# Patient Record
Sex: Female | Born: 1945 | Race: Black or African American | Hispanic: No | Smoking: Never smoker
Health system: Southern US, Community
[De-identification: ages and names within clinical notes are randomized; demographics above are authoritative.]

## PROBLEM LIST (undated history)

## (undated) DIAGNOSIS — M059 Rheumatoid arthritis with rheumatoid factor, unspecified: Secondary | ICD-10-CM

## (undated) DIAGNOSIS — M359 Systemic involvement of connective tissue, unspecified: Secondary | ICD-10-CM

## (undated) DIAGNOSIS — M1990 Unspecified osteoarthritis, unspecified site: Secondary | ICD-10-CM

## (undated) DIAGNOSIS — I209 Angina pectoris, unspecified: Secondary | ICD-10-CM

## (undated) DIAGNOSIS — Z9889 Other specified postprocedural states: Secondary | ICD-10-CM

## (undated) DIAGNOSIS — R0600 Dyspnea, unspecified: Secondary | ICD-10-CM

## (undated) DIAGNOSIS — E785 Hyperlipidemia, unspecified: Secondary | ICD-10-CM

## (undated) DIAGNOSIS — F419 Anxiety disorder, unspecified: Secondary | ICD-10-CM

## (undated) DIAGNOSIS — K219 Gastro-esophageal reflux disease without esophagitis: Secondary | ICD-10-CM

## (undated) DIAGNOSIS — R0601 Orthopnea: Secondary | ICD-10-CM

## (undated) DIAGNOSIS — I2510 Atherosclerotic heart disease of native coronary artery without angina pectoris: Secondary | ICD-10-CM

## (undated) DIAGNOSIS — Z90710 Acquired absence of both cervix and uterus: Secondary | ICD-10-CM

## (undated) DIAGNOSIS — I639 Cerebral infarction, unspecified: Secondary | ICD-10-CM

## (undated) DIAGNOSIS — Z87442 Personal history of urinary calculi: Secondary | ICD-10-CM

## (undated) DIAGNOSIS — E119 Type 2 diabetes mellitus without complications: Secondary | ICD-10-CM

## (undated) DIAGNOSIS — M545 Low back pain, unspecified: Secondary | ICD-10-CM

## (undated) DIAGNOSIS — C50919 Malignant neoplasm of unspecified site of unspecified female breast: Secondary | ICD-10-CM

## (undated) DIAGNOSIS — R011 Cardiac murmur, unspecified: Secondary | ICD-10-CM

## (undated) DIAGNOSIS — I10 Essential (primary) hypertension: Secondary | ICD-10-CM

## (undated) DIAGNOSIS — J349 Unspecified disorder of nose and nasal sinuses: Secondary | ICD-10-CM

## (undated) DIAGNOSIS — I219 Acute myocardial infarction, unspecified: Secondary | ICD-10-CM

## (undated) DIAGNOSIS — Z972 Presence of dental prosthetic device (complete) (partial): Secondary | ICD-10-CM

## (undated) DIAGNOSIS — Z9221 Personal history of antineoplastic chemotherapy: Secondary | ICD-10-CM

## (undated) HISTORY — PX: EYE SURGERY: SHX253

## (undated) HISTORY — PX: KNEE ARTHROSCOPY W/ MENISCAL REPAIR: SHX1877

## (undated) HISTORY — PX: ABDOMINAL HYSTERECTOMY: SHX81

## (undated) HISTORY — PX: JOINT REPLACEMENT: SHX530

## (undated) HISTORY — PX: COLONOSCOPY: SHX174

## (undated) HISTORY — PX: BREAST SURGERY: SHX581

## (undated) HISTORY — PX: CORONARY ANGIOPLASTY: SHX604

## (undated) HISTORY — PX: OTHER SURGICAL HISTORY: SHX169

---

## 1998-04-07 DIAGNOSIS — Z9221 Personal history of antineoplastic chemotherapy: Secondary | ICD-10-CM

## 1998-04-07 DIAGNOSIS — C50919 Malignant neoplasm of unspecified site of unspecified female breast: Secondary | ICD-10-CM

## 1998-04-07 HISTORY — PX: MASTECTOMY: SHX3

## 1998-04-07 HISTORY — DX: Malignant neoplasm of unspecified site of unspecified female breast: C50.919

## 1998-04-07 HISTORY — DX: Personal history of antineoplastic chemotherapy: Z92.21

## 2004-04-07 DIAGNOSIS — I639 Cerebral infarction, unspecified: Secondary | ICD-10-CM

## 2004-04-07 HISTORY — DX: Cerebral infarction, unspecified: I63.9

## 2005-07-17 ENCOUNTER — Ambulatory Visit: Payer: Self-pay | Admitting: Urology

## 2005-07-31 ENCOUNTER — Ambulatory Visit: Payer: Self-pay | Admitting: Internal Medicine

## 2005-08-21 ENCOUNTER — Ambulatory Visit: Payer: Self-pay | Admitting: Urology

## 2005-09-03 ENCOUNTER — Ambulatory Visit: Payer: Self-pay | Admitting: Urology

## 2005-09-23 ENCOUNTER — Ambulatory Visit: Payer: Self-pay | Admitting: Internal Medicine

## 2005-11-17 ENCOUNTER — Ambulatory Visit: Payer: Self-pay | Admitting: Urology

## 2006-05-03 ENCOUNTER — Ambulatory Visit: Payer: Self-pay | Admitting: Cardiology

## 2006-05-03 ENCOUNTER — Inpatient Hospital Stay (HOSPITAL_COMMUNITY): Admission: EM | Admit: 2006-05-03 | Discharge: 2006-05-06 | Payer: Self-pay | Admitting: Emergency Medicine

## 2006-05-08 ENCOUNTER — Ambulatory Visit: Payer: Self-pay | Admitting: Cardiovascular Disease

## 2006-08-14 ENCOUNTER — Ambulatory Visit: Payer: Self-pay | Admitting: Internal Medicine

## 2006-09-25 ENCOUNTER — Ambulatory Visit: Payer: Self-pay | Admitting: Internal Medicine

## 2006-11-12 ENCOUNTER — Ambulatory Visit: Payer: Self-pay | Admitting: Gastroenterology

## 2007-09-27 ENCOUNTER — Ambulatory Visit: Payer: Self-pay | Admitting: Internal Medicine

## 2007-10-07 ENCOUNTER — Ambulatory Visit: Payer: Self-pay | Admitting: Urology

## 2007-10-13 ENCOUNTER — Ambulatory Visit: Payer: Self-pay | Admitting: Urology

## 2008-07-26 ENCOUNTER — Ambulatory Visit: Payer: Self-pay | Admitting: Urology

## 2008-09-27 ENCOUNTER — Ambulatory Visit: Payer: Self-pay | Admitting: Internal Medicine

## 2009-02-08 ENCOUNTER — Ambulatory Visit: Payer: Self-pay | Admitting: Rheumatology

## 2009-09-28 ENCOUNTER — Ambulatory Visit: Payer: Self-pay | Admitting: Internal Medicine

## 2010-04-07 HISTORY — PX: CARDIAC CATHETERIZATION: SHX172

## 2010-07-07 DIAGNOSIS — I219 Acute myocardial infarction, unspecified: Secondary | ICD-10-CM

## 2010-07-07 HISTORY — DX: Acute myocardial infarction, unspecified: I21.9

## 2010-08-03 ENCOUNTER — Inpatient Hospital Stay: Payer: Self-pay | Admitting: Internal Medicine

## 2010-09-12 ENCOUNTER — Encounter: Payer: Self-pay | Admitting: Cardiology

## 2010-10-01 ENCOUNTER — Ambulatory Visit: Payer: Self-pay | Admitting: Internal Medicine

## 2010-10-06 ENCOUNTER — Encounter: Payer: Self-pay | Admitting: Cardiology

## 2010-11-05 ENCOUNTER — Ambulatory Visit: Payer: Self-pay | Admitting: Internal Medicine

## 2010-11-06 ENCOUNTER — Encounter: Payer: Self-pay | Admitting: Cardiology

## 2010-11-28 ENCOUNTER — Observation Stay: Payer: Self-pay | Admitting: Internal Medicine

## 2010-12-07 ENCOUNTER — Encounter: Payer: Self-pay | Admitting: Cardiology

## 2011-01-06 ENCOUNTER — Encounter: Payer: Self-pay | Admitting: Cardiology

## 2011-03-10 ENCOUNTER — Ambulatory Visit: Payer: Self-pay | Admitting: Internal Medicine

## 2011-10-08 ENCOUNTER — Ambulatory Visit: Payer: Self-pay | Admitting: Internal Medicine

## 2012-04-21 ENCOUNTER — Ambulatory Visit: Payer: Self-pay | Admitting: Gastroenterology

## 2012-04-22 LAB — PATHOLOGY REPORT

## 2012-05-18 ENCOUNTER — Emergency Department: Payer: Self-pay | Admitting: Emergency Medicine

## 2012-05-18 LAB — BASIC METABOLIC PANEL
Anion Gap: 8 (ref 7–16)
BUN: 9 mg/dL (ref 7–18)
Calcium, Total: 8.7 mg/dL (ref 8.5–10.1)
Glucose: 169 mg/dL — ABNORMAL HIGH (ref 65–99)
Osmolality: 273 (ref 275–301)
Potassium: 3.1 mmol/L — ABNORMAL LOW (ref 3.5–5.1)

## 2012-05-18 LAB — CBC
HCT: 35.6 % (ref 35.0–47.0)
MCH: 28.1 pg (ref 26.0–34.0)
MCHC: 35.2 g/dL (ref 32.0–36.0)
MCV: 80 fL (ref 80–100)
RDW: 13.7 % (ref 11.5–14.5)

## 2012-10-12 ENCOUNTER — Ambulatory Visit: Payer: Self-pay | Admitting: Internal Medicine

## 2012-12-09 ENCOUNTER — Ambulatory Visit: Payer: Self-pay | Admitting: Cardiology

## 2013-07-02 DIAGNOSIS — I10 Essential (primary) hypertension: Secondary | ICD-10-CM | POA: Insufficient documentation

## 2013-07-02 DIAGNOSIS — M76899 Other specified enthesopathies of unspecified lower limb, excluding foot: Secondary | ICD-10-CM | POA: Insufficient documentation

## 2013-07-02 DIAGNOSIS — M545 Low back pain, unspecified: Secondary | ICD-10-CM | POA: Insufficient documentation

## 2013-10-13 ENCOUNTER — Ambulatory Visit: Payer: Self-pay | Admitting: Internal Medicine

## 2013-11-16 DIAGNOSIS — R7611 Nonspecific reaction to tuberculin skin test without active tuberculosis: Secondary | ICD-10-CM | POA: Insufficient documentation

## 2013-11-16 DIAGNOSIS — M1990 Unspecified osteoarthritis, unspecified site: Secondary | ICD-10-CM | POA: Insufficient documentation

## 2014-03-14 DIAGNOSIS — I639 Cerebral infarction, unspecified: Secondary | ICD-10-CM | POA: Insufficient documentation

## 2014-04-24 DIAGNOSIS — J069 Acute upper respiratory infection, unspecified: Secondary | ICD-10-CM | POA: Diagnosis not present

## 2014-04-24 DIAGNOSIS — I10 Essential (primary) hypertension: Secondary | ICD-10-CM | POA: Diagnosis not present

## 2014-04-24 DIAGNOSIS — E119 Type 2 diabetes mellitus without complications: Secondary | ICD-10-CM | POA: Diagnosis not present

## 2014-04-24 DIAGNOSIS — M06072 Rheumatoid arthritis without rheumatoid factor, left ankle and foot: Secondary | ICD-10-CM | POA: Diagnosis not present

## 2014-05-09 DIAGNOSIS — E780 Pure hypercholesterolemia: Secondary | ICD-10-CM | POA: Diagnosis not present

## 2014-05-09 DIAGNOSIS — M150 Primary generalized (osteo)arthritis: Secondary | ICD-10-CM | POA: Diagnosis not present

## 2014-05-09 DIAGNOSIS — J0111 Acute recurrent frontal sinusitis: Secondary | ICD-10-CM | POA: Diagnosis not present

## 2014-05-16 DIAGNOSIS — R112 Nausea with vomiting, unspecified: Secondary | ICD-10-CM | POA: Diagnosis not present

## 2014-05-16 DIAGNOSIS — I10 Essential (primary) hypertension: Secondary | ICD-10-CM | POA: Diagnosis not present

## 2014-05-16 DIAGNOSIS — J0101 Acute recurrent maxillary sinusitis: Secondary | ICD-10-CM | POA: Diagnosis not present

## 2014-05-16 DIAGNOSIS — M150 Primary generalized (osteo)arthritis: Secondary | ICD-10-CM | POA: Diagnosis not present

## 2014-06-14 DIAGNOSIS — H01009 Unspecified blepharitis unspecified eye, unspecified eyelid: Secondary | ICD-10-CM | POA: Diagnosis not present

## 2014-06-14 DIAGNOSIS — H16223 Keratoconjunctivitis sicca, not specified as Sjogren's, bilateral: Secondary | ICD-10-CM | POA: Diagnosis not present

## 2014-07-08 DIAGNOSIS — R300 Dysuria: Secondary | ICD-10-CM | POA: Diagnosis not present

## 2014-07-08 DIAGNOSIS — N760 Acute vaginitis: Secondary | ICD-10-CM | POA: Diagnosis not present

## 2014-08-01 DIAGNOSIS — G8929 Other chronic pain: Secondary | ICD-10-CM | POA: Diagnosis not present

## 2014-08-01 DIAGNOSIS — E782 Mixed hyperlipidemia: Secondary | ICD-10-CM | POA: Diagnosis not present

## 2014-08-01 DIAGNOSIS — N898 Other specified noninflammatory disorders of vagina: Secondary | ICD-10-CM | POA: Diagnosis not present

## 2014-08-01 DIAGNOSIS — M25561 Pain in right knee: Secondary | ICD-10-CM | POA: Diagnosis not present

## 2014-08-01 DIAGNOSIS — I10 Essential (primary) hypertension: Secondary | ICD-10-CM | POA: Diagnosis not present

## 2014-08-31 DIAGNOSIS — Z0000 Encounter for general adult medical examination without abnormal findings: Secondary | ICD-10-CM | POA: Diagnosis not present

## 2014-08-31 DIAGNOSIS — R300 Dysuria: Secondary | ICD-10-CM | POA: Diagnosis not present

## 2014-08-31 DIAGNOSIS — I10 Essential (primary) hypertension: Secondary | ICD-10-CM | POA: Diagnosis not present

## 2014-08-31 DIAGNOSIS — E780 Pure hypercholesterolemia: Secondary | ICD-10-CM | POA: Diagnosis not present

## 2014-08-31 DIAGNOSIS — M150 Primary generalized (osteo)arthritis: Secondary | ICD-10-CM | POA: Diagnosis not present

## 2014-08-31 DIAGNOSIS — E119 Type 2 diabetes mellitus without complications: Secondary | ICD-10-CM | POA: Diagnosis not present

## 2014-08-31 DIAGNOSIS — J0111 Acute recurrent frontal sinusitis: Secondary | ICD-10-CM | POA: Diagnosis not present

## 2014-09-07 ENCOUNTER — Other Ambulatory Visit: Payer: Self-pay | Admitting: Internal Medicine

## 2014-09-07 DIAGNOSIS — N898 Other specified noninflammatory disorders of vagina: Secondary | ICD-10-CM | POA: Diagnosis not present

## 2014-09-07 DIAGNOSIS — R300 Dysuria: Secondary | ICD-10-CM | POA: Diagnosis not present

## 2014-09-07 DIAGNOSIS — Z0000 Encounter for general adult medical examination without abnormal findings: Secondary | ICD-10-CM | POA: Diagnosis not present

## 2014-09-07 DIAGNOSIS — I10 Essential (primary) hypertension: Secondary | ICD-10-CM | POA: Diagnosis not present

## 2014-09-07 DIAGNOSIS — Z1231 Encounter for screening mammogram for malignant neoplasm of breast: Secondary | ICD-10-CM

## 2014-09-07 DIAGNOSIS — M159 Polyosteoarthritis, unspecified: Secondary | ICD-10-CM | POA: Diagnosis not present

## 2014-09-14 DIAGNOSIS — Z780 Asymptomatic menopausal state: Secondary | ICD-10-CM | POA: Diagnosis not present

## 2014-09-14 DIAGNOSIS — I2510 Atherosclerotic heart disease of native coronary artery without angina pectoris: Secondary | ICD-10-CM | POA: Diagnosis not present

## 2014-09-14 DIAGNOSIS — R0602 Shortness of breath: Secondary | ICD-10-CM | POA: Diagnosis not present

## 2014-09-14 DIAGNOSIS — M159 Polyosteoarthritis, unspecified: Secondary | ICD-10-CM | POA: Diagnosis not present

## 2014-09-14 DIAGNOSIS — I10 Essential (primary) hypertension: Secondary | ICD-10-CM | POA: Diagnosis not present

## 2014-09-14 DIAGNOSIS — R079 Chest pain, unspecified: Secondary | ICD-10-CM | POA: Diagnosis not present

## 2014-09-28 DIAGNOSIS — R0602 Shortness of breath: Secondary | ICD-10-CM | POA: Diagnosis not present

## 2014-10-03 DIAGNOSIS — I2510 Atherosclerotic heart disease of native coronary artery without angina pectoris: Secondary | ICD-10-CM | POA: Diagnosis not present

## 2014-10-03 DIAGNOSIS — E780 Pure hypercholesterolemia: Secondary | ICD-10-CM | POA: Diagnosis not present

## 2014-10-03 DIAGNOSIS — I6310 Cerebral infarction due to embolism of unspecified precerebral artery: Secondary | ICD-10-CM | POA: Diagnosis not present

## 2014-10-03 DIAGNOSIS — I10 Essential (primary) hypertension: Secondary | ICD-10-CM | POA: Diagnosis not present

## 2014-10-05 DIAGNOSIS — R300 Dysuria: Secondary | ICD-10-CM | POA: Diagnosis not present

## 2014-10-16 ENCOUNTER — Ambulatory Visit
Admission: RE | Admit: 2014-10-16 | Discharge: 2014-10-16 | Disposition: A | Discharge status: Home / Self Care | Payer: Commercial Managed Care - HMO | Source: Ambulatory Visit | Attending: Internal Medicine | Admitting: Internal Medicine

## 2014-10-16 ENCOUNTER — Other Ambulatory Visit: Payer: Self-pay | Admitting: Internal Medicine

## 2014-10-16 DIAGNOSIS — Z1231 Encounter for screening mammogram for malignant neoplasm of breast: Secondary | ICD-10-CM

## 2014-10-16 HISTORY — DX: Malignant neoplasm of unspecified site of unspecified female breast: C50.919

## 2014-10-26 DIAGNOSIS — Z4431 Encounter for fitting and adjustment of external right breast prosthesis: Secondary | ICD-10-CM | POA: Diagnosis not present

## 2014-10-26 DIAGNOSIS — C50111 Malignant neoplasm of central portion of right female breast: Secondary | ICD-10-CM | POA: Diagnosis not present

## 2014-12-28 DIAGNOSIS — M069 Rheumatoid arthritis, unspecified: Secondary | ICD-10-CM | POA: Diagnosis not present

## 2014-12-28 DIAGNOSIS — M5440 Lumbago with sciatica, unspecified side: Secondary | ICD-10-CM | POA: Diagnosis not present

## 2014-12-28 DIAGNOSIS — I10 Essential (primary) hypertension: Secondary | ICD-10-CM | POA: Diagnosis not present

## 2014-12-28 DIAGNOSIS — E119 Type 2 diabetes mellitus without complications: Secondary | ICD-10-CM | POA: Diagnosis not present

## 2014-12-28 DIAGNOSIS — R102 Pelvic and perineal pain: Secondary | ICD-10-CM | POA: Diagnosis not present

## 2014-12-28 DIAGNOSIS — R300 Dysuria: Secondary | ICD-10-CM | POA: Diagnosis not present

## 2014-12-28 DIAGNOSIS — Z23 Encounter for immunization: Secondary | ICD-10-CM | POA: Diagnosis not present

## 2014-12-28 DIAGNOSIS — F419 Anxiety disorder, unspecified: Secondary | ICD-10-CM | POA: Diagnosis not present

## 2014-12-29 ENCOUNTER — Other Ambulatory Visit: Payer: Self-pay | Admitting: Internal Medicine

## 2014-12-29 DIAGNOSIS — R102 Pelvic and perineal pain: Secondary | ICD-10-CM

## 2014-12-29 DIAGNOSIS — R300 Dysuria: Secondary | ICD-10-CM

## 2014-12-29 DIAGNOSIS — M5441 Lumbago with sciatica, right side: Secondary | ICD-10-CM

## 2014-12-29 DIAGNOSIS — M5442 Lumbago with sciatica, left side: Secondary | ICD-10-CM

## 2015-01-02 ENCOUNTER — Encounter: Payer: Self-pay | Admitting: Emergency Medicine

## 2015-01-02 ENCOUNTER — Emergency Department
Admission: EM | Admit: 2015-01-02 | Discharge: 2015-01-02 | Disposition: A | Discharge status: Home / Self Care | Payer: Commercial Managed Care - HMO | Attending: Emergency Medicine | Admitting: Emergency Medicine

## 2015-01-02 ENCOUNTER — Ambulatory Visit: Payer: Commercial Managed Care - HMO

## 2015-01-02 ENCOUNTER — Emergency Department: Payer: Commercial Managed Care - HMO

## 2015-01-02 ENCOUNTER — Ambulatory Visit
Admission: RE | Admit: 2015-01-02 | Discharge: 2015-01-02 | Disposition: A | Discharge status: Home / Self Care | Payer: Commercial Managed Care - HMO | Source: Ambulatory Visit | Attending: Internal Medicine | Admitting: Internal Medicine

## 2015-01-02 DIAGNOSIS — R109 Unspecified abdominal pain: Secondary | ICD-10-CM | POA: Diagnosis not present

## 2015-01-02 DIAGNOSIS — N390 Urinary tract infection, site not specified: Secondary | ICD-10-CM | POA: Diagnosis not present

## 2015-01-02 DIAGNOSIS — I2510 Atherosclerotic heart disease of native coronary artery without angina pectoris: Secondary | ICD-10-CM

## 2015-01-02 DIAGNOSIS — M5441 Lumbago with sciatica, right side: Secondary | ICD-10-CM

## 2015-01-02 DIAGNOSIS — R300 Dysuria: Secondary | ICD-10-CM

## 2015-01-02 DIAGNOSIS — M549 Dorsalgia, unspecified: Secondary | ICD-10-CM | POA: Diagnosis present

## 2015-01-02 DIAGNOSIS — R102 Pelvic and perineal pain: Secondary | ICD-10-CM

## 2015-01-02 DIAGNOSIS — I10 Essential (primary) hypertension: Secondary | ICD-10-CM | POA: Diagnosis not present

## 2015-01-02 DIAGNOSIS — E119 Type 2 diabetes mellitus without complications: Secondary | ICD-10-CM | POA: Insufficient documentation

## 2015-01-02 DIAGNOSIS — K5730 Diverticulosis of large intestine without perforation or abscess without bleeding: Secondary | ICD-10-CM | POA: Insufficient documentation

## 2015-01-02 DIAGNOSIS — G8929 Other chronic pain: Secondary | ICD-10-CM | POA: Diagnosis not present

## 2015-01-02 DIAGNOSIS — N2889 Other specified disorders of kidney and ureter: Secondary | ICD-10-CM | POA: Insufficient documentation

## 2015-01-02 DIAGNOSIS — E876 Hypokalemia: Secondary | ICD-10-CM | POA: Diagnosis not present

## 2015-01-02 DIAGNOSIS — M5442 Lumbago with sciatica, left side: Secondary | ICD-10-CM

## 2015-01-02 DIAGNOSIS — E871 Hypo-osmolality and hyponatremia: Secondary | ICD-10-CM | POA: Insufficient documentation

## 2015-01-02 DIAGNOSIS — R252 Cramp and spasm: Secondary | ICD-10-CM | POA: Diagnosis not present

## 2015-01-02 HISTORY — DX: Type 2 diabetes mellitus without complications: E11.9

## 2015-01-02 HISTORY — DX: Essential (primary) hypertension: I10

## 2015-01-02 HISTORY — DX: Systemic involvement of connective tissue, unspecified: M35.9

## 2015-01-02 LAB — URINALYSIS COMPLETE WITH MICROSCOPIC (ARMC ONLY)
BILIRUBIN URINE: NEGATIVE
Bacteria, UA: NONE SEEN
HGB URINE DIPSTICK: NEGATIVE
Ketones, ur: NEGATIVE mg/dL
LEUKOCYTES UA: NEGATIVE
NITRITE: NEGATIVE
Protein, ur: NEGATIVE mg/dL
SPECIFIC GRAVITY, URINE: 1.058 — AB (ref 1.005–1.030)
pH: 6 (ref 5.0–8.0)

## 2015-01-02 LAB — CBC WITH DIFFERENTIAL/PLATELET
BASOS PCT: 1 %
Basophils Absolute: 0 10*3/uL (ref 0–0.1)
EOS ABS: 0 10*3/uL (ref 0–0.7)
Eosinophils Relative: 0 %
HCT: 36.7 % (ref 35.0–47.0)
HEMOGLOBIN: 13 g/dL (ref 12.0–16.0)
LYMPHS ABS: 0.7 10*3/uL — AB (ref 1.0–3.6)
Lymphocytes Relative: 11 %
MCH: 28.3 pg (ref 26.0–34.0)
MCHC: 35.4 g/dL (ref 32.0–36.0)
MCV: 80 fL (ref 80.0–100.0)
Monocytes Absolute: 0.1 10*3/uL — ABNORMAL LOW (ref 0.2–0.9)
Monocytes Relative: 1 %
NEUTROS PCT: 87 %
Neutro Abs: 5.6 10*3/uL (ref 1.4–6.5)
Platelets: 227 10*3/uL (ref 150–440)
RBC: 4.59 MIL/uL (ref 3.80–5.20)
RDW: 14.3 % (ref 11.5–14.5)
WBC: 6.4 10*3/uL (ref 3.6–11.0)

## 2015-01-02 LAB — BASIC METABOLIC PANEL
ANION GAP: 13 (ref 5–15)
BUN: 11 mg/dL (ref 6–20)
CALCIUM: 8.9 mg/dL (ref 8.9–10.3)
CO2: 25 mmol/L (ref 22–32)
Chloride: 88 mmol/L — ABNORMAL LOW (ref 101–111)
Creatinine, Ser: 0.69 mg/dL (ref 0.44–1.00)
Glucose, Bld: 270 mg/dL — ABNORMAL HIGH (ref 65–99)
Potassium: 3.1 mmol/L — ABNORMAL LOW (ref 3.5–5.1)
SODIUM: 126 mmol/L — AB (ref 135–145)

## 2015-01-02 LAB — APTT: aPTT: 31 seconds (ref 24–36)

## 2015-01-02 LAB — PROTIME-INR
INR: 1.1
PROTHROMBIN TIME: 14.4 s (ref 11.4–15.0)

## 2015-01-02 LAB — POCT I-STAT CREATININE: Creatinine, Ser: 0.7 mg/dL (ref 0.44–1.00)

## 2015-01-02 MED ORDER — SODIUM CHLORIDE 0.9 % IV BOLUS (SEPSIS)
1000.0000 mL | Freq: Once | INTRAVENOUS | Status: AC
  Administered 2015-01-02: 1000 mL via INTRAVENOUS

## 2015-01-02 MED ORDER — POTASSIUM CHLORIDE CRYS ER 20 MEQ PO TBCR
40.0000 meq | EXTENDED_RELEASE_TABLET | Freq: Once | ORAL | Status: AC
  Administered 2015-01-02: 40 meq via ORAL
  Filled 2015-01-02: qty 2

## 2015-01-02 MED ORDER — IOHEXOL 300 MG/ML  SOLN
100.0000 mL | Freq: Once | INTRAMUSCULAR | Status: AC | PRN
  Administered 2015-01-02: 100 mL via INTRAVENOUS

## 2015-01-02 NOTE — ED Provider Notes (Signed)
[facility] Emergency Department Provider Note  ____________________________________________  Time seen: Approximately 9:28 PM  I have reviewed the triage vital signs and the nursing notes.   HISTORY  Chief Complaint Back Pain and Dizziness    HPI MARCA GADSBY is a 69 y.o. female with no history of DVT or PE and no symptoms of DVT or PE who had a CT scan for chronic abdominal pain which showed no acute pathology but did suggest there was a possibility of a DVT. She was sent here for DVT workup.She has had no legs pain or leg swelling no chest pain or shortness of breath or personal or family history of PE or DVT and no complaints at this time. She is hungry  Past Medical History  Diagnosis Date  . Breast cancer 2000    Right Breast - Chemotherapy  . Collagen vascular disease     Rheumatoid Arthritis.  . Diabetes mellitus without complication     Patient takes Metformin  . Hypertension     There are no active problems to display for this patient.   Past Surgical History  Procedure Laterality Date  . Mastectomy Right 2000    No current outpatient prescriptions on file.  Allergies Fish allergy and Iodine  Family History  Problem Relation Age of Onset  . Breast cancer Maternal Aunt     Social History Social History  Substance Use Topics  . Smoking status: Never Smoker   . Smokeless tobacco: None  . Alcohol Use: No    Review of Systems Constitutional: No fever/chills Eyes: No visual changes. ENT: No sore throat. No stiff neck no neck pain Cardiovascular: Denies chest pain. Respiratory: Denies shortness of breath. Gastrointestinal:   no vomiting.  No diarrhea.  No constipation. Genitourinary: Negative for dysuria. Musculoskeletal: Negative lower extremity swelling Skin: Negative for rash. Neurological: Negative for headaches, focal weakness or numbness. 10-point ROS otherwise  negative.  ____________________________________________   PHYSICAL EXAM:  VITAL SIGNS: ED Triage Vitals  Enc Vitals Group     BP 01/02/15 1759 138/65 mmHg     Pulse Rate 01/02/15 1759 71     Resp 01/02/15 1759 18     Temp 01/02/15 1759 98.7 F (37.1 C)     Temp Source 01/02/15 1759 Oral     SpO2 01/02/15 1759 95 %     Weight 01/02/15 1759 177 lb (80.287 kg)     Height 01/02/15 1759 5\' 3"  (1.6 m)     Head Cir --      Peak Flow --      Pain Score 01/02/15 1803 6     Pain Loc --      Pain Edu? --      Excl. in [place]? --     Constitutional: Alert and oriented. Well appearing and in no acute distress. Eyes: Conjunctivae are normal. PERRL. EOMI. Head: Atraumatic. Nose: No congestion/rhinnorhea. Mouth/Throat: Mucous membranes are moist.  Oropharynx non-erythematous. Neck: No stridor.   Nontender with no meningismus Cardiovascular: Normal rate, regular rhythm. Grossly normal heart sounds.  Good peripheral circulation. Respiratory: Normal respiratory effort.  No retractions. Lungs CTAB. Gastrointestinal: Soft and nontender. No distention. No abdominal bruits. Back:  There is no focal tenderness or step off there is no midline tenderness there are no lesions noted. there is no CVA tenderness Musculoskeletal: No lower extremity tenderness. No joint effusions, no DVT signs strong distal pulses no edema Neurologic:  Normal speech and language. No gross focal neurologic deficits are appreciated.  Skin:  Skin is warm, dry and intact. No rash noted. Psychiatric: Mood and affect are normal. Speech and behavior are normal.  ____________________________________________   LABS (all labs ordered are listed, but only abnormal results are displayed)  Labs Reviewed  CBC WITH DIFFERENTIAL/PLATELET - Abnormal; Notable for the following:    Lymphs Abs 0.7 (*)    Monocytes Absolute 0.1 (*)    All other components within normal limits  BASIC METABOLIC PANEL - Abnormal; Notable for the following:     Sodium 126 (*)    Potassium 3.1 (*)    Chloride 88 (*)    Glucose, Bld 270 (*)    All other components within normal limits  URINALYSIS COMPLETEWITH MICROSCOPIC ([facility] ONLY) - Abnormal; Notable for the following:    Color, Urine YELLOW (*)    APPearance CLEAR (*)    Glucose, UA >500 (*)    Specific Gravity, Urine 1.058 (*)    Squamous Epithelial / LPF 0-5 (*)    All other components within normal limits  PROTIME-INR  APTT   ____________________________________________  EKG   ____________________________________________  RADIOLOGY   ____________________________________________   PROCEDURES  Procedure(s) performed: None  Critical Care performed: None  ____________________________________________   INITIAL IMPRESSION / ASSESSMENT AND PLAN / ED COURSE  Pertinent labs & imaging results that were available during my care of the patient were reviewed by me and considered in my medical decision making (see chart for details).  Patient sent here for DVT rule out that is negative however we did check blood work. Her sugar is somewhat elevated and she states she will manage status, however her sodium is 126, corrected for her glucose and it is 1:30 however which is really not dangerous. We will give her a liter of IV fluid and she understands she must follow closely with her doctor in the next day or 2 for a recheck. ____________________________________________   FINAL CLINICAL IMPRESSION(S) / ED DIAGNOSES  Final diagnoses:  None     Schuyler Amor, MD 01/02/15 2134

## 2015-01-02 NOTE — ED Notes (Signed)
Pt states lower back pain/ discomfort at this time, vitals wnl. No acute distress noted.

## 2015-01-02 NOTE — ED Notes (Signed)
Pt to ed with c/o back pain, dizziness and nausea.  Pt had CT scan of ABD today and was called by md and told to come to ED to rule out DVT.

## 2015-01-02 NOTE — Discharge Instructions (Signed)
Hypokalemia Hypokalemia means that the amount of potassium in the blood is lower than normal.Potassium is a chemical, called an electrolyte, that helps regulate the amount of fluid in the body. It also stimulates muscle contraction and helps nerves function properly.Most of the body's potassium is inside of cells, and only a very small amount is in the blood. Because the amount in the blood is so small, minor changes can be life-threatening. CAUSES  Antibiotics.  Diarrhea or vomiting.  Using laxatives too much, which can cause diarrhea.  Chronic kidney disease.  Water pills (diuretics).  Eating disorders (bulimia).  Low magnesium level.  Sweating a lot. SIGNS AND SYMPTOMS  Weakness.  Constipation.  Fatigue.  Muscle cramps.  Mental confusion.  Skipped heartbeats or irregular heartbeat (palpitations).  Tingling or numbness. DIAGNOSIS  Your health care provider can diagnose hypokalemia with blood tests. In addition to checking your potassium level, your health care provider may also check other lab tests. TREATMENT Hypokalemia can be treated with potassium supplements taken by mouth or adjustments in your current medicines. If your potassium level is very low, you may need to get potassium through a vein (IV) and be monitored in the hospital. A diet high in potassium is also helpful. Foods high in potassium are:  Nuts, such as peanuts and pistachios.  Seeds, such as sunflower seeds and pumpkin seeds.  Peas, lentils, and lima beans.  Whole grain and bran cereals and breads.  Fresh fruit and vegetables, such as apricots, avocado, bananas, cantaloupe, kiwi, oranges, tomatoes, asparagus, and potatoes.  Orange and tomato juices.  Red meats.  Fruit yogurt. HOME CARE INSTRUCTIONS  Take all medicines as prescribed by your health care provider.  Maintain a healthy diet by including nutritious food, such as fruits, vegetables, nuts, whole grains, and lean meats.  If  you are taking a laxative, be sure to follow the directions on the label. SEEK MEDICAL CARE IF:  Your weakness gets worse.  You feel your heart pounding or racing.  You are vomiting or having diarrhea.  You are diabetic and having trouble keeping your blood glucose in the normal range. SEEK IMMEDIATE MEDICAL CARE IF:  You have chest pain, shortness of breath, or dizziness.  You are vomiting or having diarrhea for more than 2 days.  You faint. MAKE SURE YOU:   Understand these instructions.  Will watch your condition.  Will get help right away if you are not doing well or get worse. Document Released: 03/24/2005 Document Revised: 01/12/2013 Document Reviewed: 09/24/2012 [facility] Patient Information 2015 [place], [place]. This information is not intended to replace advice given to you by your health care provider. Make sure you discuss any questions you have with your health care provider.  Hyponatremia  Hyponatremia is when the amount of salt (sodium) in your blood is too low. When sodium levels are low, your cells will absorb extra water and swell. The swelling happens throughout the body, but it mostly affects the brain. Severe brain swelling (cerebral edema), seizures, or coma can happen.  CAUSES   Heart, kidney, or liver problems.  Thyroid problems.  Adrenal gland problems.  Severe vomiting and diarrhea.  Certain medicines or illegal drugs.  Dehydration.  Drinking too much water.  Low-sodium diet. SYMPTOMS   Nausea and vomiting.  Confusion.  Lethargy.  Agitation.  Headache.  Twitching or shaking (seizures).  Unconsciousness.  Appetite loss.  Muscle weakness and cramping. DIAGNOSIS  Hyponatremia is identified by a simple blood test. Your caregiver will perform a history  and physical exam to try to find the cause and type of hyponatremia. Other tests may be needed to measure the amount of sodium in your blood and urine. TREATMENT  Treatment will  depend on the cause.   Fluids may be given through the vein (IV).  Medicines may be used to correct the sodium imbalance. If medicines are causing the problem, they will need to be adjusted.  Water or fluid intake may be restricted to restore proper balance. The speed of correcting the sodium problem is very important. If the problem is corrected too fast, nerve damage (sometimes unchangeable) can happen. HOME CARE INSTRUCTIONS   Only take medicines as directed by your caregiver. Many medicines can make hyponatremia worse. Discuss all your medicines with your caregiver.  Carefully follow any recommended diet, including any fluid restrictions.  You may be asked to repeat lab tests. Follow these directions.  Avoid alcohol and recreational drugs. SEEK MEDICAL CARE IF:   You develop worsening nausea, fatigue, headache, confusion, or weakness.  Your original hyponatremia symptoms return.  You have problems following the recommended diet. SEEK IMMEDIATE MEDICAL CARE IF:   You have a seizure.  You faint.  You have ongoing diarrhea or vomiting. MAKE SURE YOU:   Understand these instructions.  Will watch your condition.  Will get help right away if you are not doing well or get worse. Document Released: 03/14/2002 Document Revised: 06/16/2011 Document Reviewed: 09/08/2010 [facility] Patient Information 2015 [place], [place]. This information is not intended to replace advice given to you by your health care provider. Make sure you discuss any questions you have with your health care provider.

## 2015-01-03 DIAGNOSIS — E780 Pure hypercholesterolemia: Secondary | ICD-10-CM | POA: Diagnosis not present

## 2015-01-03 DIAGNOSIS — I10 Essential (primary) hypertension: Secondary | ICD-10-CM | POA: Diagnosis not present

## 2015-01-03 DIAGNOSIS — I2510 Atherosclerotic heart disease of native coronary artery without angina pectoris: Secondary | ICD-10-CM | POA: Diagnosis not present

## 2015-01-03 DIAGNOSIS — I6310 Cerebral infarction due to embolism of unspecified precerebral artery: Secondary | ICD-10-CM | POA: Diagnosis not present

## 2015-02-06 DIAGNOSIS — M25522 Pain in left elbow: Secondary | ICD-10-CM | POA: Diagnosis not present

## 2015-02-06 DIAGNOSIS — E119 Type 2 diabetes mellitus without complications: Secondary | ICD-10-CM | POA: Diagnosis not present

## 2015-02-06 DIAGNOSIS — I10 Essential (primary) hypertension: Secondary | ICD-10-CM | POA: Diagnosis not present

## 2015-02-21 DIAGNOSIS — H16223 Keratoconjunctivitis sicca, not specified as Sjogren's, bilateral: Secondary | ICD-10-CM | POA: Diagnosis not present

## 2015-03-28 DIAGNOSIS — M1990 Unspecified osteoarthritis, unspecified site: Secondary | ICD-10-CM | POA: Diagnosis not present

## 2015-04-26 DIAGNOSIS — M9901 Segmental and somatic dysfunction of cervical region: Secondary | ICD-10-CM | POA: Diagnosis not present

## 2015-04-26 DIAGNOSIS — M531 Cervicobrachial syndrome: Secondary | ICD-10-CM | POA: Diagnosis not present

## 2015-04-26 DIAGNOSIS — M9902 Segmental and somatic dysfunction of thoracic region: Secondary | ICD-10-CM | POA: Diagnosis not present

## 2015-04-26 DIAGNOSIS — M5416 Radiculopathy, lumbar region: Secondary | ICD-10-CM | POA: Diagnosis not present

## 2015-05-01 DIAGNOSIS — M531 Cervicobrachial syndrome: Secondary | ICD-10-CM | POA: Diagnosis not present

## 2015-05-01 DIAGNOSIS — M9902 Segmental and somatic dysfunction of thoracic region: Secondary | ICD-10-CM | POA: Diagnosis not present

## 2015-05-01 DIAGNOSIS — M5416 Radiculopathy, lumbar region: Secondary | ICD-10-CM | POA: Diagnosis not present

## 2015-05-01 DIAGNOSIS — M9901 Segmental and somatic dysfunction of cervical region: Secondary | ICD-10-CM | POA: Diagnosis not present

## 2015-05-03 DIAGNOSIS — M05721 Rheumatoid arthritis with rheumatoid factor of right elbow without organ or systems involvement: Secondary | ICD-10-CM | POA: Diagnosis not present

## 2015-05-03 DIAGNOSIS — M05722 Rheumatoid arthritis with rheumatoid factor of left elbow without organ or systems involvement: Secondary | ICD-10-CM | POA: Insufficient documentation

## 2015-05-03 DIAGNOSIS — M9901 Segmental and somatic dysfunction of cervical region: Secondary | ICD-10-CM | POA: Diagnosis not present

## 2015-05-03 DIAGNOSIS — E118 Type 2 diabetes mellitus with unspecified complications: Secondary | ICD-10-CM | POA: Diagnosis not present

## 2015-05-03 DIAGNOSIS — M531 Cervicobrachial syndrome: Secondary | ICD-10-CM | POA: Diagnosis not present

## 2015-05-03 DIAGNOSIS — M791 Myalgia: Secondary | ICD-10-CM | POA: Diagnosis not present

## 2015-05-03 DIAGNOSIS — I10 Essential (primary) hypertension: Secondary | ICD-10-CM | POA: Diagnosis not present

## 2015-05-03 DIAGNOSIS — M9902 Segmental and somatic dysfunction of thoracic region: Secondary | ICD-10-CM | POA: Diagnosis not present

## 2015-05-03 DIAGNOSIS — E7800 Pure hypercholesterolemia, unspecified: Secondary | ICD-10-CM | POA: Diagnosis not present

## 2015-05-03 DIAGNOSIS — E119 Type 2 diabetes mellitus without complications: Secondary | ICD-10-CM | POA: Diagnosis not present

## 2015-05-03 DIAGNOSIS — E785 Hyperlipidemia, unspecified: Secondary | ICD-10-CM | POA: Diagnosis not present

## 2015-05-03 DIAGNOSIS — M5416 Radiculopathy, lumbar region: Secondary | ICD-10-CM | POA: Diagnosis not present

## 2015-06-04 DIAGNOSIS — Z79899 Other long term (current) drug therapy: Secondary | ICD-10-CM | POA: Diagnosis not present

## 2015-06-07 DIAGNOSIS — M531 Cervicobrachial syndrome: Secondary | ICD-10-CM | POA: Diagnosis not present

## 2015-06-07 DIAGNOSIS — M5416 Radiculopathy, lumbar region: Secondary | ICD-10-CM | POA: Diagnosis not present

## 2015-06-07 DIAGNOSIS — M9902 Segmental and somatic dysfunction of thoracic region: Secondary | ICD-10-CM | POA: Diagnosis not present

## 2015-06-07 DIAGNOSIS — M9901 Segmental and somatic dysfunction of cervical region: Secondary | ICD-10-CM | POA: Diagnosis not present

## 2015-06-15 DIAGNOSIS — H16223 Keratoconjunctivitis sicca, not specified as Sjogren's, bilateral: Secondary | ICD-10-CM | POA: Diagnosis not present

## 2015-06-29 ENCOUNTER — Other Ambulatory Visit: Payer: Self-pay | Admitting: Internal Medicine

## 2015-06-29 DIAGNOSIS — M25522 Pain in left elbow: Secondary | ICD-10-CM | POA: Diagnosis not present

## 2015-06-29 DIAGNOSIS — M05722 Rheumatoid arthritis with rheumatoid factor of left elbow without organ or systems involvement: Secondary | ICD-10-CM | POA: Diagnosis not present

## 2015-06-29 DIAGNOSIS — H16223 Keratoconjunctivitis sicca, not specified as Sjogren's, bilateral: Secondary | ICD-10-CM | POA: Diagnosis not present

## 2015-06-29 DIAGNOSIS — M05721 Rheumatoid arthritis with rheumatoid factor of right elbow without organ or systems involvement: Secondary | ICD-10-CM | POA: Diagnosis not present

## 2015-06-29 DIAGNOSIS — M150 Primary generalized (osteo)arthritis: Secondary | ICD-10-CM | POA: Diagnosis not present

## 2015-06-29 DIAGNOSIS — M25422 Effusion, left elbow: Secondary | ICD-10-CM

## 2015-06-29 DIAGNOSIS — I10 Essential (primary) hypertension: Secondary | ICD-10-CM | POA: Diagnosis not present

## 2015-06-29 DIAGNOSIS — E7800 Pure hypercholesterolemia, unspecified: Secondary | ICD-10-CM | POA: Diagnosis not present

## 2015-07-05 ENCOUNTER — Other Ambulatory Visit: Payer: Self-pay

## 2015-07-05 ENCOUNTER — Ambulatory Visit
Admission: RE | Admit: 2015-07-05 | Discharge: 2015-07-05 | Disposition: A | Discharge status: Home / Self Care | Payer: Commercial Managed Care - HMO | Source: Ambulatory Visit | Attending: Internal Medicine | Admitting: Internal Medicine

## 2015-07-05 DIAGNOSIS — M150 Primary generalized (osteo)arthritis: Secondary | ICD-10-CM | POA: Insufficient documentation

## 2015-07-05 DIAGNOSIS — M05721 Rheumatoid arthritis with rheumatoid factor of right elbow without organ or systems involvement: Secondary | ICD-10-CM | POA: Diagnosis present

## 2015-07-05 DIAGNOSIS — M25422 Effusion, left elbow: Secondary | ICD-10-CM | POA: Insufficient documentation

## 2015-07-05 DIAGNOSIS — M05722 Rheumatoid arthritis with rheumatoid factor of left elbow without organ or systems involvement: Secondary | ICD-10-CM | POA: Insufficient documentation

## 2015-07-05 DIAGNOSIS — M25522 Pain in left elbow: Secondary | ICD-10-CM | POA: Diagnosis not present

## 2015-07-05 DIAGNOSIS — M7989 Other specified soft tissue disorders: Secondary | ICD-10-CM | POA: Diagnosis not present

## 2015-07-13 ENCOUNTER — Ambulatory Visit
Admission: RE | Admit: 2015-07-13 | Discharge: 2015-07-13 | Disposition: A | Discharge status: Home / Self Care | Payer: Commercial Managed Care - HMO | Source: Ambulatory Visit | Attending: Internal Medicine | Admitting: Internal Medicine

## 2015-07-13 ENCOUNTER — Other Ambulatory Visit: Payer: Self-pay | Admitting: Internal Medicine

## 2015-07-13 DIAGNOSIS — M069 Rheumatoid arthritis, unspecified: Secondary | ICD-10-CM

## 2015-07-13 DIAGNOSIS — M05722 Rheumatoid arthritis with rheumatoid factor of left elbow without organ or systems involvement: Secondary | ICD-10-CM | POA: Diagnosis not present

## 2015-07-13 DIAGNOSIS — I2510 Atherosclerotic heart disease of native coronary artery without angina pectoris: Secondary | ICD-10-CM | POA: Diagnosis not present

## 2015-07-13 DIAGNOSIS — M25422 Effusion, left elbow: Secondary | ICD-10-CM | POA: Diagnosis not present

## 2015-07-13 DIAGNOSIS — M659 Synovitis and tenosynovitis, unspecified: Secondary | ICD-10-CM | POA: Diagnosis not present

## 2015-07-13 DIAGNOSIS — M05721 Rheumatoid arthritis with rheumatoid factor of right elbow without organ or systems involvement: Secondary | ICD-10-CM | POA: Diagnosis not present

## 2015-07-13 DIAGNOSIS — M150 Primary generalized (osteo)arthritis: Secondary | ICD-10-CM | POA: Diagnosis not present

## 2015-07-13 DIAGNOSIS — R7611 Nonspecific reaction to tuberculin skin test without active tuberculosis: Secondary | ICD-10-CM | POA: Diagnosis not present

## 2015-07-13 DIAGNOSIS — M0579 Rheumatoid arthritis with rheumatoid factor of multiple sites without organ or systems involvement: Secondary | ICD-10-CM | POA: Diagnosis not present

## 2015-07-13 DIAGNOSIS — I10 Essential (primary) hypertension: Secondary | ICD-10-CM | POA: Diagnosis not present

## 2015-07-13 DIAGNOSIS — E7800 Pure hypercholesterolemia, unspecified: Secondary | ICD-10-CM | POA: Diagnosis not present

## 2015-07-13 LAB — SYNOVIAL CELL COUNT + DIFF, W/ CRYSTALS
EOSINOPHILS-SYNOVIAL: 2 %
Lymphocytes-Synovial Fld: 26 %
MONOCYTE-MACROPHAGE-SYNOVIAL FLUID: 4 %
Neutrophil, Synovial: 68 %
OTHER CELLS-SYN: 0
WBC, SYNOVIAL: 19 /mm3 (ref 0–200)

## 2015-07-17 LAB — BODY FLUID CULTURE: Culture: NO GROWTH

## 2015-07-25 DIAGNOSIS — E119 Type 2 diabetes mellitus without complications: Secondary | ICD-10-CM | POA: Diagnosis not present

## 2015-07-25 DIAGNOSIS — M05722 Rheumatoid arthritis with rheumatoid factor of left elbow without organ or systems involvement: Secondary | ICD-10-CM | POA: Diagnosis not present

## 2015-07-25 DIAGNOSIS — E7800 Pure hypercholesterolemia, unspecified: Secondary | ICD-10-CM | POA: Diagnosis not present

## 2015-07-25 DIAGNOSIS — I10 Essential (primary) hypertension: Secondary | ICD-10-CM | POA: Diagnosis not present

## 2015-07-25 DIAGNOSIS — M791 Myalgia: Secondary | ICD-10-CM | POA: Diagnosis not present

## 2015-07-25 DIAGNOSIS — M150 Primary generalized (osteo)arthritis: Secondary | ICD-10-CM | POA: Diagnosis not present

## 2015-07-25 DIAGNOSIS — M05721 Rheumatoid arthritis with rheumatoid factor of right elbow without organ or systems involvement: Secondary | ICD-10-CM | POA: Diagnosis not present

## 2015-08-01 DIAGNOSIS — I2510 Atherosclerotic heart disease of native coronary artery without angina pectoris: Secondary | ICD-10-CM | POA: Diagnosis not present

## 2015-08-01 DIAGNOSIS — G8929 Other chronic pain: Secondary | ICD-10-CM | POA: Diagnosis not present

## 2015-08-01 DIAGNOSIS — M545 Low back pain: Secondary | ICD-10-CM | POA: Diagnosis not present

## 2015-08-01 DIAGNOSIS — M05722 Rheumatoid arthritis with rheumatoid factor of left elbow without organ or systems involvement: Secondary | ICD-10-CM | POA: Diagnosis not present

## 2015-08-01 DIAGNOSIS — I10 Essential (primary) hypertension: Secondary | ICD-10-CM | POA: Diagnosis not present

## 2015-08-01 DIAGNOSIS — M9901 Segmental and somatic dysfunction of cervical region: Secondary | ICD-10-CM | POA: Diagnosis not present

## 2015-08-01 DIAGNOSIS — M531 Cervicobrachial syndrome: Secondary | ICD-10-CM | POA: Diagnosis not present

## 2015-08-01 DIAGNOSIS — M5416 Radiculopathy, lumbar region: Secondary | ICD-10-CM | POA: Diagnosis not present

## 2015-08-01 DIAGNOSIS — M05721 Rheumatoid arthritis with rheumatoid factor of right elbow without organ or systems involvement: Secondary | ICD-10-CM | POA: Diagnosis not present

## 2015-08-01 DIAGNOSIS — E119 Type 2 diabetes mellitus without complications: Secondary | ICD-10-CM | POA: Diagnosis not present

## 2015-08-01 DIAGNOSIS — M25522 Pain in left elbow: Secondary | ICD-10-CM | POA: Diagnosis not present

## 2015-08-01 DIAGNOSIS — M9902 Segmental and somatic dysfunction of thoracic region: Secondary | ICD-10-CM | POA: Diagnosis not present

## 2015-08-01 DIAGNOSIS — M150 Primary generalized (osteo)arthritis: Secondary | ICD-10-CM | POA: Diagnosis not present

## 2015-08-30 DIAGNOSIS — M059 Rheumatoid arthritis with rheumatoid factor, unspecified: Secondary | ICD-10-CM | POA: Diagnosis not present

## 2015-08-30 DIAGNOSIS — Z79899 Other long term (current) drug therapy: Secondary | ICD-10-CM | POA: Diagnosis not present

## 2015-09-07 ENCOUNTER — Other Ambulatory Visit: Payer: Self-pay | Admitting: Internal Medicine

## 2015-09-07 DIAGNOSIS — Z1231 Encounter for screening mammogram for malignant neoplasm of breast: Secondary | ICD-10-CM

## 2015-10-03 DIAGNOSIS — M059 Rheumatoid arthritis with rheumatoid factor, unspecified: Secondary | ICD-10-CM | POA: Diagnosis not present

## 2015-10-03 DIAGNOSIS — Z79899 Other long term (current) drug therapy: Secondary | ICD-10-CM | POA: Diagnosis not present

## 2015-10-15 DIAGNOSIS — M25522 Pain in left elbow: Secondary | ICD-10-CM | POA: Diagnosis not present

## 2015-10-15 DIAGNOSIS — R300 Dysuria: Secondary | ICD-10-CM | POA: Diagnosis not present

## 2015-10-15 DIAGNOSIS — E119 Type 2 diabetes mellitus without complications: Secondary | ICD-10-CM | POA: Diagnosis not present

## 2015-10-15 DIAGNOSIS — M150 Primary generalized (osteo)arthritis: Secondary | ICD-10-CM | POA: Diagnosis not present

## 2015-10-15 DIAGNOSIS — G8929 Other chronic pain: Secondary | ICD-10-CM | POA: Diagnosis not present

## 2015-10-15 DIAGNOSIS — I10 Essential (primary) hypertension: Secondary | ICD-10-CM | POA: Diagnosis not present

## 2015-10-15 DIAGNOSIS — M05721 Rheumatoid arthritis with rheumatoid factor of right elbow without organ or systems involvement: Secondary | ICD-10-CM | POA: Diagnosis not present

## 2015-10-15 DIAGNOSIS — Z0000 Encounter for general adult medical examination without abnormal findings: Secondary | ICD-10-CM | POA: Diagnosis not present

## 2015-10-15 DIAGNOSIS — E7800 Pure hypercholesterolemia, unspecified: Secondary | ICD-10-CM | POA: Diagnosis not present

## 2015-10-15 DIAGNOSIS — M05722 Rheumatoid arthritis with rheumatoid factor of left elbow without organ or systems involvement: Secondary | ICD-10-CM | POA: Diagnosis not present

## 2015-10-15 DIAGNOSIS — M545 Low back pain: Secondary | ICD-10-CM | POA: Diagnosis not present

## 2015-10-15 DIAGNOSIS — I2510 Atherosclerotic heart disease of native coronary artery without angina pectoris: Secondary | ICD-10-CM | POA: Diagnosis not present

## 2015-10-18 ENCOUNTER — Other Ambulatory Visit: Payer: Self-pay | Admitting: Internal Medicine

## 2015-10-18 ENCOUNTER — Ambulatory Visit
Admission: RE | Admit: 2015-10-18 | Discharge: 2015-10-18 | Disposition: A | Discharge status: Home / Self Care | Payer: Commercial Managed Care - HMO | Source: Ambulatory Visit | Attending: Internal Medicine | Admitting: Internal Medicine

## 2015-10-18 DIAGNOSIS — Z1231 Encounter for screening mammogram for malignant neoplasm of breast: Secondary | ICD-10-CM | POA: Insufficient documentation

## 2015-10-18 DIAGNOSIS — M531 Cervicobrachial syndrome: Secondary | ICD-10-CM | POA: Diagnosis not present

## 2015-10-18 DIAGNOSIS — M9902 Segmental and somatic dysfunction of thoracic region: Secondary | ICD-10-CM | POA: Diagnosis not present

## 2015-10-18 DIAGNOSIS — M9901 Segmental and somatic dysfunction of cervical region: Secondary | ICD-10-CM | POA: Diagnosis not present

## 2015-10-18 DIAGNOSIS — M5416 Radiculopathy, lumbar region: Secondary | ICD-10-CM | POA: Diagnosis not present

## 2015-10-24 DIAGNOSIS — R200 Anesthesia of skin: Secondary | ICD-10-CM | POA: Diagnosis not present

## 2015-10-24 DIAGNOSIS — M05721 Rheumatoid arthritis with rheumatoid factor of right elbow without organ or systems involvement: Secondary | ICD-10-CM | POA: Diagnosis not present

## 2015-10-24 DIAGNOSIS — R202 Paresthesia of skin: Secondary | ICD-10-CM | POA: Diagnosis not present

## 2015-10-24 DIAGNOSIS — M059 Rheumatoid arthritis with rheumatoid factor, unspecified: Secondary | ICD-10-CM | POA: Insufficient documentation

## 2015-10-24 DIAGNOSIS — M05722 Rheumatoid arthritis with rheumatoid factor of left elbow without organ or systems involvement: Secondary | ICD-10-CM | POA: Diagnosis not present

## 2015-10-24 DIAGNOSIS — M0579 Rheumatoid arthritis with rheumatoid factor of multiple sites without organ or systems involvement: Secondary | ICD-10-CM | POA: Diagnosis not present

## 2015-11-13 DIAGNOSIS — R309 Painful micturition, unspecified: Secondary | ICD-10-CM | POA: Diagnosis not present

## 2015-12-15 DIAGNOSIS — M9902 Segmental and somatic dysfunction of thoracic region: Secondary | ICD-10-CM | POA: Diagnosis not present

## 2015-12-15 DIAGNOSIS — M531 Cervicobrachial syndrome: Secondary | ICD-10-CM | POA: Diagnosis not present

## 2015-12-15 DIAGNOSIS — M5416 Radiculopathy, lumbar region: Secondary | ICD-10-CM | POA: Diagnosis not present

## 2015-12-15 DIAGNOSIS — M9901 Segmental and somatic dysfunction of cervical region: Secondary | ICD-10-CM | POA: Diagnosis not present

## 2015-12-19 DIAGNOSIS — M531 Cervicobrachial syndrome: Secondary | ICD-10-CM | POA: Diagnosis not present

## 2015-12-19 DIAGNOSIS — M9901 Segmental and somatic dysfunction of cervical region: Secondary | ICD-10-CM | POA: Diagnosis not present

## 2015-12-19 DIAGNOSIS — M9902 Segmental and somatic dysfunction of thoracic region: Secondary | ICD-10-CM | POA: Diagnosis not present

## 2015-12-19 DIAGNOSIS — M5416 Radiculopathy, lumbar region: Secondary | ICD-10-CM | POA: Diagnosis not present

## 2015-12-20 DIAGNOSIS — M531 Cervicobrachial syndrome: Secondary | ICD-10-CM | POA: Diagnosis not present

## 2015-12-20 DIAGNOSIS — M5416 Radiculopathy, lumbar region: Secondary | ICD-10-CM | POA: Diagnosis not present

## 2015-12-20 DIAGNOSIS — M9901 Segmental and somatic dysfunction of cervical region: Secondary | ICD-10-CM | POA: Diagnosis not present

## 2015-12-20 DIAGNOSIS — M9902 Segmental and somatic dysfunction of thoracic region: Secondary | ICD-10-CM | POA: Diagnosis not present

## 2015-12-24 DIAGNOSIS — M9902 Segmental and somatic dysfunction of thoracic region: Secondary | ICD-10-CM | POA: Diagnosis not present

## 2015-12-24 DIAGNOSIS — M9901 Segmental and somatic dysfunction of cervical region: Secondary | ICD-10-CM | POA: Diagnosis not present

## 2015-12-24 DIAGNOSIS — M531 Cervicobrachial syndrome: Secondary | ICD-10-CM | POA: Diagnosis not present

## 2015-12-24 DIAGNOSIS — M5416 Radiculopathy, lumbar region: Secondary | ICD-10-CM | POA: Diagnosis not present

## 2015-12-26 DIAGNOSIS — Z9181 History of falling: Secondary | ICD-10-CM | POA: Diagnosis not present

## 2015-12-26 DIAGNOSIS — M545 Low back pain: Secondary | ICD-10-CM | POA: Diagnosis not present

## 2015-12-26 DIAGNOSIS — N390 Urinary tract infection, site not specified: Secondary | ICD-10-CM | POA: Diagnosis not present

## 2015-12-26 DIAGNOSIS — R350 Frequency of micturition: Secondary | ICD-10-CM | POA: Diagnosis not present

## 2015-12-26 DIAGNOSIS — M05721 Rheumatoid arthritis with rheumatoid factor of right elbow without organ or systems involvement: Secondary | ICD-10-CM | POA: Diagnosis not present

## 2015-12-26 DIAGNOSIS — E119 Type 2 diabetes mellitus without complications: Secondary | ICD-10-CM | POA: Diagnosis not present

## 2015-12-26 DIAGNOSIS — R300 Dysuria: Secondary | ICD-10-CM | POA: Diagnosis not present

## 2015-12-26 DIAGNOSIS — M05722 Rheumatoid arthritis with rheumatoid factor of left elbow without organ or systems involvement: Secondary | ICD-10-CM | POA: Diagnosis not present

## 2015-12-26 DIAGNOSIS — I10 Essential (primary) hypertension: Secondary | ICD-10-CM | POA: Diagnosis not present

## 2015-12-31 DIAGNOSIS — H16223 Keratoconjunctivitis sicca, not specified as Sjogren's, bilateral: Secondary | ICD-10-CM | POA: Diagnosis not present

## 2016-01-10 DIAGNOSIS — R300 Dysuria: Secondary | ICD-10-CM | POA: Diagnosis not present

## 2016-01-10 DIAGNOSIS — M545 Low back pain: Secondary | ICD-10-CM | POA: Diagnosis not present

## 2016-01-24 DIAGNOSIS — Z79899 Other long term (current) drug therapy: Secondary | ICD-10-CM | POA: Insufficient documentation

## 2016-01-24 DIAGNOSIS — M05722 Rheumatoid arthritis with rheumatoid factor of left elbow without organ or systems involvement: Secondary | ICD-10-CM | POA: Diagnosis not present

## 2016-01-24 DIAGNOSIS — R7611 Nonspecific reaction to tuberculin skin test without active tuberculosis: Secondary | ICD-10-CM | POA: Diagnosis not present

## 2016-01-24 DIAGNOSIS — M05721 Rheumatoid arthritis with rheumatoid factor of right elbow without organ or systems involvement: Secondary | ICD-10-CM | POA: Diagnosis not present

## 2016-02-04 DIAGNOSIS — M5416 Radiculopathy, lumbar region: Secondary | ICD-10-CM | POA: Diagnosis not present

## 2016-02-04 DIAGNOSIS — M9901 Segmental and somatic dysfunction of cervical region: Secondary | ICD-10-CM | POA: Diagnosis not present

## 2016-02-04 DIAGNOSIS — M9902 Segmental and somatic dysfunction of thoracic region: Secondary | ICD-10-CM | POA: Diagnosis not present

## 2016-02-04 DIAGNOSIS — M531 Cervicobrachial syndrome: Secondary | ICD-10-CM | POA: Diagnosis not present

## 2016-02-07 DIAGNOSIS — E7800 Pure hypercholesterolemia, unspecified: Secondary | ICD-10-CM | POA: Diagnosis not present

## 2016-02-07 DIAGNOSIS — M531 Cervicobrachial syndrome: Secondary | ICD-10-CM | POA: Diagnosis not present

## 2016-02-07 DIAGNOSIS — M05722 Rheumatoid arthritis with rheumatoid factor of left elbow without organ or systems involvement: Secondary | ICD-10-CM | POA: Diagnosis not present

## 2016-02-07 DIAGNOSIS — M5416 Radiculopathy, lumbar region: Secondary | ICD-10-CM | POA: Diagnosis not present

## 2016-02-07 DIAGNOSIS — E119 Type 2 diabetes mellitus without complications: Secondary | ICD-10-CM | POA: Diagnosis not present

## 2016-02-07 DIAGNOSIS — I10 Essential (primary) hypertension: Secondary | ICD-10-CM | POA: Diagnosis not present

## 2016-02-07 DIAGNOSIS — M9902 Segmental and somatic dysfunction of thoracic region: Secondary | ICD-10-CM | POA: Diagnosis not present

## 2016-02-07 DIAGNOSIS — M9901 Segmental and somatic dysfunction of cervical region: Secondary | ICD-10-CM | POA: Diagnosis not present

## 2016-02-07 DIAGNOSIS — Z79899 Other long term (current) drug therapy: Secondary | ICD-10-CM | POA: Diagnosis not present

## 2016-02-07 DIAGNOSIS — R300 Dysuria: Secondary | ICD-10-CM | POA: Diagnosis not present

## 2016-02-07 DIAGNOSIS — M05721 Rheumatoid arthritis with rheumatoid factor of right elbow without organ or systems involvement: Secondary | ICD-10-CM | POA: Diagnosis not present

## 2016-02-08 DIAGNOSIS — R8290 Unspecified abnormal findings in urine: Secondary | ICD-10-CM | POA: Diagnosis not present

## 2016-02-08 DIAGNOSIS — R300 Dysuria: Secondary | ICD-10-CM | POA: Diagnosis not present

## 2016-02-11 DIAGNOSIS — M9902 Segmental and somatic dysfunction of thoracic region: Secondary | ICD-10-CM | POA: Diagnosis not present

## 2016-02-11 DIAGNOSIS — M5416 Radiculopathy, lumbar region: Secondary | ICD-10-CM | POA: Diagnosis not present

## 2016-02-11 DIAGNOSIS — M9901 Segmental and somatic dysfunction of cervical region: Secondary | ICD-10-CM | POA: Diagnosis not present

## 2016-02-11 DIAGNOSIS — M531 Cervicobrachial syndrome: Secondary | ICD-10-CM | POA: Diagnosis not present

## 2016-02-14 DIAGNOSIS — M531 Cervicobrachial syndrome: Secondary | ICD-10-CM | POA: Diagnosis not present

## 2016-02-14 DIAGNOSIS — N8110 Cystocele, unspecified: Secondary | ICD-10-CM | POA: Diagnosis not present

## 2016-02-14 DIAGNOSIS — M05721 Rheumatoid arthritis with rheumatoid factor of right elbow without organ or systems involvement: Secondary | ICD-10-CM | POA: Diagnosis not present

## 2016-02-14 DIAGNOSIS — N390 Urinary tract infection, site not specified: Secondary | ICD-10-CM | POA: Diagnosis not present

## 2016-02-14 DIAGNOSIS — E119 Type 2 diabetes mellitus without complications: Secondary | ICD-10-CM | POA: Diagnosis not present

## 2016-02-14 DIAGNOSIS — Z23 Encounter for immunization: Secondary | ICD-10-CM | POA: Diagnosis not present

## 2016-02-14 DIAGNOSIS — M5416 Radiculopathy, lumbar region: Secondary | ICD-10-CM | POA: Diagnosis not present

## 2016-02-14 DIAGNOSIS — E7800 Pure hypercholesterolemia, unspecified: Secondary | ICD-10-CM | POA: Diagnosis not present

## 2016-02-14 DIAGNOSIS — I10 Essential (primary) hypertension: Secondary | ICD-10-CM | POA: Diagnosis not present

## 2016-02-14 DIAGNOSIS — N343 Urethral syndrome, unspecified: Secondary | ICD-10-CM | POA: Diagnosis not present

## 2016-02-14 DIAGNOSIS — M9901 Segmental and somatic dysfunction of cervical region: Secondary | ICD-10-CM | POA: Diagnosis not present

## 2016-02-14 DIAGNOSIS — Z9181 History of falling: Secondary | ICD-10-CM | POA: Diagnosis not present

## 2016-02-14 DIAGNOSIS — M9902 Segmental and somatic dysfunction of thoracic region: Secondary | ICD-10-CM | POA: Diagnosis not present

## 2016-02-26 DIAGNOSIS — R102 Pelvic and perineal pain: Secondary | ICD-10-CM | POA: Diagnosis not present

## 2016-03-17 DIAGNOSIS — M5416 Radiculopathy, lumbar region: Secondary | ICD-10-CM | POA: Diagnosis not present

## 2016-03-17 DIAGNOSIS — M9901 Segmental and somatic dysfunction of cervical region: Secondary | ICD-10-CM | POA: Diagnosis not present

## 2016-03-17 DIAGNOSIS — M9902 Segmental and somatic dysfunction of thoracic region: Secondary | ICD-10-CM | POA: Diagnosis not present

## 2016-03-17 DIAGNOSIS — M531 Cervicobrachial syndrome: Secondary | ICD-10-CM | POA: Diagnosis not present

## 2016-03-27 DIAGNOSIS — R102 Pelvic and perineal pain: Secondary | ICD-10-CM | POA: Diagnosis not present

## 2016-04-21 DIAGNOSIS — N83202 Unspecified ovarian cyst, left side: Secondary | ICD-10-CM | POA: Diagnosis not present

## 2016-04-21 DIAGNOSIS — R102 Pelvic and perineal pain: Secondary | ICD-10-CM | POA: Diagnosis not present

## 2016-04-21 DIAGNOSIS — Z1273 Encounter for screening for malignant neoplasm of ovary: Secondary | ICD-10-CM | POA: Diagnosis not present

## 2016-05-21 DIAGNOSIS — M5416 Radiculopathy, lumbar region: Secondary | ICD-10-CM | POA: Diagnosis not present

## 2016-05-21 DIAGNOSIS — M9901 Segmental and somatic dysfunction of cervical region: Secondary | ICD-10-CM | POA: Diagnosis not present

## 2016-05-21 DIAGNOSIS — M9902 Segmental and somatic dysfunction of thoracic region: Secondary | ICD-10-CM | POA: Diagnosis not present

## 2016-05-21 DIAGNOSIS — M531 Cervicobrachial syndrome: Secondary | ICD-10-CM | POA: Diagnosis not present

## 2016-05-26 DIAGNOSIS — M05721 Rheumatoid arthritis with rheumatoid factor of right elbow without organ or systems involvement: Secondary | ICD-10-CM | POA: Diagnosis not present

## 2016-05-26 DIAGNOSIS — Z79899 Other long term (current) drug therapy: Secondary | ICD-10-CM | POA: Diagnosis not present

## 2016-05-26 DIAGNOSIS — M05722 Rheumatoid arthritis with rheumatoid factor of left elbow without organ or systems involvement: Secondary | ICD-10-CM | POA: Diagnosis not present

## 2016-05-26 DIAGNOSIS — M150 Primary generalized (osteo)arthritis: Secondary | ICD-10-CM | POA: Diagnosis not present

## 2016-05-26 DIAGNOSIS — M059 Rheumatoid arthritis with rheumatoid factor, unspecified: Secondary | ICD-10-CM | POA: Diagnosis not present

## 2016-06-05 DIAGNOSIS — I209 Angina pectoris, unspecified: Secondary | ICD-10-CM

## 2016-06-05 HISTORY — DX: Angina pectoris, unspecified: I20.9

## 2016-06-06 DIAGNOSIS — M059 Rheumatoid arthritis with rheumatoid factor, unspecified: Secondary | ICD-10-CM | POA: Diagnosis not present

## 2016-06-06 DIAGNOSIS — M05722 Rheumatoid arthritis with rheumatoid factor of left elbow without organ or systems involvement: Secondary | ICD-10-CM | POA: Diagnosis not present

## 2016-06-06 DIAGNOSIS — Z79899 Other long term (current) drug therapy: Secondary | ICD-10-CM | POA: Diagnosis not present

## 2016-06-06 DIAGNOSIS — Z9181 History of falling: Secondary | ICD-10-CM | POA: Diagnosis not present

## 2016-06-06 DIAGNOSIS — N390 Urinary tract infection, site not specified: Secondary | ICD-10-CM | POA: Diagnosis not present

## 2016-06-06 DIAGNOSIS — M05721 Rheumatoid arthritis with rheumatoid factor of right elbow without organ or systems involvement: Secondary | ICD-10-CM | POA: Diagnosis not present

## 2016-06-06 DIAGNOSIS — E7800 Pure hypercholesterolemia, unspecified: Secondary | ICD-10-CM | POA: Diagnosis not present

## 2016-06-06 DIAGNOSIS — E119 Type 2 diabetes mellitus without complications: Secondary | ICD-10-CM | POA: Diagnosis not present

## 2016-06-06 DIAGNOSIS — N343 Urethral syndrome, unspecified: Secondary | ICD-10-CM | POA: Diagnosis not present

## 2016-06-06 DIAGNOSIS — I10 Essential (primary) hypertension: Secondary | ICD-10-CM | POA: Diagnosis not present

## 2016-06-06 DIAGNOSIS — N8110 Cystocele, unspecified: Secondary | ICD-10-CM | POA: Diagnosis not present

## 2016-06-17 DIAGNOSIS — Z0000 Encounter for general adult medical examination without abnormal findings: Secondary | ICD-10-CM | POA: Diagnosis not present

## 2016-06-17 DIAGNOSIS — E119 Type 2 diabetes mellitus without complications: Secondary | ICD-10-CM | POA: Diagnosis not present

## 2016-06-17 DIAGNOSIS — M05721 Rheumatoid arthritis with rheumatoid factor of right elbow without organ or systems involvement: Secondary | ICD-10-CM | POA: Diagnosis not present

## 2016-06-17 DIAGNOSIS — I2510 Atherosclerotic heart disease of native coronary artery without angina pectoris: Secondary | ICD-10-CM | POA: Diagnosis not present

## 2016-06-17 DIAGNOSIS — I10 Essential (primary) hypertension: Secondary | ICD-10-CM | POA: Diagnosis not present

## 2016-06-17 DIAGNOSIS — M5441 Lumbago with sciatica, right side: Secondary | ICD-10-CM | POA: Diagnosis not present

## 2016-06-17 DIAGNOSIS — M05722 Rheumatoid arthritis with rheumatoid factor of left elbow without organ or systems involvement: Secondary | ICD-10-CM | POA: Diagnosis not present

## 2016-06-17 DIAGNOSIS — E7800 Pure hypercholesterolemia, unspecified: Secondary | ICD-10-CM | POA: Diagnosis not present

## 2016-06-23 DIAGNOSIS — M531 Cervicobrachial syndrome: Secondary | ICD-10-CM | POA: Diagnosis not present

## 2016-06-23 DIAGNOSIS — M5416 Radiculopathy, lumbar region: Secondary | ICD-10-CM | POA: Diagnosis not present

## 2016-06-23 DIAGNOSIS — M9901 Segmental and somatic dysfunction of cervical region: Secondary | ICD-10-CM | POA: Diagnosis not present

## 2016-06-23 DIAGNOSIS — M9902 Segmental and somatic dysfunction of thoracic region: Secondary | ICD-10-CM | POA: Diagnosis not present

## 2016-07-17 DIAGNOSIS — Z961 Presence of intraocular lens: Secondary | ICD-10-CM | POA: Diagnosis not present

## 2016-07-22 DIAGNOSIS — M9901 Segmental and somatic dysfunction of cervical region: Secondary | ICD-10-CM | POA: Diagnosis not present

## 2016-07-22 DIAGNOSIS — M9902 Segmental and somatic dysfunction of thoracic region: Secondary | ICD-10-CM | POA: Diagnosis not present

## 2016-07-22 DIAGNOSIS — M531 Cervicobrachial syndrome: Secondary | ICD-10-CM | POA: Diagnosis not present

## 2016-07-22 DIAGNOSIS — M5416 Radiculopathy, lumbar region: Secondary | ICD-10-CM | POA: Diagnosis not present

## 2016-08-09 DIAGNOSIS — M5416 Radiculopathy, lumbar region: Secondary | ICD-10-CM | POA: Diagnosis not present

## 2016-08-09 DIAGNOSIS — M531 Cervicobrachial syndrome: Secondary | ICD-10-CM | POA: Diagnosis not present

## 2016-08-09 DIAGNOSIS — M9902 Segmental and somatic dysfunction of thoracic region: Secondary | ICD-10-CM | POA: Diagnosis not present

## 2016-08-09 DIAGNOSIS — M9901 Segmental and somatic dysfunction of cervical region: Secondary | ICD-10-CM | POA: Diagnosis not present

## 2016-08-19 DIAGNOSIS — N83202 Unspecified ovarian cyst, left side: Secondary | ICD-10-CM | POA: Diagnosis not present

## 2016-08-19 DIAGNOSIS — Z853 Personal history of malignant neoplasm of breast: Secondary | ICD-10-CM | POA: Diagnosis not present

## 2016-08-19 DIAGNOSIS — R300 Dysuria: Secondary | ICD-10-CM | POA: Diagnosis not present

## 2016-08-27 DIAGNOSIS — Z01818 Encounter for other preprocedural examination: Secondary | ICD-10-CM | POA: Diagnosis not present

## 2016-08-27 DIAGNOSIS — I2510 Atherosclerotic heart disease of native coronary artery without angina pectoris: Secondary | ICD-10-CM | POA: Diagnosis not present

## 2016-08-27 DIAGNOSIS — I10 Essential (primary) hypertension: Secondary | ICD-10-CM | POA: Diagnosis not present

## 2016-08-27 DIAGNOSIS — F419 Anxiety disorder, unspecified: Secondary | ICD-10-CM | POA: Diagnosis not present

## 2016-08-27 DIAGNOSIS — M05721 Rheumatoid arthritis with rheumatoid factor of right elbow without organ or systems involvement: Secondary | ICD-10-CM | POA: Diagnosis not present

## 2016-08-27 DIAGNOSIS — N83202 Unspecified ovarian cyst, left side: Secondary | ICD-10-CM | POA: Diagnosis not present

## 2016-08-27 DIAGNOSIS — M05722 Rheumatoid arthritis with rheumatoid factor of left elbow without organ or systems involvement: Secondary | ICD-10-CM | POA: Diagnosis not present

## 2016-08-27 DIAGNOSIS — E119 Type 2 diabetes mellitus without complications: Secondary | ICD-10-CM | POA: Diagnosis not present

## 2016-08-28 ENCOUNTER — Inpatient Hospital Stay: Admission: RE | Admit: 2016-08-28 | Payer: Self-pay | Source: Ambulatory Visit

## 2016-09-02 ENCOUNTER — Encounter
Admission: RE | Admit: 2016-09-02 | Discharge: 2016-09-02 | Disposition: A | Discharge status: Home / Self Care | Payer: Medicare HMO | Source: Ambulatory Visit | Attending: Obstetrics & Gynecology | Admitting: Obstetrics & Gynecology

## 2016-09-02 DIAGNOSIS — N83202 Unspecified ovarian cyst, left side: Secondary | ICD-10-CM | POA: Diagnosis not present

## 2016-09-02 DIAGNOSIS — I10 Essential (primary) hypertension: Secondary | ICD-10-CM | POA: Insufficient documentation

## 2016-09-02 DIAGNOSIS — I2510 Atherosclerotic heart disease of native coronary artery without angina pectoris: Secondary | ICD-10-CM | POA: Insufficient documentation

## 2016-09-02 DIAGNOSIS — Z853 Personal history of malignant neoplasm of breast: Secondary | ICD-10-CM | POA: Insufficient documentation

## 2016-09-02 DIAGNOSIS — Z01818 Encounter for other preprocedural examination: Secondary | ICD-10-CM | POA: Diagnosis not present

## 2016-09-02 DIAGNOSIS — E119 Type 2 diabetes mellitus without complications: Secondary | ICD-10-CM | POA: Diagnosis not present

## 2016-09-02 DIAGNOSIS — N83292 Other ovarian cyst, left side: Secondary | ICD-10-CM | POA: Diagnosis not present

## 2016-09-02 HISTORY — DX: Low back pain: M54.5

## 2016-09-02 HISTORY — DX: Low back pain, unspecified: M54.50

## 2016-09-02 HISTORY — DX: Personal history of urinary calculi: Z87.442

## 2016-09-02 HISTORY — DX: Acute myocardial infarction, unspecified: I21.9

## 2016-09-02 HISTORY — DX: Cerebral infarction, unspecified: I63.9

## 2016-09-02 HISTORY — DX: Atherosclerotic heart disease of native coronary artery without angina pectoris: I25.10

## 2016-09-02 HISTORY — DX: Gastro-esophageal reflux disease without esophagitis: K21.9

## 2016-09-02 HISTORY — DX: Hyperlipidemia, unspecified: E78.5

## 2016-09-02 HISTORY — DX: Unspecified osteoarthritis, unspecified site: M19.90

## 2016-09-02 HISTORY — DX: Anxiety disorder, unspecified: F41.9

## 2016-09-02 LAB — BASIC METABOLIC PANEL
ANION GAP: 9 (ref 5–15)
BUN: 10 mg/dL (ref 6–20)
CHLORIDE: 92 mmol/L — AB (ref 101–111)
CO2: 31 mmol/L (ref 22–32)
CREATININE: 0.79 mg/dL (ref 0.44–1.00)
Calcium: 10 mg/dL (ref 8.9–10.3)
GFR calc non Af Amer: >60 mL/min (ref >60)
Glucose, Bld: 110 mg/dL — ABNORMAL HIGH (ref 65–99)
Potassium: 3.1 mmol/L — ABNORMAL LOW (ref 3.5–5.1)
SODIUM: 132 mmol/L — AB (ref 135–145)

## 2016-09-02 LAB — CBC
HEMATOCRIT: 38.9 % (ref 35.0–47.0)
HEMOGLOBIN: 13.9 g/dL (ref 12.0–16.0)
MCH: 29.7 pg (ref 26.0–34.0)
MCHC: 35.7 g/dL (ref 32.0–36.0)
MCV: 83.1 fL (ref 80.0–100.0)
Platelets: 318 10*3/uL (ref 150–440)
RBC: 4.68 MIL/uL (ref 3.80–5.20)
RDW: 15.9 % — ABNORMAL HIGH (ref 11.5–14.5)
WBC: 5.5 10*3/uL (ref 3.6–11.0)

## 2016-09-02 LAB — TYPE AND SCREEN
ABO/RH(D): B POS
ANTIBODY SCREEN: NEGATIVE

## 2016-09-02 NOTE — H&P (Addendum)
Patient ID: Elaine Cox is a 71 y.o. female presenting with Pre Op Consulting  on 09/02/2016  HPI: Miss Elaine Cox has been followed for pelvic pressure and persistent left ovarian cyst.  It was 2-3cm in January and grew to 5cm in May.  She requested removal.  In doing so, as also asked for removal of other tube and ovary.    She is s/p hysterectomy many years ago   TVUS: LO: 5x4x4cm with a 4.78x3.07x2.81cm cyst.   It was reported as simple, but there are two small poorly defined nodules within the cyst on review of images.    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  Past Surgical History:  has a past surgical history that includes Mastectomy Simple (Right); Cardiac catheterization (09-1441); Abdominal hysterectomy w/ partial vaginactomy; Right knee surgery; eyelid surgery; bladder tack; Colonoscopy (04/2012); and Hysterectomy. Family History: family history includes Colon polyps in her mother; Diabetes in her mother; Diabetes type II in her mother; High blood pressure (Hypertension) in her mother; No Known Problems in her father; Other in her mother. Social History:  reports that she has never smoked. She has never used smokeless tobacco. She reports that she does not drink alcohol or use drugs. OB/GYN History:  OB History    Gravida Para Term Preterm AB Living   5 4 3 1 1 3    SAB TAB Ectopic Molar Multiple Live Births             4      Allergies: is allergic to shellfish containing products; azithromycin; bactrim [sulfamethoxazole-trimethoprim]; beta-blockers (beta-adrenergic blocking agts); cardura  [doxazosin]; colchicine; dicloxacillin; glucosamine; iodine; leflunomide; lodine [etodolac]; methotrexate; mobic [meloxicam]; plavix [clopidogrel]; and sulfa (sulfonamide antibiotics). Medications:  Current Outpatient Prescriptions:  .  ACCU-CHEK AVIVA PLUS TEST STRP test strip, TEST BLOOD SUGAR EVERY DAY, Disp: 100 each, Rfl: 3 .  ACCU-CHEK SOFTCLIX LANCETS lancets, TEST ONE TIME DAILY, Disp: 100 each, Rfl: 3 .  ALPRAZolam (XANAX) 0.25 MG tablet, Take 1 tablet (0.25 mg total) by mouth once daily as needed for Sleep., Disp: 30 tablet, Rfl: 2 .  amLODIPine (NORVASC) 5 MG tablet, Take 1 tablet (5 mg total) by mouth once daily., Disp: 90 tablet, Rfl: 3 .  aspirin 81 MG EC tablet, Take 81 mg by mouth once daily. , Disp: , Rfl:  .  hydroCHLOROthiazide (HYDRODIURIL) 25 MG tablet, TAKE 1 TABLET ONE TIME DAILY, Disp: 90 tablet, Rfl: 3 .  hydrOXYzine HCl (ATARAX) 10 MG tablet, TAKE 1 TABLET THREE TIMES DAILY AS NEEDED  FOR  ITCHING ( SUBSTITUTED FOR ATARAX ) , Disp: 90 tablet, Rfl: 1 .  ketoconazole (NIZORAL) 2 % shampoo, Apply a small amount to skin once a day., Disp: 120 mL, Rfl: 1 .  metFORMIN (GLUCOPHAGE) 500 MG tablet, TAKE 1 TABLET TWICE DAILY, Disp: 180 tablet, Rfl: 3 .  metoprolol succinate (TOPROL-XL) 100 MG XL tablet, Take 1 tablet (100 mg total) by mouth once daily., Disp: 90 tablet, Rfl: 1 .  nitroGLYcerin (NITROSTAT) 0.4 MG SL tablet, Place 0.4 mg under the tongue every 5 (five) minutes as needed for Chest pain. May take up to 3 doses., Disp: , Rfl:  .  omeprazole (PRILOSEC) 20 MG DR capsule, TAKE 1 CAPSULE EVERY  DAY, Disp: 90 capsule, Rfl: 1 .  potassium chloride (KLOR-CON) 10 MEQ ER tablet, TAKE 1 TABLET TWICE DAILY, Disp: 180 tablet, Rfl: 1 .  tofacitinib (XELJANZ) 5 mg Tab, Take 1 tablet by mouth 2 (two) times daily., Disp: 180 tablet, Rfl: 2 .  sertraline (ZOLOFT) 50 MG tablet, Take 1 tablet (50 mg total) by mouth once daily. (Patient not taking: Reported on 09/02/2016 ), Disp: 90 tablet,  Rfl: 1   Review of Systems: No SOB, no palpitations or chest pain, no new lower extremity edema, no nausea or vomiting or bowel or bladder complaints. See HPI for gyn specific ROS.   Exam:     BP 116/66   Pulse 62   Ht 158.8 cm (5' 2.5")   Wt 85.9 kg (189 lb 6.4 oz)   BMI 34.09 kg/m   General: Patient is well-groomed, well-nourished, appears stated age in no acute distress  HEENT: head is atraumatic and normocephalic, trachea is midline, neck is supple with no palpable nodules  CV: Regular rhythm and normal heart rate, no murmur  Pulm: Clear to auscultation throughout lung fields with no wheezing, crackles, or rhonchi. No increased work of breathing  Abdomen: soft , no mass, non-tender, no rebound tenderness, no hepatomegaly  Pelvic: deferred   Impression:   The encounter diagnosis was Complex cyst of left ovary.    Plan:    The patient and I discussed the technical aspects of the procedure including the potential for risks and complications.These include but are not limited to the risk of infection requiring post-operative antibiotics or further procedures.We talked about the risk of injury to adjacent organs including bladder, bowel, ureter, blood vessels or nerves, the need to convert to an open incision, possibleneed for blood transfusion andpostop complications such asthromboembolic or cardiopulmonary complications.All of her questions were answered. Her preoperative exam was completed andthe appropriate consents were signed. She is scheduled to undergo this procedure in the near future.  Planned surgery:  Laparoscopic BSO  We watched a video of a BSO and defined the structures to be dissected, ureter, bowel, and went through possible complications with these organs.  Has pre-admit appointment today Has been cleared medically for surgery by Dr. Dessie Coma Lennox Pippins, MD

## 2016-09-02 NOTE — Patient Instructions (Signed)
  Your procedure is scheduled on: September 08, 2016 (Monday)Report to Same Day Surgery 2nd floor medical mall ([facility]-take elevator on left to 2nd floor.  Check in with surgery information desk.) To find out your arrival time please call [phone] between 1PM - 3PM on September 05, 2016 (Friday)  Remember: Instructions that are not followed completely may result in serious medical risk, up to and including death, or upon the discretion of your surgeon and anesthesiologist your surgery may need to be rescheduled.    _x___ 1. Do not eat food or drink liquids after midnight. No gum chewing or  hard candies                               __x__ 2. No Alcohol for 24 hours before or after surgery.   __x__3. No Smoking for 24 prior to surgery.   ____  4. Bring all medications with you on the day of surgery if instructed.    __x__ 5. Notify your doctor if there is any change in your medical condition     (cold, fever, infections).     Do not wear jewelry, make-up, hairpins, clips or nail polish.  Do not wear lotions, powders, or perfumes.  Do not shave 48 hours prior to surgery. Men may shave face and neck.  Do not bring valuables to the hospital.    [facility] is not responsible for any belongings or valuables.               Contacts, dentures or bridgework may not be worn into surgery.  Leave your suitcase in the car. After surgery it may be brought to your room.  For patients admitted to the hospital, discharge time is determined by your  treatment team                     Patients discharged the day of surgery will not be allowed to drive home.  You will need someone to drive you home and stay with you the night of your procedure.    Please read over the following fact sheets that you were given:   [facility] Preparing for Surgery and or MRSA Information   _x___ Take the following medications with a sip of water the morning of surgery:  1. AMLODIPINE  2. PRILOSEC (PRILOSEC AT  BEDTIME ON JUNE 3 )    ____Fleets enema or Magnesium Citrate as directed.   _x___ Use CHG Soap or sage wipes as directed on instruction sheet   ____ Use inhalers on the day of surgery and bring to hospital day of surgery  __X__ Stop Metformin and Janumet 2 days prior to surgery. (STOP METFORMIN ON JUNE 2 )  ____ Take 1/2 of usual insulin dose the night before surgery and none on the morning     surgery.   _x___ Follow recommendations from Cardiologist, Pulmonologist or PCP regarding          stopping Aspirin, Coumadin, Plavix ,Eliquis, Effient, or Pradaxa, and Pletal. (STOP ASPIRIN TODAY)  X____Stop Anti-inflammatories such as Advil, Aleve, Ibuprofen, Motrin, Naproxen, Naprosyn, Goodies powders or aspirin products. OK to take Tylenol     _x___ Stop supplements until after surgery.  But may continue Vitamin D, Vitamin B, and multivitamin (STOP BACK AND BODY SUPPLEMENT TODAY)        ____ Bring C-Pap to the hospital.

## 2016-09-03 LAB — CA 125: CA 125: 8.2 U/mL (ref 0.0–38.1)

## 2016-09-03 NOTE — Pre-Procedure Instructions (Signed)
Low potassium results called and faxed to Dr. Leonides Schanz office.

## 2016-09-08 ENCOUNTER — Ambulatory Visit
Admission: RE | Admit: 2016-09-08 | Discharge: 2016-09-08 | Disposition: A | Discharge status: Home / Self Care | Payer: Medicare HMO | Source: Ambulatory Visit | Attending: Obstetrics & Gynecology | Admitting: Obstetrics & Gynecology

## 2016-09-08 ENCOUNTER — Ambulatory Visit: Payer: Medicare HMO | Admitting: Anesthesiology

## 2016-09-08 ENCOUNTER — Encounter: Payer: Self-pay | Admitting: *Deleted

## 2016-09-08 ENCOUNTER — Encounter
Admission: RE | Disposition: A | Discharge status: Home / Self Care | Payer: Self-pay | Source: Ambulatory Visit | Attending: Obstetrics & Gynecology

## 2016-09-08 DIAGNOSIS — N83201 Unspecified ovarian cyst, right side: Secondary | ICD-10-CM | POA: Diagnosis not present

## 2016-09-08 DIAGNOSIS — Z79899 Other long term (current) drug therapy: Secondary | ICD-10-CM | POA: Diagnosis not present

## 2016-09-08 DIAGNOSIS — K219 Gastro-esophageal reflux disease without esophagitis: Secondary | ICD-10-CM | POA: Diagnosis not present

## 2016-09-08 DIAGNOSIS — I252 Old myocardial infarction: Secondary | ICD-10-CM | POA: Diagnosis not present

## 2016-09-08 DIAGNOSIS — N83209 Unspecified ovarian cyst, unspecified side: Secondary | ICD-10-CM | POA: Diagnosis not present

## 2016-09-08 DIAGNOSIS — M069 Rheumatoid arthritis, unspecified: Secondary | ICD-10-CM | POA: Insufficient documentation

## 2016-09-08 DIAGNOSIS — Z886 Allergy status to analgesic agent status: Secondary | ICD-10-CM | POA: Diagnosis not present

## 2016-09-08 DIAGNOSIS — Z90710 Acquired absence of both cervix and uterus: Secondary | ICD-10-CM | POA: Insufficient documentation

## 2016-09-08 DIAGNOSIS — Z881 Allergy status to other antibiotic agents status: Secondary | ICD-10-CM | POA: Diagnosis not present

## 2016-09-08 DIAGNOSIS — Z888 Allergy status to other drugs, medicaments and biological substances status: Secondary | ICD-10-CM | POA: Diagnosis not present

## 2016-09-08 DIAGNOSIS — Z853 Personal history of malignant neoplasm of breast: Secondary | ICD-10-CM | POA: Diagnosis not present

## 2016-09-08 DIAGNOSIS — Z8673 Personal history of transient ischemic attack (TIA), and cerebral infarction without residual deficits: Secondary | ICD-10-CM | POA: Insufficient documentation

## 2016-09-08 DIAGNOSIS — I10 Essential (primary) hypertension: Secondary | ICD-10-CM | POA: Diagnosis not present

## 2016-09-08 DIAGNOSIS — Z7982 Long term (current) use of aspirin: Secondary | ICD-10-CM | POA: Diagnosis not present

## 2016-09-08 DIAGNOSIS — N83292 Other ovarian cyst, left side: Secondary | ICD-10-CM | POA: Diagnosis not present

## 2016-09-08 DIAGNOSIS — Z7984 Long term (current) use of oral hypoglycemic drugs: Secondary | ICD-10-CM | POA: Insufficient documentation

## 2016-09-08 DIAGNOSIS — I2510 Atherosclerotic heart disease of native coronary artery without angina pectoris: Secondary | ICD-10-CM | POA: Diagnosis not present

## 2016-09-08 DIAGNOSIS — D271 Benign neoplasm of left ovary: Secondary | ICD-10-CM | POA: Insufficient documentation

## 2016-09-08 DIAGNOSIS — Z91013 Allergy to seafood: Secondary | ICD-10-CM | POA: Diagnosis not present

## 2016-09-08 DIAGNOSIS — E119 Type 2 diabetes mellitus without complications: Secondary | ICD-10-CM | POA: Insufficient documentation

## 2016-09-08 DIAGNOSIS — Z882 Allergy status to sulfonamides status: Secondary | ICD-10-CM | POA: Diagnosis not present

## 2016-09-08 DIAGNOSIS — E669 Obesity, unspecified: Secondary | ICD-10-CM | POA: Diagnosis not present

## 2016-09-08 DIAGNOSIS — D279 Benign neoplasm of unspecified ovary: Secondary | ICD-10-CM | POA: Diagnosis not present

## 2016-09-08 DIAGNOSIS — K660 Peritoneal adhesions (postprocedural) (postinfection): Secondary | ICD-10-CM | POA: Diagnosis not present

## 2016-09-08 DIAGNOSIS — N83291 Other ovarian cyst, right side: Secondary | ICD-10-CM | POA: Diagnosis not present

## 2016-09-08 DIAGNOSIS — N839 Noninflammatory disorder of ovary, fallopian tube and broad ligament, unspecified: Secondary | ICD-10-CM | POA: Diagnosis not present

## 2016-09-08 HISTORY — PX: CYSTOSCOPY: SHX5120

## 2016-09-08 HISTORY — PX: LAPAROSCOPIC BILATERAL SALPINGO OOPHERECTOMY: SHX5890

## 2016-09-08 LAB — POCT I-STAT 4, (NA,K, GLUC, HGB,HCT)
Glucose, Bld: 127 mg/dL — ABNORMAL HIGH (ref 65–99)
HCT: 36 % (ref 36.0–46.0)
HEMOGLOBIN: 12.2 g/dL (ref 12.0–15.0)
POTASSIUM: 3.3 mmol/L — AB (ref 3.5–5.1)
Sodium: 139 mmol/L (ref 135–145)

## 2016-09-08 LAB — GLUCOSE, CAPILLARY: GLUCOSE-CAPILLARY: 159 mg/dL — AB (ref 65–99)

## 2016-09-08 LAB — ABO/RH: ABO/RH(D): B POS

## 2016-09-08 SURGERY — SALPINGO-OOPHORECTOMY, BILATERAL, LAPAROSCOPIC
Anesthesia: General | Site: Bladder

## 2016-09-08 MED ORDER — KETOROLAC TROMETHAMINE 30 MG/ML IJ SOLN
INTRAMUSCULAR | Status: DC | PRN
  Administered 2016-09-08: 30 mg via INTRAVENOUS

## 2016-09-08 MED ORDER — GABAPENTIN 300 MG PO CAPS
ORAL_CAPSULE | ORAL | Status: AC
  Filled 2016-09-08: qty 2

## 2016-09-08 MED ORDER — FUROSEMIDE 10 MG/ML IJ SOLN
INTRAMUSCULAR | Status: DC | PRN
  Administered 2016-09-08: 10 mg via INTRAMUSCULAR
  Administered 2016-09-08: 10 mg via INTRAVENOUS

## 2016-09-08 MED ORDER — ACETAMINOPHEN 500 MG PO TABS
ORAL_TABLET | ORAL | Status: AC
  Filled 2016-09-08: qty 2

## 2016-09-08 MED ORDER — OXYCODONE HCL 5 MG PO TABS: 5.0000 mg | ORAL_TABLET | Freq: Once | ORAL | Status: DC | PRN

## 2016-09-08 MED ORDER — GABAPENTIN 300 MG PO CAPS
600.0000 mg | ORAL_CAPSULE | Freq: Once | ORAL | Status: AC
  Administered 2016-09-08: 600 mg via ORAL

## 2016-09-08 MED ORDER — KETOROLAC TROMETHAMINE 30 MG/ML IJ SOLN
INTRAMUSCULAR | Status: AC
  Filled 2016-09-08: qty 1

## 2016-09-08 MED ORDER — DEXAMETHASONE SODIUM PHOSPHATE 10 MG/ML IJ SOLN
INTRAMUSCULAR | Status: AC
  Filled 2016-09-08: qty 1

## 2016-09-08 MED ORDER — SODIUM CHLORIDE 0.9 % IV SOLN
INTRAVENOUS | Status: DC
  Administered 2016-09-08 (×2): via INTRAVENOUS

## 2016-09-08 MED ORDER — ROCURONIUM BROMIDE 100 MG/10ML IV SOLN
INTRAVENOUS | Status: DC | PRN
  Administered 2016-09-08: 50 mg via INTRAVENOUS
  Administered 2016-09-08 (×2): 10 mg via INTRAVENOUS

## 2016-09-08 MED ORDER — MEPERIDINE HCL 50 MG/ML IJ SOLN: 6.2500 mg | INTRAMUSCULAR | Status: DC | PRN

## 2016-09-08 MED ORDER — ROCURONIUM BROMIDE 50 MG/5ML IV SOLN
INTRAVENOUS | Status: AC
  Filled 2016-09-08: qty 1

## 2016-09-08 MED ORDER — OXYCODONE HCL 5 MG/5ML PO SOLN: 5.0000 mg | Freq: Once | ORAL | Status: DC | PRN

## 2016-09-08 MED ORDER — FENTANYL CITRATE (PF) 100 MCG/2ML IJ SOLN: 25.0000 ug | INTRAMUSCULAR | Status: DC | PRN

## 2016-09-08 MED ORDER — FENTANYL CITRATE (PF) 100 MCG/2ML IJ SOLN
INTRAMUSCULAR | Status: DC | PRN
  Administered 2016-09-08: 50 ug via INTRAVENOUS
  Administered 2016-09-08: 100 ug via INTRAVENOUS

## 2016-09-08 MED ORDER — SUGAMMADEX SODIUM 200 MG/2ML IV SOLN
INTRAVENOUS | Status: AC
  Filled 2016-09-08: qty 2

## 2016-09-08 MED ORDER — BUPIVACAINE HCL (PF) 0.5 % IJ SOLN
INTRAMUSCULAR | Status: AC
  Filled 2016-09-08: qty 30

## 2016-09-08 MED ORDER — ONDANSETRON HCL 4 MG/2ML IJ SOLN
INTRAMUSCULAR | Status: AC
  Filled 2016-09-08: qty 2

## 2016-09-08 MED ORDER — FLUORESCEIN SODIUM 10 % IV SOLN
INTRAVENOUS | Status: AC
  Filled 2016-09-08: qty 5

## 2016-09-08 MED ORDER — ONDANSETRON HCL 4 MG/2ML IJ SOLN
INTRAMUSCULAR | Status: DC | PRN
  Administered 2016-09-08: 4 mg via INTRAVENOUS

## 2016-09-08 MED ORDER — FENTANYL CITRATE (PF) 100 MCG/2ML IJ SOLN
INTRAMUSCULAR | Status: AC
  Filled 2016-09-08: qty 2

## 2016-09-08 MED ORDER — DEXAMETHASONE SODIUM PHOSPHATE 10 MG/ML IJ SOLN
INTRAMUSCULAR | Status: DC | PRN
  Administered 2016-09-08: 10 mg via INTRAVENOUS

## 2016-09-08 MED ORDER — ACETAMINOPHEN 500 MG PO TABS
1000.0000 mg | ORAL_TABLET | Freq: Once | ORAL | Status: AC
  Administered 2016-09-08: 1000 mg via ORAL

## 2016-09-08 MED ORDER — PROPOFOL 10 MG/ML IV BOLUS
INTRAVENOUS | Status: DC | PRN
  Administered 2016-09-08: 130 mg via INTRAVENOUS

## 2016-09-08 MED ORDER — PROPOFOL 10 MG/ML IV BOLUS
INTRAVENOUS | Status: AC
  Filled 2016-09-08: qty 20

## 2016-09-08 MED ORDER — IBUPROFEN 600 MG PO TABS: 600.0000 mg | ORAL_TABLET | Freq: Four times a day (QID) | ORAL | 1 refills | Status: DC

## 2016-09-08 MED ORDER — PROMETHAZINE HCL 25 MG/ML IJ SOLN: 6.2500 mg | INTRAMUSCULAR | Status: DC | PRN

## 2016-09-08 MED ORDER — SUGAMMADEX SODIUM 200 MG/2ML IV SOLN
INTRAVENOUS | Status: DC | PRN
  Administered 2016-09-08: 170 mg via INTRAVENOUS

## 2016-09-08 MED ORDER — OXYCODONE HCL 5 MG PO TABS
5.0000 mg | ORAL_TABLET | ORAL | 0 refills | Status: DC | PRN
Start: 2016-09-08 — End: 2017-03-26

## 2016-09-08 MED ORDER — FLUORESCEIN SODIUM 10 % IV SOLN
INTRAVENOUS | Status: DC | PRN
  Administered 2016-09-08: 50 mg via INTRAVENOUS

## 2016-09-08 SURGICAL SUPPLY — 56 items
ADH SKN CLS APL DERMABOND .7 (GAUZE/BANDAGES/DRESSINGS)
BAG URO DRAIN 2000ML W/SPOUT (MISCELLANEOUS) ×4 IMPLANT
BLADE SURG SZ11 CARB STEEL (BLADE) ×4 IMPLANT
CANISTER SUCT 1200ML W/VALVE (MISCELLANEOUS) ×4 IMPLANT
CATH FOLEY 2WAY  5CC 16FR (CATHETERS) ×2
CATH FOLEY 2WAY 5CC 16FR (CATHETERS) ×2
CATH ROBINSON RED A/P 16FR (CATHETERS) ×4 IMPLANT
CATH URTH 16FR FL 2W BLN LF (CATHETERS) ×2 IMPLANT
CHLORAPREP W/TINT 26ML (MISCELLANEOUS) ×4 IMPLANT
DERMABOND ADVANCED (GAUZE/BANDAGES/DRESSINGS)
DERMABOND ADVANCED .7 DNX12 (GAUZE/BANDAGES/DRESSINGS) IMPLANT
DRAPE LEGGINS SURG 28X43 STRL (DRAPES) ×4 IMPLANT
DRAPE UNDER BUTTOCK W/FLU (DRAPES) ×4 IMPLANT
DRSG TEGADERM 2-3/8X2-3/4 SM (GAUZE/BANDAGES/DRESSINGS) ×8 IMPLANT
DRSG TELFA 4X3 1S NADH ST (GAUZE/BANDAGES/DRESSINGS) ×12 IMPLANT
ENDOPOUCH RETRIEVER 10 (MISCELLANEOUS) ×2 IMPLANT
GAUZE SPONGE NON-WVN 2X2 STRL (MISCELLANEOUS) ×4 IMPLANT
GLOVE PI ORTHOPRO 6.5 (GLOVE) ×2
GLOVE PI ORTHOPRO STRL 6.5 (GLOVE) ×2 IMPLANT
GLOVE SURG SYN 6.5 ES PF (GLOVE) ×16 IMPLANT
GLOVE SURG SYN 6.5 PF PI (GLOVE) ×2 IMPLANT
GOWN STRL REUS W/ TWL LRG LVL3 (GOWN DISPOSABLE) ×4 IMPLANT
GOWN STRL REUS W/ TWL XL LVL3 (GOWN DISPOSABLE) ×2 IMPLANT
GOWN STRL REUS W/TWL LRG LVL3 (GOWN DISPOSABLE) ×8
GOWN STRL REUS W/TWL XL LVL3 (GOWN DISPOSABLE) ×4
GRASPER SUT TROCAR 14GX15 (MISCELLANEOUS) ×4 IMPLANT
IRRIGATION STRYKERFLOW (MISCELLANEOUS) IMPLANT
IRRIGATOR STRYKERFLOW (MISCELLANEOUS)
IV LACTATED RINGERS 1000ML (IV SOLUTION) ×4 IMPLANT
KIT PINK PAD W/HEAD ARE REST (MISCELLANEOUS) ×4
KIT PINK PAD W/HEAD ARM REST (MISCELLANEOUS) ×2 IMPLANT
KIT RM TURNOVER CYSTO AR (KITS) ×4 IMPLANT
L-HOOK LAP DISP 36CM (ELECTROSURGICAL) ×4
LABEL OR SOLS (LABEL) IMPLANT
LHOOK LAP DISP 36CM (ELECTROSURGICAL) IMPLANT
LIGASURE LAP MARYLAND 5MM 37CM (ELECTROSURGICAL) ×2 IMPLANT
LIGASURE VESSEL 5MM BLUNT TIP (ELECTROSURGICAL) ×2 IMPLANT
MANIPULATOR UTERINE 4.5 ZUMI (MISCELLANEOUS) ×2 IMPLANT
NEEDLE VERESS 14GA 120MM (NEEDLE) ×2 IMPLANT
NS IRRIG 500ML POUR BTL (IV SOLUTION) ×4 IMPLANT
PACK LAP CHOLECYSTECTOMY (MISCELLANEOUS) ×4 IMPLANT
PAD OB MATERNITY 4.3X12.25 (PERSONAL CARE ITEMS) ×4 IMPLANT
PAD PREP 24X41 OB/GYN DISP (PERSONAL CARE ITEMS) ×4 IMPLANT
PENCIL ELECTRO HAND CTR (MISCELLANEOUS) ×2 IMPLANT
SCISSORS METZENBAUM CVD 33 (INSTRUMENTS) ×2 IMPLANT
SET CYSTO W/LG BORE CLAMP LF (SET/KITS/TRAYS/PACK) ×2 IMPLANT
SHEARS HARMONIC ACE PLUS 36CM (ENDOMECHANICALS) ×2 IMPLANT
SLEEVE ENDOPATH XCEL 5M (ENDOMECHANICALS) ×8 IMPLANT
SPONGE VERSALON 2X2 STRL (MISCELLANEOUS) ×8
SUT MNCRL AB 4-0 PS2 18 (SUTURE) ×2 IMPLANT
SUT VIC AB 2-0 UR6 27 (SUTURE) ×2 IMPLANT
SUT VIC AB 4-0 PS2 18 (SUTURE) ×4 IMPLANT
SYRINGE 10CC LL (SYRINGE) ×4 IMPLANT
TROCAR ENDO BLADELESS 11MM (ENDOMECHANICALS) ×2 IMPLANT
TROCAR XCEL NON-BLD 5MMX100MML (ENDOMECHANICALS) ×4 IMPLANT
TUBING INSUFFLATOR HI FLOW (MISCELLANEOUS) ×4 IMPLANT

## 2016-09-08 NOTE — Discharge Instructions (Signed)
AMBULATORY SURGERY  DISCHARGE INSTRUCTIONS   1) The drugs that you were given will stay in your system until tomorrow so for the next 24 hours you should not:  A) Drive an automobile B) Make any legal decisions C) Drink any alcoholic beverage   2) You may resume regular meals tomorrow.  Today it is better to start with liquids and gradually work up to solid foods.  You may eat anything you prefer, but it is better to start with liquids, then soup and crackers, and gradually work up to solid foods.   3) Please notify your doctor immediately if you have any unusual bleeding, trouble breathing, redness and pain at the surgery site, drainage, fever, or pain not relieved by medication.    4) Additional Instructions:        Please contact your physician with any problems or Same Day Surgery at [phone], Monday through Friday 6 am to 4 pm, or [place] at [facility] number at [phone].Discharge instructions:  Call office if you have any of the following: fever >101 F, chills, excessive vaginal bleeding, incision drainage or problems, leg pain or redness, or any other concerns.   Activity: Do not lift > 15 lbs for 6 weeks.  Do not drive on narcotics or unless you are confident you can slam on the brakes in an emergency.   You may feel some pain in your upper right abdomen/rib and right shoulder.  This is from the gas in the abdomen for surgery. This will subside over time, please be patient!  Take 600mg  Ibuprofen and 1000mg  Tylenol around the clock, every 6 hours for at least the first 3-5 days.  After this you can take as needed.  This will help decrease inflammation and promote healing.  The narcotics you'll take just as needed, as they just trick your brain into thinking its not in pain.    Please don't limit yourself in terms of routine activity.  You will be able to do most things, although they may take longer to do or be a little painful.  You can do it!  Don't be  a hero, but don't be a wimp either!

## 2016-09-08 NOTE — Op Note (Addendum)
Total Laparoscopic Hysterectomy Operative Note Procedure Date: 09/08/2016  Patient:  Elaine Cox  71 y.o. female  PRE-OPERATIVE DIAGNOSIS:  Ovarian Cyst history of Breast Cancer  POST-OPERATIVE DIAGNOSIS:  Ovarian Cyst history of Breast Cancer  PROCEDURE:  Procedure(s): LAPAROSCOPIC BILATERAL SALPINGO OOPHORECTOMY (Bilateral) CYSTOSCOPY (N/A)  Lysis of adhesions >60 minutes Cystoscopy Bilateral urterolysis  SURGEON:  Surgeon(s) and Role:    * Khaleed Holan, Honor Loh, MD - Primary  ANESTHESIA:  General via ET  I/O  Total I/O In: 1400 [I.V.:1400] Out: 550 [Urine:500; Blood:50]  FINDINGS: adherent large and small bowel to the peritoneal surfaces of the pelvis diffusely.  Large cystic structure caudal to the l;eft external iliac artery, peritonealized.  Small ovary on the right side, peritonealized.  No evidence of tubes.  Bilateral ureters isolated and noted vermiculating throughout case.  Bilateral ureteral jets on cystoscopy.   SPECIMEN: left ovary, right ovary  COMPLICATIONS: none apparent  DISPOSITION: vital signs stable to PACU  Indication for Surgery: 71 y.o. G5P3 with history of breast cancer who complained of pelvic pressure, and was found to have an ovarian cyst.  CA 125 was normal, and size was ~2.5cm.  She was followed for 3 months and then re-imaged and the cyst increased in size to 5cm and there was a slight complexity to the cyst on imaging.  She requested definitive management with BSO.   Risks of surgery were discussed with the patient including but not limited to: bleeding which may require transfusion or reoperation; infection which may require antibiotics; injury to bowel, bladder, ureters or other surrounding organs; need for additional procedures including laparotomy, blood clot, incisional problems and other postoperative/anesthesia complications. Written informed consent was obtained.      PROCEDURE IN DETAIL:  The patient had 5000u Heparin Sub-q and sequential  compression devices applied to her lower extremities while in the preoperative area.  She was then taken to the operating room. IV antibiotics were given. General anesthesia was administered via endotracheal route.  She was laid supine,  A Foley catheter was inserted into her bladder and attached to constant drainage, and she was prepped and draped in a sterile manner. A surgical time-out was performed. An umbilical incision was made with the scalpel.  A 50mm trochar was inserted in the umbilical incision using a visiport method.Opening pressure was 83mmHg, and the abdomen was insufflated to 15mg Hg carbon dioxide gas and adequate pneumoperitoneum was obtained. A survey of the patient's pelvis and abdomen revealed the findings as mentioned above. Two 2mm ports were inserted in the lower left and right quadrants under visualization.   Pelvic washings were obtained. The Ligasure was used to cauterize and divide the omental adhesions to the anterior abdominal wall.  These sites were hemostatic.  The bowel was adherent to the pelvis in many areas, and the Ligasure was used without heat - the cutting function only - to divide the filmy adhesions.  Both of the ovaries had been peritonealized.  The sigmoid was draped over the left.  The ovary was displaced caudally, in line with the superior vesicle artery and caudal to the external iliac artery.  The cautery hook was used to create an opening in the peritoneum on the left, superior to the line of the cystic structure.  The peritoneum was pulled medially and a division of the underlying fat and tissues was performed.  The ureter was identified, isolated, skeletonized, and kept in view during the entire procedure on the left.  The IP ligament was identified, skeletonized,  and thrice cauterized then divided by the Ligasure.  The remaining peritoneal and fatty tissues were then sealed and divided circumfrentially around the ovary.  The bowel was out of the surgical field  throughout.   The attention was then turned to the right side, and the bowel was again with filmy adhesions to the surrounding tissues.  These were dissected with the cold blade of the Ligasure. The ovary had been peritonealized on this side as well, but was less visually obstructed.  The cautery hook was again used to divide the superior peritoneum and this was brought medially. The ureter was isolated and skeletonized, and kept away from the surgical line.  The ligasure was used to thrice seal then divide the IP ligament, and the surrounding peritoneum of the ovary.   The umbilical 80mm port was converted to a 25mm port, and an endocatch bag was inserted into the abdominal cavity, where the bilateral ovaries were placed in the bag.  The bag was brought to the surface and the bilateral ovaries were removed.  The trochar was reinserted and  Bilateral ureters were visualized vermiuclating. No intraoperative injury to surrounding organs was noted.  The inlet closure device was used to close the 84mm port site at the umbilicus in a figure-of-eight. The abdomen was desufflated and all instruments were then removed. All skin incisions were closed with 4-0 monocryl and covered with surgical glue.   The patient was then frog-legged in a sterile fashion, and the foley catheter removed.  The urethra was cleaned with hibiclens and a 70-degree cystoscope was inserted into the urethra.    The bladder was inflated with 300cc of normal saline, and inspection of the bladder wall was performed. 0.5cc Fluorescein was injected by anesthesia.  Bilateral ureteral jets were observed.  The cystoscope was removed. The patient tolerated the procedures well.  All instruments, needles, and sponge counts were correct x 2. The patient was taken to the recovery room in stable condition.   ---- Larey Days, MD Attending Obstetrician and [facility]

## 2016-09-08 NOTE — Anesthesia Preprocedure Evaluation (Signed)
Anesthesia Evaluation  Patient identified by MRN, date of birth, ID band Patient awake    Reviewed: Allergy & Precautions, NPO status , Patient's Chart, lab work & pertinent test results  History of Anesthesia Complications Negative for: history of anesthetic complications  Airway Mallampati: II  TM Distance: >3 FB Neck ROM: Full    Dental  (+) Edentulous Lower, Edentulous Upper   Pulmonary neg pulmonary ROS, neg sleep apnea, neg COPD,    breath sounds clear to auscultation- rhonchi (-) wheezing      Cardiovascular hypertension, Pt. on medications (-) angina+ CAD, + Past MI and + Cardiac Stents   Rhythm:Regular Rate:Normal - Systolic murmurs and - Diastolic murmurs    Neuro/Psych Anxiety CVA (L sided numbness), Residual Symptoms    GI/Hepatic Neg liver ROS, GERD  ,  Endo/Other  diabetes, Oral Hypoglycemic Agents  Renal/GU negative Renal ROS     Musculoskeletal  (+) Arthritis ,   Abdominal (+) + obese,   Peds  Hematology negative hematology ROS (+)   Anesthesia Other Findings Past Medical History: No date: Anxiety No date: Arthritis     Comment: Osteoarthritis No date: Arthritis     Comment: Rheumatoid 2000: Breast cancer ([place])     Comment: Right Breast - Chemotherapy No date: Collagen vascular disease ([place])     Comment: Rheumatoid Arthritis. No date: Coronary artery disease No date: Diabetes mellitus without complication (HCC)     Comment: Patient takes Metformin No date: GERD (gastroesophageal reflux disease) No date: History of kidney stones No date: Hyperlipidemia No date: Hypertension No date: Lumbago 07/2010: Myocardial infarction [facility]) 2006: Stroke [facility])   Reproductive/Obstetrics                             Anesthesia Physical Anesthesia Plan  ASA: III  Anesthesia Plan: General   Post-op Pain Management:    Induction: Intravenous  Airway Management Planned:  Oral ETT  Additional Equipment:   Intra-op Plan:   Post-operative Plan: Extubation in OR  Informed Consent: I have reviewed the patients History and Physical, chart, labs and discussed the procedure including the risks, benefits and alternatives for the proposed anesthesia with the patient or authorized representative who has indicated his/her understanding and acceptance.   Dental advisory given  Plan Discussed with: CRNA and Anesthesiologist  Anesthesia Plan Comments:         Anesthesia Quick Evaluation

## 2016-09-08 NOTE — Anesthesia Procedure Notes (Signed)
Procedure Name: Intubation Date/Time: 09/08/2016 1:48 PM Performed by: Jonna Clark Pre-anesthesia Checklist: Patient identified, Patient being monitored, Timeout performed, Emergency Drugs available and Suction available Patient Re-evaluated:Patient Re-evaluated prior to inductionOxygen Delivery Method: Circle system utilized Preoxygenation: Pre-oxygenation with 100% oxygen Intubation Type: IV induction Ventilation: Mask ventilation without difficulty Laryngoscope Size: Miller and 2 Grade View: Grade I Tube type: Oral Tube size: 7.0 mm Number of attempts: 1 Placement Confirmation: ETT inserted through vocal cords under direct vision,  positive ETCO2 and breath sounds checked- equal and bilateral Secured at: 21 cm Tube secured with: Tape Dental Injury: Teeth and Oropharynx as per pre-operative assessment

## 2016-09-08 NOTE — Interval H&P Note (Signed)
History and Physical Interval Note:  09/08/2016 12:56 PM  Elaine Cox  has presented today for surgery, with the diagnosis of Ovarian Cyst history of Breast Cancer  The various methods of treatment have been discussed with the patient and family. After consideration of risks, benefits and other options for treatment, the patient has consented to  Procedure(s): LAPAROSCOPIC BILATERAL SALPINGO OOPHORECTOMY (Bilateral) as a surgical intervention .  The patient's history has been reviewed, patient examined, no change in status, stable for surgery.  I have reviewed the patient's chart and labs.  Questions were answered to the patient's satisfaction.     [place]

## 2016-09-08 NOTE — Transfer of Care (Signed)
Immediate Anesthesia Transfer of Care Note  Patient: Elaine Cox  Procedure(s) Performed: Procedure(s): LAPAROSCOPIC BILATERAL SALPINGO OOPHORECTOMY (Bilateral) CYSTOSCOPY (N/A)  Patient Location: PACU  Anesthesia Type:General  Level of Consciousness: sedated and responds to stimulation  Airway & Oxygen Therapy: Patient Spontanous Breathing and Patient connected to face mask oxygen  Post-op Assessment: Report given to RN and Post -op Vital signs reviewed and stable  Post vital signs: Reviewed and stable  Last Vitals:  Vitals:   09/08/16 1128 09/08/16 1713  BP: 133/68 118/69  Pulse: (!) 58 62  Resp: 18 16  Temp: 36.7 C     Last Pain:  Vitals:   09/08/16 1128  TempSrc: Oral         Complications: No apparent anesthesia complications

## 2016-09-08 NOTE — Anesthesia Post-op Follow-up Note (Cosign Needed)
Anesthesia QCDR form completed.        

## 2016-09-09 ENCOUNTER — Encounter: Payer: Self-pay | Admitting: Obstetrics & Gynecology

## 2016-09-10 LAB — GLUCOSE, CAPILLARY: Glucose-Capillary: 130 mg/dL — ABNORMAL HIGH (ref 65–99)

## 2016-09-10 LAB — SURGICAL PATHOLOGY

## 2016-09-10 LAB — CYTOLOGY - NON PAP

## 2016-09-12 NOTE — Anesthesia Postprocedure Evaluation (Signed)
Anesthesia Post Note  Patient: Elaine Cox  Procedure(s) Performed: Procedure(s) (LRB): LAPAROSCOPIC BILATERAL SALPINGO OOPHORECTOMY (Bilateral) CYSTOSCOPY (N/A)  Patient location during evaluation: PACU Anesthesia Type: General Level of consciousness: awake and alert Pain management: pain level controlled Vital Signs Assessment: post-procedure vital signs reviewed and stable Respiratory status: spontaneous breathing, nonlabored ventilation, respiratory function stable and patient connected to nasal cannula oxygen Cardiovascular status: blood pressure returned to baseline and stable Postop Assessment: no signs of nausea or vomiting Anesthetic complications: no     Last Vitals:  Vitals:   09/08/16 1832 09/08/16 1901  BP: 122/61 (!) 115/56  Pulse: 69 72  Resp: 16 16  Temp: (!) 36.1 C     Last Pain:  Vitals:   09/09/16 0851  TempSrc:   PainSc: 1                  Molli Barrows

## 2016-09-17 ENCOUNTER — Other Ambulatory Visit: Payer: Self-pay | Admitting: Internal Medicine

## 2016-09-17 DIAGNOSIS — Z1231 Encounter for screening mammogram for malignant neoplasm of breast: Secondary | ICD-10-CM

## 2016-09-23 DIAGNOSIS — M05722 Rheumatoid arthritis with rheumatoid factor of left elbow without organ or systems involvement: Secondary | ICD-10-CM | POA: Diagnosis not present

## 2016-09-23 DIAGNOSIS — M05721 Rheumatoid arthritis with rheumatoid factor of right elbow without organ or systems involvement: Secondary | ICD-10-CM | POA: Diagnosis not present

## 2016-09-23 DIAGNOSIS — Z79899 Other long term (current) drug therapy: Secondary | ICD-10-CM | POA: Diagnosis not present

## 2016-09-23 DIAGNOSIS — N83202 Unspecified ovarian cyst, left side: Secondary | ICD-10-CM | POA: Diagnosis not present

## 2016-09-23 DIAGNOSIS — Z9889 Other specified postprocedural states: Secondary | ICD-10-CM | POA: Diagnosis not present

## 2016-09-23 DIAGNOSIS — M059 Rheumatoid arthritis with rheumatoid factor, unspecified: Secondary | ICD-10-CM | POA: Diagnosis not present

## 2016-09-23 DIAGNOSIS — M150 Primary generalized (osteo)arthritis: Secondary | ICD-10-CM | POA: Diagnosis not present

## 2016-09-29 DIAGNOSIS — M9901 Segmental and somatic dysfunction of cervical region: Secondary | ICD-10-CM | POA: Diagnosis not present

## 2016-09-29 DIAGNOSIS — M9902 Segmental and somatic dysfunction of thoracic region: Secondary | ICD-10-CM | POA: Diagnosis not present

## 2016-09-29 DIAGNOSIS — M5416 Radiculopathy, lumbar region: Secondary | ICD-10-CM | POA: Diagnosis not present

## 2016-09-29 DIAGNOSIS — M531 Cervicobrachial syndrome: Secondary | ICD-10-CM | POA: Diagnosis not present

## 2016-10-13 DIAGNOSIS — M9902 Segmental and somatic dysfunction of thoracic region: Secondary | ICD-10-CM | POA: Diagnosis not present

## 2016-10-13 DIAGNOSIS — M531 Cervicobrachial syndrome: Secondary | ICD-10-CM | POA: Diagnosis not present

## 2016-10-13 DIAGNOSIS — M5416 Radiculopathy, lumbar region: Secondary | ICD-10-CM | POA: Diagnosis not present

## 2016-10-13 DIAGNOSIS — M9901 Segmental and somatic dysfunction of cervical region: Secondary | ICD-10-CM | POA: Diagnosis not present

## 2016-10-15 DIAGNOSIS — M05721 Rheumatoid arthritis with rheumatoid factor of right elbow without organ or systems involvement: Secondary | ICD-10-CM | POA: Diagnosis not present

## 2016-10-15 DIAGNOSIS — E119 Type 2 diabetes mellitus without complications: Secondary | ICD-10-CM | POA: Diagnosis not present

## 2016-10-15 DIAGNOSIS — M5441 Lumbago with sciatica, right side: Secondary | ICD-10-CM | POA: Diagnosis not present

## 2016-10-15 DIAGNOSIS — I2510 Atherosclerotic heart disease of native coronary artery without angina pectoris: Secondary | ICD-10-CM | POA: Diagnosis not present

## 2016-10-15 DIAGNOSIS — I10 Essential (primary) hypertension: Secondary | ICD-10-CM | POA: Diagnosis not present

## 2016-10-15 DIAGNOSIS — M05722 Rheumatoid arthritis with rheumatoid factor of left elbow without organ or systems involvement: Secondary | ICD-10-CM | POA: Diagnosis not present

## 2016-10-15 DIAGNOSIS — E7800 Pure hypercholesterolemia, unspecified: Secondary | ICD-10-CM | POA: Diagnosis not present

## 2016-10-20 ENCOUNTER — Ambulatory Visit
Admission: RE | Admit: 2016-10-20 | Discharge: 2016-10-20 | Disposition: A | Discharge status: Home / Self Care | Payer: Medicare HMO | Source: Ambulatory Visit | Attending: Internal Medicine | Admitting: Internal Medicine

## 2016-10-20 DIAGNOSIS — Z1231 Encounter for screening mammogram for malignant neoplasm of breast: Secondary | ICD-10-CM | POA: Diagnosis not present

## 2016-10-20 HISTORY — DX: Personal history of antineoplastic chemotherapy: Z92.21

## 2016-10-22 DIAGNOSIS — Z6834 Body mass index (BMI) 34.0-34.9, adult: Secondary | ICD-10-CM | POA: Diagnosis not present

## 2016-10-22 DIAGNOSIS — E7800 Pure hypercholesterolemia, unspecified: Secondary | ICD-10-CM | POA: Diagnosis not present

## 2016-10-22 DIAGNOSIS — M05722 Rheumatoid arthritis with rheumatoid factor of left elbow without organ or systems involvement: Secondary | ICD-10-CM | POA: Diagnosis not present

## 2016-10-22 DIAGNOSIS — I2510 Atherosclerotic heart disease of native coronary artery without angina pectoris: Secondary | ICD-10-CM | POA: Diagnosis not present

## 2016-10-22 DIAGNOSIS — F411 Generalized anxiety disorder: Secondary | ICD-10-CM | POA: Diagnosis not present

## 2016-10-22 DIAGNOSIS — I10 Essential (primary) hypertension: Secondary | ICD-10-CM | POA: Diagnosis not present

## 2016-10-22 DIAGNOSIS — E119 Type 2 diabetes mellitus without complications: Secondary | ICD-10-CM | POA: Diagnosis not present

## 2016-10-22 DIAGNOSIS — M05721 Rheumatoid arthritis with rheumatoid factor of right elbow without organ or systems involvement: Secondary | ICD-10-CM | POA: Diagnosis not present

## 2016-10-22 DIAGNOSIS — Z0000 Encounter for general adult medical examination without abnormal findings: Secondary | ICD-10-CM | POA: Diagnosis not present

## 2016-12-02 DIAGNOSIS — I10 Essential (primary) hypertension: Secondary | ICD-10-CM | POA: Diagnosis not present

## 2016-12-02 DIAGNOSIS — J069 Acute upper respiratory infection, unspecified: Secondary | ICD-10-CM | POA: Diagnosis not present

## 2016-12-02 DIAGNOSIS — M150 Primary generalized (osteo)arthritis: Secondary | ICD-10-CM | POA: Diagnosis not present

## 2016-12-02 DIAGNOSIS — M05722 Rheumatoid arthritis with rheumatoid factor of left elbow without organ or systems involvement: Secondary | ICD-10-CM | POA: Diagnosis not present

## 2016-12-02 DIAGNOSIS — M05721 Rheumatoid arthritis with rheumatoid factor of right elbow without organ or systems involvement: Secondary | ICD-10-CM | POA: Diagnosis not present

## 2016-12-02 DIAGNOSIS — E119 Type 2 diabetes mellitus without complications: Secondary | ICD-10-CM | POA: Diagnosis not present

## 2016-12-15 DIAGNOSIS — M9905 Segmental and somatic dysfunction of pelvic region: Secondary | ICD-10-CM | POA: Diagnosis not present

## 2016-12-15 DIAGNOSIS — M9903 Segmental and somatic dysfunction of lumbar region: Secondary | ICD-10-CM | POA: Diagnosis not present

## 2016-12-15 DIAGNOSIS — M955 Acquired deformity of pelvis: Secondary | ICD-10-CM | POA: Diagnosis not present

## 2016-12-15 DIAGNOSIS — M5416 Radiculopathy, lumbar region: Secondary | ICD-10-CM | POA: Diagnosis not present

## 2016-12-29 DIAGNOSIS — M5416 Radiculopathy, lumbar region: Secondary | ICD-10-CM | POA: Diagnosis not present

## 2016-12-29 DIAGNOSIS — M9905 Segmental and somatic dysfunction of pelvic region: Secondary | ICD-10-CM | POA: Diagnosis not present

## 2016-12-29 DIAGNOSIS — M9903 Segmental and somatic dysfunction of lumbar region: Secondary | ICD-10-CM | POA: Diagnosis not present

## 2016-12-29 DIAGNOSIS — M955 Acquired deformity of pelvis: Secondary | ICD-10-CM | POA: Diagnosis not present

## 2017-01-12 DIAGNOSIS — M9905 Segmental and somatic dysfunction of pelvic region: Secondary | ICD-10-CM | POA: Diagnosis not present

## 2017-01-12 DIAGNOSIS — M5416 Radiculopathy, lumbar region: Secondary | ICD-10-CM | POA: Diagnosis not present

## 2017-01-12 DIAGNOSIS — M955 Acquired deformity of pelvis: Secondary | ICD-10-CM | POA: Diagnosis not present

## 2017-01-12 DIAGNOSIS — M9903 Segmental and somatic dysfunction of lumbar region: Secondary | ICD-10-CM | POA: Diagnosis not present

## 2017-01-21 DIAGNOSIS — I10 Essential (primary) hypertension: Secondary | ICD-10-CM | POA: Diagnosis not present

## 2017-01-21 DIAGNOSIS — H25811 Combined forms of age-related cataract, right eye: Secondary | ICD-10-CM | POA: Diagnosis not present

## 2017-01-21 DIAGNOSIS — E119 Type 2 diabetes mellitus without complications: Secondary | ICD-10-CM | POA: Diagnosis not present

## 2017-01-21 DIAGNOSIS — H35033 Hypertensive retinopathy, bilateral: Secondary | ICD-10-CM | POA: Diagnosis not present

## 2017-01-22 DIAGNOSIS — M059 Rheumatoid arthritis with rheumatoid factor, unspecified: Secondary | ICD-10-CM | POA: Diagnosis not present

## 2017-01-22 DIAGNOSIS — M150 Primary generalized (osteo)arthritis: Secondary | ICD-10-CM | POA: Diagnosis not present

## 2017-01-22 DIAGNOSIS — Z79899 Other long term (current) drug therapy: Secondary | ICD-10-CM | POA: Diagnosis not present

## 2017-01-22 DIAGNOSIS — R7611 Nonspecific reaction to tuberculin skin test without active tuberculosis: Secondary | ICD-10-CM | POA: Diagnosis not present

## 2017-01-26 DIAGNOSIS — M955 Acquired deformity of pelvis: Secondary | ICD-10-CM | POA: Diagnosis not present

## 2017-01-26 DIAGNOSIS — M5416 Radiculopathy, lumbar region: Secondary | ICD-10-CM | POA: Diagnosis not present

## 2017-01-26 DIAGNOSIS — M9905 Segmental and somatic dysfunction of pelvic region: Secondary | ICD-10-CM | POA: Diagnosis not present

## 2017-01-26 DIAGNOSIS — M9903 Segmental and somatic dysfunction of lumbar region: Secondary | ICD-10-CM | POA: Diagnosis not present

## 2017-01-31 IMAGING — MG MM DIGITAL SCREENING UNILAT*L* W/ TOMO W/ CAD
6 series · 6 of 14 positions shown · non-contrast
Comparison: Previous exam(s).

CLINICAL DATA: Screening.

EXAM:
2D DIGITAL SCREENING UNILATERAL LEFT MAMMOGRAM WITH CAD AND ADJUNCT
TOMO

[L CC synth-2D]
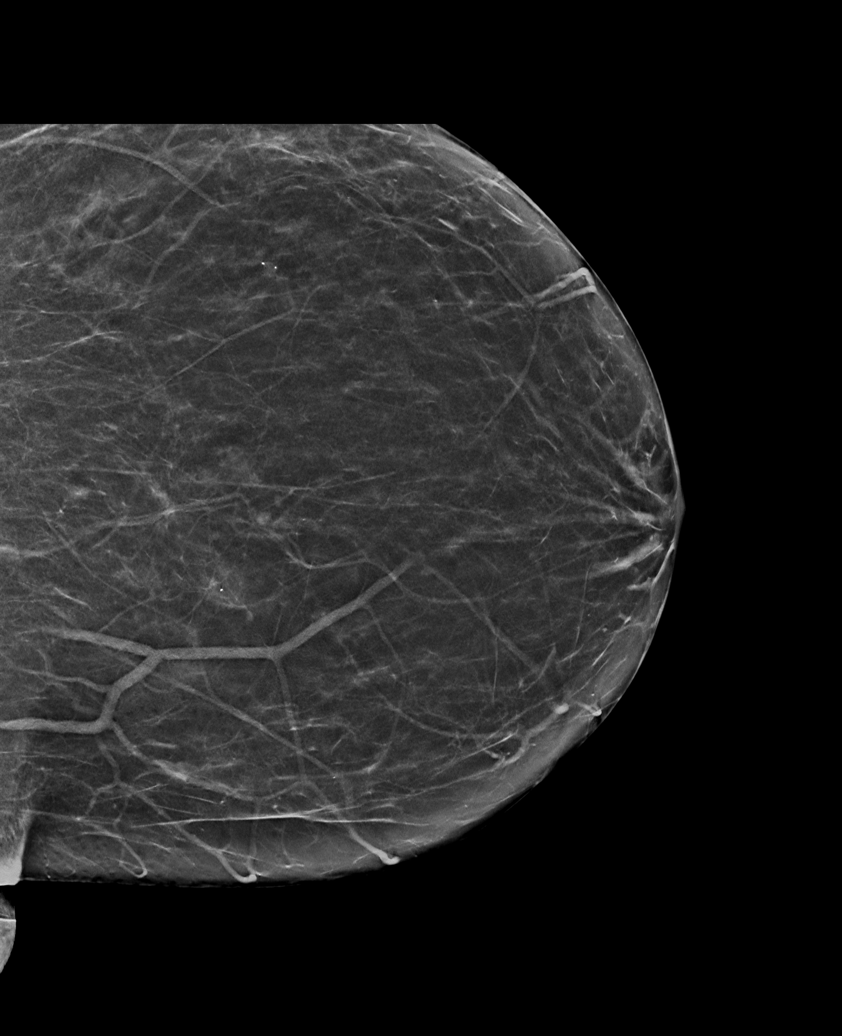

[L MLO synth-2D]
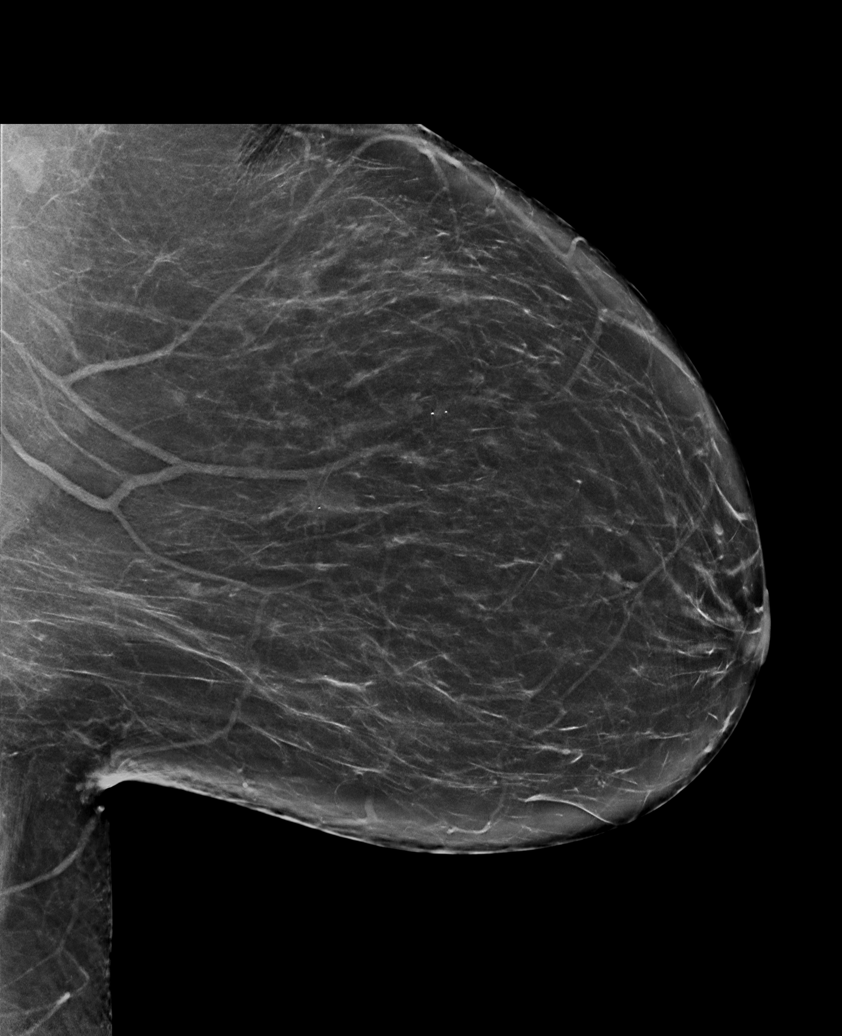

[L MLO]
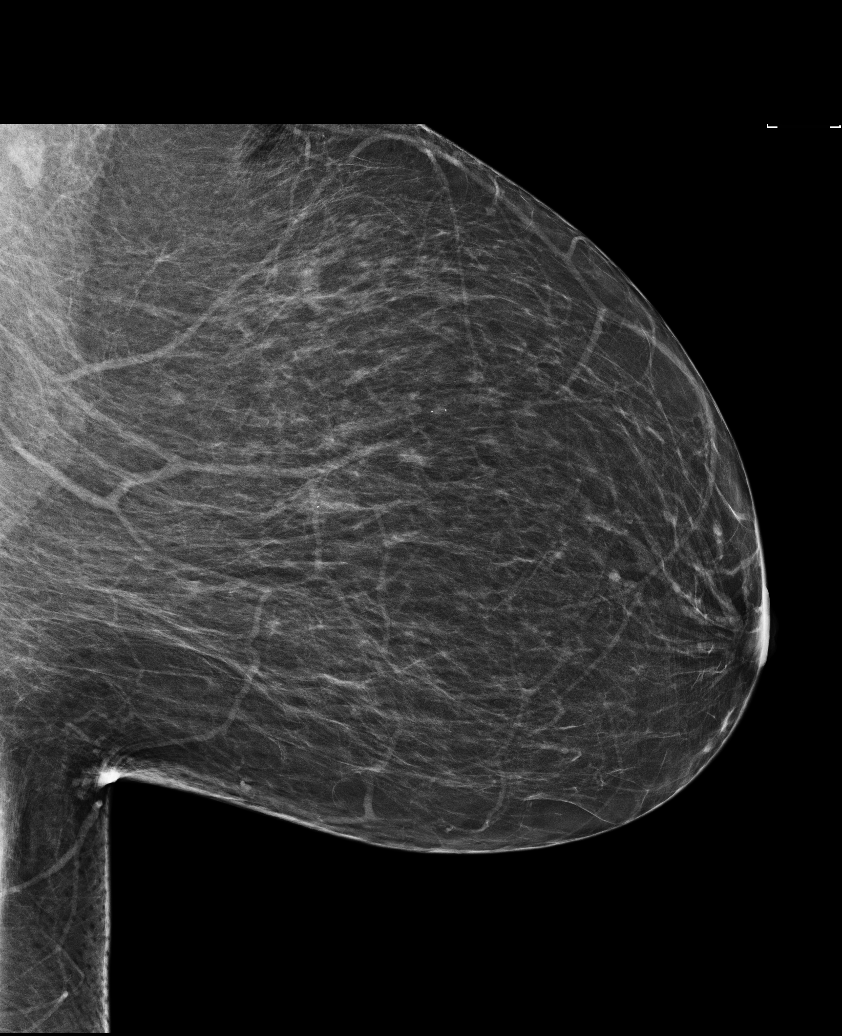

[L CC]
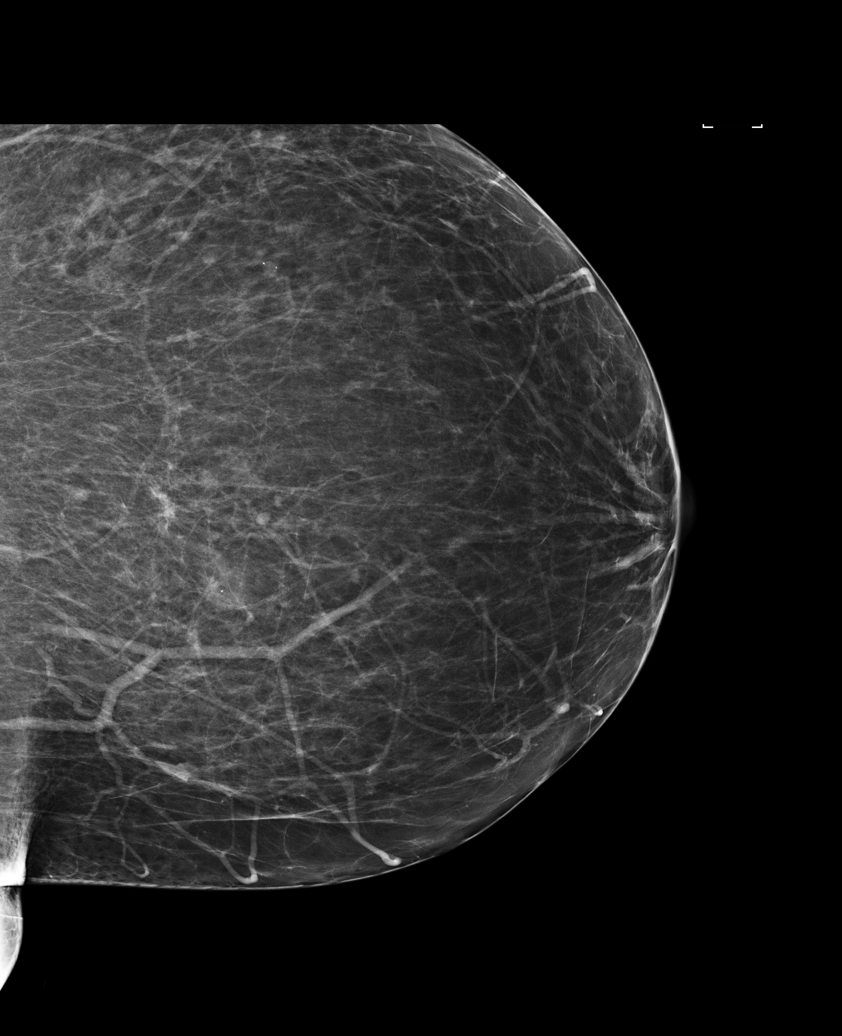

[L CC tomo · tomo slice 31/62.0]
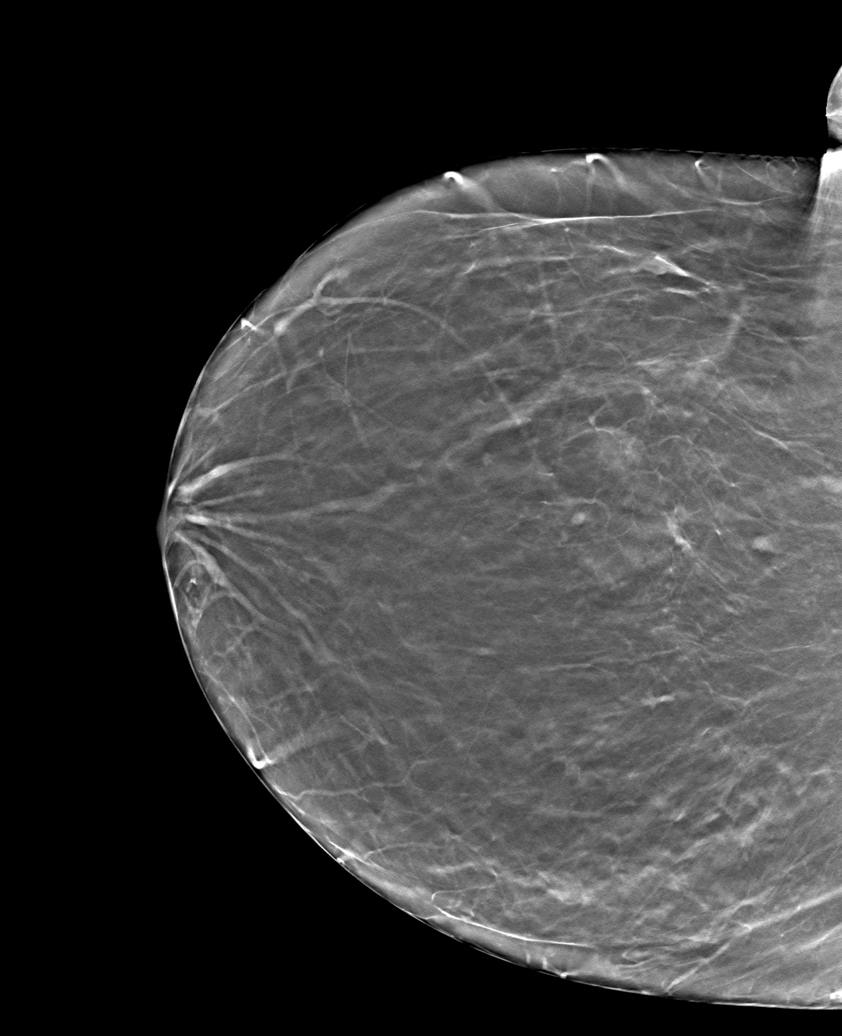

[L MLO tomo · tomo slice 41/80.0]
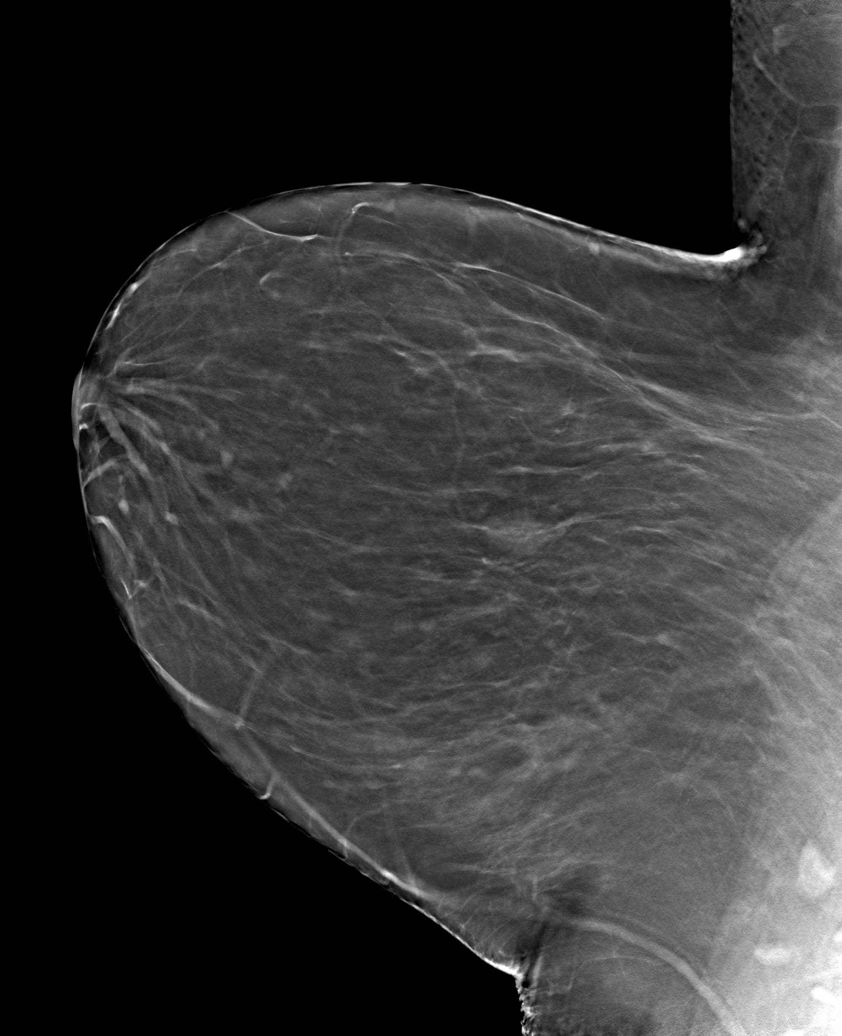

[6 of 14 positions shown; findings below may reference images not displayed]

ACR Breast Density Category b: There are scattered areas of
fibroglandular density.
FINDINGS: The patient has had a right mastectomy. There are no findings
suspicious for malignancy.

Images were processed with CAD.
IMPRESSION: No mammographic evidence of malignancy. A result letter of this
screening mammogram will be mailed directly to the patient.

RECOMMENDATION:
Screening mammogram in one year.  (Code:Y6-S-3T6)

BI-RADS CATEGORY  1: Negative.

## 2017-02-05 DIAGNOSIS — H2511 Age-related nuclear cataract, right eye: Secondary | ICD-10-CM | POA: Diagnosis not present

## 2017-02-07 DIAGNOSIS — M9903 Segmental and somatic dysfunction of lumbar region: Secondary | ICD-10-CM | POA: Diagnosis not present

## 2017-02-07 DIAGNOSIS — M955 Acquired deformity of pelvis: Secondary | ICD-10-CM | POA: Diagnosis not present

## 2017-02-07 DIAGNOSIS — M5416 Radiculopathy, lumbar region: Secondary | ICD-10-CM | POA: Diagnosis not present

## 2017-02-07 DIAGNOSIS — M9905 Segmental and somatic dysfunction of pelvic region: Secondary | ICD-10-CM | POA: Diagnosis not present

## 2017-02-17 DIAGNOSIS — E7800 Pure hypercholesterolemia, unspecified: Secondary | ICD-10-CM | POA: Diagnosis not present

## 2017-02-17 DIAGNOSIS — F411 Generalized anxiety disorder: Secondary | ICD-10-CM | POA: Diagnosis not present

## 2017-02-17 DIAGNOSIS — M05721 Rheumatoid arthritis with rheumatoid factor of right elbow without organ or systems involvement: Secondary | ICD-10-CM | POA: Diagnosis not present

## 2017-02-17 DIAGNOSIS — Z6834 Body mass index (BMI) 34.0-34.9, adult: Secondary | ICD-10-CM | POA: Diagnosis not present

## 2017-02-17 DIAGNOSIS — M05722 Rheumatoid arthritis with rheumatoid factor of left elbow without organ or systems involvement: Secondary | ICD-10-CM | POA: Diagnosis not present

## 2017-02-17 DIAGNOSIS — I2510 Atherosclerotic heart disease of native coronary artery without angina pectoris: Secondary | ICD-10-CM | POA: Diagnosis not present

## 2017-02-17 DIAGNOSIS — Z79899 Other long term (current) drug therapy: Secondary | ICD-10-CM | POA: Diagnosis not present

## 2017-02-17 DIAGNOSIS — I10 Essential (primary) hypertension: Secondary | ICD-10-CM | POA: Diagnosis not present

## 2017-02-17 DIAGNOSIS — Z0000 Encounter for general adult medical examination without abnormal findings: Secondary | ICD-10-CM | POA: Diagnosis not present

## 2017-02-17 DIAGNOSIS — M059 Rheumatoid arthritis with rheumatoid factor, unspecified: Secondary | ICD-10-CM | POA: Diagnosis not present

## 2017-02-23 DIAGNOSIS — E119 Type 2 diabetes mellitus without complications: Secondary | ICD-10-CM | POA: Diagnosis not present

## 2017-02-23 DIAGNOSIS — I10 Essential (primary) hypertension: Secondary | ICD-10-CM | POA: Diagnosis not present

## 2017-02-23 DIAGNOSIS — G8929 Other chronic pain: Secondary | ICD-10-CM | POA: Diagnosis not present

## 2017-02-23 DIAGNOSIS — M545 Low back pain: Secondary | ICD-10-CM | POA: Diagnosis not present

## 2017-02-23 DIAGNOSIS — Z23 Encounter for immunization: Secondary | ICD-10-CM | POA: Diagnosis not present

## 2017-02-23 DIAGNOSIS — R300 Dysuria: Secondary | ICD-10-CM | POA: Diagnosis not present

## 2017-02-23 DIAGNOSIS — R0602 Shortness of breath: Secondary | ICD-10-CM | POA: Diagnosis not present

## 2017-02-23 DIAGNOSIS — F419 Anxiety disorder, unspecified: Secondary | ICD-10-CM | POA: Diagnosis not present

## 2017-03-07 DIAGNOSIS — R0601 Orthopnea: Secondary | ICD-10-CM

## 2017-03-07 DIAGNOSIS — R0600 Dyspnea, unspecified: Secondary | ICD-10-CM

## 2017-03-07 HISTORY — DX: Dyspnea, unspecified: R06.00

## 2017-03-07 HISTORY — DX: Orthopnea: R06.01

## 2017-03-12 DIAGNOSIS — I10 Essential (primary) hypertension: Secondary | ICD-10-CM | POA: Diagnosis not present

## 2017-03-12 DIAGNOSIS — I6310 Cerebral infarction due to embolism of unspecified precerebral artery: Secondary | ICD-10-CM | POA: Diagnosis not present

## 2017-03-12 DIAGNOSIS — E7849 Other hyperlipidemia: Secondary | ICD-10-CM | POA: Diagnosis not present

## 2017-03-12 DIAGNOSIS — R079 Chest pain, unspecified: Secondary | ICD-10-CM | POA: Diagnosis not present

## 2017-03-12 DIAGNOSIS — I2510 Atherosclerotic heart disease of native coronary artery without angina pectoris: Secondary | ICD-10-CM | POA: Diagnosis not present

## 2017-03-12 DIAGNOSIS — R0602 Shortness of breath: Secondary | ICD-10-CM | POA: Diagnosis not present

## 2017-03-13 DIAGNOSIS — R8290 Unspecified abnormal findings in urine: Secondary | ICD-10-CM | POA: Diagnosis not present

## 2017-03-13 DIAGNOSIS — R399 Unspecified symptoms and signs involving the genitourinary system: Secondary | ICD-10-CM | POA: Diagnosis not present

## 2017-03-23 DIAGNOSIS — H2511 Age-related nuclear cataract, right eye: Secondary | ICD-10-CM | POA: Diagnosis not present

## 2017-03-27 ENCOUNTER — Encounter: Payer: Self-pay | Admitting: *Deleted

## 2017-03-27 NOTE — OR Nursing (Signed)
Component Name Value Ref Range  Vent Rate (bpm) 57   PR Interval (msec) 174   QRS Interval (msec) 76   QT Interval (msec) 430   QTc (msec) 418   Other Result Information  This result has an attachment that is not available.  Result Narrative  Sinus bradycardia Low voltage QRS Nonspecific T wave abnormalities Abnormal ECG When compared with ECG of 27-Aug-2016 09:38, No significant change was found I reviewed and concur with this report. Electronically signed LZ:JQBH MD, KEN [phone]) on 03/15/2017 12:30:27 PM   EKG results from 03/12/2017

## 2017-04-01 DIAGNOSIS — R0602 Shortness of breath: Secondary | ICD-10-CM | POA: Diagnosis not present

## 2017-04-02 ENCOUNTER — Ambulatory Visit: Admission: RE | Admit: 2017-04-02 | Payer: Medicare HMO | Source: Ambulatory Visit | Admitting: Ophthalmology

## 2017-04-02 ENCOUNTER — Encounter: Admission: RE | Payer: Self-pay | Source: Ambulatory Visit

## 2017-04-02 HISTORY — DX: Dyspnea, unspecified: R06.00

## 2017-04-02 HISTORY — DX: Angina pectoris, unspecified: I20.9

## 2017-04-02 HISTORY — DX: Unspecified disorder of nose and nasal sinuses: J34.9

## 2017-04-02 HISTORY — DX: Orthopnea: R06.01

## 2017-04-02 SURGERY — PHACOEMULSIFICATION, CATARACT, WITH IOL INSERTION
Anesthesia: Choice | Laterality: Right

## 2017-04-03 ENCOUNTER — Other Ambulatory Visit: Payer: Self-pay | Admitting: Cardiology

## 2017-04-03 DIAGNOSIS — B029 Zoster without complications: Secondary | ICD-10-CM | POA: Diagnosis not present

## 2017-04-03 DIAGNOSIS — R079 Chest pain, unspecified: Secondary | ICD-10-CM

## 2017-04-13 DIAGNOSIS — E7849 Other hyperlipidemia: Secondary | ICD-10-CM | POA: Diagnosis not present

## 2017-04-13 DIAGNOSIS — I10 Essential (primary) hypertension: Secondary | ICD-10-CM | POA: Diagnosis not present

## 2017-04-13 DIAGNOSIS — I6310 Cerebral infarction due to embolism of unspecified precerebral artery: Secondary | ICD-10-CM | POA: Diagnosis not present

## 2017-04-13 DIAGNOSIS — I2510 Atherosclerotic heart disease of native coronary artery without angina pectoris: Secondary | ICD-10-CM | POA: Diagnosis not present

## 2017-05-25 DIAGNOSIS — Z79899 Other long term (current) drug therapy: Secondary | ICD-10-CM | POA: Diagnosis not present

## 2017-05-25 DIAGNOSIS — M7551 Bursitis of right shoulder: Secondary | ICD-10-CM | POA: Diagnosis not present

## 2017-05-25 DIAGNOSIS — M150 Primary generalized (osteo)arthritis: Secondary | ICD-10-CM | POA: Diagnosis not present

## 2017-05-25 DIAGNOSIS — M059 Rheumatoid arthritis with rheumatoid factor, unspecified: Secondary | ICD-10-CM | POA: Diagnosis not present

## 2017-06-08 DIAGNOSIS — J0140 Acute pansinusitis, unspecified: Secondary | ICD-10-CM | POA: Diagnosis not present

## 2017-06-15 DIAGNOSIS — R300 Dysuria: Secondary | ICD-10-CM | POA: Diagnosis not present

## 2017-06-15 DIAGNOSIS — M545 Low back pain: Secondary | ICD-10-CM | POA: Diagnosis not present

## 2017-06-15 DIAGNOSIS — F419 Anxiety disorder, unspecified: Secondary | ICD-10-CM | POA: Diagnosis not present

## 2017-06-15 DIAGNOSIS — R0602 Shortness of breath: Secondary | ICD-10-CM | POA: Diagnosis not present

## 2017-06-15 DIAGNOSIS — M059 Rheumatoid arthritis with rheumatoid factor, unspecified: Secondary | ICD-10-CM | POA: Diagnosis not present

## 2017-06-15 DIAGNOSIS — G8929 Other chronic pain: Secondary | ICD-10-CM | POA: Diagnosis not present

## 2017-06-15 DIAGNOSIS — I10 Essential (primary) hypertension: Secondary | ICD-10-CM | POA: Diagnosis not present

## 2017-06-15 DIAGNOSIS — E119 Type 2 diabetes mellitus without complications: Secondary | ICD-10-CM | POA: Diagnosis not present

## 2017-06-15 DIAGNOSIS — Z79899 Other long term (current) drug therapy: Secondary | ICD-10-CM | POA: Diagnosis not present

## 2017-06-17 ENCOUNTER — Encounter: Payer: Self-pay | Admitting: *Deleted

## 2017-06-17 ENCOUNTER — Other Ambulatory Visit: Payer: Self-pay

## 2017-06-22 DIAGNOSIS — M05722 Rheumatoid arthritis with rheumatoid factor of left elbow without organ or systems involvement: Secondary | ICD-10-CM | POA: Diagnosis not present

## 2017-06-22 DIAGNOSIS — I10 Essential (primary) hypertension: Secondary | ICD-10-CM | POA: Diagnosis not present

## 2017-06-22 DIAGNOSIS — M05721 Rheumatoid arthritis with rheumatoid factor of right elbow without organ or systems involvement: Secondary | ICD-10-CM | POA: Diagnosis not present

## 2017-06-22 DIAGNOSIS — E119 Type 2 diabetes mellitus without complications: Secondary | ICD-10-CM | POA: Diagnosis not present

## 2017-06-22 DIAGNOSIS — I2510 Atherosclerotic heart disease of native coronary artery without angina pectoris: Secondary | ICD-10-CM | POA: Diagnosis not present

## 2017-06-22 DIAGNOSIS — F419 Anxiety disorder, unspecified: Secondary | ICD-10-CM | POA: Diagnosis not present

## 2017-06-22 DIAGNOSIS — Z0000 Encounter for general adult medical examination without abnormal findings: Secondary | ICD-10-CM | POA: Diagnosis not present

## 2017-06-22 NOTE — Discharge Instructions (Signed)

## 2017-06-23 MED ORDER — SODIUM CHLORIDE 0.9 % IV SOLN: INTRAVENOUS | Status: DC

## 2017-06-24 ENCOUNTER — Ambulatory Visit: Payer: Medicare HMO | Admitting: Anesthesiology

## 2017-06-24 ENCOUNTER — Ambulatory Visit
Admission: RE | Admit: 2017-06-24 | Discharge: 2017-06-24 | Disposition: A | Discharge status: Home / Self Care | Payer: Medicare HMO | Source: Ambulatory Visit | Attending: Ophthalmology | Admitting: Ophthalmology

## 2017-06-24 ENCOUNTER — Encounter
Admission: RE | Disposition: A | Discharge status: Home / Self Care | Payer: Self-pay | Source: Ambulatory Visit | Attending: Ophthalmology

## 2017-06-24 DIAGNOSIS — Z853 Personal history of malignant neoplasm of breast: Secondary | ICD-10-CM | POA: Insufficient documentation

## 2017-06-24 DIAGNOSIS — H2511 Age-related nuclear cataract, right eye: Secondary | ICD-10-CM | POA: Diagnosis not present

## 2017-06-24 DIAGNOSIS — Z955 Presence of coronary angioplasty implant and graft: Secondary | ICD-10-CM | POA: Diagnosis not present

## 2017-06-24 DIAGNOSIS — Z8673 Personal history of transient ischemic attack (TIA), and cerebral infarction without residual deficits: Secondary | ICD-10-CM | POA: Insufficient documentation

## 2017-06-24 DIAGNOSIS — E119 Type 2 diabetes mellitus without complications: Secondary | ICD-10-CM | POA: Diagnosis not present

## 2017-06-24 DIAGNOSIS — I252 Old myocardial infarction: Secondary | ICD-10-CM | POA: Diagnosis not present

## 2017-06-24 DIAGNOSIS — I10 Essential (primary) hypertension: Secondary | ICD-10-CM | POA: Insufficient documentation

## 2017-06-24 DIAGNOSIS — I2510 Atherosclerotic heart disease of native coronary artery without angina pectoris: Secondary | ICD-10-CM | POA: Insufficient documentation

## 2017-06-24 HISTORY — PX: CATARACT EXTRACTION W/PHACO: SHX586

## 2017-06-24 HISTORY — DX: Presence of dental prosthetic device (complete) (partial): Z97.2

## 2017-06-24 LAB — GLUCOSE, CAPILLARY
Glucose-Capillary: 136 mg/dL — ABNORMAL HIGH (ref 65–99)
Glucose-Capillary: 132 mg/dL — ABNORMAL HIGH (ref 65–99)

## 2017-06-24 SURGERY — PHACOEMULSIFICATION, CATARACT, WITH IOL INSERTION
Anesthesia: Monitor Anesthesia Care | Site: Eye | Laterality: Right | Wound class: Clean

## 2017-06-24 MED ORDER — BALANCED SALT IO SOLN
INTRAOCULAR | Status: DC | PRN
  Administered 2017-06-24: 1 mL

## 2017-06-24 MED ORDER — BRIMONIDINE TARTRATE-TIMOLOL 0.2-0.5 % OP SOLN
OPHTHALMIC | Status: DC | PRN
  Administered 2017-06-24: 1 [drp] via OPHTHALMIC

## 2017-06-24 MED ORDER — ARMC OPHTHALMIC DILATING DROPS
1.0000 "application " | OPHTHALMIC | Status: DC | PRN
  Administered 2017-06-24 (×3): 1 via OPHTHALMIC

## 2017-06-24 MED ORDER — BSS IO SOLN
INTRAOCULAR | Status: DC | PRN
  Administered 2017-06-24: 67 mL via OPHTHALMIC

## 2017-06-24 MED ORDER — CEFUROXIME OPHTHALMIC INJECTION 1 MG/0.1 ML
INJECTION | OPHTHALMIC | Status: DC | PRN
  Administered 2017-06-24: 0.1 mL via INTRACAMERAL

## 2017-06-24 MED ORDER — LACTATED RINGERS IV SOLN: INTRAVENOUS | Status: DC

## 2017-06-24 MED ORDER — MIDAZOLAM HCL 2 MG/2ML IJ SOLN
INTRAMUSCULAR | Status: DC | PRN
  Administered 2017-06-24 (×2): 1 mg via INTRAVENOUS

## 2017-06-24 MED ORDER — MOXIFLOXACIN HCL 0.5 % OP SOLN
1.0000 [drp] | OPHTHALMIC | Status: DC | PRN
  Administered 2017-06-24 (×3): 1 [drp] via OPHTHALMIC

## 2017-06-24 MED ORDER — FENTANYL CITRATE (PF) 100 MCG/2ML IJ SOLN
INTRAMUSCULAR | Status: DC | PRN
  Administered 2017-06-24: 50 ug via INTRAVENOUS

## 2017-06-24 MED ORDER — NA HYALUR & NA CHOND-NA HYALUR 0.4-0.35 ML IO KIT
PACK | INTRAOCULAR | Status: DC | PRN
  Administered 2017-06-24: 1 mL via INTRAOCULAR

## 2017-06-24 SURGICAL SUPPLY — 20 items
CANNULA ANT/CHMB 27G (MISCELLANEOUS) ×1 IMPLANT
CANNULA ANT/CHMB 27GA (MISCELLANEOUS) ×3 IMPLANT
GLOVE SURG LX 7.5 STRW (GLOVE) ×2
GLOVE SURG LX STRL 7.5 STRW (GLOVE) ×1 IMPLANT
GLOVE SURG TRIUMPH 8.0 PF LTX (GLOVE) ×3 IMPLANT
GOWN STRL REUS W/ TWL LRG LVL3 (GOWN DISPOSABLE) ×2 IMPLANT
GOWN STRL REUS W/TWL LRG LVL3 (GOWN DISPOSABLE) ×6
LENS IOL TECNIS ITEC 16.5 (Intraocular Lens) ×2 IMPLANT
MARKER SKIN DUAL TIP RULER LAB (MISCELLANEOUS) ×3 IMPLANT
NDL FILTER BLUNT 18X1 1/2 (NEEDLE) ×1 IMPLANT
NEEDLE FILTER BLUNT 18X 1/2SAF (NEEDLE) ×2
NEEDLE FILTER BLUNT 18X1 1/2 (NEEDLE) ×1 IMPLANT
PACK CATARACT BRASINGTON (MISCELLANEOUS) ×3 IMPLANT
PACK EYE AFTER SURG (MISCELLANEOUS) ×3 IMPLANT
PACK OPTHALMIC (MISCELLANEOUS) ×3 IMPLANT
SYR 3ML LL SCALE MARK (SYRINGE) ×3 IMPLANT
SYR 5ML LL (SYRINGE) ×3 IMPLANT
SYR TB 1ML LUER SLIP (SYRINGE) ×3 IMPLANT
WATER STERILE IRR 500ML POUR (IV SOLUTION) ×3 IMPLANT
WIPE NON LINTING 3.25X3.25 (MISCELLANEOUS) ×3 IMPLANT

## 2017-06-24 NOTE — Transfer of Care (Signed)
Immediate Anesthesia Transfer of Care Note  Patient: Elaine Cox  Procedure(s) Performed: CATARACT EXTRACTION PHACO AND INTRAOCULAR LENS PLACEMENT (IOC) COMPLICATED RIGHT DIABETIC (Right Eye)  Patient Location: PACU  Anesthesia Type: MAC  Level of Consciousness: awake, alert  and patient cooperative  Airway and Oxygen Therapy: Patient Spontanous Breathing and Patient connected to supplemental oxygen  Post-op Assessment: Post-op Vital signs reviewed, Patient's Cardiovascular Status Stable, Respiratory Function Stable, Patent Airway and No signs of Nausea or vomiting  Post-op Vital Signs: Reviewed and stable  Complications: No apparent anesthesia complications

## 2017-06-24 NOTE — Anesthesia Procedure Notes (Signed)
Procedure Name: MAC Date/Time: 06/24/2017 8:18 AM Performed by: Cameron Ali, CRNA Pre-anesthesia Checklist: Patient identified, Emergency Drugs available, Suction available, Timeout performed and Patient being monitored Patient Re-evaluated:Patient Re-evaluated prior to induction Oxygen Delivery Method: Nasal cannula Placement Confirmation: positive ETCO2

## 2017-06-24 NOTE — Anesthesia Preprocedure Evaluation (Signed)
Anesthesia Evaluation  Patient identified by MRN, date of birth, ID band Patient awake    Reviewed: Allergy & Precautions, H&P , NPO status , Patient's Chart, lab work & pertinent test results  Airway Mallampati: III  TM Distance: >3 FB Neck ROM: full    Dental  (+) Upper Dentures, Lower Dentures   Pulmonary shortness of breath and with exertion,    Pulmonary exam normal breath sounds clear to auscultation       Cardiovascular hypertension, + angina + CAD and + Past MI  Normal cardiovascular exam Rhythm:regular Rate:Normal     Neuro/Psych CVA    GI/Hepatic GERD  ,  Endo/Other  diabetes  Renal/GU      Musculoskeletal   Abdominal   Peds  Hematology   Anesthesia Other Findings   Reproductive/Obstetrics                             Anesthesia Physical Anesthesia Plan  ASA: III  Anesthesia Plan: MAC   Post-op Pain Management:    Induction:   PONV Risk Score and Plan: 2 and Treatment may vary due to age or medical condition  Airway Management Planned:   Additional Equipment:   Intra-op Plan:   Post-operative Plan:   Informed Consent: I have reviewed the patients History and Physical, chart, labs and discussed the procedure including the risks, benefits and alternatives for the proposed anesthesia with the patient or authorized representative who has indicated his/her understanding and acceptance.     Plan Discussed with: CRNA  Anesthesia Plan Comments:         Anesthesia Quick Evaluation

## 2017-06-24 NOTE — H&P (Signed)
The History and Physical notes are on paper, have been signed, and are to be scanned. The patient remains stable and unchanged from the H&P.   Previous H&P reviewed, patient examined, and there are no changes.  Tamra Koos 06/24/2017 7:39 AM

## 2017-06-24 NOTE — Op Note (Signed)
LOCATION:  [place]   PREOPERATIVE DIAGNOSIS:    Nuclear sclerotic cataract right eye. H25.11   POSTOPERATIVE DIAGNOSIS:  Nuclear sclerotic cataract right eye.     PROCEDURE:  Phacoemusification with posterior chamber intraocular lens placement of the right eye   LENS:   Implant Name Type Inv. Item Serial No. Manufacturer Lot No. LRB No. Used  LENS IOL DIOP 16.5 - [id number] Intraocular Lens LENS IOL DIOP 16.5 [id number]  Right 1        ULTRASOUND TIME: 13 % of 1 minutes, 14 seconds.  CDE 9.6   SURGEON:  Wyonia Hough, MD   ANESTHESIA:  Topical with tetracaine drops and 2% Xylocaine jelly, augmented with 1% preservative-free intracameral lidocaine.    COMPLICATIONS:  None.   DESCRIPTION OF PROCEDURE:  The patient was identified in the holding room and transported to the operating room and placed in the supine position under the operating microscope.  The right eye was identified as the operative eye and it was prepped and draped in the usual sterile ophthalmic fashion.   A 1 millimeter clear-corneal paracentesis was made at the 12:00 position.  0.5 ml of preservative-free 1% lidocaine was injected into the anterior chamber. The anterior chamber was filled with Viscoat viscoelastic.  A 2.4 millimeter keratome was used to make a near-clear corneal incision at the 9:00 position.  A curvilinear capsulorrhexis was made with a cystotome and capsulorrhexis forceps.  Balanced salt solution was used to hydrodissect and hydrodelineate the nucleus.   Phacoemulsification was then used in stop and chop fashion to remove the lens nucleus and epinucleus.  The remaining cortex was then removed using the irrigation and aspiration handpiece. Provisc was then placed into the capsular bag to distend it for lens placement.  A lens was then injected into the capsular bag.  The remaining viscoelastic was aspirated.   Wounds were hydrated with balanced salt solution.  The anterior  chamber was inflated to a physiologic pressure with balanced salt solution.  No wound leaks were noted. Cefuroxime 0.1 ml of a 10mg /ml solution was injected into the anterior chamber for a dose of 1 mg of intracameral antibiotic at the completion of the case.   Timolol and Brimonidine drops were applied to the eye.  The patient was taken to the recovery room in stable condition without complications of anesthesia or surgery.   Olander Friedl 06/24/2017, 8:34 AM

## 2017-06-24 NOTE — Anesthesia Postprocedure Evaluation (Signed)
Anesthesia Post Note  Patient: Elaine Cox  Procedure(s) Performed: CATARACT EXTRACTION PHACO AND INTRAOCULAR LENS PLACEMENT (IOC) COMPLICATED RIGHT DIABETIC (Right Eye)  Patient location during evaluation: PACU Anesthesia Type: MAC Level of consciousness: awake and alert and oriented Pain management: satisfactory to patient Vital Signs Assessment: post-procedure vital signs reviewed and stable Respiratory status: spontaneous breathing, nonlabored ventilation and respiratory function stable Cardiovascular status: blood pressure returned to baseline and stable Postop Assessment: Adequate PO intake and No signs of nausea or vomiting Anesthetic complications: no    Raliegh Ip

## 2017-06-25 ENCOUNTER — Encounter: Payer: Self-pay | Admitting: Ophthalmology

## 2017-08-05 DIAGNOSIS — Z961 Presence of intraocular lens: Secondary | ICD-10-CM | POA: Diagnosis not present

## 2017-08-28 DIAGNOSIS — M9905 Segmental and somatic dysfunction of pelvic region: Secondary | ICD-10-CM | POA: Diagnosis not present

## 2017-08-28 DIAGNOSIS — M955 Acquired deformity of pelvis: Secondary | ICD-10-CM | POA: Diagnosis not present

## 2017-08-28 DIAGNOSIS — M9903 Segmental and somatic dysfunction of lumbar region: Secondary | ICD-10-CM | POA: Diagnosis not present

## 2017-08-28 DIAGNOSIS — M5416 Radiculopathy, lumbar region: Secondary | ICD-10-CM | POA: Diagnosis not present

## 2017-09-10 ENCOUNTER — Other Ambulatory Visit: Payer: Self-pay | Admitting: Internal Medicine

## 2017-09-10 DIAGNOSIS — Z1231 Encounter for screening mammogram for malignant neoplasm of breast: Secondary | ICD-10-CM

## 2017-09-22 DIAGNOSIS — Z4431 Encounter for fitting and adjustment of external right breast prosthesis: Secondary | ICD-10-CM | POA: Diagnosis not present

## 2017-09-22 DIAGNOSIS — C50111 Malignant neoplasm of central portion of right female breast: Secondary | ICD-10-CM | POA: Diagnosis not present

## 2017-09-23 DIAGNOSIS — M150 Primary generalized (osteo)arthritis: Secondary | ICD-10-CM | POA: Diagnosis not present

## 2017-09-23 DIAGNOSIS — R7611 Nonspecific reaction to tuberculin skin test without active tuberculosis: Secondary | ICD-10-CM | POA: Diagnosis not present

## 2017-09-23 DIAGNOSIS — Z79899 Other long term (current) drug therapy: Secondary | ICD-10-CM | POA: Diagnosis not present

## 2017-09-23 DIAGNOSIS — M0579 Rheumatoid arthritis with rheumatoid factor of multiple sites without organ or systems involvement: Secondary | ICD-10-CM | POA: Diagnosis not present

## 2017-09-23 DIAGNOSIS — M25561 Pain in right knee: Secondary | ICD-10-CM | POA: Diagnosis not present

## 2017-09-23 DIAGNOSIS — M1711 Unilateral primary osteoarthritis, right knee: Secondary | ICD-10-CM | POA: Insufficient documentation

## 2017-09-25 DIAGNOSIS — C50111 Malignant neoplasm of central portion of right female breast: Secondary | ICD-10-CM | POA: Diagnosis not present

## 2017-09-25 DIAGNOSIS — Z4431 Encounter for fitting and adjustment of external right breast prosthesis: Secondary | ICD-10-CM | POA: Diagnosis not present

## 2017-10-21 ENCOUNTER — Ambulatory Visit
Admission: RE | Admit: 2017-10-21 | Discharge: 2017-10-21 | Disposition: A | Discharge status: Home / Self Care | Payer: Medicare HMO | Source: Ambulatory Visit | Attending: Internal Medicine | Admitting: Internal Medicine

## 2017-10-21 DIAGNOSIS — Z1231 Encounter for screening mammogram for malignant neoplasm of breast: Secondary | ICD-10-CM | POA: Insufficient documentation

## 2017-10-27 DIAGNOSIS — M0579 Rheumatoid arthritis with rheumatoid factor of multiple sites without organ or systems involvement: Secondary | ICD-10-CM | POA: Diagnosis not present

## 2017-10-27 DIAGNOSIS — M05722 Rheumatoid arthritis with rheumatoid factor of left elbow without organ or systems involvement: Secondary | ICD-10-CM | POA: Diagnosis not present

## 2017-10-27 DIAGNOSIS — E119 Type 2 diabetes mellitus without complications: Secondary | ICD-10-CM | POA: Diagnosis not present

## 2017-10-27 DIAGNOSIS — Z79899 Other long term (current) drug therapy: Secondary | ICD-10-CM | POA: Diagnosis not present

## 2017-10-27 DIAGNOSIS — F419 Anxiety disorder, unspecified: Secondary | ICD-10-CM | POA: Diagnosis not present

## 2017-10-27 DIAGNOSIS — M05721 Rheumatoid arthritis with rheumatoid factor of right elbow without organ or systems involvement: Secondary | ICD-10-CM | POA: Diagnosis not present

## 2017-10-27 DIAGNOSIS — I2510 Atherosclerotic heart disease of native coronary artery without angina pectoris: Secondary | ICD-10-CM | POA: Diagnosis not present

## 2017-10-27 DIAGNOSIS — I10 Essential (primary) hypertension: Secondary | ICD-10-CM | POA: Diagnosis not present

## 2017-10-29 DIAGNOSIS — B370 Candidal stomatitis: Secondary | ICD-10-CM | POA: Diagnosis not present

## 2017-10-30 DIAGNOSIS — Z86010 Personal history of colonic polyps: Secondary | ICD-10-CM | POA: Diagnosis not present

## 2017-11-11 DIAGNOSIS — I2510 Atherosclerotic heart disease of native coronary artery without angina pectoris: Secondary | ICD-10-CM | POA: Diagnosis not present

## 2017-11-11 DIAGNOSIS — I10 Essential (primary) hypertension: Secondary | ICD-10-CM | POA: Diagnosis not present

## 2017-11-11 DIAGNOSIS — Z0000 Encounter for general adult medical examination without abnormal findings: Secondary | ICD-10-CM | POA: Diagnosis not present

## 2017-11-11 DIAGNOSIS — M059 Rheumatoid arthritis with rheumatoid factor, unspecified: Secondary | ICD-10-CM | POA: Diagnosis not present

## 2017-11-11 DIAGNOSIS — F419 Anxiety disorder, unspecified: Secondary | ICD-10-CM | POA: Diagnosis not present

## 2017-11-11 DIAGNOSIS — E7849 Other hyperlipidemia: Secondary | ICD-10-CM | POA: Diagnosis not present

## 2017-11-11 DIAGNOSIS — E119 Type 2 diabetes mellitus without complications: Secondary | ICD-10-CM | POA: Diagnosis not present

## 2017-12-04 ENCOUNTER — Encounter: Payer: Self-pay | Admitting: *Deleted

## 2017-12-08 ENCOUNTER — Ambulatory Visit: Payer: Medicare HMO | Admitting: Anesthesiology

## 2017-12-08 ENCOUNTER — Encounter: Payer: Self-pay | Admitting: *Deleted

## 2017-12-08 ENCOUNTER — Encounter
Admission: RE | Disposition: A | Discharge status: Home / Self Care | Payer: Self-pay | Source: Ambulatory Visit | Attending: Internal Medicine

## 2017-12-08 ENCOUNTER — Ambulatory Visit
Admission: RE | Admit: 2017-12-08 | Discharge: 2017-12-08 | Disposition: A | Discharge status: Home / Self Care | Payer: Medicare HMO | Source: Ambulatory Visit | Attending: Internal Medicine | Admitting: Internal Medicine

## 2017-12-08 DIAGNOSIS — K640 First degree hemorrhoids: Secondary | ICD-10-CM | POA: Diagnosis not present

## 2017-12-08 DIAGNOSIS — I2510 Atherosclerotic heart disease of native coronary artery without angina pectoris: Secondary | ICD-10-CM | POA: Diagnosis not present

## 2017-12-08 DIAGNOSIS — M069 Rheumatoid arthritis, unspecified: Secondary | ICD-10-CM | POA: Insufficient documentation

## 2017-12-08 DIAGNOSIS — K649 Unspecified hemorrhoids: Secondary | ICD-10-CM | POA: Diagnosis not present

## 2017-12-08 DIAGNOSIS — K635 Polyp of colon: Secondary | ICD-10-CM | POA: Diagnosis not present

## 2017-12-08 DIAGNOSIS — Z7982 Long term (current) use of aspirin: Secondary | ICD-10-CM | POA: Insufficient documentation

## 2017-12-08 DIAGNOSIS — E119 Type 2 diabetes mellitus without complications: Secondary | ICD-10-CM | POA: Diagnosis not present

## 2017-12-08 DIAGNOSIS — F419 Anxiety disorder, unspecified: Secondary | ICD-10-CM | POA: Insufficient documentation

## 2017-12-08 DIAGNOSIS — D122 Benign neoplasm of ascending colon: Secondary | ICD-10-CM | POA: Diagnosis not present

## 2017-12-08 DIAGNOSIS — Z7984 Long term (current) use of oral hypoglycemic drugs: Secondary | ICD-10-CM | POA: Diagnosis not present

## 2017-12-08 DIAGNOSIS — Z882 Allergy status to sulfonamides status: Secondary | ICD-10-CM | POA: Diagnosis not present

## 2017-12-08 DIAGNOSIS — D123 Benign neoplasm of transverse colon: Secondary | ICD-10-CM | POA: Diagnosis not present

## 2017-12-08 DIAGNOSIS — K219 Gastro-esophageal reflux disease without esophagitis: Secondary | ICD-10-CM | POA: Diagnosis not present

## 2017-12-08 DIAGNOSIS — D175 Benign lipomatous neoplasm of intra-abdominal organs: Secondary | ICD-10-CM | POA: Diagnosis not present

## 2017-12-08 DIAGNOSIS — E785 Hyperlipidemia, unspecified: Secondary | ICD-10-CM | POA: Insufficient documentation

## 2017-12-08 DIAGNOSIS — Z86010 Personal history of colonic polyps: Secondary | ICD-10-CM | POA: Diagnosis not present

## 2017-12-08 DIAGNOSIS — I10 Essential (primary) hypertension: Secondary | ICD-10-CM | POA: Insufficient documentation

## 2017-12-08 DIAGNOSIS — I252 Old myocardial infarction: Secondary | ICD-10-CM | POA: Diagnosis not present

## 2017-12-08 DIAGNOSIS — Z8673 Personal history of transient ischemic attack (TIA), and cerebral infarction without residual deficits: Secondary | ICD-10-CM | POA: Diagnosis not present

## 2017-12-08 DIAGNOSIS — D120 Benign neoplasm of cecum: Secondary | ICD-10-CM | POA: Diagnosis not present

## 2017-12-08 DIAGNOSIS — D125 Benign neoplasm of sigmoid colon: Secondary | ICD-10-CM | POA: Diagnosis not present

## 2017-12-08 DIAGNOSIS — Z1211 Encounter for screening for malignant neoplasm of colon: Secondary | ICD-10-CM | POA: Diagnosis not present

## 2017-12-08 HISTORY — PX: COLONOSCOPY WITH PROPOFOL: SHX5780

## 2017-12-08 HISTORY — DX: Acquired absence of both cervix and uterus: Z90.710

## 2017-12-08 HISTORY — DX: Other specified postprocedural states: Z98.890

## 2017-12-08 LAB — GLUCOSE, CAPILLARY: Glucose-Capillary: 141 mg/dL — ABNORMAL HIGH (ref 70–99)

## 2017-12-08 SURGERY — COLONOSCOPY WITH PROPOFOL
Anesthesia: General

## 2017-12-08 MED ORDER — LIDOCAINE HCL (PF) 2 % IJ SOLN
INTRAMUSCULAR | Status: AC
  Filled 2017-12-08: qty 10

## 2017-12-08 MED ORDER — PROPOFOL 10 MG/ML IV BOLUS
INTRAVENOUS | Status: AC
  Filled 2017-12-08: qty 20

## 2017-12-08 MED ORDER — LIDOCAINE HCL (CARDIAC) PF 100 MG/5ML IV SOSY
PREFILLED_SYRINGE | INTRAVENOUS | Status: DC | PRN
  Administered 2017-12-08: 60 mg via INTRAVENOUS

## 2017-12-08 MED ORDER — PROPOFOL 500 MG/50ML IV EMUL
INTRAVENOUS | Status: AC
  Filled 2017-12-08: qty 50

## 2017-12-08 MED ORDER — SODIUM CHLORIDE 0.9 % IV SOLN
INTRAVENOUS | Status: DC
  Administered 2017-12-08: 10:00:00 via INTRAVENOUS

## 2017-12-08 MED ORDER — PROPOFOL 10 MG/ML IV BOLUS
INTRAVENOUS | Status: DC | PRN
  Administered 2017-12-08: 40 mg via INTRAVENOUS
  Administered 2017-12-08: 10 mg via INTRAVENOUS

## 2017-12-08 MED ORDER — PROPOFOL 500 MG/50ML IV EMUL
INTRAVENOUS | Status: DC | PRN
  Administered 2017-12-08: 140 ug/kg/min via INTRAVENOUS

## 2017-12-08 NOTE — H&P (Signed)
Outpatient short stay form Pre-procedure 12/08/2017 8:54 AM Teodoro K. Alice Reichert, M.D.  Primary Physician: Tracie Harrier, M.D.  Reason for visit:  Personal hx of colon polyps.  History of present illness:  Patient isa 72 y/o female presenting for personal hx of colon polyps. Patient denies change in bowel habits, rectal bleeding, weight loss or abdominal pain.     Current Facility-Administered Medications:  .  0.9 %  sodium chloride infusion, , Intravenous, Continuous, [place], Benay Pike, MD  Medications Prior to Admission  Medication Sig Dispense Refill Last Dose  . ALPRAZolam (XANAX) 0.25 MG tablet Take 0.5 mg by mouth at bedtime.    12/07/2017 at Unknown time  . amLODipine (NORVASC) 5 MG tablet Take 5 mg by mouth 2 (two) times daily.    12/08/2017 at Unknown time  . aspirin EC 81 MG tablet Take 81 mg by mouth daily.   Past Week at Unknown time  . BIOTIN PO Take 1 tablet by mouth daily.   Past Week at Unknown time  . bisacodyl (DULCOLAX) 5 MG EC tablet Take 5 mg by mouth daily as needed for moderate constipation.   12/07/2017 at Unknown time  . diphenhydrAMINE (BENADRYL) 25 MG tablet Take 25 mg by mouth daily as needed for allergies.   Past Week at Unknown time  . hydrochlorothiazide (HYDRODIURIL) 25 MG tablet Take 25 mg by mouth once daily   12/07/2017 at Unknown time  . hydrOXYzine (ATARAX/VISTARIL) 10 MG tablet Take 10 mg by mouth daily.    12/07/2017 at Unknown time  . ibuprofen (ADVIL,MOTRIN) 200 MG tablet Take 600 mg by mouth 2 (two) times daily as needed for moderate pain.   12/07/2017 at Unknown time  . Menthol-Methyl Salicylate (MUSCLE RUB) 10-15 % CREA Apply 1 application topically as needed for muscle pain.   12/07/2017 at Unknown time  . metFORMIN (GLUCOPHAGE) 500 MG tablet Take 500 mg by mouth 2 (two) times daily with a meal.   12/07/2017 at Unknown time  . metoprolol succinate (TOPROL-XL) 100 MG 24 hr tablet Take 50 mg by mouth every evening. Take with or immediately following a meal.     12/07/2017 at Unknown time  . nitroGLYCERIN (NITROSTAT) 0.4 MG SL tablet Place 0.4 mg under the tongue every 5 (five) minutes as needed for chest pain.    12/07/2017 at Unknown time  . nystatin (MYCOSTATIN) 100000 UNIT/ML suspension Take 5 mLs by mouth 4 (four) times daily.   12/07/2017 at Unknown time  . omeprazole (PRILOSEC) 20 MG capsule Take 20 mg by mouth daily.   12/07/2017 at Unknown time  . Polyvinyl Alcohol-Povidone (MURINE TEARS FOR DRY EYES OP) Apply 1 drop to eye daily as needed (dry eyes).   12/07/2017 at Unknown time  . potassium chloride (K-DUR,KLOR-CON) 10 MEQ tablet Take 10 mEq by mouth daily.    12/07/2017 at Unknown time  . tiZANidine (ZANAFLEX) 2 MG tablet Take 2 mg by mouth 3 (three) times daily as needed for muscle spasms.   12/07/2017 at Unknown time  . Tofacitinib Citrate (XELJANZ) 5 MG TABS Take 5 mg by mouth at bedtime.    12/07/2017 at Unknown time  . fluconazole (DIFLUCAN) 150 MG tablet Take 150 mg by mouth daily.   Completed Course at Unknown time     Allergies  Allergen Reactions  . Doxazosin Other (See Comments)    "headache"  . Fish Allergy Anaphylaxis, Itching, Nausea Only and Swelling    She states her tongue turns black.  . Dicloxacillin Nausea  And Vomiting    Has patient had a PCN reaction causing immediate rash, facial/tongue/throat swelling, SOB or lightheadedness with hypotension: No Has patient had a PCN reaction causing severe rash involving mucus membranes or skin necrosis: No Has patient had a PCN reaction that required hospitalization: Unknown Has patient had a PCN reaction occurring within the last 10 years: Unknown If all of the above answers are "NO", then may proceed with Cephalosporin use.   . Iodine Hives    Per patient, she had a previous reaction/hives from an iodine injection  . Sulfa Antibiotics Swelling    Loss of appetite   Constipation bloating  . Bactrim [Sulfamethoxazole-Trimethoprim] Nausea Only  . Beta Adrenergic Blockers Other (See Comments)     "Unknown"  . Cardura [Doxazosin Mesylate]     Headache   . Etodolac Itching and Swelling  . Azithromycin Itching  . Clopidogrel Nausea And Vomiting  . Colchicine Nausea Only and Other (See Comments)    Dizzness  . Glucosamine Nausea And Vomiting  . Leflunomide Diarrhea and Itching  . Meloxicam Itching  . Methotrexate Nausea Only  . Zoloft [Sertraline] Nausea And Vomiting     Past Medical History:  Diagnosis Date  . Anginal pain ([facility]) 06/2016   being followed by dr. Ubaldo Glassing  . Anxiety   . Arthritis    Osteoarthritis  . Arthritis    Rheumatoid  . Breast cancer ([place]) 2000   Right Breast - Chemotherapy  . 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  . Coronary artery disease   . Diabetes mellitus without complication [facility])    Patient takes Metformin  . Dyspnea 03/2017  . GERD (gastroesophageal reflux disease)   . History of abdominal hysterectomy   . History of eyelid surgery   . History of kidney stones   . Hyperlipidemia   . Hypertension   . Lumbago   . Myocardial infarction ([place]) 07/2010  . Orthopnea 03/2017  . Personal history of chemotherapy 2000   right breast ca  . Sinus disorder   . Stroke [facility]) 2006, 2012  . Wears dentures    full upper and lower    Review of systems:  Otherwise negative.    Physical Exam  Gen: Alert, oriented. Appears stated age.  HEENT: [place]/AT. PERRLA. Lungs: CTA, no wheezes. CV: RR nl S1, S2. Abd: soft, benign, no masses. BS+ Ext: No edema. Pulses 2+    Planned procedures: Proceed with colonoscopy. The patient understands the nature of the planned procedure, indications, risks, alternatives and potential complications including but not limited to bleeding, infection, perforation, damage to internal organs and possible oversedation/side effects from anesthesia. The patient agrees and gives consent to proceed.  Please refer to procedure notes for findings, recommendations and patient disposition/instructions.      Teodoro K. Alice Reichert, M.D. Gastroenterology 12/08/2017  8:54 AM

## 2017-12-08 NOTE — Anesthesia Procedure Notes (Signed)
Date/Time: 12/08/2017 9:40 AM Performed by: Allean Found, CRNA Pre-anesthesia Checklist: Patient identified, Emergency Drugs available, Suction available, Patient being monitored and Timeout performed Patient Re-evaluated:Patient Re-evaluated prior to induction Oxygen Delivery Method: Nasal cannula Placement Confirmation: positive ETCO2

## 2017-12-08 NOTE — Anesthesia Post-op Follow-up Note (Signed)
Anesthesia QCDR form completed.        

## 2017-12-08 NOTE — Transfer of Care (Signed)
Immediate Anesthesia Transfer of Care Note  Patient: CATHRINE KRIZAN  Procedure(s) Performed: COLONOSCOPY WITH PROPOFOL (N/A )  Patient Location: PACU  Anesthesia Type:General  Level of Consciousness: sedated  Airway & Oxygen Therapy: Patient Spontanous Breathing and Patient connected to nasal cannula oxygen  Post-op Assessment: Report given to RN and Post -op Vital signs reviewed and stable  Post vital signs: Reviewed and stable  Last Vitals:  Vitals Value Taken Time  BP    Temp    Pulse 63 12/08/2017 10:08 AM  Resp 25 12/08/2017 10:08 AM  SpO2 100 % 12/08/2017 10:08 AM  Vitals shown include unvalidated device data.  Last Pain:  Vitals:   12/08/17 0747  TempSrc: Tympanic      Patients Stated Pain Goal: 0 (19/50/93 2671)  Complications: No apparent anesthesia complications

## 2017-12-08 NOTE — Interval H&P Note (Signed)
History and Physical Interval Note:  12/08/2017 8:55 AM  Elaine Cox  has presented today for surgery, with the diagnosis of PHX COLON POLYPS  The various methods of treatment have been discussed with the patient and family. After consideration of risks, benefits and other options for treatment, the patient has consented to  Procedure(s): COLONOSCOPY WITH PROPOFOL (N/A) as a surgical intervention .  The patient's history has been reviewed, patient examined, no change in status, stable for surgery.  I have reviewed the patient's chart and labs.  Questions were answered to the patient's satisfaction.     [place], [place]

## 2017-12-08 NOTE — Anesthesia Preprocedure Evaluation (Signed)
Anesthesia Evaluation  Patient identified by MRN, date of birth, ID band Patient awake    Reviewed: Allergy & Precautions, NPO status , Patient's Chart, lab work & pertinent test results, reviewed documented beta blocker date and time   Airway Mallampati: III  TM Distance: >3 FB     Dental  (+) Upper Dentures, Lower Dentures   Pulmonary shortness of breath,           Cardiovascular hypertension, Pt. on medications and Pt. on home beta blockers + angina + CAD, + Past MI, + Cardiac Stents and + Orthopnea       Neuro/Psych Anxiety TIACVA    GI/Hepatic GERD  ,  Endo/Other  diabetes, Type 2  Renal/GU      Musculoskeletal  (+) Arthritis ,   Abdominal   Peds  Hematology   Anesthesia Other Findings Sounds like she had a TIA, rather than a stroke.  Reproductive/Obstetrics                             Anesthesia Physical Anesthesia Plan  ASA: III  Anesthesia Plan: General   Post-op Pain Management:    Induction: Intravenous  PONV Risk Score and Plan:   Airway Management Planned:   Additional Equipment:   Intra-op Plan:   Post-operative Plan:   Informed Consent: I have reviewed the patients History and Physical, chart, labs and discussed the procedure including the risks, benefits and alternatives for the proposed anesthesia with the patient or authorized representative who has indicated his/her understanding and acceptance.     Plan Discussed with: CRNA  Anesthesia Plan Comments:         Anesthesia Quick Evaluation

## 2017-12-08 NOTE — Anesthesia Postprocedure Evaluation (Signed)
Anesthesia Post Note  Patient: Elaine Cox  Procedure(s) Performed: COLONOSCOPY WITH PROPOFOL (N/A )  Patient location during evaluation: Endoscopy Anesthesia Type: General Level of consciousness: awake and alert Pain management: pain level controlled Vital Signs Assessment: post-procedure vital signs reviewed and stable Respiratory status: spontaneous breathing, nonlabored ventilation, respiratory function stable and patient connected to nasal cannula oxygen Cardiovascular status: blood pressure returned to baseline and stable Postop Assessment: no apparent nausea or vomiting Anesthetic complications: no     Last Vitals:  Vitals:   12/08/17 1020 12/08/17 1030  BP: 123/61 128/69  Pulse: (!) 59 (!) 58  Resp: 13 13  Temp:    SpO2: 100% 100%    Last Pain:  Vitals:   12/08/17 1000  TempSrc: Tympanic                 [name] S

## 2017-12-08 NOTE — Op Note (Signed)
[facility] Gastroenterology Patient Name: Elaine Cox Procedure Date: 12/08/2017 9:38 AM MRN: [id number] Account #: [id number] Date of Birth: [birth year] Admit Type: Outpatient Age: 72 Room: [facility] ENDO ROOM 2 Gender: Female Note Status: Finalized Procedure:            Colonoscopy Indications:          High risk colon cancer surveillance: Personal history                        of colonic polyps Providers:            Benay Pike. Alice Reichert MD, MD Referring MD:         Tracie Harrier, MD (Referring MD) Medicines:            Propofol per Anesthesia Complications:        No immediate complications. Procedure:            Pre-Anesthesia Assessment:                       - The risks and benefits of the procedure and the                        sedation options and risks were discussed with the                        patient. All questions were answered and informed                        consent was obtained.                       - Patient identification and proposed procedure were                        verified prior to the procedure by the nurse. The                        procedure was verified in the procedure room.                       - ASA Grade Assessment: III - A patient with severe                        systemic disease.                       - After reviewing the risks and benefits, the patient                        was deemed in satisfactory condition to undergo the                        procedure.                       After obtaining informed consent, the colonoscope was                        passed under direct vision. Throughout the procedure,  the patient's blood pressure, pulse, and oxygen                        saturations were monitored continuously. The                        Colonoscope was introduced through the anus and                        advanced to the the cecum, identified by appendiceal   orifice and ileocecal valve. The colonoscopy was                        performed without difficulty. The patient tolerated the                        procedure well. The quality of the bowel preparation                        was good. The ileocecal valve, appendiceal orifice, and                        rectum were photographed. Findings:      The perianal and digital rectal examinations were normal. Pertinent       negatives include normal sphincter tone and no palpable rectal lesions.      Five sessile polyps were found in the sigmoid colon, distal transverse       colon, ascending colon and cecum. The polyps were 3 to 5 mm in size.       These polyps were removed with a jumbo cold forceps. Resection and       retrieval were complete.      Non-bleeding internal hemorrhoids were found during retroflexion. The       hemorrhoids were Grade I (internal hemorrhoids that do not prolapse). Impression:           - Five 3 to 5 mm polyps in the sigmoid colon, in the                        distal transverse colon, in the ascending colon and in                        the cecum, removed with a jumbo cold forceps. Resected                        and retrieved.                       - Non-bleeding internal hemorrhoids. Recommendation:       - Repeat colonoscopy in 5 years for surveillance.                       - Return to GI office PRN. Procedure Code(s):    --- Professional ---                       [phone], Colonoscopy, flexible; with biopsy, single or                        multiple Diagnosis Code(s):    --- Professional ---  K64.0, First degree hemorrhoids                       D12.0, Benign neoplasm of cecum                       D12.2, Benign neoplasm of ascending colon                       D12.3, Benign neoplasm of transverse colon (hepatic                        flexure or splenic flexure)                       D12.5, Benign neoplasm of sigmoid colon                        Z86.010, Personal history of colonic polyps CPT copyright 2017 [employer]. All rights reserved. The codes documented in this report are preliminary and upon coder review may  be revised to meet current compliance requirements. Efrain Sella MD, MD 12/08/2017 10:04:40 AM This report has been signed electronically. Number of Addenda: 0 Note Initiated On: 12/08/2017 9:38 AM Scope Withdrawal Time: 0 hours 11 minutes 58 seconds  Total Procedure Duration: 0 hours 15 minutes 1 second       [facility]

## 2017-12-10 ENCOUNTER — Encounter: Payer: Self-pay | Admitting: Internal Medicine

## 2017-12-10 LAB — SURGICAL PATHOLOGY

## 2018-01-26 DIAGNOSIS — M059 Rheumatoid arthritis with rheumatoid factor, unspecified: Secondary | ICD-10-CM | POA: Diagnosis not present

## 2018-01-26 DIAGNOSIS — M1711 Unilateral primary osteoarthritis, right knee: Secondary | ICD-10-CM | POA: Diagnosis not present

## 2018-01-26 DIAGNOSIS — R7611 Nonspecific reaction to tuberculin skin test without active tuberculosis: Secondary | ICD-10-CM | POA: Diagnosis not present

## 2018-01-26 DIAGNOSIS — Z79899 Other long term (current) drug therapy: Secondary | ICD-10-CM | POA: Diagnosis not present

## 2018-02-01 DIAGNOSIS — E119 Type 2 diabetes mellitus without complications: Secondary | ICD-10-CM | POA: Diagnosis not present

## 2018-02-22 ENCOUNTER — Emergency Department: Payer: Medicare HMO

## 2018-02-22 ENCOUNTER — Other Ambulatory Visit: Payer: Self-pay

## 2018-02-22 ENCOUNTER — Encounter: Payer: Self-pay | Admitting: Emergency Medicine

## 2018-02-22 ENCOUNTER — Inpatient Hospital Stay
Admission: EM | Admit: 2018-02-22 | Discharge: 2018-02-25 | DRG: 247 | Disposition: A | Discharge status: Home / Self Care | Payer: Medicare HMO | Attending: Specialist | Admitting: Specialist

## 2018-02-22 DIAGNOSIS — I10 Essential (primary) hypertension: Secondary | ICD-10-CM | POA: Diagnosis present

## 2018-02-22 DIAGNOSIS — Z882 Allergy status to sulfonamides status: Secondary | ICD-10-CM

## 2018-02-22 DIAGNOSIS — Z803 Family history of malignant neoplasm of breast: Secondary | ICD-10-CM | POA: Diagnosis not present

## 2018-02-22 DIAGNOSIS — R748 Abnormal levels of other serum enzymes: Secondary | ICD-10-CM | POA: Diagnosis not present

## 2018-02-22 DIAGNOSIS — M069 Rheumatoid arthritis, unspecified: Secondary | ICD-10-CM | POA: Diagnosis not present

## 2018-02-22 DIAGNOSIS — Z9011 Acquired absence of right breast and nipple: Secondary | ICD-10-CM | POA: Diagnosis not present

## 2018-02-22 DIAGNOSIS — I081 Rheumatic disorders of both mitral and tricuspid valves: Secondary | ICD-10-CM | POA: Diagnosis present

## 2018-02-22 DIAGNOSIS — R778 Other specified abnormalities of plasma proteins: Secondary | ICD-10-CM

## 2018-02-22 DIAGNOSIS — I639 Cerebral infarction, unspecified: Secondary | ICD-10-CM | POA: Diagnosis not present

## 2018-02-22 DIAGNOSIS — R0601 Orthopnea: Secondary | ICD-10-CM | POA: Diagnosis present

## 2018-02-22 DIAGNOSIS — Z87442 Personal history of urinary calculi: Secondary | ICD-10-CM | POA: Diagnosis not present

## 2018-02-22 DIAGNOSIS — Z853 Personal history of malignant neoplasm of breast: Secondary | ICD-10-CM

## 2018-02-22 DIAGNOSIS — I252 Old myocardial infarction: Secondary | ICD-10-CM

## 2018-02-22 DIAGNOSIS — I2510 Atherosclerotic heart disease of native coronary artery without angina pectoris: Secondary | ICD-10-CM | POA: Diagnosis not present

## 2018-02-22 DIAGNOSIS — E119 Type 2 diabetes mellitus without complications: Secondary | ICD-10-CM

## 2018-02-22 DIAGNOSIS — R079 Chest pain, unspecified: Secondary | ICD-10-CM

## 2018-02-22 DIAGNOSIS — Z8673 Personal history of transient ischemic attack (TIA), and cerebral infarction without residual deficits: Secondary | ICD-10-CM | POA: Diagnosis not present

## 2018-02-22 DIAGNOSIS — Z955 Presence of coronary angioplasty implant and graft: Secondary | ICD-10-CM

## 2018-02-22 DIAGNOSIS — Z9221 Personal history of antineoplastic chemotherapy: Secondary | ICD-10-CM

## 2018-02-22 DIAGNOSIS — Z881 Allergy status to other antibiotic agents status: Secondary | ICD-10-CM

## 2018-02-22 DIAGNOSIS — Z90710 Acquired absence of both cervix and uterus: Secondary | ICD-10-CM

## 2018-02-22 DIAGNOSIS — K219 Gastro-esophageal reflux disease without esophagitis: Secondary | ICD-10-CM | POA: Diagnosis not present

## 2018-02-22 DIAGNOSIS — Z888 Allergy status to other drugs, medicaments and biological substances status: Secondary | ICD-10-CM

## 2018-02-22 DIAGNOSIS — Z79899 Other long term (current) drug therapy: Secondary | ICD-10-CM

## 2018-02-22 DIAGNOSIS — M1990 Unspecified osteoarthritis, unspecified site: Secondary | ICD-10-CM | POA: Diagnosis not present

## 2018-02-22 DIAGNOSIS — R7989 Other specified abnormal findings of blood chemistry: Secondary | ICD-10-CM

## 2018-02-22 DIAGNOSIS — Z91041 Radiographic dye allergy status: Secondary | ICD-10-CM

## 2018-02-22 DIAGNOSIS — I214 Non-ST elevation (NSTEMI) myocardial infarction: Secondary | ICD-10-CM | POA: Diagnosis not present

## 2018-02-22 DIAGNOSIS — F419 Anxiety disorder, unspecified: Secondary | ICD-10-CM | POA: Diagnosis present

## 2018-02-22 DIAGNOSIS — Z7982 Long term (current) use of aspirin: Secondary | ICD-10-CM

## 2018-02-22 DIAGNOSIS — Z8249 Family history of ischemic heart disease and other diseases of the circulatory system: Secondary | ICD-10-CM | POA: Diagnosis not present

## 2018-02-22 DIAGNOSIS — Z23 Encounter for immunization: Secondary | ICD-10-CM

## 2018-02-22 DIAGNOSIS — R0789 Other chest pain: Secondary | ICD-10-CM | POA: Diagnosis present

## 2018-02-22 DIAGNOSIS — E785 Hyperlipidemia, unspecified: Secondary | ICD-10-CM | POA: Diagnosis not present

## 2018-02-22 DIAGNOSIS — E876 Hypokalemia: Secondary | ICD-10-CM | POA: Diagnosis present

## 2018-02-22 DIAGNOSIS — Z833 Family history of diabetes mellitus: Secondary | ICD-10-CM | POA: Diagnosis not present

## 2018-02-22 DIAGNOSIS — Z7984 Long term (current) use of oral hypoglycemic drugs: Secondary | ICD-10-CM

## 2018-02-22 DIAGNOSIS — Z91013 Allergy to seafood: Secondary | ICD-10-CM

## 2018-02-22 LAB — TROPONIN I
TROPONIN I: 0.57 ng/mL — AB (ref <0.03)
TROPONIN I: 0.6 ng/mL — AB (ref <0.03)

## 2018-02-22 LAB — CBC
HCT: 36.8 % (ref 36.0–46.0)
HEMOGLOBIN: 13.3 g/dL (ref 12.0–15.0)
MCH: 30.5 pg (ref 26.0–34.0)
MCHC: 36.1 g/dL — ABNORMAL HIGH (ref 30.0–36.0)
MCV: 84.4 fL (ref 80.0–100.0)
Platelets: 350 10*3/uL (ref 150–400)
RBC: 4.36 MIL/uL (ref 3.87–5.11)
RDW: 14.1 % (ref 11.5–15.5)
WBC: 5.3 10*3/uL (ref 4.0–10.5)
nRBC: 0 % (ref 0.0–0.2)

## 2018-02-22 LAB — BASIC METABOLIC PANEL
ANION GAP: 13 (ref 5–15)
BUN: 15 mg/dL (ref 8–23)
CO2: 31 mmol/L (ref 22–32)
Calcium: 9.6 mg/dL (ref 8.9–10.3)
Chloride: 95 mmol/L — ABNORMAL LOW (ref 98–111)
Creatinine, Ser: 0.97 mg/dL (ref 0.44–1.00)
GFR calc Af Amer: >60 mL/min (ref >60)
GFR, EST NON AFRICAN AMERICAN: 57 mL/min — AB (ref >60)
GLUCOSE: 140 mg/dL — AB (ref 70–99)
POTASSIUM: 2.7 mmol/L — AB (ref 3.5–5.1)
Sodium: 139 mmol/L (ref 135–145)

## 2018-02-22 LAB — APTT: APTT: 30 s (ref 24–36)

## 2018-02-22 LAB — PROTIME-INR
INR: 0.9
Prothrombin Time: 12.1 seconds (ref 11.4–15.2)

## 2018-02-22 MED ORDER — NITROGLYCERIN 0.4 MG SL SUBL
0.4000 mg | SUBLINGUAL_TABLET | SUBLINGUAL | Status: DC | PRN
  Administered 2018-02-23: 0.4 mg via SUBLINGUAL
  Filled 2018-02-22: qty 1

## 2018-02-22 MED ORDER — ALPRAZOLAM 0.5 MG PO TABS
0.2500 mg | ORAL_TABLET | Freq: Every day | ORAL | Status: DC
  Administered 2018-02-22 – 2018-02-24 (×3): 0.25 mg via ORAL
  Filled 2018-02-22 (×3): qty 1

## 2018-02-22 MED ORDER — ASPIRIN EC 81 MG PO TBEC
81.0000 mg | DELAYED_RELEASE_TABLET | Freq: Every day | ORAL | Status: DC
  Administered 2018-02-23 – 2018-02-24 (×2): 81 mg via ORAL
  Filled 2018-02-22 (×3): qty 1

## 2018-02-22 MED ORDER — HEPARIN BOLUS VIA INFUSION
4000.0000 [IU] | Freq: Once | INTRAVENOUS | Status: AC
  Administered 2018-02-22: 4000 [IU] via INTRAVENOUS
  Filled 2018-02-22: qty 4000

## 2018-02-22 MED ORDER — HYDROXYZINE HCL 10 MG PO TABS
10.0000 mg | ORAL_TABLET | Freq: Every day | ORAL | Status: DC
  Administered 2018-02-23 – 2018-02-25 (×2): 10 mg via ORAL
  Filled 2018-02-22 (×3): qty 1

## 2018-02-22 MED ORDER — BISACODYL 5 MG PO TBEC: 5.0000 mg | DELAYED_RELEASE_TABLET | Freq: Every day | ORAL | Status: DC | PRN

## 2018-02-22 MED ORDER — HEPARIN (PORCINE) 25000 UT/250ML-% IV SOLN
950.0000 [IU]/h | INTRAVENOUS | Status: DC
  Administered 2018-02-22: 800 [IU]/h via INTRAVENOUS
  Administered 2018-02-23: 950 [IU]/h via INTRAVENOUS
  Filled 2018-02-22 (×2): qty 250

## 2018-02-22 MED ORDER — HYDROCHLOROTHIAZIDE 25 MG PO TABS
25.0000 mg | ORAL_TABLET | Freq: Every day | ORAL | Status: DC
  Administered 2018-02-23 – 2018-02-25 (×2): 25 mg via ORAL
  Filled 2018-02-22 (×2): qty 1

## 2018-02-22 MED ORDER — ACETAMINOPHEN 500 MG PO TABS
1000.0000 mg | ORAL_TABLET | Freq: Once | ORAL | Status: AC
  Administered 2018-02-22: 1000 mg via ORAL
  Filled 2018-02-22: qty 2

## 2018-02-22 MED ORDER — NITROGLYCERIN 2 % TD OINT
1.0000 [in_us] | TOPICAL_OINTMENT | Freq: Once | TRANSDERMAL | Status: AC
  Administered 2018-02-22: 1 [in_us] via TOPICAL
  Filled 2018-02-22: qty 1

## 2018-02-22 MED ORDER — DOCUSATE SODIUM 100 MG PO CAPS: 100.0000 mg | ORAL_CAPSULE | Freq: Two times a day (BID) | ORAL | Status: DC | PRN

## 2018-02-22 MED ORDER — AMLODIPINE BESYLATE 5 MG PO TABS
5.0000 mg | ORAL_TABLET | Freq: Two times a day (BID) | ORAL | Status: DC
  Administered 2018-02-22 – 2018-02-25 (×5): 5 mg via ORAL
  Filled 2018-02-22 (×6): qty 1

## 2018-02-22 MED ORDER — POTASSIUM CHLORIDE CRYS ER 20 MEQ PO TBCR
40.0000 meq | EXTENDED_RELEASE_TABLET | Freq: Once | ORAL | Status: AC
  Administered 2018-02-22: 40 meq via ORAL
  Filled 2018-02-22: qty 2

## 2018-02-22 MED ORDER — HEPARIN SODIUM (PORCINE) 5000 UNIT/ML IJ SOLN: 5000.0000 [IU] | Freq: Three times a day (TID) | INTRAMUSCULAR | Status: DC

## 2018-02-22 MED ORDER — INFLUENZA VAC SPLIT HIGH-DOSE 0.5 ML IM SUSY
0.5000 mL | PREFILLED_SYRINGE | INTRAMUSCULAR | Status: AC
  Administered 2018-02-25: 0.5 mL via INTRAMUSCULAR
  Filled 2018-02-22 (×2): qty 0.5

## 2018-02-22 MED ORDER — PANTOPRAZOLE SODIUM 40 MG PO TBEC
40.0000 mg | DELAYED_RELEASE_TABLET | Freq: Every day | ORAL | Status: DC
  Administered 2018-02-23 – 2018-02-25 (×2): 40 mg via ORAL
  Filled 2018-02-22 (×2): qty 1

## 2018-02-22 MED ORDER — ASPIRIN 81 MG PO CHEW
324.0000 mg | CHEWABLE_TABLET | Freq: Once | ORAL | Status: AC
  Administered 2018-02-22: 324 mg via ORAL
  Filled 2018-02-22: qty 4

## 2018-02-22 MED ORDER — TOFACITINIB CITRATE 5 MG PO TABS
5.0000 mg | ORAL_TABLET | Freq: Two times a day (BID) | ORAL | Status: DC
  Administered 2018-02-23 – 2018-02-24 (×3): 5 mg via ORAL
  Filled 2018-02-22 (×2): qty 1

## 2018-02-22 MED ORDER — INSULIN ASPART 100 UNIT/ML ~~LOC~~ SOLN
0.0000 [IU] | Freq: Three times a day (TID) | SUBCUTANEOUS | Status: DC
  Administered 2018-02-23: 1 [IU] via SUBCUTANEOUS
  Administered 2018-02-23: 3 [IU] via SUBCUTANEOUS
  Administered 2018-02-24: 5 [IU] via SUBCUTANEOUS
  Administered 2018-02-25: 1 [IU] via SUBCUTANEOUS
  Filled 2018-02-22 (×4): qty 1

## 2018-02-22 MED ORDER — IBUPROFEN 400 MG PO TABS
600.0000 mg | ORAL_TABLET | Freq: Two times a day (BID) | ORAL | Status: DC | PRN
  Administered 2018-02-23: 600 mg via ORAL
  Filled 2018-02-22: qty 2

## 2018-02-22 NOTE — ED Provider Notes (Signed)
[facility] Emergency Department Provider Note    First MD Initiated Contact with Patient 02/22/18 1916     (approximate)  I have reviewed the triage vital signs and the nursing notes.   HISTORY  Chief Complaint Chest Pain    HPI Elaine Cox is a 72 y.o. female   with a history of CAD with one stent presents the ER with several days of intermittent but progressively worsening nonexertional chest pressure and pain associated with some nausea.  Has not had any diaphoresis with this which she had with her previous MI requiring stent.  Denies any fevers.  No cough.  Does endorse some shortness of breath.  No pain with taking deep inspiration.  Pain not worsened with movement.  Tried nitroglycerin without any significant improvement or change in her symptoms.  Denies any vomiting or diarrhea.  Does take nightly aspirin.   Past Medical History:  Diagnosis Date  . Anginal pain ([place]) 06/2016   being followed by dr. Ubaldo Glassing  . Anxiety   . Arthritis    Osteoarthritis  . Arthritis    Rheumatoid  . Breast cancer ([place]) 2000   Right Breast - Chemotherapy  . 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  . Coronary artery disease   . Diabetes mellitus without complication [facility])    Patient takes Metformin  . Dyspnea 03/2017  . GERD (gastroesophageal reflux disease)   . History of abdominal hysterectomy   . History of eyelid surgery   . History of kidney stones   . Hyperlipidemia   . Hypertension   . Lumbago   . Myocardial infarction ([place]) 07/2010  . Orthopnea 03/2017  . Personal history of chemotherapy 2000   right breast ca  . Sinus disorder   . Stroke [facility]) 2006, 2012  . Wears dentures    full upper and lower   Family History  Problem Relation Age of Onset  . Diabetes type II Mother   . Hypertension Mother   . Colon polyps Mother   . Breast cancer Maternal Aunt    Past Surgical History:  Procedure Laterality Date  . ABDOMINAL  HYSTERECTOMY    . BREAST SURGERY Right    mastectomy   . CARDIAC CATHETERIZATION  2012   stent placed  . CATARACT EXTRACTION W/PHACO Right 06/24/2017   Procedure: CATARACT EXTRACTION PHACO AND INTRAOCULAR LENS PLACEMENT ([place]) COMPLICATED RIGHT DIABETIC;  Surgeon: Leandrew Koyanagi, MD;  Location: [place];  Service: Ophthalmology;  Laterality: Right;  Diabetes - oral med  . COLONOSCOPY    . COLONOSCOPY WITH PROPOFOL N/A 12/08/2017   Procedure: COLONOSCOPY WITH PROPOFOL;  Surgeon: Toledo, Benay Pike, MD;  Location: [facility] ENDOSCOPY;  Service: Gastroenterology;  Laterality: N/A;  . CORONARY ANGIOPLASTY    . CYSTOSCOPY N/A 09/08/2016   Procedure: CYSTOSCOPY;  Surgeon: Ward, Honor Loh, MD;  Location: [facility] ORS;  Service: Gynecology;  Laterality: N/A;  . EYE SURGERY Left    Cataract Extraction with IOL  . KNEE ARTHROSCOPY W/ MENISCAL REPAIR Right   . LAPAROSCOPIC BILATERAL SALPINGO OOPHERECTOMY Bilateral 09/08/2016   Procedure: LAPAROSCOPIC BILATERAL SALPINGO OOPHORECTOMY;  Surgeon: Ward, Honor Loh, MD;  Location: [facility] ORS;  Service: Gynecology;  Laterality: Bilateral;  . MASTECTOMY Right 2000   BREAST CA   There are no active problems to display for this patient.     Prior to Admission medications   Medication Sig Start Date End Date Taking? Authorizing Provider  ALPRAZolam Duanne Moron) 0.25 MG tablet Take  0.5 mg by mouth at bedtime.     [provider]  amLODipine (NORVASC) 5 MG tablet Take 5 mg by mouth 2 (two) times daily.     [provider]  aspirin EC 81 MG tablet Take 81 mg by mouth daily.    [provider]  BIOTIN PO Take 1 tablet by mouth daily.    [provider]  bisacodyl (DULCOLAX) 5 MG EC tablet Take 5 mg by mouth daily as needed for moderate constipation.    [provider]  diphenhydrAMINE (BENADRYL) 25 MG tablet Take 25 mg by mouth daily as needed for allergies.    [provider]  fluconazole (DIFLUCAN) 150 MG tablet  Take 150 mg by mouth daily.    [provider]  hydrochlorothiazide (HYDRODIURIL) 25 MG tablet Take 25 mg by mouth once daily 04/11/16   [provider]  hydrOXYzine (ATARAX/VISTARIL) 10 MG tablet Take 10 mg by mouth daily.     [provider]  ibuprofen (ADVIL,MOTRIN) 200 MG tablet Take 600 mg by mouth 2 (two) times daily as needed for moderate pain.    [provider]  Menthol-Methyl Salicylate (MUSCLE RUB) 10-15 % CREA Apply 1 application topically as needed for muscle pain.    [provider]  metFORMIN (GLUCOPHAGE) 500 MG tablet Take 500 mg by mouth 2 (two) times daily with a meal.    [provider]  metoprolol succinate (TOPROL-XL) 100 MG 24 hr tablet Take 50 mg by mouth every evening. Take with or immediately following a meal.     [provider]  nitroGLYCERIN (NITROSTAT) 0.4 MG SL tablet Place 0.4 mg under the tongue every 5 (five) minutes as needed for chest pain.     [provider]  nystatin (MYCOSTATIN) 100000 UNIT/ML suspension Take 5 mLs by mouth 4 (four) times daily.    [provider]  omeprazole (PRILOSEC) 20 MG capsule Take 20 mg by mouth daily.    [provider]  Polyvinyl Alcohol-Povidone (MURINE TEARS FOR DRY EYES OP) Apply 1 drop to eye daily as needed (dry eyes).    [provider]  potassium chloride (K-DUR,KLOR-CON) 10 MEQ tablet Take 10 mEq by mouth daily.     [provider]  tiZANidine (ZANAFLEX) 2 MG tablet Take 2 mg by mouth 3 (three) times daily as needed for muscle spasms.    [provider]  Tofacitinib Citrate (XELJANZ) 5 MG TABS Take 5 mg by mouth at bedtime.     [provider]    Allergies Doxazosin; Fish allergy; Dicloxacillin; Iodine; Sulfa antibiotics; Bactrim [sulfamethoxazole-trimethoprim]; Beta adrenergic blockers; Cardura [doxazosin mesylate]; Etodolac; Azithromycin; Clopidogrel; Colchicine; Glucosamine; Leflunomide; Meloxicam;  Methotrexate; and Zoloft [sertraline]    Social History Social History   Tobacco Use  . Smoking status: Never Smoker  . Smokeless tobacco: Never Used  Substance Use Topics  . Alcohol use: No  . Drug use: No    Review of Systems Patient denies headaches, rhinorrhea, blurry vision, numbness, shortness of breath, chest pain, edema, cough, abdominal pain, nausea, vomiting, diarrhea, dysuria, fevers, rashes or hallucinations unless otherwise stated above in HPI. ____________________________________________   PHYSICAL EXAM:  VITAL SIGNS: Vitals:   02/22/18 1921 02/22/18 1936  BP: (!) 154/70 (!) 144/77  Pulse: 67 81  Resp: 14 16  Temp:    SpO2: 95% 99%    Constitutional: Alert and oriented.  Eyes: Conjunctivae are normal.  Head: Atraumatic. Nose: No congestion/rhinnorhea. Mouth/Throat: Mucous membranes are moist.  Neck: No stridor. Painless ROM.  Cardiovascular: Normal rate, regular rhythm. Grossly normal heart sounds.  Good peripheral circulation. Respiratory: Normal respiratory effort.  No retractions. Lungs CTAB. Gastrointestinal: Soft and nontender. No distention. No abdominal bruits. No CVA tenderness. Genitourinary:  Musculoskeletal: No lower extremity tenderness nor edema.  No joint effusions. Neurologic:  Normal speech and language. No gross focal neurologic deficits are appreciated. No facial droop Skin:  Skin is warm, dry and intact. No rash noted. Psychiatric: Mood and affect are normal. Speech and behavior are normal.  ____________________________________________   LABS (all labs ordered are listed, but only abnormal results are displayed)  Results for orders placed or performed during the hospital encounter of 02/22/18 (from the past 24 hour(s))  Basic metabolic panel     Status: Abnormal   Collection Time: 02/22/18  6:21 PM  Result Value Ref Range   Sodium 139 135 - 145 mmol/L   Potassium 2.7 (LL) 3.5 - 5.1 mmol/L   Chloride 95 (L) 98 - 111 mmol/L    CO2 31 22 - 32 mmol/L   Glucose, Bld 140 (H) 70 - 99 mg/dL   BUN 15 8 - 23 mg/dL   Creatinine, Ser 0.97 0.44 - 1.00 mg/dL   Calcium 9.6 8.9 - 10.3 mg/dL   GFR calc non Af Amer 57 (L) >60 mL/min   GFR calc Af Amer >60 >60 mL/min   Anion gap 13 5 - 15  CBC     Status: Abnormal   Collection Time: 02/22/18  6:21 PM  Result Value Ref Range   WBC 5.3 4.0 - 10.5 K/uL   RBC 4.36 3.87 - 5.11 MIL/uL   Hemoglobin 13.3 12.0 - 15.0 g/dL   HCT 36.8 36.0 - 46.0 %   MCV 84.4 80.0 - 100.0 fL   MCH 30.5 26.0 - 34.0 pg   MCHC 36.1 (H) 30.0 - 36.0 g/dL   RDW 14.1 11.5 - 15.5 %   Platelets 350 150 - 400 K/uL   nRBC 0.0 0.0 - 0.2 %  Troponin I - ONCE - STAT     Status: Abnormal   Collection Time: 02/22/18  6:21 PM  Result Value Ref Range   Troponin I 0.57 (HH) <0.03 ng/mL   ____________________________________________  EKG My review and personal interpretation at Time: 18:13   Indication: chest pain  Rate: 70  Rhythm: sinus Axis: normal Other: normal intervals, no stemi, anterior t wave inversions consistent with previous tracing ____________________________________________  RADIOLOGY  I personally reviewed all radiographic images ordered to evaluate for the above acute complaints and reviewed radiology reports and findings.  These findings were personally discussed with the patient.  Please see medical record for radiology report.  ____________________________________________   PROCEDURES  Procedure(s) performed:  Procedures    Critical Care performed: yes ____________________________________________   INITIAL IMPRESSION / ASSESSMENT AND PLAN / ED COURSE  Pertinent labs & imaging results that were available during my care of the patient were reviewed by me and considered in my medical decision making (see chart for details).   DDX: ACS, pericarditis, esophagitis, boerhaaves, pe, dissection, pna, bronchitis, costochondritis   MACKENIZE DELGADILLO is a 72 y.o. who presents to the ED  with symptoms as described above.  Patient does have risk factors and known CAD presenting with several features typical of unstable angina.  Does have some chest pressure right now.  EKG does not show STEMI patient does have troponin elevation of 0.5.  Certainly concerning for ACS therefore will give aspirin  as well as heparin.  Not consistent with dissection or PE.  No evidence of congestive heart failure though family and patient reports significant amount of stress recently.  Possible stress cardiomyopathy.  Regardless patient will require admission for hemodynamic monitoring and cardiology consultation for further medical management.  Have discussed with the patient and available family all diagnostics and treatments performed thus far and all questions were answered to the best of my ability. The patient demonstrates understanding and agreement with plan.       As part of my medical decision making, I reviewed the following data within the [place] notes reviewed and incorporated, Labs reviewed, notes from prior ED visits.   ____________________________________________   FINAL CLINICAL IMPRESSION(S) / ED DIAGNOSES  Final diagnoses:  Chest pain, unspecified type      NEW MEDICATIONS STARTED DURING THIS VISIT:  New Prescriptions   No medications on file     Note:  This document was prepared using Dragon voice recognition software and may include unintentional dictation errors.    Merlyn Lot, MD 02/22/18 1945

## 2018-02-22 NOTE — ED Notes (Signed)
Devan RN, aware of bed assigned

## 2018-02-22 NOTE — Consult Note (Signed)
ANTICOAGULATION CONSULT NOTE - Initial Consult  Pharmacy Consult for heparin infusion Indication: chest pain/ACS  Allergies  Allergen Reactions  . Doxazosin Other (See Comments)    "headache"  . Fish Allergy Anaphylaxis, Itching, Nausea Only and Swelling    She states her tongue turns black.  . Dicloxacillin Nausea And Vomiting    Has patient had a PCN reaction causing immediate rash, facial/tongue/throat swelling, SOB or lightheadedness with hypotension: No Has patient had a PCN reaction causing severe rash involving mucus membranes or skin necrosis: No Has patient had a PCN reaction that required hospitalization: Unknown Has patient had a PCN reaction occurring within the last 10 years: Unknown If all of the above answers are "NO", then may proceed with Cephalosporin use.   . Iodine Hives    Per patient, she had a previous reaction/hives from an iodine injection  . Sulfa Antibiotics Swelling    Loss of appetite   Constipation bloating  . Bactrim [Sulfamethoxazole-Trimethoprim] Nausea Only  . Beta Adrenergic Blockers Other (See Comments)    "Unknown"  . Cardura [Doxazosin Mesylate]     Headache   . Etodolac Itching and Swelling  . Azithromycin Itching  . Clopidogrel Nausea And Vomiting  . Colchicine Nausea Only and Other (See Comments)    Dizzness  . Glucosamine Nausea And Vomiting  . Leflunomide Diarrhea and Itching  . Meloxicam Itching  . Methotrexate Nausea Only  . Zoloft [Sertraline] Nausea And Vomiting    Patient Measurements: Height: 5\' 2"  (157.5 cm) Weight: 178 lb (80.7 kg) IBW/kg (Calculated) : 50.1 Heparin Dosing Weight: 68.1  Vital Signs: Temp: 98.1 F (36.7 C) (11/18 1808) Temp Source: Oral (11/18 1808) BP: 144/77 (11/18 1936) Pulse Rate: 81 (11/18 1936)  Labs: Recent Labs    02/22/18 1821  HGB 13.3  HCT 36.8  PLT 350  CREATININE 0.97  TROPONINI 0.57*    Estimated Creatinine Clearance: 52.3 mL/min (by C-G formula based on SCr of 0.97  mg/dL).   Medical History: Past Medical History:  Diagnosis Date  . Anginal pain ([place]) 06/2016   being followed by dr. Ubaldo Glassing  . Anxiety   . Arthritis    Osteoarthritis  . Arthritis    Rheumatoid  . Breast cancer ([place]) 2000   Right Breast - Chemotherapy  . 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  . Coronary artery disease   . Diabetes mellitus without complication [facility])    Patient takes Metformin  . Dyspnea 03/2017  . GERD (gastroesophageal reflux disease)   . History of abdominal hysterectomy   . History of eyelid surgery   . History of kidney stones   . Hyperlipidemia   . Hypertension   . Lumbago   . Myocardial infarction ([place]) 07/2010  . Orthopnea 03/2017  . Personal history of chemotherapy 2000   right breast ca  . Sinus disorder   . Stroke [facility]) 2006, 2012  . Wears dentures    full upper and lower    Medications:   (Not in a hospital admission)  Assessment: 72 y.o. female  with a history of CAD with one stent presents the ER with several days of intermittent but progressively worsening nonexertional chest pressure and pain associated with some nausea.  Per chart review no listed anticoagulation PTA.  Goal of Therapy:  Heparin level 0.3-0.7 units/ml Monitor platelets by anticoagulation protocol: Yes   Plan:  Give 4000 units bolus x 1 Start heparin infusion at 800 units/hr Check heparin level every 8 hours until two  consecutive therapeutic level and then daily and CBC daily while on heparin. Continue to monitor H&H and platelets  Forrest Moron, PharmD Clinical Pharmacist 02/22/2018,7:48 PM

## 2018-02-22 NOTE — H&P (Signed)
[facility] [place] NAME: Elaine Cox    MR#:  [id number]  DATE OF BIRTH:  1945/06/13  DATE OF ADMISSION:  02/22/2018  PRIMARY CARE PHYSICIAN: Tracie Harrier, MD   REQUESTING/REFERRING PHYSICIAN: Quentin Cornwall  CHIEF COMPLAINT:   Chief Complaint  Cox presents with  . Chest Pain    HISTORY OF PRESENT ILLNESS: Elaine Cox  is a 72 y.o. female with a known history of angina, arthritis, breast cancer, collagen vascular disease, coronary artery disease status post stents, diabetes, hyperlipidemia, hypertension, stroke-having intermittent pressure-like central chest pain for last 3 days with exertion which is resolving by using nitroglycerin tablets. Concerned with this she came to emergency room today.  Noted to have elevated troponin, started on heparin IV drip and called hospitalist service to admit Elaine Cox.  PAST MEDICAL HISTORY:   Past Medical History:  Diagnosis Date  . Anginal pain ([place]) 06/2016   being followed by dr. Ubaldo Glassing  . Anxiety   . Arthritis    Osteoarthritis  . Arthritis    Rheumatoid  . Breast cancer ([place]) 2000   Right Breast - Chemotherapy  . 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  . Coronary artery disease   . Diabetes mellitus without complication [facility])    Cox takes Metformin  . Dyspnea 03/2017  . GERD (gastroesophageal reflux disease)   . History of abdominal hysterectomy   . History of eyelid surgery   . History of kidney stones   . Hyperlipidemia   . Hypertension   . Lumbago   . Myocardial infarction ([place]) 07/2010  . Orthopnea 03/2017  . Personal history of chemotherapy 2000   right breast ca  . Sinus disorder   . Stroke [facility]) 2006, 2012  . Wears dentures    full upper and lower    PAST SURGICAL HISTORY:  Past Surgical History:  Procedure Laterality Date  . ABDOMINAL HYSTERECTOMY    . BREAST SURGERY Right    mastectomy   . CARDIAC CATHETERIZATION  2012   stent  placed  . CATARACT EXTRACTION W/PHACO Right 06/24/2017   Procedure: CATARACT EXTRACTION PHACO AND INTRAOCULAR LENS PLACEMENT ([place]) COMPLICATED RIGHT DIABETIC;  Surgeon: Leandrew Koyanagi, MD;  Location: [place];  Service: Ophthalmology;  Laterality: Right;  Diabetes - oral med  . COLONOSCOPY    . COLONOSCOPY WITH PROPOFOL N/A 12/08/2017   Procedure: COLONOSCOPY WITH PROPOFOL;  Surgeon: Toledo, Benay Pike, MD;  Location: [facility] ENDOSCOPY;  Service: Gastroenterology;  Laterality: N/A;  . CORONARY ANGIOPLASTY    . CYSTOSCOPY N/A 09/08/2016   Procedure: CYSTOSCOPY;  Surgeon: Ward, Honor Loh, MD;  Location: [facility] ORS;  Service: Gynecology;  Laterality: N/A;  . EYE SURGERY Left    Cataract Extraction with IOL  . KNEE ARTHROSCOPY W/ MENISCAL REPAIR Right   . LAPAROSCOPIC BILATERAL SALPINGO OOPHERECTOMY Bilateral 09/08/2016   Procedure: LAPAROSCOPIC BILATERAL SALPINGO OOPHORECTOMY;  Surgeon: Ward, Honor Loh, MD;  Location: [facility] ORS;  Service: Gynecology;  Laterality: Bilateral;  . MASTECTOMY Right 2000   BREAST CA    SOCIAL HISTORY:  Social History   Tobacco Use  . Smoking status: Never Smoker  . Smokeless tobacco: Never Used  Substance Use Topics  . Alcohol use: No    FAMILY HISTORY:  Family History  Problem Relation Age of Onset  . Diabetes type II Mother   . Hypertension Mother   . Colon polyps Mother   . Breast cancer Maternal Aunt     DRUG  ALLERGIES:  Allergies  Allergen Reactions  . Doxazosin Other (See Comments)    "headache"  . Fish Allergy Anaphylaxis, Itching, Nausea Only and Swelling    She states her tongue turns black.  . Dicloxacillin Nausea And Vomiting    Has Cox had a PCN reaction causing immediate rash, facial/tongue/throat swelling, SOB or lightheadedness with hypotension: No Has Cox had a PCN reaction causing severe rash involving mucus membranes or skin necrosis: No Has Cox had a PCN reaction that required hospitalization: Unknown Has  Cox had a PCN reaction occurring within Elaine last 10 years: Unknown If all of Elaine above answers are "NO", then may proceed with Cephalosporin use.   . Iodine Hives    Per Cox, she had a previous reaction/hives from an iodine injection  . Sulfa Antibiotics Swelling    Loss of appetite   Constipation bloating  . Bactrim [Sulfamethoxazole-Trimethoprim] Nausea Only  . Beta Adrenergic Blockers Other (See Comments)    "Unknown"  . Cardura [Doxazosin Mesylate]     Headache   . Etodolac Itching and Swelling  . Azithromycin Itching  . Clopidogrel Nausea And Vomiting  . Colchicine Nausea Only and Other (See Comments)    Dizzness  . Glucosamine Nausea And Vomiting  . Leflunomide Diarrhea and Itching  . Meloxicam Itching  . Methotrexate Nausea Only  . Zoloft [Sertraline] Nausea And Vomiting    REVIEW OF SYSTEMS:   CONSTITUTIONAL: No fever, fatigue or weakness.  EYES: No blurred or double vision.  EARS, NOSE, AND THROAT: No tinnitus or ear pain.  RESPIRATORY: No cough, shortness of breath, wheezing or hemoptysis.  CARDIOVASCULAR: Positive for chest pain, no orthopnea, edema.  GASTROINTESTINAL: No nausea, vomiting, diarrhea or abdominal pain.  GENITOURINARY: No dysuria, hematuria.  ENDOCRINE: No polyuria, nocturia,  HEMATOLOGY: No anemia, easy bruising or bleeding SKIN: No rash or lesion. MUSCULOSKELETAL: No joint pain or arthritis.   NEUROLOGIC: No tingling, numbness, weakness.  PSYCHIATRY: No anxiety or depression.   MEDICATIONS AT HOME:  Prior to Admission medications   Medication Sig Start Date End Date Taking? Authorizing Provider  ALPRAZolam (XANAX) 0.25 MG tablet Take 0.25 mg by mouth at bedtime.    Yes [provider]  amLODipine (NORVASC) 5 MG tablet Take 5 mg by mouth 2 (two) times daily.    Yes [provider]  aspirin EC 81 MG tablet Take 81 mg by mouth daily.   Yes [provider]  BIOTIN PO Take 1 tablet by mouth daily.   Yes [provider]  bisacodyl (DULCOLAX) 5 MG EC tablet Take 5 mg by mouth daily as needed for moderate constipation.   Yes [provider]  diphenhydrAMINE (BENADRYL) 25 MG tablet Take 25 mg by mouth daily as needed for allergies.   Yes [provider]  hydrochlorothiazide (HYDRODIURIL) 25 MG tablet Take 25 mg by mouth daily.    Yes [provider]  hydrOXYzine (ATARAX/VISTARIL) 10 MG tablet Take 10 mg by mouth daily.    Yes [provider]  ibuprofen (ADVIL,MOTRIN) 200 MG tablet Take 600 mg by mouth 2 (two) times daily as needed for moderate pain.   Yes [provider]  Menthol-Methyl Salicylate (MUSCLE RUB) 10-15 % CREA Apply 1 application topically as needed for muscle pain.   Yes [provider]  metFORMIN (GLUCOPHAGE) 500 MG tablet Take 500 mg by mouth 2 (two) times daily with a meal.   Yes [provider]  metoprolol succinate (TOPROL-XL) 100 MG 24 hr tablet Take  100 mg by mouth every evening.    Yes [provider]  nitroGLYCERIN (NITROSTAT) 0.4 MG SL tablet Place 0.4 mg under Elaine tongue every 5 (five) minutes as needed for chest pain.    Yes [provider]  nystatin (MYCOSTATIN) 100000 UNIT/ML suspension Take 5 mLs by mouth 4 (four) times daily.   Yes [provider]  omeprazole (PRILOSEC) 20 MG capsule Take 20 mg by mouth daily.   Yes [provider]  Polyvinyl Alcohol-Povidone (MURINE TEARS FOR DRY EYES OP) Apply 1 drop to eye daily as needed (dry eyes).   Yes [provider]  potassium chloride (K-DUR,KLOR-CON) 10 MEQ tablet Take 10 mEq by mouth daily.    Yes [provider]  Tofacitinib Citrate (XELJANZ) 5 MG TABS Take 5 mg by mouth 2 (two) times daily.    Yes [provider]      PHYSICAL EXAMINATION:   VITAL SIGNS: Blood pressure 120/67, pulse 72, temperature 98.1 F (36.7 C), temperature source Oral, resp. rate 14, height 5\' 2"  (1.575 m), weight 80.7 kg, SpO2  98 %.  GENERAL:  73 y.o.-year-old Cox lying in Elaine bed with no acute distress.  EYES: Pupils equal, round, reactive to light and accommodation. No scleral icterus. Extraocular muscles intact.  HEENT: Head atraumatic, normocephalic. Oropharynx and nasopharynx clear.  NECK:  Supple, no jugular venous distention. No thyroid enlargement, no tenderness.  LUNGS: Normal breath sounds bilaterally, no wheezing, rales,rhonchi or crepitation. No use of accessory muscles of respiration.  CARDIOVASCULAR: S1, S2 normal. No murmurs, rubs, or gallops.  ABDOMEN: Soft, nontender, nondistended. Bowel sounds present. No organomegaly or mass.  EXTREMITIES: No pedal edema, cyanosis, or clubbing.  NEUROLOGIC: Cranial nerves II through XII are intact. Muscle strength 5/5 in all extremities. Sensation intact. Gait not checked.  PSYCHIATRIC: Elaine Cox is alert and oriented x 3.  SKIN: No obvious rash, lesion, or ulcer.   LABORATORY PANEL:   CBC Recent Labs  Lab 02/22/18 1821  WBC 5.3  HGB 13.3  HCT 36.8  PLT 350  MCV 84.4  MCH 30.5  MCHC 36.1*  RDW 14.1   ------------------------------------------------------------------------------------------------------------------  Chemistries  Recent Labs  Lab 02/22/18 1821  NA 139  K 2.7*  CL 95*  CO2 31  GLUCOSE 140*  BUN 15  CREATININE 0.97  CALCIUM 9.6   ------------------------------------------------------------------------------------------------------------------ estimated creatinine clearance is 52.3 mL/min (by C-G formula based on SCr of 0.97 mg/dL). ------------------------------------------------------------------------------------------------------------------ No results for input(s): TSH, T4TOTAL, T3FREE, THYROIDAB in Elaine last 72 hours.  Invalid input(s): FREET3   Coagulation profile Recent Labs  Lab 02/22/18 1821  INR 0.90    ------------------------------------------------------------------------------------------------------------------- No results for input(s): DDIMER in Elaine last 72 hours. -------------------------------------------------------------------------------------------------------------------  Cardiac Enzymes Recent Labs  Lab 02/22/18 1821  TROPONINI 0.57*   ------------------------------------------------------------------------------------------------------------------ Invalid input(s): POCBNP  ---------------------------------------------------------------------------------------------------------------  Urinalysis    Component Value Date/Time   COLORURINE YELLOW (A) 01/02/2015 1910   APPEARANCEUR CLEAR (A) 01/02/2015 1910   LABSPEC 1.058 (H) 01/02/2015 1910   PHURINE 6.0 01/02/2015 1910   GLUCOSEU >500 (A) 01/02/2015 1910   HGBUR NEGATIVE 01/02/2015 1910   BILIRUBINUR NEGATIVE 01/02/2015 1910   KETONESUR NEGATIVE 01/02/2015 1910   PROTEINUR NEGATIVE 01/02/2015 1910   NITRITE NEGATIVE 01/02/2015 1910   LEUKOCYTESUR NEGATIVE 01/02/2015 1910     RADIOLOGY: Dg Chest 2 View  Result Date: 02/22/2018 CLINICAL DATA:  Chest pain. EXAM: CHEST - 2 VIEW COMPARISON:  05/18/2012 FINDINGS: Elaine heart size and mediastinal contours are within normal limits. Both lungs  are clear. Elaine visualized skeletal structures are unremarkable. IMPRESSION: No active cardiopulmonary disease. Electronically Signed   By: Kerby Moors M.D.   On: 02/22/2018 18:50    EKG: Orders placed or performed during Elaine hospital encounter of 02/22/18  . EKG 12-Lead  . EKG 12-Lead  . ED EKG within 10 minutes  . ED EKG within 10 minutes    IMPRESSION AND PLAN:  *Non-ST elevation MI IV heparin drip, follow serial troponin. Cardiology consult and monitor on telemetry. Continue cardiac medications as home.  *Hypertension Continue amlodipine, hydrochlorothiazide, metoprolol  *Diabetes without complications Hold  metformin as Cox might need catheterization tomorrow.  Keep on sliding scale coverage.  *History of stroke without residual weakness Continue aspirin.  *Gastroesophageal reflux disease Continue PPI.  All Elaine records are reviewed and case discussed with ED provider. Management plans discussed with Elaine Cox, family and they are in agreement.  CODE STATUS: Full code.  Cox's daughter was present in Elaine room during my visit.  TOTAL TIME TAKING CARE OF THIS Cox: 45 minutes.    Vaughan Basta M.D on 02/22/2018   Between 7am to 6pm - Pager - [phone]  After 6pm go to [email or web address] - password [email or web address] [facility]  Office  [phone]  CC: Primary care physician; Tracie Harrier, MD   Note: This dictation was prepared with Dragon dictation along with smaller phrase technology. Any transcriptional errors that result from this process are unintentional.

## 2018-02-22 NOTE — ED Triage Notes (Signed)
Patient presents to the ED with intermittent chest pain since Friday.  Patient states she has also had intermittent headaches and nausea.  Patient states she took 2 nitros Friday, 1 Saturday, and 1 around 4:30pm.  Patient reports no chest pain relief.  Patient is in no obvious distress at this time.  Patient reports occasional shortness of breath.  Denies cough.  Patient has history of cardiac stent.

## 2018-02-22 NOTE — Progress Notes (Signed)
Family Meeting Note  Advance Directive:yes  Today a meeting took place with the Patient and daughter.   The following clinical team members were present during this meeting:MD  The following were discussed:Patient's diagnosis:NSTEMI , Patient's progosis: Unable to determine and Goals for treatment: Full Code  Additional follow-up to be provided: cardiology  Time spent during discussion:20 minutes  Vaughan Basta, MD

## 2018-02-22 NOTE — ED Notes (Signed)
Elaine Cox, daughter cell phone [phone], would like to be reached for bed updates.

## 2018-02-22 NOTE — Progress Notes (Signed)
Patient potassium was 2.7. Notified MD and received new orders.

## 2018-02-23 LAB — GLUCOSE, CAPILLARY
GLUCOSE-CAPILLARY: 158 mg/dL — AB (ref 70–99)
Glucose-Capillary: 107 mg/dL — ABNORMAL HIGH (ref 70–99)
Glucose-Capillary: 135 mg/dL — ABNORMAL HIGH (ref 70–99)
Glucose-Capillary: 195 mg/dL — ABNORMAL HIGH (ref 70–99)
Glucose-Capillary: 232 mg/dL — ABNORMAL HIGH (ref 70–99)

## 2018-02-23 LAB — HEPARIN LEVEL (UNFRACTIONATED)
HEPARIN UNFRACTIONATED: 0.43 [IU]/mL (ref 0.30–0.70)
HEPARIN UNFRACTIONATED: 0.21 [IU]/mL — AB (ref 0.30–0.70)
Heparin Unfractionated: 0.56 IU/mL (ref 0.30–0.70)

## 2018-02-23 LAB — BASIC METABOLIC PANEL
Anion gap: 7 (ref 5–15)
BUN: 11 mg/dL (ref 8–23)
CHLORIDE: 100 mmol/L (ref 98–111)
CO2: 30 mmol/L (ref 22–32)
CREATININE: 0.67 mg/dL (ref 0.44–1.00)
Calcium: 9 mg/dL (ref 8.9–10.3)
GFR calc Af Amer: >60 mL/min (ref >60)
GFR calc non Af Amer: >60 mL/min (ref >60)
Glucose, Bld: 137 mg/dL — ABNORMAL HIGH (ref 70–99)
POTASSIUM: 2.8 mmol/L — AB (ref 3.5–5.1)
Sodium: 137 mmol/L (ref 135–145)

## 2018-02-23 LAB — CBC
HCT: 33 % — ABNORMAL LOW (ref 36.0–46.0)
HEMOGLOBIN: 12 g/dL (ref 12.0–15.0)
MCH: 30.6 pg (ref 26.0–34.0)
MCHC: 36.4 g/dL — ABNORMAL HIGH (ref 30.0–36.0)
MCV: 84.2 fL (ref 80.0–100.0)
Platelets: 298 10*3/uL (ref 150–400)
RBC: 3.92 MIL/uL (ref 3.87–5.11)
RDW: 14 % (ref 11.5–15.5)
WBC: 5.6 10*3/uL (ref 4.0–10.5)
nRBC: 0 % (ref 0.0–0.2)

## 2018-02-23 LAB — MAGNESIUM: MAGNESIUM: 1.8 mg/dL (ref 1.7–2.4)

## 2018-02-23 LAB — HEMOGLOBIN A1C
Hgb A1c MFr Bld: 6.2 % — ABNORMAL HIGH (ref 4.8–5.6)
Mean Plasma Glucose: 131.24 mg/dL

## 2018-02-23 LAB — TROPONIN I
Troponin I: 0.57 ng/mL (ref <0.03)
Troponin I: 0.71 ng/mL (ref <0.03)

## 2018-02-23 MED ORDER — POTASSIUM CHLORIDE CRYS ER 20 MEQ PO TBCR
40.0000 meq | EXTENDED_RELEASE_TABLET | Freq: Two times a day (BID) | ORAL | Status: AC
  Administered 2018-02-23 (×2): 40 meq via ORAL
  Filled 2018-02-23 (×2): qty 2

## 2018-02-23 MED ORDER — ACETAMINOPHEN 325 MG PO TABS
650.0000 mg | ORAL_TABLET | ORAL | Status: DC | PRN
  Administered 2018-02-23: 650 mg via ORAL
  Filled 2018-02-23: qty 2

## 2018-02-23 MED ORDER — ASPIRIN 81 MG PO CHEW
81.0000 mg | CHEWABLE_TABLET | ORAL | Status: AC
  Administered 2018-02-24: 81 mg via ORAL
  Filled 2018-02-23: qty 1

## 2018-02-23 MED ORDER — HEPARIN BOLUS VIA INFUSION
1000.0000 [IU] | Freq: Once | INTRAVENOUS | Status: AC
  Administered 2018-02-23: 1000 [IU] via INTRAVENOUS
  Filled 2018-02-23: qty 1000

## 2018-02-23 MED ORDER — SODIUM CHLORIDE 0.9 % WEIGHT BASED INFUSION
3.0000 mL/kg/h | INTRAVENOUS | Status: AC
  Administered 2018-02-24: 3 mL/kg/h via INTRAVENOUS

## 2018-02-23 MED ORDER — SODIUM CHLORIDE 0.9 % WEIGHT BASED INFUSION
1.0000 mL/kg/h | INTRAVENOUS | Status: DC
  Administered 2018-02-24: 1 mL/kg/h via INTRAVENOUS

## 2018-02-23 MED ORDER — SODIUM CHLORIDE 0.9 % IV SOLN: 250.0000 mL | INTRAVENOUS | Status: DC | PRN

## 2018-02-23 MED ORDER — SODIUM CHLORIDE 0.9% FLUSH
3.0000 mL | Freq: Two times a day (BID) | INTRAVENOUS | Status: DC
  Administered 2018-02-23: 3 mL via INTRAVENOUS

## 2018-02-23 MED ORDER — LIP MEDEX EX OINT
TOPICAL_OINTMENT | CUTANEOUS | Status: DC | PRN
  Filled 2018-02-23: qty 7

## 2018-02-23 MED ORDER — METOPROLOL SUCCINATE ER 100 MG PO TB24
100.0000 mg | ORAL_TABLET | Freq: Every evening | ORAL | Status: DC
  Administered 2018-02-23 – 2018-02-24 (×2): 100 mg via ORAL
  Filled 2018-02-23 (×3): qty 1

## 2018-02-23 MED ORDER — POTASSIUM CHLORIDE CRYS ER 20 MEQ PO TBCR
40.0000 meq | EXTENDED_RELEASE_TABLET | Freq: Once | ORAL | Status: AC
  Administered 2018-02-23: 40 meq via ORAL
  Filled 2018-02-23: qty 2

## 2018-02-23 NOTE — Consult Note (Signed)
[place] consulted to replace electrolytes in the 72 year old female who presents with chest pain and elevated troponin K on admission was 2.7. She received 40 MEQ KCL last night. Only improved to 2.8 Mg checked WNL at 1.8 I will give 40 MEQ po x 3 doses Recheck in the AM  [employer], Pharm.D, BCPS Clinical Pharmacist

## 2018-02-23 NOTE — Progress Notes (Signed)
Patient admitted to the unit with no chest pain. Possible procedure 11/19. Completed admission. Potassium was 2.7. Gave replacement.

## 2018-02-23 NOTE — Plan of Care (Signed)
  Problem: Activity: Goal: Risk for activity intolerance will decrease Outcome: Progressing   Problem: Safety: Goal: Ability to remain free from injury will improve Outcome: Progressing   Problem: Skin Integrity: Goal: Risk for impaired skin integrity will decrease Outcome: Progressing   Problem: Activity: Goal: Ability to tolerate increased activity will improve Outcome: Progressing   Problem: Health Behavior/Discharge Planning: Goal: Ability to safely manage health-related needs after discharge will improve Outcome: Progressing

## 2018-02-23 NOTE — Consult Note (Signed)
ANTICOAGULATION CONSULT NOTE - Initial Consult  Pharmacy Consult for heparin infusion Indication: chest pain/ACS  Allergies  Allergen Reactions  . Doxazosin Other (See Comments)    "headache"  . Fish Allergy Anaphylaxis, Itching, Nausea Only and Swelling    She states her tongue turns black.  . Dicloxacillin Nausea And Vomiting    Has patient had a PCN reaction causing immediate rash, facial/tongue/throat swelling, SOB or lightheadedness with hypotension: No Has patient had a PCN reaction causing severe rash involving mucus membranes or skin necrosis: No Has patient had a PCN reaction that required hospitalization: Unknown Has patient had a PCN reaction occurring within the last 10 years: Unknown If all of the above answers are "NO", then may proceed with Cephalosporin use.   . Iodine Hives    Per patient, she had a previous reaction/hives from an iodine injection  . Sulfa Antibiotics Swelling    Loss of appetite   Constipation bloating  . Bactrim [Sulfamethoxazole-Trimethoprim] Nausea Only  . Beta Adrenergic Blockers Other (See Comments)    "Unknown"  . Cardura [Doxazosin Mesylate]     Headache   . Etodolac Itching and Swelling  . Azithromycin Itching  . Clopidogrel Nausea And Vomiting  . Colchicine Nausea Only and Other (See Comments)    Dizzness  . Glucosamine Nausea And Vomiting  . Leflunomide Diarrhea and Itching  . Meloxicam Itching  . Methotrexate Nausea Only  . Zoloft [Sertraline] Nausea And Vomiting    Patient Measurements: Height: 5\' 2"  (157.5 cm) Weight: 179 lb 6.4 oz (81.4 kg) IBW/kg (Calculated) : 50.1 Heparin Dosing Weight: 68.1  Vital Signs: Temp: 98.6 F (37 C) (11/19 0741) Temp Source: Oral (11/19 0741) BP: 117/66 (11/19 0741) Pulse Rate: 63 (11/19 0741)  Labs: Recent Labs    02/22/18 1821 02/22/18 2237 02/23/18 0340 02/23/18 0933 02/23/18 1229  HGB 13.3  --  12.0  --   --   HCT 36.8  --  33.0*  --   --   PLT 350  --  298  --   --   APTT  30  --   --   --   --   LABPROT 12.1  --   --   --   --   INR 0.90  --   --   --   --   HEPARINUNFRC  --   --  0.43  --  0.21*  CREATININE 0.97  --  0.67  --   --   TROPONINI 0.57* 0.60* 0.57* 0.71*  --     Estimated Creatinine Clearance: 63.7 mL/min (by C-G formula based on SCr of 0.67 mg/dL).   Medical History: Past Medical History:  Diagnosis Date  . Anginal pain ([place]) 06/2016   being followed by dr. Ubaldo Glassing  . Anxiety   . Arthritis    Osteoarthritis  . Arthritis    Rheumatoid  . Breast cancer ([place]) 2000   Right Breast - Chemotherapy  . 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  . Coronary artery disease   . Diabetes mellitus without complication [facility])    Patient takes Metformin  . Dyspnea 03/2017  . GERD (gastroesophageal reflux disease)   . History of abdominal hysterectomy   . History of eyelid surgery   . History of kidney stones   . Hyperlipidemia   . Hypertension   . Lumbago   . Myocardial infarction ([place]) 07/2010  . Orthopnea 03/2017  . Personal history of chemotherapy 2000   right breast ca  .  Sinus disorder   . Stroke [facility]) 2006, 2012  . Wears dentures    full upper and lower    Medications:  Medications Prior to Admission  Medication Sig Dispense Refill Last Dose  . ALPRAZolam (XANAX) 0.25 MG tablet Take 0.25 mg by mouth at bedtime.    02/21/2018 at pm  . amLODipine (NORVASC) 5 MG tablet Take 5 mg by mouth 2 (two) times daily.    02/22/2018 at am  . aspirin EC 81 MG tablet Take 81 mg by mouth daily.   02/22/2018 at am  . BIOTIN PO Take 1 tablet by mouth daily.   02/22/2018 at am  . bisacodyl (DULCOLAX) 5 MG EC tablet Take 5 mg by mouth daily as needed for moderate constipation.   unknown at unknown  . diphenhydrAMINE (BENADRYL) 25 MG tablet Take 25 mg by mouth daily as needed for allergies.   unknown at unknown  . hydrochlorothiazide (HYDRODIURIL) 25 MG tablet Take 25 mg by mouth daily.    02/22/2018 at am  . hydrOXYzine  (ATARAX/VISTARIL) 10 MG tablet Take 10 mg by mouth daily.    02/22/2018 at am  . ibuprofen (ADVIL,MOTRIN) 200 MG tablet Take 600 mg by mouth 2 (two) times daily as needed for moderate pain.   unknown at unknown  . Menthol-Methyl Salicylate (MUSCLE RUB) 10-15 % CREA Apply 1 application topically as needed for muscle pain.   unknown at unknown  . metFORMIN (GLUCOPHAGE) 500 MG tablet Take 500 mg by mouth 2 (two) times daily with a meal.   02/22/2018 at am  . metoprolol succinate (TOPROL-XL) 100 MG 24 hr tablet Take 100 mg by mouth every evening.    02/21/2018 at 1930  . nitroGLYCERIN (NITROSTAT) 0.4 MG SL tablet Place 0.4 mg under the tongue every 5 (five) minutes as needed for chest pain.    unknown at unknown  . nystatin (MYCOSTATIN) 100000 UNIT/ML suspension Take 5 mLs by mouth 4 (four) times daily.   02/22/2018 at am  . omeprazole (PRILOSEC) 20 MG capsule Take 20 mg by mouth daily.   02/22/2018 at am  . Polyvinyl Alcohol-Povidone (MURINE TEARS FOR DRY EYES OP) Apply 1 drop to eye daily as needed (dry eyes).   unknown at unknown  . potassium chloride (K-DUR,KLOR-CON) 10 MEQ tablet Take 10 mEq by mouth daily.    02/22/2018 at am  . Tofacitinib Citrate (XELJANZ) 5 MG TABS Take 5 mg by mouth 2 (two) times daily.    02/22/2018 at am    Assessment: 72 y.o. female  with a history of CAD with one stent presents the ER with several days of intermittent but progressively worsening nonexertional chest pressure and pain associated with some nausea.  Per chart review no listed anticoagulation PTA.  Goal of Therapy:  Heparin level 0.3-0.7 units/ml Monitor platelets by anticoagulation protocol: Yes   Plan:  Heparin level subtherapeutic at  0.21. Will bolus 1000 units and increase rate to 950 units/hr. Check another level in 6 hours   Ramond Dial, PharmD,BCPS Clinical Pharmacist 02/23/2018,1:08 PM

## 2018-02-23 NOTE — Consult Note (Signed)
[facility] Cardiology  CARDIOLOGY CONSULT NOTE  Patient ID: Elaine Cox MRN: [id number] DOB/AGE: 71-11-1945 72 y.o.  Admit date: 02/22/2018 Referring Physician [place] Primary Physician [facility] Primary Cardiologist Fath Reason for Consultation Chest pain  HPI: 72 year old female referred for evaluation of chest pain.  The patient has a history of coronary artery disease status post PCI in 2012, hyperlipidemia, hypertension, stroke, rheumatoid arthritis, and type 2 diabetes.  The patient presented to [facility] for a several day history of exertional chest pressure, which would resolve with nitroglycerin. Admission labs notable for troponin elevated at 0.57, 0.60, followed by 0.57, and potassium 2.7.  The patient was started on heparin drip.  ECG revealed sinus rhythm at a rate of 67 bpm with nonspecific ST and T wave abnormalities. The patient currently denies chest pain, but reports a dull chest ache and "soreness." She denies pleuritic chest pain or calf tenderness. 2D echocardiogram 04/01/2017 revealed normal left ventricular function, with LVEF greater than 55%, and moderate mitral and tricuspid insufficiency. The patient also reports a 2 month history of poor balance, forgetfulness, and dull headaches. She reports an approximate 1 year history of exertional shortness of breath induced by minimal exertion, which has progressively worsened recently. She denies peripheral edema, presyncope, or syncope.   Review of systems complete and found to be negative unless listed above     Past Medical History:  Diagnosis Date  . Anginal pain ([place]) 06/2016   being followed by dr. Ubaldo Glassing  . Anxiety   . Arthritis    Osteoarthritis  . Arthritis    Rheumatoid  . Breast cancer ([place]) 2000   Right Breast - Chemotherapy  . 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  . Coronary artery disease   . Diabetes mellitus without complication [facility])    Patient takes Metformin  . Dyspnea 03/2017  . GERD  (gastroesophageal reflux disease)   . History of abdominal hysterectomy   . History of eyelid surgery   . History of kidney stones   . Hyperlipidemia   . Hypertension   . Lumbago   . Myocardial infarction ([place]) 07/2010  . Orthopnea 03/2017  . Personal history of chemotherapy 2000   right breast ca  . Sinus disorder   . Stroke [facility]) 2006, 2012  . Wears dentures    full upper and lower    Past Surgical History:  Procedure Laterality Date  . ABDOMINAL HYSTERECTOMY    . BREAST SURGERY Right    mastectomy   . CARDIAC CATHETERIZATION  2012   stent placed  . CATARACT EXTRACTION W/PHACO Right 06/24/2017   Procedure: CATARACT EXTRACTION PHACO AND INTRAOCULAR LENS PLACEMENT ([place]) COMPLICATED RIGHT DIABETIC;  Surgeon: Leandrew Koyanagi, MD;  Location: [place];  Service: Ophthalmology;  Laterality: Right;  Diabetes - oral med  . COLONOSCOPY    . COLONOSCOPY WITH PROPOFOL N/A 12/08/2017   Procedure: COLONOSCOPY WITH PROPOFOL;  Surgeon: Toledo, Benay Pike, MD;  Location: [facility] ENDOSCOPY;  Service: Gastroenterology;  Laterality: N/A;  . CORONARY ANGIOPLASTY    . CYSTOSCOPY N/A 09/08/2016   Procedure: CYSTOSCOPY;  Surgeon: Ward, Honor Loh, MD;  Location: [facility] ORS;  Service: Gynecology;  Laterality: N/A;  . EYE SURGERY Left    Cataract Extraction with IOL  . KNEE ARTHROSCOPY W/ MENISCAL REPAIR Right   . LAPAROSCOPIC BILATERAL SALPINGO OOPHERECTOMY Bilateral 09/08/2016   Procedure: LAPAROSCOPIC BILATERAL SALPINGO OOPHORECTOMY;  Surgeon: Ward, Honor Loh, MD;  Location: [facility] ORS;  Service: Gynecology;  Laterality: Bilateral;  . MASTECTOMY Right  2000   BREAST CA    Medications Prior to Admission  Medication Sig Dispense Refill Last Dose  . ALPRAZolam (XANAX) 0.25 MG tablet Take 0.25 mg by mouth at bedtime.    02/21/2018 at pm  . amLODipine (NORVASC) 5 MG tablet Take 5 mg by mouth 2 (two) times daily.    02/22/2018 at am  . aspirin EC 81 MG tablet Take 81 mg by mouth daily.   02/22/2018 at  am  . BIOTIN PO Take 1 tablet by mouth daily.   02/22/2018 at am  . bisacodyl (DULCOLAX) 5 MG EC tablet Take 5 mg by mouth daily as needed for moderate constipation.   unknown at unknown  . diphenhydrAMINE (BENADRYL) 25 MG tablet Take 25 mg by mouth daily as needed for allergies.   unknown at unknown  . hydrochlorothiazide (HYDRODIURIL) 25 MG tablet Take 25 mg by mouth daily.    02/22/2018 at am  . hydrOXYzine (ATARAX/VISTARIL) 10 MG tablet Take 10 mg by mouth daily.    02/22/2018 at am  . ibuprofen (ADVIL,MOTRIN) 200 MG tablet Take 600 mg by mouth 2 (two) times daily as needed for moderate pain.   unknown at unknown  . Menthol-Methyl Salicylate (MUSCLE RUB) 10-15 % CREA Apply 1 application topically as needed for muscle pain.   unknown at unknown  . metFORMIN (GLUCOPHAGE) 500 MG tablet Take 500 mg by mouth 2 (two) times daily with a meal.   02/22/2018 at am  . metoprolol succinate (TOPROL-XL) 100 MG 24 hr tablet Take 100 mg by mouth every evening.    02/21/2018 at 1930  . nitroGLYCERIN (NITROSTAT) 0.4 MG SL tablet Place 0.4 mg under the tongue every 5 (five) minutes as needed for chest pain.    unknown at unknown  . nystatin (MYCOSTATIN) 100000 UNIT/ML suspension Take 5 mLs by mouth 4 (four) times daily.   02/22/2018 at am  . omeprazole (PRILOSEC) 20 MG capsule Take 20 mg by mouth daily.   02/22/2018 at am  . Polyvinyl Alcohol-Povidone (MURINE TEARS FOR DRY EYES OP) Apply 1 drop to eye daily as needed (dry eyes).   unknown at unknown  . potassium chloride (K-DUR,KLOR-CON) 10 MEQ tablet Take 10 mEq by mouth daily.    02/22/2018 at am  . Tofacitinib Citrate (XELJANZ) 5 MG TABS Take 5 mg by mouth 2 (two) times daily.    02/22/2018 at am   Social History   Socioeconomic History  . Marital status: Widowed    Spouse name: Not on file  . Number of children: Not on file  . Years of education: Not on file  . Highest education level: Not on file  Occupational History  . Not on file  Social Needs   . Financial resource strain: Not on file  . Food insecurity:    Worry: Not on file    Inability: Not on file  . Transportation needs:    Medical: Not on file    Non-medical: Not on file  Tobacco Use  . Smoking status: Never Smoker  . Smokeless tobacco: Never Used  Substance and Sexual Activity  . Alcohol use: No  . Drug use: No  . Sexual activity: Not on file  Lifestyle  . Physical activity:    Days per week: Not on file    Minutes per session: Not on file  . Stress: Not on file  Relationships  . Social connections:    Talks on phone: Not on file    Gets together: Not  on file    Attends religious service: Not on file    Active member of club or organization: Not on file    Attends meetings of clubs or organizations: Not on file    Relationship status: Not on file  . Intimate partner violence:    Fear of current or ex partner: Not on file    Emotionally abused: Not on file    Physically abused: Not on file    Forced sexual activity: Not on file  Other Topics Concern  . Not on file  Social History Narrative  . Not on file    Family History  Problem Relation Age of Onset  . Diabetes type II Mother   . Hypertension Mother   . Colon polyps Mother   . Breast cancer Maternal Aunt       Review of systems complete and found to be negative unless listed above      PHYSICAL EXAM  General: Well developed, well nourished, in no acute distress, lying at minimal incline in bed HEENT:  Normocephalic and atramatic Neck:  No JVD.  Lungs: Clear bilaterally to auscultation and percussion. Heart: HRRR . Normal S1 and S2 without gallops or murmurs.  Abdomen: nondistended Msk:  Back normal, gait not assessed. No obvious deformity Extremities: No clubbing, cyanosis or edema.   Neuro: Alert and oriented X 3. Psych:  Good affect, responds appropriately  Labs:   Lab Results  Component Value Date   WBC 5.6 02/23/2018   HGB 12.0 02/23/2018   HCT 33.0 (L) 02/23/2018   MCV  84.2 02/23/2018   PLT 298 02/23/2018    Recent Labs  Lab 02/23/18 0340  NA 137  K 2.8*  CL 100  CO2 30  BUN 11  CREATININE 0.67  CALCIUM 9.0  GLUCOSE 137*   Lab Results  Component Value Date   TROPONINI 0.57 ([place]) 02/23/2018   No results found for: CHOL No results found for: HDL No results found for: LDLCALC No results found for: TRIG No results found for: CHOLHDL No results found for: LDLDIRECT    Radiology: Dg Chest 2 View  Result Date: 02/22/2018 CLINICAL DATA:  Chest pain. EXAM: CHEST - 2 VIEW COMPARISON:  05/18/2012 FINDINGS: The heart size and mediastinal contours are within normal limits. Both lungs are clear. The visualized skeletal structures are unremarkable. IMPRESSION: No active cardiopulmonary disease. Electronically Signed   By: Kerby Moors M.D.   On: 02/22/2018 18:50    EKG: Sinus rhythm  ASSESSMENT AND PLAN:  1. Chest pain with known coronary artery disease, status post PCI in 2012, with elevated troponin of 0.57, 0.60, followed by 0.57, with nonspecific ECG changes, currently on heparin drip. 2. Coronary artery disease, status post PCI in 2012 3. Hypertension 4. Type II diabetes 5. Hyperlipidemia  Recommendations: 1. Continue heparin drip 2. Continue amlodipine, metoprolol (heart rate permitting), potassium supplement 3. Proceed with cardiac catheterization with potential PCI tomorrow morning.   Signed: Clabe Seal PA-C 02/23/2018, 8:55 AM

## 2018-02-23 NOTE — Consult Note (Signed)
ANTICOAGULATION CONSULT NOTE - Initial Consult  Pharmacy Consult for heparin infusion Indication: chest pain/ACS  Allergies  Allergen Reactions  . Doxazosin Other (See Comments)    "headache"  . Fish Allergy Anaphylaxis, Itching, Nausea Only and Swelling    She states her tongue turns black.  . Dicloxacillin Nausea And Vomiting    Has patient had a PCN reaction causing immediate rash, facial/tongue/throat swelling, SOB or lightheadedness with hypotension: No Has patient had a PCN reaction causing severe rash involving mucus membranes or skin necrosis: No Has patient had a PCN reaction that required hospitalization: Unknown Has patient had a PCN reaction occurring within the last 10 years: Unknown If all of the above answers are "NO", then may proceed with Cephalosporin use.   . Iodine Hives    Per patient, she had a previous reaction/hives from an iodine injection  . Sulfa Antibiotics Swelling    Loss of appetite   Constipation bloating  . Bactrim [Sulfamethoxazole-Trimethoprim] Nausea Only  . Beta Adrenergic Blockers Other (See Comments)    "Unknown"  . Cardura [Doxazosin Mesylate]     Headache   . Etodolac Itching and Swelling  . Azithromycin Itching  . Clopidogrel Nausea And Vomiting  . Colchicine Nausea Only and Other (See Comments)    Dizzness  . Glucosamine Nausea And Vomiting  . Leflunomide Diarrhea and Itching  . Meloxicam Itching  . Methotrexate Nausea Only  . Zoloft [Sertraline] Nausea And Vomiting    Patient Measurements: Height: 5\' 2"  (157.5 cm) Weight: 179 lb 6.4 oz (81.4 kg) IBW/kg (Calculated) : 50.1 Heparin Dosing Weight: 68.1  Vital Signs: Temp: 98.1 F (36.7 C) (11/19 0401) Temp Source: Oral (11/19 0401) BP: 134/85 (11/19 0401) Pulse Rate: 66 (11/19 0401)  Labs: Recent Labs    02/22/18 1821 02/22/18 2237 02/23/18 0340  HGB 13.3  --  12.0  HCT 36.8  --  33.0*  PLT 350  --  298  APTT 30  --   --   LABPROT 12.1  --   --   INR 0.90  --   --    HEPARINUNFRC  --   --  0.43  CREATININE 0.97  --  0.67  TROPONINI 0.57* 0.60* 0.57*    Estimated Creatinine Clearance: 63.7 mL/min (by C-G formula based on SCr of 0.67 mg/dL).   Medical History: Past Medical History:  Diagnosis Date  . Anginal pain ([place]) 06/2016   being followed by dr. Ubaldo Glassing  . Anxiety   . Arthritis    Osteoarthritis  . Arthritis    Rheumatoid  . Breast cancer ([place]) 2000   Right Breast - Chemotherapy  . 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  . Coronary artery disease   . Diabetes mellitus without complication [facility])    Patient takes Metformin  . Dyspnea 03/2017  . GERD (gastroesophageal reflux disease)   . History of abdominal hysterectomy   . History of eyelid surgery   . History of kidney stones   . Hyperlipidemia   . Hypertension   . Lumbago   . Myocardial infarction ([place]) 07/2010  . Orthopnea 03/2017  . Personal history of chemotherapy 2000   right breast ca  . Sinus disorder   . Stroke [facility]) 2006, 2012  . Wears dentures    full upper and lower    Medications:  Medications Prior to Admission  Medication Sig Dispense Refill Last Dose  . ALPRAZolam (XANAX) 0.25 MG tablet Take 0.25 mg by mouth at bedtime.  02/21/2018 at pm  . amLODipine (NORVASC) 5 MG tablet Take 5 mg by mouth 2 (two) times daily.    02/22/2018 at am  . aspirin EC 81 MG tablet Take 81 mg by mouth daily.   02/22/2018 at am  . BIOTIN PO Take 1 tablet by mouth daily.   02/22/2018 at am  . bisacodyl (DULCOLAX) 5 MG EC tablet Take 5 mg by mouth daily as needed for moderate constipation.   unknown at unknown  . diphenhydrAMINE (BENADRYL) 25 MG tablet Take 25 mg by mouth daily as needed for allergies.   unknown at unknown  . hydrochlorothiazide (HYDRODIURIL) 25 MG tablet Take 25 mg by mouth daily.    02/22/2018 at am  . hydrOXYzine (ATARAX/VISTARIL) 10 MG tablet Take 10 mg by mouth daily.    02/22/2018 at am  . ibuprofen (ADVIL,MOTRIN) 200 MG tablet Take 600 mg by  mouth 2 (two) times daily as needed for moderate pain.   unknown at unknown  . Menthol-Methyl Salicylate (MUSCLE RUB) 10-15 % CREA Apply 1 application topically as needed for muscle pain.   unknown at unknown  . metFORMIN (GLUCOPHAGE) 500 MG tablet Take 500 mg by mouth 2 (two) times daily with a meal.   02/22/2018 at am  . metoprolol succinate (TOPROL-XL) 100 MG 24 hr tablet Take 100 mg by mouth every evening.    02/21/2018 at 1930  . nitroGLYCERIN (NITROSTAT) 0.4 MG SL tablet Place 0.4 mg under the tongue every 5 (five) minutes as needed for chest pain.    unknown at unknown  . nystatin (MYCOSTATIN) 100000 UNIT/ML suspension Take 5 mLs by mouth 4 (four) times daily.   02/22/2018 at am  . omeprazole (PRILOSEC) 20 MG capsule Take 20 mg by mouth daily.   02/22/2018 at am  . Polyvinyl Alcohol-Povidone (MURINE TEARS FOR DRY EYES OP) Apply 1 drop to eye daily as needed (dry eyes).   unknown at unknown  . potassium chloride (K-DUR,KLOR-CON) 10 MEQ tablet Take 10 mEq by mouth daily.    02/22/2018 at am  . Tofacitinib Citrate (XELJANZ) 5 MG TABS Take 5 mg by mouth 2 (two) times daily.    02/22/2018 at am    Assessment: 72 y.o. female  with a history of CAD with one stent presents the ER with several days of intermittent but progressively worsening nonexertional chest pressure and pain associated with some nausea.  Per chart review no listed anticoagulation PTA.  Goal of Therapy:  Heparin level 0.3-0.7 units/ml Monitor platelets by anticoagulation protocol: Yes   Plan:  11/19 @ 0340 HL 0.43 therapeutic will continue current rate and will recheck @ 1200.  Tobie Lords, PharmD Clinical Pharmacist 02/23/2018,4:55 AM

## 2018-02-23 NOTE — Progress Notes (Addendum)
[place] [facility] [place] NAME: Elaine Cox    MR#:  [id number]  DATE OF BIRTH:  [birth year]  SUBJECTIVE:  CHIEF COMPLAINT:   Chief Complaint  Patient presents with  . Chest Pain  Patient seen and evaluated today No new episodes of chest pain No shortness of breath No palpitations   REVIEW OF SYSTEMS:    ROS  CONSTITUTIONAL: No documented fever. No fatigue, weakness. No weight gain, no weight loss.  EYES: No blurry or double vision.  ENT: No tinnitus. No postnasal drip. No redness of the oropharynx.  RESPIRATORY: No cough, no wheeze, no hemoptysis. No dyspnea.  CARDIOVASCULAR: No chest pain. No orthopnea. No palpitations. No syncope.  GASTROINTESTINAL: No nausea, no vomiting or diarrhea. No abdominal pain. No melena or hematochezia.  GENITOURINARY: No dysuria or hematuria.  ENDOCRINE: No polyuria or nocturia. No heat or cold intolerance.  HEMATOLOGY: No anemia. No bruising. No bleeding.  INTEGUMENTARY: No rashes. No lesions.  MUSCULOSKELETAL: No arthritis. No swelling. No gout.  NEUROLOGIC: No numbness, tingling, or ataxia. No seizure-type activity.  PSYCHIATRIC: No anxiety. No insomnia. No ADD.   DRUG ALLERGIES:   Allergies  Allergen Reactions  . Doxazosin Other (See Comments)    "headache"  . Fish Allergy Anaphylaxis, Itching, Nausea Only and Swelling    She states her tongue turns black.  . Dicloxacillin Nausea And Vomiting    Has patient had a PCN reaction causing immediate rash, facial/tongue/throat swelling, SOB or lightheadedness with hypotension: No Has patient had a PCN reaction causing severe rash involving mucus membranes or skin necrosis: No Has patient had a PCN reaction that required hospitalization: Unknown Has patient had a PCN reaction occurring within the last 10 years: Unknown If all of the above answers are "NO", then may proceed with Cephalosporin use.   . Iodine Hives    Per patient, she had a previous  reaction/hives from an iodine injection  . Sulfa Antibiotics Swelling    Loss of appetite   Constipation bloating  . Bactrim [Sulfamethoxazole-Trimethoprim] Nausea Only  . Beta Adrenergic Blockers Other (See Comments)    "Unknown"  . Cardura [Doxazosin Mesylate]     Headache   . Etodolac Itching and Swelling  . Azithromycin Itching  . Clopidogrel Nausea And Vomiting  . Colchicine Nausea Only and Other (See Comments)    Dizzness  . Glucosamine Nausea And Vomiting  . Leflunomide Diarrhea and Itching  . Meloxicam Itching  . Methotrexate Nausea Only  . Zoloft [Sertraline] Nausea And Vomiting    VITALS:  Blood pressure 117/66, pulse 63, temperature 98.6 F (37 C), temperature source Oral, resp. rate 15, height 5\' 2"  (1.575 m), weight 81.4 kg, SpO2 98 %.  PHYSICAL EXAMINATION:   Physical Exam  GENERAL:  72 y.o.-year-old patient lying in the bed with no acute distress.  EYES: Pupils equal, round, reactive to light and accommodation. No scleral icterus. Extraocular muscles intact.  HEENT: Head atraumatic, normocephalic. Oropharynx and nasopharynx clear.  NECK:  Supple, no jugular venous distention. No thyroid enlargement, no tenderness.  LUNGS: Normal breath sounds bilaterally, no wheezing, rales, rhonchi. No use of accessory muscles of respiration.  CARDIOVASCULAR: S1, S2 normal. No murmurs, rubs, or gallops.  ABDOMEN: Soft, nontender, nondistended. Bowel sounds present. No organomegaly or mass.  EXTREMITIES: No cyanosis, clubbing or edema b/l.    NEUROLOGIC: Cranial nerves II through XII are intact. No focal Motor or sensory deficits b/l.   PSYCHIATRIC: The patient is alert and oriented  x 3.  SKIN: No obvious rash, lesion, or ulcer.   LABORATORY PANEL:   CBC Recent Labs  Lab 02/23/18 0340  WBC 5.6  HGB 12.0  HCT 33.0*  PLT 298   ------------------------------------------------------------------------------------------------------------------ Chemistries  Recent Labs   Lab 02/23/18 0340 02/23/18 0933  NA 137  --   K 2.8*  --   CL 100  --   CO2 30  --   GLUCOSE 137*  --   BUN 11  --   CREATININE 0.67  --   CALCIUM 9.0  --   MG  --  1.8   ------------------------------------------------------------------------------------------------------------------  Cardiac Enzymes Recent Labs  Lab 02/23/18 0933  TROPONINI 0.71*   ------------------------------------------------------------------------------------------------------------------  RADIOLOGY:  Dg Chest 2 View  Result Date: 02/22/2018 CLINICAL DATA:  Chest pain. EXAM: CHEST - 2 VIEW COMPARISON:  05/18/2012 FINDINGS: The heart size and mediastinal contours are within normal limits. Both lungs are clear. The visualized skeletal structures are unremarkable. IMPRESSION: No active cardiopulmonary disease. Electronically Signed   By: Kerby Moors M.D.   On: 02/22/2018 18:50     ASSESSMENT AND PLAN:  72 year old female patient with history of breast cancer, collagen vascular disease, coronary artery disease, history of cardiac stents, diabetes mellitus type 2, hyperlipidemia, hypertension currently under hospitalist service for chest pain  -Non-STEMI Continue anticoagulation with IV heparin drip Status post cardiology evaluation due for cardiac cath in a.m. and further intervention Continue aspirin and nitrates  -Acute hypokalemia Replace potassium aggressively  -Type 2 diabetes mellitus Hold metformin for now Sliding scale coverage with insulin for sugars control  -GERD And new proton pump inhibitor  -History of CVA Continue aspirin  -DVT prophylaxis On anti-correlation with heparin drip  All the records are reviewed and case discussed with Care Management/Social Worker. Management plans discussed with the patient, family and they are in agreement.  CODE STATUS: Full code  DVT Prophylaxis: SCDs  TOTAL TIME TAKING CARE OF THIS PATIENT: 34 minutes.   POSSIBLE D/C IN 1 to 2  DAYS, DEPENDING ON CLINICAL CONDITION.  Saundra Shelling M.D on 02/23/2018 at 1:44 PM  Between 7am to 6pm - Pager - [phone]  After 6pm go to [email or web address] - password [email or web address] [place] [facility]  Office  [phone]  CC: Primary care physician; Tracie Harrier, MD  Note: This dictation was prepared with Dragon dictation along with smaller phrase technology. Any transcriptional errors that result from this process are unintentional.

## 2018-02-23 NOTE — Plan of Care (Signed)
  Problem: Clinical Measurements: Goal: Will remain free from infection Outcome: Progressing Note:  Remains afebrile Goal: Cardiovascular complication will be avoided Outcome: Progressing Note:  No arrhythmias today   Problem: Cardiac: Goal: Ability to achieve and maintain adequate cardiovascular perfusion will improve Outcome: Progressing Note:  Pt scheduled for catheterization tomorrow   Problem: Clinical Measurements: Goal: Diagnostic test results will improve Outcome: Not Progressing Note:  Troponins positive peaked at .71 Potassium 2.8., replacing orally

## 2018-02-23 NOTE — Consult Note (Addendum)
ANTICOAGULATION CONSULT NOTE - Follow up  Pharmacy Consult for heparin infusion Indication: chest pain/ACS  Allergies  Allergen Reactions  . Doxazosin Other (See Comments)    "headache"  . Fish Allergy Anaphylaxis, Itching, Nausea Only and Swelling    She states her tongue turns black.  . Dicloxacillin Nausea And Vomiting    Has patient had a PCN reaction causing immediate rash, facial/tongue/throat swelling, SOB or lightheadedness with hypotension: No Has patient had a PCN reaction causing severe rash involving mucus membranes or skin necrosis: No Has patient had a PCN reaction that required hospitalization: Unknown Has patient had a PCN reaction occurring within the last 10 years: Unknown If all of the above answers are "NO", then may proceed with Cephalosporin use.   . Iodine Hives    Per patient, she had a previous reaction/hives from an iodine injection  . Sulfa Antibiotics Swelling    Loss of appetite   Constipation bloating  . Bactrim [Sulfamethoxazole-Trimethoprim] Nausea Only  . Beta Adrenergic Blockers Other (See Comments)    "Unknown"  . Cardura [Doxazosin Mesylate]     Headache   . Etodolac Itching and Swelling  . Azithromycin Itching  . Clopidogrel Nausea And Vomiting  . Colchicine Nausea Only and Other (See Comments)    Dizzness  . Glucosamine Nausea And Vomiting  . Leflunomide Diarrhea and Itching  . Meloxicam Itching  . Methotrexate Nausea Only  . Zoloft [Sertraline] Nausea And Vomiting    Patient Measurements: Height: 5\' 2"  (157.5 cm) Weight: 179 lb 6.4 oz (81.4 kg) IBW/kg (Calculated) : 50.1 Heparin Dosing Weight: 68.1  Vital Signs: Temp: 98.5 F (36.9 C) (11/19 1916) Temp Source: Oral (11/19 1916) BP: 131/68 (11/19 1916) Pulse Rate: 70 (11/19 1916)  Labs: Recent Labs    02/22/18 1821 02/22/18 2237 02/23/18 0340 02/23/18 0933 02/23/18 1229 02/23/18 1928  HGB 13.3  --  12.0  --   --   --   HCT 36.8  --  33.0*  --   --   --   PLT 350  --   298  --   --   --   APTT 30  --   --   --   --   --   LABPROT 12.1  --   --   --   --   --   INR 0.90  --   --   --   --   --   HEPARINUNFRC  --   --  0.43  --  0.21* 0.56  CREATININE 0.97  --  0.67  --   --   --   TROPONINI 0.57* 0.60* 0.57* 0.71*  --   --     Estimated Creatinine Clearance: 63.7 mL/min (by C-G formula based on SCr of 0.67 mg/dL).   Medical History: Past Medical History:  Diagnosis Date  . Anginal pain ([place]) 06/2016   being followed by dr. Ubaldo Glassing  . Anxiety   . Arthritis    Osteoarthritis  . Arthritis    Rheumatoid  . Breast cancer ([place]) 2000   Right Breast - Chemotherapy  . 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  . Coronary artery disease   . Diabetes mellitus without complication [facility])    Patient takes Metformin  . Dyspnea 03/2017  . GERD (gastroesophageal reflux disease)   . History of abdominal hysterectomy   . History of eyelid surgery   . History of kidney stones   . Hyperlipidemia   . Hypertension   .  Lumbago   . Myocardial infarction ([place]) 07/2010  . Orthopnea 03/2017  . Personal history of chemotherapy 2000   right breast ca  . Sinus disorder   . Stroke [facility]) 2006, 2012  . Wears dentures    full upper and lower    Medications:  Medications Prior to Admission  Medication Sig Dispense Refill Last Dose  . ALPRAZolam (XANAX) 0.25 MG tablet Take 0.25 mg by mouth at bedtime.    02/21/2018 at pm  . amLODipine (NORVASC) 5 MG tablet Take 5 mg by mouth 2 (two) times daily.    02/22/2018 at am  . aspirin EC 81 MG tablet Take 81 mg by mouth daily.   02/22/2018 at am  . BIOTIN PO Take 1 tablet by mouth daily.   02/22/2018 at am  . bisacodyl (DULCOLAX) 5 MG EC tablet Take 5 mg by mouth daily as needed for moderate constipation.   unknown at unknown  . diphenhydrAMINE (BENADRYL) 25 MG tablet Take 25 mg by mouth daily as needed for allergies.   unknown at unknown  . hydrochlorothiazide (HYDRODIURIL) 25 MG tablet Take 25 mg by mouth  daily.    02/22/2018 at am  . hydrOXYzine (ATARAX/VISTARIL) 10 MG tablet Take 10 mg by mouth daily.    02/22/2018 at am  . ibuprofen (ADVIL,MOTRIN) 200 MG tablet Take 600 mg by mouth 2 (two) times daily as needed for moderate pain.   unknown at unknown  . Menthol-Methyl Salicylate (MUSCLE RUB) 10-15 % CREA Apply 1 application topically as needed for muscle pain.   unknown at unknown  . metFORMIN (GLUCOPHAGE) 500 MG tablet Take 500 mg by mouth 2 (two) times daily with a meal.   02/22/2018 at am  . metoprolol succinate (TOPROL-XL) 100 MG 24 hr tablet Take 100 mg by mouth every evening.    02/21/2018 at 1930  . nitroGLYCERIN (NITROSTAT) 0.4 MG SL tablet Place 0.4 mg under the tongue every 5 (five) minutes as needed for chest pain.    unknown at unknown  . nystatin (MYCOSTATIN) 100000 UNIT/ML suspension Take 5 mLs by mouth 4 (four) times daily.   02/22/2018 at am  . omeprazole (PRILOSEC) 20 MG capsule Take 20 mg by mouth daily.   02/22/2018 at am  . Polyvinyl Alcohol-Povidone (MURINE TEARS FOR DRY EYES OP) Apply 1 drop to eye daily as needed (dry eyes).   unknown at unknown  . potassium chloride (K-DUR,KLOR-CON) 10 MEQ tablet Take 10 mEq by mouth daily.    02/22/2018 at am  . Tofacitinib Citrate (XELJANZ) 5 MG TABS Take 5 mg by mouth 2 (two) times daily.    02/22/2018 at am    Assessment: 72 y.o. female  with a history of CAD with one stent presents the ER with several days of intermittent but progressively worsening nonexertional chest pressure and pain associated with some nausea.  Per chart review no listed anticoagulation PTA.  Goal of Therapy:  Heparin level 0.3-0.7 units/ml Monitor platelets by anticoagulation protocol: Yes   Plan:  11/19 1930 Heparin level therapeutic at 0.56. Will continue current rate of heparin 950 units/hr and will check another heparin level in 6 hours   Forrest Moron, PharmD Clinical Pharmacist 02/23/2018,8:01 PM

## 2018-02-23 NOTE — Progress Notes (Signed)
Chaplain responded to and OR for an AD. Pt is a little apprehesive about the AD. Chaplain educated pat about AD. Pt begin to talk about the grief she is suffering with numerous losses. Chaplain practiced active listening and space to talk about her grief. Follow up may be advisable   02/23/18 0700  Clinical Encounter Type  Visited With Patient  Visit Type Initial  Referral From Nurse  Spiritual Encounters  Spiritual Needs Brochure;Prayer

## 2018-02-24 ENCOUNTER — Encounter
Admission: EM | Disposition: A | Discharge status: Home / Self Care | Payer: Self-pay | Source: Home / Self Care | Attending: Internal Medicine

## 2018-02-24 ENCOUNTER — Other Ambulatory Visit: Payer: Self-pay

## 2018-02-24 ENCOUNTER — Encounter: Payer: Self-pay | Admitting: *Deleted

## 2018-02-24 HISTORY — PX: CORONARY STENT INTERVENTION: CATH118234

## 2018-02-24 HISTORY — PX: LEFT HEART CATH AND CORONARY ANGIOGRAPHY: CATH118249

## 2018-02-24 LAB — CBC
HCT: 33.1 % — ABNORMAL LOW (ref 36.0–46.0)
HEMOGLOBIN: 12.2 g/dL (ref 12.0–15.0)
MCH: 31 pg (ref 26.0–34.0)
MCHC: 36.9 g/dL — ABNORMAL HIGH (ref 30.0–36.0)
MCV: 84 fL (ref 80.0–100.0)
NRBC: 0 % (ref 0.0–0.2)
PLATELETS: 274 10*3/uL (ref 150–400)
RBC: 3.94 MIL/uL (ref 3.87–5.11)
RDW: 14.3 % (ref 11.5–15.5)
WBC: 5.2 10*3/uL (ref 4.0–10.5)

## 2018-02-24 LAB — BASIC METABOLIC PANEL
Anion gap: 10 (ref 5–15)
BUN: 12 mg/dL (ref 8–23)
CALCIUM: 8.9 mg/dL (ref 8.9–10.3)
CO2: 28 mmol/L (ref 22–32)
Chloride: 100 mmol/L (ref 98–111)
Creatinine, Ser: 0.68 mg/dL (ref 0.44–1.00)
GFR calc Af Amer: >60 mL/min (ref >60)
GLUCOSE: 147 mg/dL — AB (ref 70–99)
POTASSIUM: 3.7 mmol/L (ref 3.5–5.1)
SODIUM: 138 mmol/L (ref 135–145)

## 2018-02-24 LAB — GLUCOSE, CAPILLARY
GLUCOSE-CAPILLARY: 271 mg/dL — AB (ref 70–99)
GLUCOSE-CAPILLARY: 227 mg/dL — AB (ref 70–99)
Glucose-Capillary: 132 mg/dL — ABNORMAL HIGH (ref 70–99)
Glucose-Capillary: 174 mg/dL — ABNORMAL HIGH (ref 70–99)

## 2018-02-24 LAB — HEPARIN LEVEL (UNFRACTIONATED): HEPARIN UNFRACTIONATED: 0.59 [IU]/mL (ref 0.30–0.70)

## 2018-02-24 LAB — POCT ACTIVATED CLOTTING TIME: ACTIVATED CLOTTING TIME: 356 s

## 2018-02-24 SURGERY — LEFT HEART CATH AND CORONARY ANGIOGRAPHY
Anesthesia: Moderate Sedation

## 2018-02-24 MED ORDER — HEPARIN (PORCINE) IN NACL 1000-0.9 UT/500ML-% IV SOLN
INTRAVENOUS | Status: AC
  Filled 2018-02-24: qty 1000

## 2018-02-24 MED ORDER — METHYLPREDNISOLONE SODIUM SUCC 125 MG IJ SOLR
INTRAMUSCULAR | Status: AC
  Filled 2018-02-24: qty 2

## 2018-02-24 MED ORDER — TICAGRELOR 90 MG PO TABS
90.0000 mg | ORAL_TABLET | Freq: Two times a day (BID) | ORAL | Status: DC
  Administered 2018-02-24 – 2018-02-25 (×2): 90 mg via ORAL
  Filled 2018-02-24 (×2): qty 1

## 2018-02-24 MED ORDER — SODIUM CHLORIDE 0.9 % IV SOLN
0.2500 mg/kg/h | INTRAVENOUS | Status: DC
  Filled 2018-02-24: qty 250

## 2018-02-24 MED ORDER — PRASUGREL HCL 10 MG PO TABS: ORAL_TABLET | ORAL | Status: DC | PRN

## 2018-02-24 MED ORDER — ONDANSETRON HCL 4 MG/2ML IJ SOLN: 4.0000 mg | Freq: Four times a day (QID) | INTRAMUSCULAR | Status: DC | PRN

## 2018-02-24 MED ORDER — SODIUM CHLORIDE 0.9% FLUSH: 3.0000 mL | INTRAVENOUS | Status: DC | PRN

## 2018-02-24 MED ORDER — HYDRALAZINE HCL 20 MG/ML IJ SOLN: 5.0000 mg | INTRAMUSCULAR | Status: AC | PRN

## 2018-02-24 MED ORDER — SODIUM CHLORIDE 0.9 % WEIGHT BASED INFUSION
1.0000 mL/kg/h | INTRAVENOUS | Status: AC
  Administered 2018-02-24: 1 mL/kg/h via INTRAVENOUS

## 2018-02-24 MED ORDER — IOPAMIDOL (ISOVUE-300) INJECTION 61%
INTRAVENOUS | Status: DC | PRN
  Administered 2018-02-24 (×2): 485 mL via INTRA_ARTERIAL

## 2018-02-24 MED ORDER — DIPHENHYDRAMINE HCL 50 MG/ML IJ SOLN
INTRAMUSCULAR | Status: DC | PRN
  Administered 2018-02-24: 50 mg via INTRAVENOUS

## 2018-02-24 MED ORDER — MIDAZOLAM HCL 2 MG/2ML IJ SOLN
INTRAMUSCULAR | Status: AC
  Filled 2018-02-24: qty 2

## 2018-02-24 MED ORDER — ACETAMINOPHEN 325 MG PO TABS: 650.0000 mg | ORAL_TABLET | ORAL | Status: DC | PRN

## 2018-02-24 MED ORDER — FENTANYL CITRATE (PF) 100 MCG/2ML IJ SOLN
INTRAMUSCULAR | Status: DC | PRN
  Administered 2018-02-24 (×2): 25 ug via INTRAVENOUS

## 2018-02-24 MED ORDER — TICAGRELOR 90 MG PO TABS
ORAL_TABLET | ORAL | Status: AC
  Filled 2018-02-24: qty 2

## 2018-02-24 MED ORDER — PRASUGREL HCL 10 MG PO TABS
ORAL_TABLET | ORAL | Status: AC
  Filled 2018-02-24: qty 6

## 2018-02-24 MED ORDER — NITROGLYCERIN 1 MG/10 ML FOR IR/CATH LAB
INTRA_ARTERIAL | Status: DC | PRN
  Administered 2018-02-24 (×2): 200 ug via INTRACORONARY

## 2018-02-24 MED ORDER — DIPHENHYDRAMINE HCL 50 MG/ML IJ SOLN
INTRAMUSCULAR | Status: AC
  Filled 2018-02-24: qty 1

## 2018-02-24 MED ORDER — NITROGLYCERIN 5 MG/ML IV SOLN
INTRAVENOUS | Status: AC
  Filled 2018-02-24: qty 10

## 2018-02-24 MED ORDER — BIVALIRUDIN TRIFLUOROACETATE 250 MG IV SOLR
INTRAVENOUS | Status: AC
  Filled 2018-02-24: qty 250

## 2018-02-24 MED ORDER — METHYLPREDNISOLONE SODIUM SUCC 125 MG IJ SOLR
INTRAMUSCULAR | Status: DC | PRN
  Administered 2018-02-24: 125 mg via INTRAVENOUS

## 2018-02-24 MED ORDER — ASPIRIN 81 MG PO CHEW
CHEWABLE_TABLET | ORAL | Status: DC | PRN
  Administered 2018-02-24: 324 mg via ORAL

## 2018-02-24 MED ORDER — MIDAZOLAM HCL 2 MG/2ML IJ SOLN
INTRAMUSCULAR | Status: DC | PRN
  Administered 2018-02-24: 1 mg via INTRAVENOUS

## 2018-02-24 MED ORDER — SODIUM CHLORIDE 0.9 % IV SOLN
INTRAVENOUS | Status: AC | PRN
  Administered 2018-02-24: 1.75 mg/kg/h via INTRAVENOUS

## 2018-02-24 MED ORDER — FENTANYL CITRATE (PF) 100 MCG/2ML IJ SOLN
INTRAMUSCULAR | Status: AC
  Filled 2018-02-24: qty 2

## 2018-02-24 MED ORDER — ASPIRIN 81 MG PO CHEW
81.0000 mg | CHEWABLE_TABLET | Freq: Every day | ORAL | Status: DC
  Filled 2018-02-24: qty 1

## 2018-02-24 MED ORDER — SODIUM CHLORIDE 0.9 % IV SOLN: 250.0000 mL | INTRAVENOUS | Status: DC | PRN

## 2018-02-24 MED ORDER — TICAGRELOR 90 MG PO TABS
ORAL_TABLET | ORAL | Status: DC | PRN
  Administered 2018-02-24: 180 mg via ORAL

## 2018-02-24 MED ORDER — BIVALIRUDIN BOLUS VIA INFUSION - CUPID
INTRAVENOUS | Status: DC | PRN
  Administered 2018-02-24: 60.9 mg via INTRAVENOUS

## 2018-02-24 MED ORDER — SODIUM CHLORIDE 0.9% FLUSH
3.0000 mL | Freq: Two times a day (BID) | INTRAVENOUS | Status: DC
  Administered 2018-02-24: 3 mL via INTRAVENOUS

## 2018-02-24 MED ORDER — ASPIRIN 81 MG PO CHEW
CHEWABLE_TABLET | ORAL | Status: AC
  Filled 2018-02-24: qty 4

## 2018-02-24 SURGICAL SUPPLY — 27 items
BALLN MINITREK RX 1.5X12 (BALLOONS) ×4
BALLN MINITREK RX 1.5X15 (BALLOONS) ×4
BALLN TREK RX 2.25X15 (BALLOONS) ×4
BALLOON MINITREK RX 1.5X12 (BALLOONS) IMPLANT
BALLOON MINITREK RX 1.5X15 (BALLOONS) IMPLANT
BALLOON TREK RX 2.25X15 (BALLOONS) IMPLANT
CATH INFINITI 5 FR AL2 (CATHETERS) ×2 IMPLANT
CATH INFINITI 5FR AL1 (CATHETERS) ×2 IMPLANT
CATH INFINITI 5FR ANG PIGTAIL (CATHETERS) ×2 IMPLANT
CATH INFINITI 5FR JL4 (CATHETERS) ×4 IMPLANT
CATH INFINITI JR4 5F (CATHETERS) ×2 IMPLANT
CATH VISTA GUIDE 6FR AL2 (CATHETERS) ×2 IMPLANT
CATH VISTA GUIDE 6FR XB3.5 (CATHETERS) ×2 IMPLANT
DEVICE CLOSURE MYNXGRIP 6/7F (Vascular Products) ×2 IMPLANT
DEVICE INFLAT 30 PLUS (MISCELLANEOUS) ×4 IMPLANT
DEVICE SAFEGUARD 24CM (GAUZE/BANDAGES/DRESSINGS) ×2 IMPLANT
KIT MANI 3VAL PERCEP (MISCELLANEOUS) ×4 IMPLANT
NDL PERC 18GX7CM (NEEDLE) IMPLANT
NEEDLE PERC 18GX7CM (NEEDLE) ×4 IMPLANT
PACK CARDIAC CATH (CUSTOM PROCEDURE TRAY) ×4 IMPLANT
SHEATH AVANTI 5FR X 11CM (SHEATH) ×2 IMPLANT
SHEATH AVANTI 6FR X 11CM (SHEATH) ×2 IMPLANT
STENT RESOLUTE ONYX 2.25X12 (Permanent Stent) ×2 IMPLANT
STENT RESOLUTE ONYX 2.5X15 (Permanent Stent) ×2 IMPLANT
WIRE ASAHI PROWATER 180CM (WIRE) ×4 IMPLANT
WIRE GUIDERIGHT .035X150 (WIRE) ×2 IMPLANT
WIRE RUNTHROUGH .014X180CM (WIRE) ×2 IMPLANT

## 2018-02-24 NOTE — Consult Note (Signed)
ANTICOAGULATION CONSULT NOTE - Follow up  Pharmacy Consult for heparin infusion Indication: chest pain/ACS  Allergies  Allergen Reactions  . Doxazosin Other (See Comments)    "headache"  . Fish Allergy Anaphylaxis, Itching, Nausea Only and Swelling    She states her tongue turns black.  . Dicloxacillin Nausea And Vomiting    Has patient had a PCN reaction causing immediate rash, facial/tongue/throat swelling, SOB or lightheadedness with hypotension: No Has patient had a PCN reaction causing severe rash involving mucus membranes or skin necrosis: No Has patient had a PCN reaction that required hospitalization: Unknown Has patient had a PCN reaction occurring within the last 10 years: Unknown If all of the above answers are "NO", then may proceed with Cephalosporin use.   . Iodine Hives    Per patient, she had a previous reaction/hives from an iodine injection  . Sulfa Antibiotics Swelling    Loss of appetite   Constipation bloating  . Bactrim [Sulfamethoxazole-Trimethoprim] Nausea Only  . Beta Adrenergic Blockers Other (See Comments)    "Unknown"  . Cardura [Doxazosin Mesylate]     Headache   . Etodolac Itching and Swelling  . Azithromycin Itching  . Clopidogrel Nausea And Vomiting  . Colchicine Nausea Only and Other (See Comments)    Dizzness  . Glucosamine Nausea And Vomiting  . Leflunomide Diarrhea and Itching  . Meloxicam Itching  . Methotrexate Nausea Only  . Zoloft [Sertraline] Nausea And Vomiting    Patient Measurements: Height: 5\' 2"  (157.5 cm) Weight: 179 lb 6.4 oz (81.4 kg) IBW/kg (Calculated) : 50.1 Heparin Dosing Weight: 68.1  Vital Signs: Temp: 98.5 F (36.9 C) (11/19 1916) Temp Source: Oral (11/19 1916) BP: 131/68 (11/19 1916) Pulse Rate: 70 (11/19 1916)  Labs: Recent Labs    02/22/18 1821 02/22/18 2237  02/23/18 0340 02/23/18 0933 02/23/18 1229 02/23/18 1928 02/24/18 0130  HGB 13.3  --   --  12.0  --   --   --  12.2  HCT 36.8  --   --   33.0*  --   --   --  33.1*  PLT 350  --   --  298  --   --   --  274  APTT 30  --   --   --   --   --   --   --   LABPROT 12.1  --   --   --   --   --   --   --   INR 0.90  --   --   --   --   --   --   --   HEPARINUNFRC  --   --    < > 0.43  --  0.21* 0.56 0.59  CREATININE 0.97  --   --  0.67  --   --   --  0.68  TROPONINI 0.57* 0.60*  --  0.57* 0.71*  --   --   --    < > = values in this interval not displayed.    Estimated Creatinine Clearance: 63.7 mL/min (by C-G formula based on SCr of 0.68 mg/dL).   Medical History: Past Medical History:  Diagnosis Date  . Anginal pain ([place]) 06/2016   being followed by dr. Ubaldo Glassing  . Anxiety   . Arthritis    Osteoarthritis  . Arthritis    Rheumatoid  . Breast cancer ([place]) 2000   Right Breast - Chemotherapy  . Collagen vascular disease (HCC)    Rheumatoid  Arthritis.  . Coronary artery disease   . Diabetes mellitus without complication [facility])    Patient takes Metformin  . Dyspnea 03/2017  . GERD (gastroesophageal reflux disease)   . History of abdominal hysterectomy   . History of eyelid surgery   . History of kidney stones   . Hyperlipidemia   . Hypertension   . Lumbago   . Myocardial infarction ([place]) 07/2010  . Orthopnea 03/2017  . Personal history of chemotherapy 2000   right breast ca  . Sinus disorder   . Stroke [facility]) 2006, 2012  . Wears dentures    full upper and lower    Medications:  Medications Prior to Admission  Medication Sig Dispense Refill Last Dose  . ALPRAZolam (XANAX) 0.25 MG tablet Take 0.25 mg by mouth at bedtime.    02/21/2018 at pm  . amLODipine (NORVASC) 5 MG tablet Take 5 mg by mouth 2 (two) times daily.    02/22/2018 at am  . aspirin EC 81 MG tablet Take 81 mg by mouth daily.   02/22/2018 at am  . BIOTIN PO Take 1 tablet by mouth daily.   02/22/2018 at am  . bisacodyl (DULCOLAX) 5 MG EC tablet Take 5 mg by mouth daily as needed for moderate constipation.   unknown at unknown  . diphenhydrAMINE  (BENADRYL) 25 MG tablet Take 25 mg by mouth daily as needed for allergies.   unknown at unknown  . hydrochlorothiazide (HYDRODIURIL) 25 MG tablet Take 25 mg by mouth daily.    02/22/2018 at am  . hydrOXYzine (ATARAX/VISTARIL) 10 MG tablet Take 10 mg by mouth daily.    02/22/2018 at am  . ibuprofen (ADVIL,MOTRIN) 200 MG tablet Take 600 mg by mouth 2 (two) times daily as needed for moderate pain.   unknown at unknown  . Menthol-Methyl Salicylate (MUSCLE RUB) 10-15 % CREA Apply 1 application topically as needed for muscle pain.   unknown at unknown  . metFORMIN (GLUCOPHAGE) 500 MG tablet Take 500 mg by mouth 2 (two) times daily with a meal.   02/22/2018 at am  . metoprolol succinate (TOPROL-XL) 100 MG 24 hr tablet Take 100 mg by mouth every evening.    02/21/2018 at 1930  . nitroGLYCERIN (NITROSTAT) 0.4 MG SL tablet Place 0.4 mg under the tongue every 5 (five) minutes as needed for chest pain.    unknown at unknown  . nystatin (MYCOSTATIN) 100000 UNIT/ML suspension Take 5 mLs by mouth 4 (four) times daily.   02/22/2018 at am  . omeprazole (PRILOSEC) 20 MG capsule Take 20 mg by mouth daily.   02/22/2018 at am  . Polyvinyl Alcohol-Povidone (MURINE TEARS FOR DRY EYES OP) Apply 1 drop to eye daily as needed (dry eyes).   unknown at unknown  . potassium chloride (K-DUR,KLOR-CON) 10 MEQ tablet Take 10 mEq by mouth daily.    02/22/2018 at am  . Tofacitinib Citrate (XELJANZ) 5 MG TABS Take 5 mg by mouth 2 (two) times daily.    02/22/2018 at am    Assessment: 72 y.o. female  with a history of CAD with one stent presents the ER with several days of intermittent but progressively worsening nonexertional chest pressure and pain associated with some nausea.  Per chart review no listed anticoagulation PTA.  Goal of Therapy:  Heparin level 0.3-0.7 units/ml Monitor platelets by anticoagulation protocol: Yes   Plan:  11/20 0130 Heparin level therapeutic at 0.59. Will continue current rate of heparin 950 units/hr  and will check another heparin  level w/ am labs. CBC stable will continue to monitor trops 0.60 >> 0.57 >> 0.71 will continue to monitor.   Tobie Lords, PharmD Clinical Pharmacist 02/24/2018,2:39 AM

## 2018-02-24 NOTE — Progress Notes (Addendum)
[place] [facility] [place] NAME: Elaine Cox    MR#:  [id number]  DATE OF BIRTH:  May 23, 1945  SUBJECTIVE:  CHIEF COMPLAINT:   Chief Complaint  Patient presents with  . Chest Pain  Patient seen and evaluated today after cardiac cath No new episodes of chest pain No shortness of breath No palpitations Patient tolerated procedure well  REVIEW OF SYSTEMS:    ROS  CONSTITUTIONAL: No documented fever. No fatigue, weakness. No weight gain, no weight loss.  EYES: No blurry or double vision.  ENT: No tinnitus. No postnasal drip. No redness of the oropharynx.  RESPIRATORY: No cough, no wheeze, no hemoptysis. No dyspnea.  CARDIOVASCULAR: No chest pain. No orthopnea. No palpitations. No syncope.  GASTROINTESTINAL: No nausea, no vomiting or diarrhea. No abdominal pain. No melena or hematochezia.  GENITOURINARY: No dysuria or hematuria.  ENDOCRINE: No polyuria or nocturia. No heat or cold intolerance.  HEMATOLOGY: No anemia. No bruising. No bleeding.  INTEGUMENTARY: No rashes. No lesions.  MUSCULOSKELETAL: No arthritis. No swelling. No gout.  NEUROLOGIC: No numbness, tingling, or ataxia. No seizure-type activity.  PSYCHIATRIC: No anxiety. No insomnia. No ADD.   DRUG ALLERGIES:   Allergies  Allergen Reactions  . Doxazosin Other (See Comments)    "headache"  . Fish Allergy Anaphylaxis, Itching, Nausea Only and Swelling    She states her tongue turns black.  . Dicloxacillin Nausea And Vomiting    Has patient had a PCN reaction causing immediate rash, facial/tongue/throat swelling, SOB or lightheadedness with hypotension: No Has patient had a PCN reaction causing severe rash involving mucus membranes or skin necrosis: No Has patient had a PCN reaction that required hospitalization: Unknown Has patient had a PCN reaction occurring within the last 10 years: Unknown If all of the above answers are "NO", then may proceed with Cephalosporin use.   .  Iodine Hives    Per patient, she had a previous reaction/hives from an iodine injection  . Sulfa Antibiotics Swelling    Loss of appetite   Constipation bloating  . Bactrim [Sulfamethoxazole-Trimethoprim] Nausea Only  . Beta Adrenergic Blockers Other (See Comments)    "Unknown"  . Cardura [Doxazosin Mesylate]     Headache   . Etodolac Itching and Swelling  . Azithromycin Itching  . Clopidogrel Nausea And Vomiting  . Colchicine Nausea Only and Other (See Comments)    Dizzness  . Glucosamine Nausea And Vomiting  . Leflunomide Diarrhea and Itching  . Meloxicam Itching  . Methotrexate Nausea Only  . Zoloft [Sertraline] Nausea And Vomiting    VITALS:  Blood pressure 128/72, pulse 67, temperature 98.4 F (36.9 C), temperature source Oral, resp. rate 15, height 5\' 2"  (1.575 m), weight 81.2 kg, SpO2 95 %.  PHYSICAL EXAMINATION:   Physical Exam  GENERAL:  72 year-old patient lying in the bed with no acute distress.  EYES: Pupils equal, round, reactive to light and accommodation. No scleral icterus. Extraocular muscles intact.  HEENT: Head atraumatic, normocephalic. Oropharynx and nasopharynx clear.  NECK:  Supple, no jugular venous distention. No thyroid enlargement, no tenderness.  LUNGS: Normal breath sounds bilaterally, no wheezing, rales, rhonchi. No use of accessory muscles of respiration.  CARDIOVASCULAR: S1, S2 normal. No murmurs, rubs, or gallops.  ABDOMEN: Soft, nontender, nondistended. Bowel sounds present. No organomegaly or mass.  EXTREMITIES: No cyanosis, clubbing or edema b/l.    NEUROLOGIC: Cranial nerves II through XII are intact. No focal Motor or sensory deficits b/l.   PSYCHIATRIC:  The patient is alert and oriented x 3.  SKIN: No obvious rash, lesion, or ulcer.   LABORATORY PANEL:   CBC Recent Labs  Lab 02/24/18 0130  WBC 5.2  HGB 12.2  HCT 33.1*  PLT 274    ------------------------------------------------------------------------------------------------------------------ Chemistries  Recent Labs  Lab 02/23/18 0933 02/24/18 0130  NA  --  138  K  --  3.7  CL  --  100  CO2  --  28  GLUCOSE  --  147*  BUN  --  12  CREATININE  --  0.68  CALCIUM  --  8.9  MG 1.8  --    ------------------------------------------------------------------------------------------------------------------  Cardiac Enzymes Recent Labs  Lab 02/23/18 0933  TROPONINI 0.71*   ------------------------------------------------------------------------------------------------------------------  RADIOLOGY:  Dg Chest 2 View  Result Date: 02/22/2018 CLINICAL DATA:  Chest pain. EXAM: CHEST - 2 VIEW COMPARISON:  05/18/2012 FINDINGS: The heart size and mediastinal contours are within normal limits. Both lungs are clear. The visualized skeletal structures are unremarkable. IMPRESSION: No active cardiopulmonary disease. Electronically Signed   By: Kerby Moors M.D.   On: 02/22/2018 18:50     ASSESSMENT AND PLAN:  72 year old female patient with history of breast cancer, collagen vascular disease, coronary artery disease, history of cardiac stents, diabetes mellitus type 2, hyperlipidemia, hypertension currently under hospitalist service for chest pain  -Non-STEMI Status post cardiac cath and stent placement Continue aspirin, Brilinta, high intensity statin and beta-blocker Status post cardiology evaluation Continue aspirin and nitrates  -Acute hypokalemia resolved Monitor potassium level  -Type 2 diabetes mellitus Hold metformin for now Sliding scale coverage with insulin for sugars control  -GERD And new proton pump inhibitor  -History of CVA Continue aspirin  -DVT prophylaxis Anticoagulation with subcu Lovenox Heparin drip discontinued  All the records are reviewed and case discussed with Care Management/Social Worker. Management plans discussed  with the patient, family and they are in agreement.  CODE STATUS: Full code  DVT Prophylaxis: SCDs  TOTAL TIME TAKING CARE OF THIS PATIENT: 34 minutes.   POSSIBLE D/C IN 1 to 2 DAYS, DEPENDING ON CLINICAL CONDITION.  Saundra Shelling M.D on 02/24/2018 at 3:23 PM  Between 7am to 6pm - Pager - [phone]  After 6pm go to [email or web address] - password [email or web address] [place] [facility]  Office  [phone]  CC: Primary care physician; Tracie Harrier, MD  Note: This dictation was prepared with Dragon dictation along with smaller phrase technology. Any transcriptional errors that result from this process are unintentional.

## 2018-02-24 NOTE — Progress Notes (Signed)
Back from cath lab s/p 2 stents to LAD and kissing balloon to LAD.  IVF infusing well lt hand.  No distress on ra. Denies chest pain.  PAD to rt groin with 75ml air in it.  Pulses equal bil.  Pt to lay flat till 1640, pt and family verbalize understanding.  External catheter in place.  CB in reach, SR up x 2.

## 2018-02-24 NOTE — Progress Notes (Addendum)
Per Dr. Saralyn Pilar Angio max gtt off at 1437, currently running at 0.25mg /kg/hr. Pt to sit up in bed 2 hrs post gtt off, 1637. Heparin gtt discontinued per MD.

## 2018-02-24 NOTE — Progress Notes (Signed)
Pt transferred to cath lab via bed.

## 2018-02-25 ENCOUNTER — Encounter: Payer: Self-pay | Admitting: Cardiology

## 2018-02-25 DIAGNOSIS — Z23 Encounter for immunization: Secondary | ICD-10-CM | POA: Diagnosis not present

## 2018-02-25 DIAGNOSIS — Z955 Presence of coronary angioplasty implant and graft: Secondary | ICD-10-CM

## 2018-02-25 LAB — BASIC METABOLIC PANEL
Anion gap: 7 (ref 5–15)
BUN: 13 mg/dL (ref 8–23)
CALCIUM: 8.9 mg/dL (ref 8.9–10.3)
CO2: 26 mmol/L (ref 22–32)
Chloride: 105 mmol/L (ref 98–111)
Creatinine, Ser: 0.63 mg/dL (ref 0.44–1.00)
GFR calc non Af Amer: >60 mL/min (ref >60)
GLUCOSE: 162 mg/dL — AB (ref 70–99)
Potassium: 3.2 mmol/L — ABNORMAL LOW (ref 3.5–5.1)
Sodium: 138 mmol/L (ref 135–145)

## 2018-02-25 LAB — CBC
HEMATOCRIT: 34.4 % — AB (ref 36.0–46.0)
HEMOGLOBIN: 12.7 g/dL (ref 12.0–15.0)
MCH: 31.1 pg (ref 26.0–34.0)
MCHC: 36.9 g/dL — ABNORMAL HIGH (ref 30.0–36.0)
MCV: 84.3 fL (ref 80.0–100.0)
Platelets: 302 10*3/uL (ref 150–400)
RBC: 4.08 MIL/uL (ref 3.87–5.11)
RDW: 14.4 % (ref 11.5–15.5)
WBC: 11.7 10*3/uL — AB (ref 4.0–10.5)
nRBC: 0 % (ref 0.0–0.2)

## 2018-02-25 LAB — GLUCOSE, CAPILLARY
Glucose-Capillary: 141 mg/dL — ABNORMAL HIGH (ref 70–99)
Glucose-Capillary: 106 mg/dL — ABNORMAL HIGH (ref 70–99)

## 2018-02-25 MED ORDER — ATORVASTATIN CALCIUM 20 MG PO TABS: 80.0000 mg | ORAL_TABLET | Freq: Every day | ORAL | Status: DC

## 2018-02-25 MED ORDER — TICAGRELOR 90 MG PO TABS: 90.0000 mg | ORAL_TABLET | Freq: Two times a day (BID) | ORAL | 1 refills | Status: DC

## 2018-02-25 MED ORDER — ATORVASTATIN CALCIUM 80 MG PO TABS: 80.0000 mg | ORAL_TABLET | Freq: Every day | ORAL | 1 refills | Status: DC

## 2018-02-25 NOTE — Care Management Note (Signed)
Case Management Note  Patient Details  Name: Elaine Cox MRN: [id number] Date of Birth: 05/17/1945  Subjective/Objective:    Patient is from home; lives with daughter.  She is status post cardiac catheterization with stent placement. 30 day free coupon for Brilinta given to patient.  Called her insurance company and the patient will not have a co-pay for this medication.  Told patient to hold onto the coupon in case that changes.  She gets her medications at [facility] on [address] and mail order. She is current with her PCP.  Independent at home.  No home health services indicated.  Patient is on room air and plans to discharge today with daughter.            Action/Plan:   Expected Discharge Date:                  Expected Discharge Plan:  Home/Self Care  In-House Referral:     Discharge planning Services  CM Consult  Post Acute Care Choice:    Choice offered to:     DME Arranged:    DME Agency:     HH Arranged:    HH Agency:     Status of Service:  Completed, signed off  If discussed at [employer] of Stay Meetings, dates discussed:    Additional Comments:  Elza Rafter, RN 02/25/2018, 9:40 AM

## 2018-02-25 NOTE — Progress Notes (Signed)
Discharge instructions explained to pt and pts daughter / verbalized an understanding/ iv and tele removed/ RX given to pt/ will transport off unit via wheelchair.

## 2018-02-25 NOTE — Progress Notes (Addendum)
Cardiovascular and Pulmonary Nurse Navigator Note:    72 year old female with known history of angina, arthritis, breast cancer, collagen vascular disease, CAD status post stents, DM, HLD, HTN, stroke - who presented with c/o central chest pain.  Patient ruled in for NSTEMI and underwent Cardiac Cath yesterday.  Status post overlapping DES mid LAD. Cardiac catheterization revealed normal left ventricular function with residual 75% distal RCA  Rounded on patient.  Patient sitting on the side of the bed.  Daughter present in the room.  Patient gave permission for this RN to speak in front of her daughter.    Reviewed the location of CAD and where her stent was placed. Informed patient she will be given a stent card. Explained the purpose of the stent card. Instructed patient to keep stent card in her wallet.  ? Discussed modifiable risk factors including controlling blood pressure, cholesterol, and blood sugar; following heart healthy diet; maintaining healthy weight; exercise; and smoking cessation, if appicable.  ? Discussed cardiac medications including rationale for taking, mechanisms of action, and side effects. Stressed the importance of taking medications as prescribed.  ? Discussed emergency plan for heart attack symptoms. Patient verbalized understanding of need to call 911 and not to drive herself to ER if having cardiac symptoms / chest pain.  ? Heart healthy diet of low sodium, low fat, low cholesterol heart healthy diet discussed. Information on diet provided.  ? Smoking Cessation - Patient is a NEVER smoker.   ? Exercise - Benefits of exercised discussed. 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  Patient has [place].  Patient currently exercises at the [facility].    Informed patient her cardiologist has referred her to outpatient Cardiac Rehab. An overview of the program was provided. Brochure, informational letter, class and orientation  times, and CPT billing codes given to patient. Patient is interested in participating. Patient plans to check with her insurance company to see what her out-of-pocket expenses will be. Patient is scheduled to come to Cardiac Rehab Orientation on Monday, December 2. 2019 at 10:30 a.m.  [place] Orientation packet given to patient and she was instructed to complete questionnaires prior to coming to Cardiac Rehab Orientation appointment.    [facility] - Patient provided information on the [facility] [employer].  Patient given brochure and encouraged to attend.  Next meeting Tuesday, March 16, 2018 at 11:30 a.m.   ? Patient / daughter appreciative of the information.  ? Roanna Epley, RN, BSN, [place]  [facility] Cardiac & Pulmonary Rehab  Cardiovascular & Pulmonary Nurse Navigator  Direct Line: [phone]  Department Phone #: [phone] Fax: [phone]  Email Address: [email or web address]

## 2018-02-25 NOTE — Progress Notes (Signed)
Pt ambulated around nursing station multiple times/ tolerated well/ will continue to monitor.

## 2018-02-25 NOTE — Progress Notes (Signed)
[facility] Cardiology  SUBJECTIVE: The patient reports feeling better this morning. She denies chest pain. She has exertional shortness of breath, which is chronic.    Vitals:   02/24/18 1717 02/24/18 2055 02/25/18 0520 02/25/18 0715  BP: 140/62 (!) 131/55 132/62 139/70  Pulse: 63 65 66 65  Resp: 17 18 18 19   Temp: 98.2 F (36.8 C) 98.4 F (36.9 C) 98.2 F (36.8 C) 98.7 F (37.1 C)  TempSrc: Oral Oral Oral Oral  SpO2: 98% 96% 97% 98%  Weight:      Height:         Intake/Output Summary (Last 24 hours) at 02/25/2018 2951 Last data filed at 02/25/2018 0300 Gross per 24 hour  Intake 0 ml  Output 600 ml  Net -600 ml      PHYSICAL EXAM  General: Well developed, well nourished, in no acute distress, standing up at the sink HEENT:  Normocephalic and atramatic Neck:  No JVD.  Lungs: Normal effort of breathing on room air Heart: HRRR . Normal S1 and S2 without gallops or murmurs.  Abdomen: nondistended  Msk:  Back normal, normal gait. Normal strength and tone for age. Extremities: No clubbing, cyanosis or edema.   Neuro: Alert and oriented X 3. Psych:  Good affect, responds appropriately   LABS: Basic Metabolic Panel: Recent Labs    02/23/18 0933 02/24/18 0130 02/25/18 0422  NA  --  138 138  K  --  3.7 3.2*  CL  --  100 105  CO2  --  28 26  GLUCOSE  --  147* 162*  BUN  --  12 13  CREATININE  --  0.68 0.63  CALCIUM  --  8.9 8.9  MG 1.8  --   --    Liver Function Tests: No results for input(s): AST, ALT, ALKPHOS, BILITOT, PROT, ALBUMIN in the last 72 hours. No results for input(s): LIPASE, AMYLASE in the last 72 hours. CBC: Recent Labs    02/24/18 0130 02/25/18 0422  WBC 5.2 11.7*  HGB 12.2 12.7  HCT 33.1* 34.4*  MCV 84.0 84.3  PLT 274 302   Cardiac Enzymes: Recent Labs    02/22/18 2237 02/23/18 0340 02/23/18 0933  TROPONINI 0.60* 0.57* 0.71*   BNP: Invalid input(s): POCBNP D-Dimer: No results for input(s): DDIMER in the last 72 hours. Hemoglobin  A1C: Recent Labs    02/23/18 0340  HGBA1C 6.2*   Fasting Lipid Panel: No results for input(s): CHOL, HDL, LDLCALC, TRIG, CHOLHDL, LDLDIRECT in the last 72 hours. Thyroid Function Tests: No results for input(s): TSH, T4TOTAL, T3FREE, THYROIDAB in the last 72 hours.  Invalid input(s): FREET3 Anemia Panel: No results for input(s): VITAMINB12, FOLATE, FERRITIN, TIBC, IRON, RETICCTPCT in the last 72 hours.  No results found.    TELEMETRY: Sinus rhythm, 64 bpm  ASSESSMENT AND PLAN:  Principal Problem:   NSTEMI (non-ST elevated myocardial infarction) (HCC) Active Problems:   Anxiety   Diabetes (HCC)   HTN (hypertension)   GERD (gastroesophageal reflux disease)   CAD (coronary artery disease)   HLD (hyperlipidemia)    1. Non-STEMI, status post overlapping DES mid LAD. Cardiac catheterization revealed normal left ventricular function with residual 75% distal RCA 2. Hypertension  Recommendations: 1. Continue Brilinta and aspirin uninterrupted for 1 year 2. Atorvastatin 80 mg 3. Continue metoprolol and hydrochlorothiazide 4. Cardiac rehab 5. Follow-up with Dr. Ubaldo Glassing in 1 week   Clabe Seal, PA-C 02/25/2018 8:33 AM

## 2018-02-25 NOTE — Care Management Important Message (Signed)
Discharged prior to arrival to floor to deliver concurrent Medicare IM.

## 2018-03-03 ENCOUNTER — Other Ambulatory Visit: Payer: Self-pay

## 2018-03-03 DIAGNOSIS — I214 Non-ST elevation (NSTEMI) myocardial infarction: Secondary | ICD-10-CM | POA: Diagnosis not present

## 2018-03-03 DIAGNOSIS — E7849 Other hyperlipidemia: Secondary | ICD-10-CM | POA: Diagnosis not present

## 2018-03-03 DIAGNOSIS — I10 Essential (primary) hypertension: Secondary | ICD-10-CM | POA: Diagnosis not present

## 2018-03-03 DIAGNOSIS — E119 Type 2 diabetes mellitus without complications: Secondary | ICD-10-CM | POA: Diagnosis not present

## 2018-03-03 DIAGNOSIS — I2510 Atherosclerotic heart disease of native coronary artery without angina pectoris: Secondary | ICD-10-CM | POA: Diagnosis not present

## 2018-03-03 DIAGNOSIS — L739 Follicular disorder, unspecified: Secondary | ICD-10-CM | POA: Diagnosis not present

## 2018-03-03 NOTE — Patient Outreach (Signed)
[facility]) Care Management  03/03/2018  Elaine Cox 1945-04-17 [id number]   EMMI- General Discharge RED ON EMMI ALERT Day # 4 Date: 03/02/18 Red Alert Reason:  Sad/hopeless/anxious/empty? Yes   Outreach attempt: Spoke with patient.  She is able to verify HIPAA.  Patient states she is doing fine and has an appointment with the physician this am.  Discussed red alert. Patient states that she has no problems with depression or feeling sad.  PHQ-2= 0.  Patient states she has no problems with her medication.  Patient states some soreness at surgical site but she knows that is expected. Discussed signs of infection to surgical site. Patient declines any needs at this time.     Plan: RN CM will close case.    Jone Baseman, RN, MSN [facility] Care Management Care Management Coordinator Direct Line [phone] Toll Free: [phone]  Fax: [phone]

## 2018-03-07 ENCOUNTER — Emergency Department: Payer: Medicare HMO

## 2018-03-07 ENCOUNTER — Other Ambulatory Visit: Payer: Self-pay

## 2018-03-07 ENCOUNTER — Observation Stay
Admission: EM | Admit: 2018-03-07 | Discharge: 2018-03-09 | Disposition: A | Discharge status: Home / Self Care | Payer: Medicare HMO | Attending: Internal Medicine | Admitting: Internal Medicine

## 2018-03-07 DIAGNOSIS — R0600 Dyspnea, unspecified: Secondary | ICD-10-CM | POA: Insufficient documentation

## 2018-03-07 DIAGNOSIS — M4856XA Collapsed vertebra, not elsewhere classified, lumbar region, initial encounter for fracture: Secondary | ICD-10-CM | POA: Insufficient documentation

## 2018-03-07 DIAGNOSIS — Z955 Presence of coronary angioplasty implant and graft: Secondary | ICD-10-CM | POA: Insufficient documentation

## 2018-03-07 DIAGNOSIS — I700 Atherosclerosis of aorta: Secondary | ICD-10-CM | POA: Insufficient documentation

## 2018-03-07 DIAGNOSIS — Z794 Long term (current) use of insulin: Secondary | ICD-10-CM | POA: Insufficient documentation

## 2018-03-07 DIAGNOSIS — Z880 Allergy status to penicillin: Secondary | ICD-10-CM | POA: Insufficient documentation

## 2018-03-07 DIAGNOSIS — Z881 Allergy status to other antibiotic agents status: Secondary | ICD-10-CM | POA: Insufficient documentation

## 2018-03-07 DIAGNOSIS — Z853 Personal history of malignant neoplasm of breast: Secondary | ICD-10-CM | POA: Insufficient documentation

## 2018-03-07 DIAGNOSIS — F419 Anxiety disorder, unspecified: Secondary | ICD-10-CM | POA: Diagnosis present

## 2018-03-07 DIAGNOSIS — Z791 Long term (current) use of non-steroidal anti-inflammatories (NSAID): Secondary | ICD-10-CM | POA: Insufficient documentation

## 2018-03-07 DIAGNOSIS — R55 Syncope and collapse: Secondary | ICD-10-CM | POA: Diagnosis not present

## 2018-03-07 DIAGNOSIS — K219 Gastro-esophageal reflux disease without esophagitis: Secondary | ICD-10-CM | POA: Diagnosis present

## 2018-03-07 DIAGNOSIS — I10 Essential (primary) hypertension: Secondary | ICD-10-CM | POA: Diagnosis present

## 2018-03-07 DIAGNOSIS — Z79899 Other long term (current) drug therapy: Secondary | ICD-10-CM | POA: Insufficient documentation

## 2018-03-07 DIAGNOSIS — M359 Systemic involvement of connective tissue, unspecified: Secondary | ICD-10-CM | POA: Insufficient documentation

## 2018-03-07 DIAGNOSIS — M25559 Pain in unspecified hip: Secondary | ICD-10-CM

## 2018-03-07 DIAGNOSIS — Z7902 Long term (current) use of antithrombotics/antiplatelets: Secondary | ICD-10-CM | POA: Diagnosis not present

## 2018-03-07 DIAGNOSIS — I252 Old myocardial infarction: Secondary | ICD-10-CM | POA: Diagnosis not present

## 2018-03-07 DIAGNOSIS — R079 Chest pain, unspecified: Secondary | ICD-10-CM | POA: Diagnosis not present

## 2018-03-07 DIAGNOSIS — Z833 Family history of diabetes mellitus: Secondary | ICD-10-CM | POA: Insufficient documentation

## 2018-03-07 DIAGNOSIS — Z87442 Personal history of urinary calculi: Secondary | ICD-10-CM | POA: Insufficient documentation

## 2018-03-07 DIAGNOSIS — K5730 Diverticulosis of large intestine without perforation or abscess without bleeding: Secondary | ICD-10-CM | POA: Insufficient documentation

## 2018-03-07 DIAGNOSIS — E782 Mixed hyperlipidemia: Secondary | ICD-10-CM | POA: Insufficient documentation

## 2018-03-07 DIAGNOSIS — Z8673 Personal history of transient ischemic attack (TIA), and cerebral infarction without residual deficits: Secondary | ICD-10-CM | POA: Diagnosis not present

## 2018-03-07 DIAGNOSIS — N200 Calculus of kidney: Secondary | ICD-10-CM | POA: Diagnosis not present

## 2018-03-07 DIAGNOSIS — Z8371 Family history of colonic polyps: Secondary | ICD-10-CM | POA: Insufficient documentation

## 2018-03-07 DIAGNOSIS — E119 Type 2 diabetes mellitus without complications: Secondary | ICD-10-CM | POA: Diagnosis not present

## 2018-03-07 DIAGNOSIS — R531 Weakness: Secondary | ICD-10-CM | POA: Diagnosis not present

## 2018-03-07 DIAGNOSIS — E785 Hyperlipidemia, unspecified: Secondary | ICD-10-CM | POA: Diagnosis present

## 2018-03-07 DIAGNOSIS — E876 Hypokalemia: Secondary | ICD-10-CM | POA: Insufficient documentation

## 2018-03-07 DIAGNOSIS — M069 Rheumatoid arthritis, unspecified: Secondary | ICD-10-CM | POA: Diagnosis not present

## 2018-03-07 DIAGNOSIS — Z9221 Personal history of antineoplastic chemotherapy: Secondary | ICD-10-CM | POA: Insufficient documentation

## 2018-03-07 DIAGNOSIS — Z882 Allergy status to sulfonamides status: Secondary | ICD-10-CM | POA: Insufficient documentation

## 2018-03-07 DIAGNOSIS — Z888 Allergy status to other drugs, medicaments and biological substances status: Secondary | ICD-10-CM | POA: Insufficient documentation

## 2018-03-07 DIAGNOSIS — Z90710 Acquired absence of both cervix and uterus: Secondary | ICD-10-CM | POA: Diagnosis not present

## 2018-03-07 DIAGNOSIS — I951 Orthostatic hypotension: Secondary | ICD-10-CM | POA: Diagnosis not present

## 2018-03-07 DIAGNOSIS — I340 Nonrheumatic mitral (valve) insufficiency: Secondary | ICD-10-CM | POA: Insufficient documentation

## 2018-03-07 DIAGNOSIS — Z7982 Long term (current) use of aspirin: Secondary | ICD-10-CM | POA: Diagnosis not present

## 2018-03-07 DIAGNOSIS — R0789 Other chest pain: Secondary | ICD-10-CM | POA: Diagnosis not present

## 2018-03-07 DIAGNOSIS — Z9841 Cataract extraction status, right eye: Secondary | ICD-10-CM | POA: Insufficient documentation

## 2018-03-07 DIAGNOSIS — I2510 Atherosclerotic heart disease of native coronary artery without angina pectoris: Secondary | ICD-10-CM | POA: Diagnosis present

## 2018-03-07 DIAGNOSIS — Z803 Family history of malignant neoplasm of breast: Secondary | ICD-10-CM | POA: Insufficient documentation

## 2018-03-07 DIAGNOSIS — R0689 Other abnormalities of breathing: Secondary | ICD-10-CM | POA: Diagnosis not present

## 2018-03-07 DIAGNOSIS — R42 Dizziness and giddiness: Secondary | ICD-10-CM | POA: Diagnosis not present

## 2018-03-07 LAB — CBC
HEMATOCRIT: 37.3 % (ref 36.0–46.0)
Hemoglobin: 13.9 g/dL (ref 12.0–15.0)
MCH: 30.8 pg (ref 26.0–34.0)
MCHC: 37.3 g/dL — ABNORMAL HIGH (ref 30.0–36.0)
MCV: 82.7 fL (ref 80.0–100.0)
Platelets: 335 10*3/uL (ref 150–400)
RBC: 4.51 MIL/uL (ref 3.87–5.11)
RDW: 13.8 % (ref 11.5–15.5)
WBC: 11.5 10*3/uL — ABNORMAL HIGH (ref 4.0–10.5)
nRBC: 0 % (ref 0.0–0.2)

## 2018-03-07 LAB — TROPONIN I
Troponin I: 0.03 ng/mL (ref <0.03)
Troponin I: <0.03 ng/mL (ref <0.03)

## 2018-03-07 LAB — BASIC METABOLIC PANEL
Anion gap: 13 (ref 5–15)
BUN: 12 mg/dL (ref 8–23)
CHLORIDE: 92 mmol/L — AB (ref 98–111)
CO2: 29 mmol/L (ref 22–32)
Calcium: 9.4 mg/dL (ref 8.9–10.3)
Creatinine, Ser: 0.74 mg/dL (ref 0.44–1.00)
GFR calc Af Amer: >60 mL/min (ref >60)
GFR calc non Af Amer: >60 mL/min (ref >60)
Glucose, Bld: 117 mg/dL — ABNORMAL HIGH (ref 70–99)
Potassium: 2.6 mmol/L — CL (ref 3.5–5.1)
Sodium: 134 mmol/L — ABNORMAL LOW (ref 135–145)

## 2018-03-07 LAB — HEPATIC FUNCTION PANEL
ALT: 10 U/L (ref 0–44)
AST: 18 U/L (ref 15–41)
Albumin: 2.3 g/dL — ABNORMAL LOW (ref 3.5–5.0)
Alkaline Phosphatase: 27 U/L — ABNORMAL LOW (ref 38–126)
BILIRUBIN INDIRECT: 1 mg/dL — AB (ref 0.3–0.9)
Bilirubin, Direct: 0.2 mg/dL (ref 0.0–0.2)
Total Bilirubin: 1.2 mg/dL (ref 0.3–1.2)
Total Protein: 4.5 g/dL — ABNORMAL LOW (ref 6.5–8.1)

## 2018-03-07 LAB — LIPASE, BLOOD: LIPASE: 27 U/L (ref 11–51)

## 2018-03-07 MED ORDER — POTASSIUM CHLORIDE CRYS ER 20 MEQ PO TBCR
40.0000 meq | EXTENDED_RELEASE_TABLET | Freq: Once | ORAL | Status: AC
  Administered 2018-03-07: 40 meq via ORAL
  Filled 2018-03-07: qty 2

## 2018-03-07 MED ORDER — MAGNESIUM SULFATE 2 GM/50ML IV SOLN
2.0000 g | Freq: Once | INTRAVENOUS | Status: AC
  Administered 2018-03-07: 2 g via INTRAVENOUS
  Filled 2018-03-07: qty 50

## 2018-03-07 MED ORDER — TOFACITINIB CITRATE 5 MG PO TABS
5.0000 mg | ORAL_TABLET | Freq: Two times a day (BID) | ORAL | Status: DC
  Administered 2018-03-08 – 2018-03-09 (×2): 5 mg via ORAL
  Filled 2018-03-07 (×2): qty 1

## 2018-03-07 MED ORDER — ACETAMINOPHEN 650 MG RE SUPP: 650.0000 mg | Freq: Four times a day (QID) | RECTAL | Status: DC | PRN

## 2018-03-07 MED ORDER — HYDROXYZINE HCL 10 MG PO TABS
10.0000 mg | ORAL_TABLET | Freq: Every day | ORAL | Status: DC
  Administered 2018-03-08 – 2018-03-09 (×2): 10 mg via ORAL
  Filled 2018-03-07 (×2): qty 1

## 2018-03-07 MED ORDER — SODIUM CHLORIDE 0.9 % IV SOLN: Freq: Once | INTRAVENOUS | Status: DC

## 2018-03-07 MED ORDER — ALPRAZOLAM 0.5 MG PO TABS
0.2500 mg | ORAL_TABLET | Freq: Every day | ORAL | Status: DC
  Administered 2018-03-07 – 2018-03-08 (×2): 0.25 mg via ORAL
  Filled 2018-03-07 (×2): qty 1

## 2018-03-07 MED ORDER — SODIUM CHLORIDE 0.9 % IV SOLN
INTRAVENOUS | Status: AC
  Administered 2018-03-07 – 2018-03-08 (×2): via INTRAVENOUS

## 2018-03-07 MED ORDER — INSULIN ASPART 100 UNIT/ML ~~LOC~~ SOLN
0.0000 [IU] | Freq: Three times a day (TID) | SUBCUTANEOUS | Status: DC
  Administered 2018-03-08: 2 [IU] via SUBCUTANEOUS
  Administered 2018-03-08 – 2018-03-09 (×3): 1 [IU] via SUBCUTANEOUS
  Filled 2018-03-07 (×5): qty 1

## 2018-03-07 MED ORDER — PANTOPRAZOLE SODIUM 40 MG PO TBEC
40.0000 mg | DELAYED_RELEASE_TABLET | Freq: Every day | ORAL | Status: DC
  Administered 2018-03-08 – 2018-03-09 (×2): 40 mg via ORAL
  Filled 2018-03-07 (×2): qty 1

## 2018-03-07 MED ORDER — ONDANSETRON HCL 4 MG/2ML IJ SOLN: 4.0000 mg | Freq: Four times a day (QID) | INTRAMUSCULAR | Status: DC | PRN

## 2018-03-07 MED ORDER — POLYVINYL ALCOHOL-POVIDONE 5-6 MG/ML OP SOLN: Freq: Every day | OPHTHALMIC | Status: DC | PRN

## 2018-03-07 MED ORDER — POLYVINYL ALCOHOL 1.4 % OP SOLN
1.0000 [drp] | Freq: Every day | OPHTHALMIC | Status: DC | PRN
  Filled 2018-03-07: qty 15

## 2018-03-07 MED ORDER — ACETAMINOPHEN 325 MG PO TABS
650.0000 mg | ORAL_TABLET | Freq: Four times a day (QID) | ORAL | Status: DC | PRN
  Administered 2018-03-08: 650 mg via ORAL
  Filled 2018-03-07: qty 2

## 2018-03-07 MED ORDER — SODIUM CHLORIDE 0.9 % IV BOLUS
500.0000 mL | Freq: Once | INTRAVENOUS | Status: AC
  Administered 2018-03-07: 500 mL via INTRAVENOUS

## 2018-03-07 MED ORDER — ENOXAPARIN SODIUM 40 MG/0.4ML ~~LOC~~ SOLN
40.0000 mg | SUBCUTANEOUS | Status: DC
  Filled 2018-03-07: qty 0.4

## 2018-03-07 MED ORDER — ONDANSETRON HCL 4 MG PO TABS: 4.0000 mg | ORAL_TABLET | Freq: Four times a day (QID) | ORAL | Status: DC | PRN

## 2018-03-07 MED ORDER — TICAGRELOR 90 MG PO TABS
90.0000 mg | ORAL_TABLET | Freq: Two times a day (BID) | ORAL | Status: DC
  Administered 2018-03-07: 90 mg via ORAL
  Filled 2018-03-07: qty 1

## 2018-03-07 MED ORDER — ATORVASTATIN CALCIUM 20 MG PO TABS
80.0000 mg | ORAL_TABLET | Freq: Every day | ORAL | Status: DC
  Administered 2018-03-08: 80 mg via ORAL
  Filled 2018-03-07: qty 4

## 2018-03-07 MED ORDER — ASPIRIN EC 81 MG PO TBEC
81.0000 mg | DELAYED_RELEASE_TABLET | Freq: Every day | ORAL | Status: DC
  Administered 2018-03-08 – 2018-03-09 (×2): 81 mg via ORAL
  Filled 2018-03-07 (×2): qty 1

## 2018-03-07 NOTE — ED Notes (Signed)
Pt provided meal tray and ginger ale per Dr. Quentin Cornwall.  IV fluids to finish and then orthostatic.

## 2018-03-07 NOTE — ED Provider Notes (Signed)
[facility] Emergency Department Provider Note    First MD Initiated Contact with Patient 03/07/18 1539     (approximate)  I have reviewed the triage vital signs and the nursing notes.   HISTORY  Chief Complaint Loss of Consciousness    HPI Elaine Cox is a 72 y.o. female status post recent admission to the hospital for chest pain status post PCI with 2 stents placed on Brilinta who presents the ER for lightheadedness weakness near syncopal episode where she was unable to stand and slid to the floor.  Is unable to recall whether she hit her head.  Denies any headache at this time.  No numbness or tingling.  Does still feel very weak.  Patient found to be hypotensive in route and orthostatic symptomatically.  Denies any chest pain at this time.    Past Medical History:  Diagnosis Date  . Anginal pain ([place]) 06/2016   being followed by dr. Ubaldo Glassing  . Anxiety   . Arthritis    Osteoarthritis  . Arthritis    Rheumatoid  . Breast cancer ([place]) 2000   Right Breast - Chemotherapy  . 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  . Coronary artery disease   . Diabetes mellitus without complication [facility])    Patient takes Metformin  . Dyspnea 03/2017  . GERD (gastroesophageal reflux disease)   . History of abdominal hysterectomy   . History of eyelid surgery   . History of kidney stones   . Hyperlipidemia   . Hypertension   . Lumbago   . Myocardial infarction ([place]) 07/2010  . Orthopnea 03/2017  . Personal history of chemotherapy 2000   right breast ca  . Sinus disorder   . Stroke [facility]) 2006, 2012  . Wears dentures    full upper and lower   Family History  Problem Relation Age of Onset  . Diabetes type II Mother   . Hypertension Mother   . Colon polyps Mother   . Breast cancer Maternal Aunt    Past Surgical History:  Procedure Laterality Date  . ABDOMINAL HYSTERECTOMY    . BREAST SURGERY Right    mastectomy   . CARDIAC  CATHETERIZATION  2012   stent placed  . CATARACT EXTRACTION W/PHACO Right 06/24/2017   Procedure: CATARACT EXTRACTION PHACO AND INTRAOCULAR LENS PLACEMENT ([place]) COMPLICATED RIGHT DIABETIC;  Surgeon: Leandrew Koyanagi, MD;  Location: [place];  Service: Ophthalmology;  Laterality: Right;  Diabetes - oral med  . COLONOSCOPY    . COLONOSCOPY WITH PROPOFOL N/A 12/08/2017   Procedure: COLONOSCOPY WITH PROPOFOL;  Surgeon: Toledo, Benay Pike, MD;  Location: [facility] ENDOSCOPY;  Service: Gastroenterology;  Laterality: N/A;  . CORONARY ANGIOPLASTY    . CORONARY STENT INTERVENTION N/A 02/24/2018   Procedure: CORONARY STENT INTERVENTION;  Surgeon: [name], MD;  Location: [place] CV LAB;  Service: Cardiovascular;  Laterality: N/A;  . CYSTOSCOPY N/A 09/08/2016   Procedure: CYSTOSCOPY;  Surgeon: Ward, Honor Loh, MD;  Location: [facility] ORS;  Service: Gynecology;  Laterality: N/A;  . EYE SURGERY Left    Cataract Extraction with IOL  . KNEE ARTHROSCOPY W/ MENISCAL REPAIR Right   . LAPAROSCOPIC BILATERAL SALPINGO OOPHERECTOMY Bilateral 09/08/2016   Procedure: LAPAROSCOPIC BILATERAL SALPINGO OOPHORECTOMY;  Surgeon: Ward, Honor Loh, MD;  Location: [facility] OR[facility];  Service: Gynecology;  Laterality: Bilateral;  . LEFT HEART CATH AND CORONARY ANGIOGRAPHY Left 02/24/2018   Procedure: LEFT HEART CATH AND CORONARY ANGIOGRAPHY;  Surgeon: [name], MD;  Location: [place] CV LAB;  Service: Cardiovascular;  Laterality: Left;  Marland Kitchen MASTECTOMY Right 2000   BREAST CA   Patient Active Problem List   Diagnosis Date Noted  . NSTEMI (non-ST elevated myocardial infarction) ([place]) 02/22/2018  . Anxiety 02/22/2018  . Diabetes ([place]) 02/22/2018  . HTN (hypertension) 02/22/2018  . GERD (gastroesophageal reflux disease) 02/22/2018  . CAD (coronary artery disease) 02/22/2018  . HLD (hyperlipidemia) 02/22/2018      Prior to Admission medications   Medication Sig Start Date End Date Taking?  Authorizing Provider  ALPRAZolam (XANAX) 0.25 MG tablet Take 0.25 mg by mouth at bedtime.    Yes [provider]  amLODipine (NORVASC) 5 MG tablet Take 5 mg by mouth 2 (two) times daily.    Yes [provider]  aspirin EC 81 MG tablet Take 81 mg by mouth daily.   Yes [provider]  atorvastatin (LIPITOR) 80 MG tablet Take 1 tablet (80 mg total) by mouth daily at 6 PM. 02/25/18 04/26/18 Yes Sainani, Belia Heman, MD  Biotin 800 MCG TABS Take 1 tablet by mouth daily.    Yes [provider]  bisacodyl (DULCOLAX) 5 MG EC tablet Take 5 mg by mouth daily as needed for moderate constipation.   Yes [provider]  diphenhydrAMINE (BENADRYL) 25 MG tablet Take 25 mg by mouth daily as needed for allergies.   Yes [provider]  hydrochlorothiazide (HYDRODIURIL) 25 MG tablet Take 25 mg by mouth daily.    Yes [provider]  hydrOXYzine (ATARAX/VISTARIL) 10 MG tablet Take 10 mg by mouth daily.    Yes [provider]  ibuprofen (ADVIL,MOTRIN) 200 MG tablet Take 600 mg by mouth 2 (two) times daily as needed for moderate pain.   Yes [provider]  Menthol-Methyl Salicylate (MUSCLE RUB) 10-15 % CREA Apply 1 application topically as needed for muscle pain.   Yes [provider]  metFORMIN (GLUCOPHAGE) 500 MG tablet Take 500 mg by mouth 2 (two) times daily with a meal.   Yes [provider]  metoprolol succinate (TOPROL-XL) 100 MG 24 hr tablet Take 100 mg by mouth every evening.    Yes [provider]  mupirocin ointment (BACTROBAN) 2 % Apply 1 application topically 3 (three) times daily. 03/03/18  Yes [provider]  nitroGLYCERIN (NITROSTAT) 0.4 MG SL tablet Place 0.4 mg under the tongue every 5 (five) minutes as needed for chest pain.    Yes [provider]  omeprazole (PRILOSEC) 20 MG capsule Take 20 mg by mouth daily.   Yes [provider]  Polyvinyl Alcohol-Povidone (MURINE  TEARS FOR DRY EYES OP) Apply 1 drop to eye daily as needed (dry eyes).   Yes [provider]  potassium chloride (K-DUR,KLOR-CON) 10 MEQ tablet Take 10 mEq by mouth 2 (two) times daily.    Yes [provider]  ticagrelor (BRILINTA) 90 MG TABS tablet Take 1 tablet (90 mg total) by mouth 2 (two) times daily. 02/25/18 04/26/18 Yes Sainani, Belia Heman, MD  Tofacitinib Citrate (XELJANZ) 5 MG TABS Take 5 mg by mouth 2 (two) times daily.    Yes [provider]    Allergies Doxazosin; Fish allergy; Dicloxacillin; Iodine; Sulfa antibiotics; Bactrim [sulfamethoxazole-trimethoprim]; Beta adrenergic blockers; Cardura [doxazosin mesylate]; Etodolac; Azithromycin; Clopidogrel; Colchicine; Glucosamine; Leflunomide; Meloxicam; Methotrexate; and Zoloft [sertraline]    Social History Social History   Tobacco Use  . Smoking status: Never Smoker  . Smokeless tobacco: Never Used  Substance Use Topics  .  Alcohol use: No  . Drug use: No    Review of Systems Patient denies headaches, rhinorrhea, blurry vision, numbness, shortness of breath, chest pain, edema, cough, abdominal pain, nausea, vomiting, diarrhea, dysuria, fevers, rashes or hallucinations unless otherwise stated above in HPI. ____________________________________________   PHYSICAL EXAM:  VITAL SIGNS: Vitals:   03/07/18 1830 03/07/18 1900  BP: 136/66 127/65  Pulse: 73 72  Resp: 20 (!) 23  Temp:    SpO2: 96% 96%    Constitutional: Alert and oriented.  Eyes: Conjunctivae are normal.  Head: Atraumatic. Nose: No congestion/rhinnorhea. Mouth/Throat: Mucous membranes are moist.   Neck: No stridor. Painless ROM.  Cardiovascular: Normal rate, regular rhythm. Grossly normal heart sounds.  Good peripheral circulation. Respiratory: Normal respiratory effort.  No retractions. Lungs CTAB. Gastrointestinal: Soft and nontender. No distention. No abdominal bruits. No CVA tenderness. Genitourinary:  Musculoskeletal: No lower  extremity tenderness nor edema.  No joint effusions. Neurologic:  Normal speech and language. No gross focal neurologic deficits are appreciated. No facial droop Skin:  Skin is warm, dry and intact. No rash noted. Psychiatric: Mood and affect are normal. Speech and behavior are normal.  ____________________________________________   LABS (all labs ordered are listed, but only abnormal results are displayed)  Results for orders placed or performed during the hospital encounter of 03/07/18 (from the past 24 hour(s))  Basic metabolic panel     Status: Abnormal   Collection Time: 03/07/18  3:43 PM  Result Value Ref Range   Sodium 134 (L) 135 - 145 mmol/L   Potassium 2.6 (LL) 3.5 - 5.1 mmol/L   Chloride 92 (L) 98 - 111 mmol/L   CO2 29 22 - 32 mmol/L   Glucose, Bld 117 (H) 70 - 99 mg/dL   BUN 12 8 - 23 mg/dL   Creatinine, Ser 0.74 0.44 - 1.00 mg/dL   Calcium 9.4 8.9 - 10.3 mg/dL   GFR calc non Af Amer >60 >60 mL/min   GFR calc Af Amer >60 >60 mL/min   Anion gap 13 5 - 15  CBC     Status: Abnormal   Collection Time: 03/07/18  3:43 PM  Result Value Ref Range   WBC 11.5 (H) 4.0 - 10.5 K/uL   RBC 4.51 3.87 - 5.11 MIL/uL   Hemoglobin 13.9 12.0 - 15.0 g/dL   HCT 37.3 36.0 - 46.0 %   MCV 82.7 80.0 - 100.0 fL   MCH 30.8 26.0 - 34.0 pg   MCHC 37.3 (H) 30.0 - 36.0 g/dL   RDW 13.8 11.5 - 15.5 %   Platelets 335 150 - 400 K/uL   nRBC 0.0 0.0 - 0.2 %  Troponin I - Add-On to previous collection     Status: Abnormal   Collection Time: 03/07/18  3:43 PM  Result Value Ref Range   Troponin I 0.03 (HH) <0.03 ng/mL  Troponin I - Once-Timed     Status: None   Collection Time: 03/07/18  6:42 PM  Result Value Ref Range   Troponin I <0.03 <0.03 ng/mL   ____________________________________________  EKG My review and personal interpretation at Time: 15:37   Indication: weakness  Rate: 70  Rhythm: sinus Axis: normal Other: anterior t wave abn, no  stemi ____________________________________________  RADIOLOGY  I personally reviewed all radiographic images ordered to evaluate for the above acute complaints and reviewed radiology reports and findings.  These findings were personally discussed with the patient.  Please see medical record for radiology report.  ____________________________________________   PROCEDURES  Procedure(s) performed:  Procedures    Critical Care performed: no ____________________________________________   INITIAL IMPRESSION / ASSESSMENT AND PLAN / ED COURSE  Pertinent labs & imaging results that were available during my care of the patient were reviewed by me and considered in my medical decision making (see chart for details).   DDX: dehydration, orthostasis, dysrhythmia, contusion, sah, sdh, iph, hematoma, chf, med effect  Elaine Cox is a 72 y.o. who presents to the ED with sx as described above.  Patient is orthostatic.  Non toxic appearing but with complex recent pmh.  The patient will be placed on continuous pulse oximetry and telemetry for monitoring.  Laboratory evaluation will be sent to evaluate for the above complaints.    CT head will be ordered 2/2 concern for tbi on blood thinners.  Clinical Course as of Mar 08 2055  Nancy Fetter Mar 07, 2018  1636 Patient denies any hematochezia or melena.  Denies any chest pain at this time.  States that she did take 1 nitro yesterday.   [PR]  1823 Patient states that she has not eaten anything today.  May contribute to her orthostasis.  Again denies any chest pain at this time.   [PR]  1927 Patient persistently orthostatic.  Given her recent hospitalization with cardiac cath medication changes and persistent symptom medic orthostasis I do believe it is in her best interest that she be admitted to the hospital for telemetry monitoring IV fluids and reevaluation.  She is also complaining of right flank pain as she is anticoagulated recently status post right  Pham PCI will order CT abdomen to exclude retroperitoneal hematoma or bleed causing the patient's orthostasis hypotension.   [PR]  2041 No evidence of hematoma.  Findings possibly related to pancreatitis.  Have a lower suspicion for this but will add on LFTs and lipase.   [PR]    Clinical Course User Index [PR] Merlyn Lot, MD     As part of my medical decision making, I reviewed the following data within the [place] notes reviewed and incorporated, Labs reviewed, notes from prior ED visits.   ____________________________________________   FINAL CLINICAL IMPRESSION(S) / ED DIAGNOSES  Final diagnoses:  Syncope and collapse  Orthostatic hypotension      NEW MEDICATIONS STARTED DURING THIS VISIT:  New Prescriptions   No medications on file     Note:  This document was prepared using Dragon voice recognition software and may include unintentional dictation errors.    Merlyn Lot, MD 03/07/18 2056

## 2018-03-07 NOTE — ED Notes (Signed)
PT ambulatory to BR with 1 person assist.  Pt states she feels better but is still "woozy".  Pt states she has not eaten recently and believes that may be part of her "wooziness".  Will discuss with Dr. Quentin Cornwall.

## 2018-03-07 NOTE — ED Notes (Signed)
Mateo Flow RN sent a blue top to lab.

## 2018-03-07 NOTE — ED Notes (Signed)
Date and time results received: 03/07/18 4:20 PM    Test: Potassium Critical Value: 2.6  Name of Provider Notified: Quentin Cornwall

## 2018-03-07 NOTE — ED Notes (Signed)
Pt taken to CT via stretcher.

## 2018-03-07 NOTE — ED Notes (Addendum)
PUG security word for pt

## 2018-03-07 NOTE — ED Notes (Signed)
[facility] 121

## 2018-03-07 NOTE — ED Notes (Signed)
Date and time results received: 03/07/18 5:40 PM    Test: Troponin Critical Value: 0.03  Name of Provider Notified: Quentin Cornwall, [place]

## 2018-03-07 NOTE — ED Notes (Signed)
ED TO INPATIENT HANDOFF REPORT  Name/Age/Gender Elaine Cox 72 y.o. female  Code Status Code Status History    Date Active Date Inactive Code Status Order ID Comments User Context   02/24/2018 1106 02/25/2018 1451 Full Code [id number]  [name], MD Inpatient   02/22/2018 2227 02/24/2018 1106 Full Code [id number]  Vaughan Basta, MD Inpatient      Home/SNF/Other home  Chief Complaint syncope  Level of Care/Admitting Diagnosis ED Disposition    ED Disposition Condition [place]: [place] [100120]  Level of Care: Telemetry [5]  Diagnosis: Near syncope [phone]  Admitting Physician: Lance Coon [[id number]]  Attending Physician: Lance Coon [phone]  Bed request comments: 2a  PT Class (Do Not Modify): Observation [104]  PT Acc Code (Do Not Modify): Observation [10022]       Medical History Past Medical History:  Diagnosis Date  . Anginal pain ([place]) 06/2016   being followed by dr. Ubaldo Glassing  . Anxiety   . Arthritis    Osteoarthritis  . Arthritis    Rheumatoid  . Breast cancer ([place]) 2000   Right Breast - Chemotherapy  . 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  . Coronary artery disease   . Diabetes mellitus without complication [facility])    Patient takes Metformin  . Dyspnea 03/2017  . GERD (gastroesophageal reflux disease)   . History of abdominal hysterectomy   . History of eyelid surgery   . History of kidney stones   . Hyperlipidemia   . Hypertension   . Lumbago   . Myocardial infarction ([place]) 07/2010  . Orthopnea 03/2017  . Personal history of chemotherapy 2000   right breast ca  . Sinus disorder   . Stroke [facility]) 2006, 2012  . Wears dentures    full upper and lower    Allergies Allergies  Allergen Reactions  . Doxazosin Other (See Comments)    "headache"  . Fish Allergy Anaphylaxis, Itching, Nausea Only and Swelling    She states her tongue turns black.  .  Dicloxacillin Nausea And Vomiting    Has patient had a PCN reaction causing immediate rash, facial/tongue/throat swelling, SOB or lightheadedness with hypotension: No Has patient had a PCN reaction causing severe rash involving mucus membranes or skin necrosis: No Has patient had a PCN reaction that required hospitalization: Unknown Has patient had a PCN reaction occurring within the last 10 years: Unknown If all of the above answers are "NO", then may proceed with Cephalosporin use.   . Iodine Hives    Per patient, she had a previous reaction/hives from an iodine injection  . Sulfa Antibiotics Swelling    Loss of appetite   Constipation bloating  . Bactrim [Sulfamethoxazole-Trimethoprim] Nausea Only  . Beta Adrenergic Blockers Other (See Comments)    "Unknown"  . Cardura [Doxazosin Mesylate]     Headache   . Etodolac Itching and Swelling  . Azithromycin Itching  . Clopidogrel Nausea And Vomiting  . Colchicine Nausea Only and Other (See Comments)    Dizzness  . Glucosamine Nausea And Vomiting  . Leflunomide Diarrhea and Itching  . Meloxicam Itching  . Methotrexate Nausea Only  . Zoloft [Sertraline] Nausea And Vomiting    IV Location/Drains/Wounds Patient Lines/Drains/Airways Status   Active Line/Drains/Airways    Name:   Placement date:   Placement time:   Site:   Days:   Peripheral IV 03/07/18 Left Hand   03/07/18    1542  Hand   less than 1          Labs/Imaging Results for orders placed or performed during the hospital encounter of 03/07/18 (from the past 48 hour(s))  Basic metabolic panel     Status: Abnormal   Collection Time: 03/07/18  3:43 PM  Result Value Ref Range   Sodium 134 (L) 135 - 145 mmol/L   Potassium 2.6 (LL) 3.5 - 5.1 mmol/L    Comment: CRITICAL RESULT CALLED TO, READ BACK BY AND VERIFIED WITH Evanna Washinton WHITTLEY AT 1620 ON 03/07/18 BY SNJ    Chloride 92 (L) 98 - 111 mmol/L   CO2 29 22 - 32 mmol/L   Glucose, Bld 117 (H) 70 - 99 mg/dL   BUN 12 8 -  23 mg/dL   Creatinine, Ser 0.74 0.44 - 1.00 mg/dL   Calcium 9.4 8.9 - 10.3 mg/dL   GFR calc non Af Amer >60 >60 mL/min   GFR calc Af Amer >60 >60 mL/min   Anion gap 13 5 - 15    Comment: Performed at [facility], [address] [place], [place] [address]  CBC     Status: Abnormal   Collection Time: 03/07/18  3:43 PM  Result Value Ref Range   WBC 11.5 (H) 4.0 - 10.5 K/uL   RBC 4.51 3.87 - 5.11 MIL/uL   Hemoglobin 13.9 12.0 - 15.0 g/dL   HCT 37.3 36.0 - 46.0 %   MCV 82.7 80.0 - 100.0 fL   MCH 30.8 26.0 - 34.0 pg   MCHC 37.3 (H) 30.0 - 36.0 g/dL   RDW 13.8 11.5 - 15.5 %   Platelets 335 150 - 400 K/uL   nRBC 0.0 0.0 - 0.2 %    Comment: Performed at [facility], [address] [place], [place] [address]  Troponin I - Add-On to previous collection     Status: Abnormal   Collection Time: 03/07/18  3:43 PM  Result Value Ref Range   Troponin I 0.03 (HH) <0.03 ng/mL    Comment: CRITICAL RESULT CALLED TO, READ BACK BY AND VERIFIED WITH Mariaguadalupe Fialkowski WHITLEY AT 1740 03/07/18.PMF Performed at [facility], [address] [place], [place] [address]   Troponin I - Once-Timed     Status: None   Collection Time: 03/07/18  6:42 PM  Result Value Ref Range   Troponin I <0.03 <0.03 ng/mL    Comment: Performed at [facility], [address] [place], [place] [address]  Lipase, blood     Status: None   Collection Time: 03/07/18  6:42 PM  Result Value Ref Range   Lipase 27 11 - 51 U/L    Comment: Performed at [facility], [address] [place], [place] [address]  Hepatic function panel     Status: Abnormal   Collection Time: 03/07/18  6:42 PM  Result Value Ref Range   Total Protein 4.5 (L) 6.5 - 8.1 g/dL   Albumin 2.3 (L) 3.5 - 5.0 g/dL   AST 18 15 - 41 U/L   ALT 10 0 - 44 U/L   Alkaline Phosphatase 27 (L) 38 - 126 U/L   Total Bilirubin 1.2 0.3 - 1.2 mg/dL   Bilirubin, Direct 0.2 0.0 - 0.2 mg/dL   Indirect Bilirubin 1.0 (H) 0.3 - 0.9 mg/dL     Comment: Performed at [facility], [address] [place], [place] [address]   Ct Abdomen Pelvis Wo Contrast  Result Date: 03/07/2018 CLINICAL DATA:  Right  flank pain. Patient on anticoagulation. Evaluate for retroperitoneal bleed. Patient with contrast allergy. EXAM: CT ABDOMEN AND PELVIS WITHOUT CONTRAST TECHNIQUE: Multidetector CT imaging of the abdomen and pelvis was performed following the standard protocol without IV contrast. COMPARISON:  01/02/2015 FINDINGS: Lower chest: Lung bases are normal. Hepatobiliary: Gallbladder, liver and biliary tree are normal. Pancreas: There is subtle ill definition of the peripancreatic fat planes adjacent the head and body of the pancreas. Findings may be seen and mild acute pancreatitis. Spleen: Normal. Adrenals/Urinary Tract: Adrenal glands are normal. Kidneys are normal in size with mild right-sided nephrolithiasis. No significant hydronephrosis. Ureters and bladder are normal. Stomach/Bowel: Stomach and small bowel are normal. Appendix is not visualized. Mild colonic diverticulosis. Vascular/Lymphatic: Mild calcified plaque over the abdominal aorta and iliac arteries. No significant adenopathy. Reproductive: Previous hysterectomy. Other: Subtle hazy appearance to the mid mesentery. Small amount of free fluid over the right mid abdomen. Subtle stranding of the fat in the right inguinal region. Musculoskeletal: Mild degenerative change of the spine. Mild to moderate compression fracture of L4 age indeterminate but new compared to 2016. IMPRESSION: Subtle changes as described which may be due to mild acute pancreatitis. Right-sided nephrolithiasis. No evidence of ureteral stones or obstruction. Colonic diverticulosis. Aortic Atherosclerosis (ICD10-I70.0). Moderate L4 compression fracture age indeterminate but new since 2016. Electronically Signed   By: Marin Olp M.D.   On: 03/07/2018 20:28   Dg Chest 2 View  Result Date: 03/07/2018 CLINICAL DATA:   Syncope. EXAM: CHEST - 2 VIEW COMPARISON:  None. FINDINGS: The heart size and mediastinal contours are within normal limits. Both lungs are clear. The visualized skeletal structures are unremarkable. IMPRESSION: No active cardiopulmonary disease. Electronically Signed   By: Dorise Bullion III M.D   On: 03/07/2018 17:02   Ct Head Wo Contrast  Result Date: 03/07/2018 CLINICAL DATA:  Syncopal episode today. EXAM: CT HEAD WITHOUT CONTRAST TECHNIQUE: Contiguous axial images were obtained from the base of the skull through the vertex without intravenous contrast. COMPARISON:  Brain MRI and head CT 05/03/2006. FINDINGS: Brain: No evidence of acute infarction, hemorrhage, hydrocephalus, extra-axial collection or mass lesion/mass effect. Vascular: No hyperdense vessel or unexpected calcification. Skull: Normal. Negative for fracture or focal lesion. Sinuses/Orbits: Negative. Other: None. IMPRESSION: Negative head CT. Electronically Signed   By: Inge Rise M.D.   On: 03/07/2018 16:20    Pending Labs Unresulted Labs (From admission, onward)    Start     Ordered   03/07/18 1541  Urinalysis, Complete w Microscopic  ONCE - STAT,   STAT     03/07/18 1540   Signed and Held  CBC  (enoxaparin (LOVENOX)    CrCl >/= 30 ml/min)  Once,   R    Comments:  Baseline for enoxaparin therapy IF NOT ALREADY DRAWN.  Notify MD if PLT < 100 K.    Signed and Held   Signed and Held  Creatinine, serum  (enoxaparin (LOVENOX)    CrCl >/= 30 ml/min)  Once,   R    Comments:  Baseline for enoxaparin therapy IF NOT ALREADY DRAWN.    Signed and Held   Signed and Held  Creatinine, serum  (enoxaparin (LOVENOX)    CrCl >/= 30 ml/min)  Weekly,   R    Comments:  while on enoxaparin therapy    Signed and Held   Signed and Held  Basic metabolic panel  Tomorrow morning,   R     Signed and Held   Signed and Held  CBC  Tomorrow morning,   R     Signed and Held          Vitals/Pain Today's Vitals   03/07/18 1730 03/07/18 1800  03/07/18 1830 03/07/18 1900  BP: 139/70 137/65 136/66 127/65  Pulse: 74 72 73 72  Resp: 15 15 20  (!) 23  Temp:      TempSrc:      SpO2: 95% 95% 96% 96%  Weight:      Height:      PainSc:        Isolation Precautions No active isolations  Medications Medications  potassium chloride SA (K-DUR,KLOR-CON) CR tablet 40 mEq (40 mEq Oral Given 03/07/18 1653)  magnesium sulfate IVPB 2 g 50 mL (0 g Intravenous Stopped 03/07/18 1813)  sodium chloride 0.9 % bolus 500 mL (0 mLs Intravenous Stopped 03/07/18 1917)  0.9 %  sodium chloride infusion ( Intravenous New Bag/Given 03/07/18 1934)    Mobility With stand by assist

## 2018-03-07 NOTE — H&P (Signed)
[name] NAME: Elaine Cox    MR#:  [id number]  DATE OF BIRTH:  1945-06-04  DATE OF ADMISSION:  03/07/2018  PRIMARY CARE PHYSICIAN: Elaine Harrier, MD   REQUESTING/REFERRING PHYSICIAN: Quentin Cornwall, MD  CHIEF COMPLAINT:   Chief Complaint  Patient presents with  . Loss of Consciousness    HISTORY OF PRESENT ILLNESS:  Elaine Cox  is a 72 y.o. female who presents with chief complaint as above.  She presents after syncopal episode.  She was recently here in the hospital with an STEMI and had 2 stents placed during cardiac cath.  She states that for the past several days she has had decreased energy and decreased appetite.  She thought that she was having dizziness when she stood up because she was not eating enough.  Today in her kitchen she was standing and had a syncopal episode.  In the ED work-up reveals largely normal labs, but with orthostatic hypotension.  Hospitalist were called for further evaluation  PAST MEDICAL HISTORY:   Past Medical History:  Diagnosis Date  . Anginal pain ([place]) 06/2016   being followed by dr. Ubaldo Glassing  . Anxiety   . Arthritis    Osteoarthritis  . Arthritis    Rheumatoid  . Breast cancer ([place]) 2000   Right Breast - Chemotherapy  . 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  . Coronary artery disease   . Diabetes mellitus without complication [facility])    Patient takes Metformin  . Dyspnea 03/2017  . GERD (gastroesophageal reflux disease)   . History of abdominal hysterectomy   . History of eyelid surgery   . History of kidney stones   . Hyperlipidemia   . Hypertension   . Lumbago   . Myocardial infarction ([place]) 07/2010  . Orthopnea 03/2017  . Personal history of chemotherapy 2000   right breast ca  . Sinus disorder   . Stroke [facility]) 2006, 2012  . Wears dentures    full upper and lower     PAST SURGICAL HISTORY:   Past Surgical History:  Procedure  Laterality Date  . ABDOMINAL HYSTERECTOMY    . BREAST SURGERY Right    mastectomy   . CARDIAC CATHETERIZATION  2012   stent placed  . CATARACT EXTRACTION W/PHACO Right 06/24/2017   Procedure: CATARACT EXTRACTION PHACO AND INTRAOCULAR LENS PLACEMENT ([place]) COMPLICATED RIGHT DIABETIC;  Surgeon: Leandrew Koyanagi, MD;  Location: [facility];  Service: Ophthalmology;  Laterality: Right;  Diabetes - oral med  . COLONOSCOPY    . COLONOSCOPY WITH PROPOFOL N/A 12/08/2017   Procedure: COLONOSCOPY WITH PROPOFOL;  Surgeon: Toledo, Benay Pike, MD;  Location: [facility] ENDOSCOPY;  Service: Gastroenterology;  Laterality: N/A;  . CORONARY ANGIOPLASTY    . CORONARY STENT INTERVENTION N/A 02/24/2018   Procedure: CORONARY STENT INTERVENTION;  Surgeon: [name], MD;  Location: [place] [facility];  Service: Cardiovascular;  Laterality: N/A;  . CYSTOSCOPY N/A 09/08/2016   Procedure: CYSTOSCOPY;  Surgeon: Ward, Honor Loh, MD;  Location: [facility] ORS;  Service: Gynecology;  Laterality: N/A;  . EYE SURGERY Left    Cataract Extraction with IOL  . KNEE ARTHROSCOPY W/ MENISCAL REPAIR Right   . LAPAROSCOPIC BILATERAL SALPINGO OOPHERECTOMY Bilateral 09/08/2016   Procedure: LAPAROSCOPIC BILATERAL SALPINGO OOPHORECTOMY;  Surgeon: Ward, Honor Loh, MD;  Location: [facility] ORS;  Service: Gynecology;  Laterality: Bilateral;  . LEFT HEART CATH AND CORONARY ANGIOGRAPHY Left 02/24/2018   Procedure: LEFT HEART CATH AND  CORONARY ANGIOGRAPHY;  Surgeon: [name], MD;  Location: [place] [facility];  Service: Cardiovascular;  Laterality: Left;  Marland Kitchen MASTECTOMY Right 2000   BREAST CA     SOCIAL HISTORY:   Social History   Tobacco Use  . Smoking status: Never Smoker  . Smokeless tobacco: Never Used  Substance Use Topics  . Alcohol use: No     FAMILY HISTORY:   Family History  Problem Relation Age of Onset  . Diabetes type II Mother   . Hypertension Mother   . Colon polyps Mother   . Breast cancer  Maternal Aunt      DRUG ALLERGIES:   Allergies  Allergen Reactions  . Doxazosin Other (See Comments)    "headache"  . Fish Allergy Anaphylaxis, Itching, Nausea Only and Swelling    She states her tongue turns black.  . Dicloxacillin Nausea And Vomiting    Has patient had a PCN reaction causing immediate rash, facial/tongue/throat swelling, SOB or lightheadedness with hypotension: No Has patient had a PCN reaction causing severe rash involving mucus membranes or skin necrosis: No Has patient had a PCN reaction that required hospitalization: Unknown Has patient had a PCN reaction occurring within the last 10 years: Unknown If all of the above answers are "NO", then may proceed with Cephalosporin use.   . Iodine Hives    Per patient, she had a previous reaction/hives from an iodine injection  . Sulfa Antibiotics Swelling    Loss of appetite   Constipation bloating  . Bactrim [Sulfamethoxazole-Trimethoprim] Nausea Only  . Beta Adrenergic Blockers Other (See Comments)    "Unknown"  . Cardura [Doxazosin Mesylate]     Headache   . Etodolac Itching and Swelling  . Azithromycin Itching  . Clopidogrel Nausea And Vomiting  . Colchicine Nausea Only and Other (See Comments)    Dizzness  . Glucosamine Nausea And Vomiting  . Leflunomide Diarrhea and Itching  . Meloxicam Itching  . Methotrexate Nausea Only  . Zoloft [Sertraline] Nausea And Vomiting    MEDICATIONS AT HOME:   Prior to Admission medications   Medication Sig Start Date End Date Taking? Authorizing Provider  ALPRAZolam (XANAX) 0.25 MG tablet Take 0.25 mg by mouth at bedtime.    Yes [provider]  amLODipine (NORVASC) 5 MG tablet Take 5 mg by mouth 2 (two) times daily.    Yes [provider]  aspirin EC 81 MG tablet Take 81 mg by mouth daily.   Yes [provider]  atorvastatin (LIPITOR) 80 MG tablet Take 1 tablet (80 mg total) by mouth daily at 6 PM. 02/25/18 04/26/18 Yes Sainani, Belia Heman, MD   Biotin 800 MCG TABS Take 1 tablet by mouth daily.    Yes [provider]  bisacodyl (DULCOLAX) 5 MG EC tablet Take 5 mg by mouth daily as needed for moderate constipation.   Yes [provider]  diphenhydrAMINE (BENADRYL) 25 MG tablet Take 25 mg by mouth daily as needed for allergies.   Yes [provider]  hydrochlorothiazide (HYDRODIURIL) 25 MG tablet Take 25 mg by mouth daily.    Yes [provider]  hydrOXYzine (ATARAX/VISTARIL) 10 MG tablet Take 10 mg by mouth daily.    Yes [provider]  ibuprofen (ADVIL,MOTRIN) 200 MG tablet Take 600 mg by mouth 2 (two) times daily as needed for moderate pain.   Yes [provider]  Menthol-Methyl Salicylate (MUSCLE RUB) 10-15 % CREA Apply 1 application topically as needed for muscle pain.  Yes [provider]  metFORMIN (GLUCOPHAGE) 500 MG tablet Take 500 mg by mouth 2 (two) times daily with a meal.   Yes [provider]  metoprolol succinate (TOPROL-XL) 100 MG 24 hr tablet Take 100 mg by mouth every evening.    Yes [provider]  mupirocin ointment (BACTROBAN) 2 % Apply 1 application topically 3 (three) times daily. 03/03/18  Yes [provider]  nitroGLYCERIN (NITROSTAT) 0.4 MG SL tablet Place 0.4 mg under the tongue every 5 (five) minutes as needed for chest pain.    Yes [provider]  omeprazole (PRILOSEC) 20 MG capsule Take 20 mg by mouth daily.   Yes [provider]  Polyvinyl Alcohol-Povidone (MURINE TEARS FOR DRY EYES OP) Apply 1 drop to eye daily as needed (dry eyes).   Yes [provider]  potassium chloride (K-DUR,KLOR-CON) 10 MEQ tablet Take 10 mEq by mouth 2 (two) times daily.    Yes [provider]  ticagrelor (BRILINTA) 90 MG TABS tablet Take 1 tablet (90 mg total) by mouth 2 (two) times daily. 02/25/18 04/26/18 Yes Sainani, Belia Heman, MD  Tofacitinib Citrate (XELJANZ) 5 MG TABS Take 5 mg by mouth 2 (two) times  daily.    Yes [provider]    REVIEW OF SYSTEMS:  Review of Systems  Constitutional: Positive for malaise/fatigue. Negative for chills, fever and weight loss.  HENT: Negative for ear pain, hearing loss and tinnitus.   Eyes: Negative for blurred vision, double vision, pain and redness.  Respiratory: Negative for cough, hemoptysis and shortness of breath.   Cardiovascular: Negative for chest pain, palpitations, orthopnea and leg swelling.  Gastrointestinal: Negative for abdominal pain, constipation, diarrhea, nausea and vomiting.  Genitourinary: Negative for dysuria, frequency and hematuria.  Musculoskeletal: Negative for back pain, joint pain and neck pain.  Skin:       No acne, rash, or lesions  Neurological: Positive for loss of consciousness. Negative for dizziness, tremors, focal weakness and weakness.  Endo/Heme/Allergies: Negative for polydipsia. Does not bruise/bleed easily.  Psychiatric/Behavioral: Negative for depression. The patient is not nervous/anxious and does not have insomnia.      VITAL SIGNS:   Vitals:   03/07/18 1730 03/07/18 1800 03/07/18 1830 03/07/18 1900  BP: 139/70 137/65 136/66 127/65  Pulse: 74 72 73 72  Resp: 15 15 20  (!) 23  Temp:      TempSrc:      SpO2: 95% 95% 96% 96%  Weight:      Height:       Wt Readings from Last 3 Encounters:  03/07/18 81.2 kg  02/24/18 81.2 kg  12/08/17 83.9 kg    PHYSICAL EXAMINATION:  Physical Exam  Vitals reviewed. Constitutional: She is oriented to person, place, and time. She appears well-developed and well-nourished. No distress.  HENT:  Head: Normocephalic and atraumatic.  Dry mucous membranes  Eyes: Pupils are equal, round, and reactive to light. Conjunctivae and EOM are normal. No scleral icterus.  Neck: Normal range of motion. Neck supple. No JVD present. No thyromegaly present.  Cardiovascular: Normal rate, regular rhythm and intact distal pulses. Exam reveals no gallop and no friction rub.   No murmur heard. Respiratory: Effort normal and breath sounds normal. No respiratory distress. She has no wheezes. She has no rales.  GI: Soft. Bowel sounds are normal. She exhibits no distension. There is no tenderness.  Musculoskeletal: Normal range of motion. She exhibits no edema.  No arthritis, no gout  Lymphadenopathy:    She  has no cervical adenopathy.  Neurological: She is alert and oriented to person, place, and time. No cranial nerve deficit.  No dysarthria, no aphasia  Skin: Skin is warm and dry. No rash noted. No erythema.  Psychiatric: She has a normal mood and affect. Her behavior is normal. Judgment and thought content normal.    LABORATORY PANEL:   CBC Recent Labs  Lab 03/07/18 1543  WBC 11.5*  HGB 13.9  HCT 37.3  PLT 335   ------------------------------------------------------------------------------------------------------------------  Chemistries  Recent Labs  Lab 03/07/18 1543 03/07/18 1842  NA 134*  --   K 2.6*  --   CL 92*  --   CO2 29  --   GLUCOSE 117*  --   BUN 12  --   CREATININE 0.74  --   CALCIUM 9.4  --   AST  --  18  ALT  --  10  ALKPHOS  --  27*  BILITOT  --  1.2   ------------------------------------------------------------------------------------------------------------------  Cardiac Enzymes Recent Labs  Lab 03/07/18 1842  TROPONINI <0.03   ------------------------------------------------------------------------------------------------------------------  RADIOLOGY:  Ct Abdomen Pelvis Wo Contrast  Result Date: 03/07/2018 CLINICAL DATA:  Right flank pain. Patient on anticoagulation. Evaluate for retroperitoneal bleed. Patient with contrast allergy. EXAM: CT ABDOMEN AND PELVIS WITHOUT CONTRAST TECHNIQUE: Multidetector CT imaging of the abdomen and pelvis was performed following the standard protocol without IV contrast. COMPARISON:  01/02/2015 FINDINGS: Lower chest: Lung bases are normal. Hepatobiliary: Gallbladder, liver and  biliary tree are normal. Pancreas: There is subtle ill definition of the peripancreatic fat planes adjacent the head and body of the pancreas. Findings may be seen and mild acute pancreatitis. Spleen: Normal. Adrenals/Urinary Tract: Adrenal glands are normal. Kidneys are normal in size with mild right-sided nephrolithiasis. No significant hydronephrosis. Ureters and bladder are normal. Stomach/Bowel: Stomach and small bowel are normal. Appendix is not visualized. Mild colonic diverticulosis. Vascular/Lymphatic: Mild calcified plaque over the abdominal aorta and iliac arteries. No significant adenopathy. Reproductive: Previous hysterectomy. Other: Subtle hazy appearance to the mid mesentery. Small amount of free fluid over the right mid abdomen. Subtle stranding of the fat in the right inguinal region. Musculoskeletal: Mild degenerative change of the spine. Mild to moderate compression fracture of L4 age indeterminate but new compared to 2016. IMPRESSION: Subtle changes as described which may be due to mild acute pancreatitis. Right-sided nephrolithiasis. No evidence of ureteral stones or obstruction. Colonic diverticulosis. Aortic Atherosclerosis (ICD10-I70.0). Moderate L4 compression fracture age indeterminate but new since 2016. Electronically Signed   By: Marin Olp M.D.   On: 03/07/2018 20:28   Dg Chest 2 View  Result Date: 03/07/2018 CLINICAL DATA:  Syncope. EXAM: CHEST - 2 VIEW COMPARISON:  None. FINDINGS: The heart size and mediastinal contours are within normal limits. Both lungs are clear. The visualized skeletal structures are unremarkable. IMPRESSION: No active cardiopulmonary disease. Electronically Signed   By: Dorise Bullion III M.D   On: 03/07/2018 17:02   Ct Head Wo Contrast  Result Date: 03/07/2018 CLINICAL DATA:  Syncopal episode today. EXAM: CT HEAD WITHOUT CONTRAST TECHNIQUE: Contiguous axial images were obtained from the base of the skull through the vertex without intravenous  contrast. COMPARISON:  Brain MRI and head CT 05/03/2006. FINDINGS: Brain: No evidence of acute infarction, hemorrhage, hydrocephalus, extra-axial collection or mass lesion/mass effect. Vascular: No hyperdense vessel or unexpected calcification. Skull: Normal. Negative for fracture or focal lesion. Sinuses/Orbits: Negative. Other: None. IMPRESSION: Negative head CT. Electronically Signed   By: Inge Rise M.D.  On: 03/07/2018 16:20    EKG:   Orders placed or performed during the hospital encounter of 03/07/18  . EKG 12-Lead  . EKG 12-Lead  . ED EKG  . ED EKG    IMPRESSION AND PLAN:  Principal Problem:   Syncope -unclear etiology, suspect possible dehydration leading to orthostasis.  Her medications may likely have transiently dropped her blood pressure more.  However, I cannot find a recent echocardiogram in her work-up.  We will admit tonight, get echocardiogram and cardiology consult.  Gentle IV fluids tonight for hydration. Active Problems:   CAD (coronary artery disease) -continue home meds   Diabetes ([place]) -sliding scale insulin with corresponding glucose checks   HTN (hypertension) -hold antihypertensives for now as her blood pressure is normotensive   Anxiety -home dose anxiolytic   GERD (gastroesophageal reflux disease) -Home dose PPI   HLD (hyperlipidemia) -Home dose antilipid  Chart review performed and case discussed with ED provider. Labs, imaging and/or ECG reviewed by provider and discussed with patient/family. Management plans discussed with the patient and/or family.  DVT PROPHYLAXIS: SubQ lovenox   GI PROPHYLAXIS:  PPI   ADMISSION STATUS: Observation  CODE STATUS: Full Code Status History    Date Active Date Inactive Code Status Order ID Comments User Context   02/24/2018 1106 02/25/2018 1451 Full Code [id number]  [name], MD Inpatient   02/22/2018 2227 02/24/2018 1106 Full Code [id number]  Vaughan Basta, MD Inpatient      TOTAL TIME  TAKING CARE OF THIS PATIENT: 40 minutes.   Holleigh Crihfield [place] 03/07/2018, 9:46 PM  [facility]  Office  [phone]  CC: Primary care physician; Elaine Harrier, MD  Note:  This document was prepared using Dragon voice recognition software and may include unintentional dictation errors.

## 2018-03-07 NOTE — ED Triage Notes (Addendum)
Pt arrives [facility] from home for syncopal episode today while standing and taking BP pressure. They state pt was orthostatic with them, 40 point drop in BP. Cardiac stents placed last week. 22 L hand, received fluids with EMS. Taking brilenta. A&O upon arrival, speaking in complete sentences.

## 2018-03-07 NOTE — ED Notes (Signed)
ED Provider at bedside. 

## 2018-03-08 ENCOUNTER — Observation Stay: Payer: Medicare HMO

## 2018-03-08 ENCOUNTER — Observation Stay
Admit: 2018-03-08 | Discharge: 2018-03-08 | Disposition: A | Discharge status: Home / Self Care | Payer: Medicare HMO | Attending: Internal Medicine | Admitting: Internal Medicine

## 2018-03-08 ENCOUNTER — Ambulatory Visit: Payer: Self-pay

## 2018-03-08 DIAGNOSIS — E876 Hypokalemia: Secondary | ICD-10-CM | POA: Diagnosis not present

## 2018-03-08 DIAGNOSIS — R55 Syncope and collapse: Secondary | ICD-10-CM | POA: Diagnosis not present

## 2018-03-08 DIAGNOSIS — E119 Type 2 diabetes mellitus without complications: Secondary | ICD-10-CM | POA: Diagnosis not present

## 2018-03-08 DIAGNOSIS — I2510 Atherosclerotic heart disease of native coronary artery without angina pectoris: Secondary | ICD-10-CM | POA: Diagnosis not present

## 2018-03-08 DIAGNOSIS — I10 Essential (primary) hypertension: Secondary | ICD-10-CM | POA: Diagnosis not present

## 2018-03-08 DIAGNOSIS — M1611 Unilateral primary osteoarthritis, right hip: Secondary | ICD-10-CM | POA: Diagnosis not present

## 2018-03-08 LAB — ECHOCARDIOGRAM COMPLETE
HEIGHTINCHES: 62 in
WEIGHTICAEL: 2694.4 [oz_av]

## 2018-03-08 LAB — BASIC METABOLIC PANEL
Anion gap: 7 (ref 5–15)
BUN: 8 mg/dL (ref 8–23)
CO2: 30 mmol/L (ref 22–32)
Calcium: 8.5 mg/dL — ABNORMAL LOW (ref 8.9–10.3)
Chloride: 100 mmol/L (ref 98–111)
Creatinine, Ser: 0.62 mg/dL (ref 0.44–1.00)
GFR calc Af Amer: >60 mL/min (ref >60)
Glucose, Bld: 124 mg/dL — ABNORMAL HIGH (ref 70–99)
POTASSIUM: 2.9 mmol/L — AB (ref 3.5–5.1)
Sodium: 137 mmol/L (ref 135–145)

## 2018-03-08 LAB — URINALYSIS, COMPLETE (UACMP) WITH MICROSCOPIC
Bilirubin Urine: NEGATIVE
Glucose, UA: NEGATIVE mg/dL
Ketones, ur: NEGATIVE mg/dL
Leukocytes, UA: NEGATIVE
Nitrite: NEGATIVE
Protein, ur: NEGATIVE mg/dL
Specific Gravity, Urine: 1.003 — ABNORMAL LOW (ref 1.005–1.030)
pH: 7 (ref 5.0–8.0)

## 2018-03-08 LAB — CBC
HCT: 31.8 % — ABNORMAL LOW (ref 36.0–46.0)
HEMOGLOBIN: 11.5 g/dL — AB (ref 12.0–15.0)
MCH: 30.6 pg (ref 26.0–34.0)
MCHC: 36.2 g/dL — ABNORMAL HIGH (ref 30.0–36.0)
MCV: 84.6 fL (ref 80.0–100.0)
Platelets: 293 10*3/uL (ref 150–400)
RBC: 3.76 MIL/uL — ABNORMAL LOW (ref 3.87–5.11)
RDW: 13.7 % (ref 11.5–15.5)
WBC: 8.7 10*3/uL (ref 4.0–10.5)
nRBC: 0 % (ref 0.0–0.2)

## 2018-03-08 LAB — GLUCOSE, CAPILLARY
GLUCOSE-CAPILLARY: 174 mg/dL — AB (ref 70–99)
Glucose-Capillary: 132 mg/dL — ABNORMAL HIGH (ref 70–99)
Glucose-Capillary: 136 mg/dL — ABNORMAL HIGH (ref 70–99)
Glucose-Capillary: 176 mg/dL — ABNORMAL HIGH (ref 70–99)

## 2018-03-08 LAB — LIPASE, BLOOD: Lipase: 34 U/L (ref 11–51)

## 2018-03-08 LAB — POTASSIUM: Potassium: 3.4 mmol/L — ABNORMAL LOW (ref 3.5–5.1)

## 2018-03-08 MED ORDER — POTASSIUM CHLORIDE 10 MEQ/100ML IV SOLN
10.0000 meq | INTRAVENOUS | Status: DC
  Administered 2018-03-08: 10 meq via INTRAVENOUS
  Filled 2018-03-08 (×3): qty 100

## 2018-03-08 MED ORDER — POTASSIUM CHLORIDE CRYS ER 20 MEQ PO TBCR
40.0000 meq | EXTENDED_RELEASE_TABLET | Freq: Once | ORAL | Status: AC
  Administered 2018-03-08: 40 meq via ORAL
  Filled 2018-03-08: qty 2

## 2018-03-08 MED ORDER — CLOPIDOGREL BISULFATE 75 MG PO TABS
75.0000 mg | ORAL_TABLET | Freq: Every day | ORAL | Status: DC
  Administered 2018-03-08 – 2018-03-09 (×2): 75 mg via ORAL
  Filled 2018-03-08 (×2): qty 1

## 2018-03-08 NOTE — Progress Notes (Signed)
*  PRELIMINARY RESULTS* Echocardiogram 2D Echocardiogram has been performed.  Elaine Cox 03/08/2018, 10:04 AM

## 2018-03-08 NOTE — Progress Notes (Signed)
Patient admitted with a diagnosis of near  syncope. A & O x 4. Patient oriented to her room, ascom/call bell and staff. Full assesment to epic completed. Will continue to monitor.

## 2018-03-08 NOTE — Consult Note (Signed)
[facility] Consultation Note  Patient ID: Elaine Cox, MRN: [id number], DOB/AGE: 1945-08-29 72 y.o. Admit date: 03/07/2018   Date of Consult: 03/08/2018 Primary Physician: Tracie Harrier, MD Primary Cardiologist: Ubaldo Glassing  Chief Complaint:  Chief Complaint  Patient presents with  . Loss of Consciousness   Reason for Consult: Syncope  HPI: 72 y.o. female with known coronary artery disease status post previous PCI and stent placement that occurred last month.  The patient does have known hypertension hyperlipidemia on appropriate high intensity cholesterol therapy and other medication management.  She has recently had an event of angina and underwent PCI and stent placement for which she is had some improvements but still is weak fatigue not feeling quite as well.  She has been short of breath and somewhat dizzy.  This is culminated in.  Where she had some dizziness to the point where she had presyncope and had to lay down on the floor while washing dishes in the sink.  The patient has had resolution of this issue although still is concerned about cardiovascular concerns and shortness of breath.  This may be secondary to multiple medications or current condition.  Patient currently is hemodynamically stable without any chest pain or myocardial infarction  Past Medical History:  Diagnosis Date  . Anginal pain ([place]) 06/2016   being followed by dr. Ubaldo Glassing  . Anxiety   . Arthritis    Osteoarthritis  . Arthritis    Rheumatoid  . Breast cancer ([place]) 2000   Right Breast - Chemotherapy  . 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  . Coronary artery disease   . Diabetes mellitus without complication [facility])    Patient takes Metformin  . Dyspnea 03/2017  . GERD (gastroesophageal reflux disease)   . History of abdominal hysterectomy   . History of eyelid surgery   . History of kidney stones   . Hyperlipidemia   . Hypertension   . Lumbago   . Myocardial  infarction ([place]) 07/2010  . Orthopnea 03/2017  . Personal history of chemotherapy 2000   right breast ca  . Sinus disorder   . Stroke [facility]) 2006, 2012  . Wears dentures    full upper and lower      Surgical History:  Past Surgical History:  Procedure Laterality Date  . ABDOMINAL HYSTERECTOMY    . BREAST SURGERY Right    mastectomy   . CARDIAC CATHETERIZATION  2012   stent placed  . CATARACT EXTRACTION W/PHACO Right 06/24/2017   Procedure: CATARACT EXTRACTION PHACO AND INTRAOCULAR LENS PLACEMENT ([place]) COMPLICATED RIGHT DIABETIC;  Surgeon: Leandrew Koyanagi, MD;  Location: [place];  Service: Ophthalmology;  Laterality: Right;  Diabetes - oral med  . COLONOSCOPY    . COLONOSCOPY WITH PROPOFOL N/A 12/08/2017   Procedure: COLONOSCOPY WITH PROPOFOL;  Surgeon: Toledo, Benay Pike, MD;  Location: [facility] ENDOSCOPY;  Service: Gastroenterology;  Laterality: N/A;  . CORONARY ANGIOPLASTY    . CORONARY STENT INTERVENTION N/A 02/24/2018   Procedure: CORONARY STENT INTERVENTION;  Surgeon: [name], MD;  Location: [place] CV LAB;  Service: Cardiovascular;  Laterality: N/A;  . CYSTOSCOPY N/A 09/08/2016   Procedure: CYSTOSCOPY;  Surgeon: Ward, Honor Loh, MD;  Location: [facility] ORS;  Service: Gynecology;  Laterality: N/A;  . EYE SURGERY Left    Cataract Extraction with IOL  . KNEE ARTHROSCOPY W/ MENISCAL REPAIR Right   . LAPAROSCOPIC BILATERAL SALPINGO OOPHERECTOMY Bilateral 09/08/2016   Procedure: LAPAROSCOPIC BILATERAL SALPINGO OOPHORECTOMY;  Surgeon: Larey Days  C, MD;  Location: [facility] ORS;  Service: Gynecology;  Laterality: Bilateral;  . LEFT HEART CATH AND CORONARY ANGIOGRAPHY Left 02/24/2018   Procedure: LEFT HEART CATH AND CORONARY ANGIOGRAPHY;  Surgeon: [name], MD;  Location: [place] [facility];  Service: Cardiovascular;  Laterality: Left;  Marland Kitchen MASTECTOMY Right 2000   BREAST CA     Home Meds: Prior to Admission medications   Medication Sig Start Date End  Date Taking? Authorizing Provider  ALPRAZolam (XANAX) 0.25 MG tablet Take 0.25 mg by mouth at bedtime.    Yes [provider]  amLODipine (NORVASC) 5 MG tablet Take 5 mg by mouth 2 (two) times daily.    Yes [provider]  aspirin EC 81 MG tablet Take 81 mg by mouth daily.   Yes [provider]  atorvastatin (LIPITOR) 80 MG tablet Take 1 tablet (80 mg total) by mouth daily at 6 PM. 02/25/18 04/26/18 Yes Sainani, Belia Heman, MD  Biotin 800 MCG TABS Take 1 tablet by mouth daily.    Yes [provider]  bisacodyl (DULCOLAX) 5 MG EC tablet Take 5 mg by mouth daily as needed for moderate constipation.   Yes [provider]  diphenhydrAMINE (BENADRYL) 25 MG tablet Take 25 mg by mouth daily as needed for allergies.   Yes [provider]  hydrochlorothiazide (HYDRODIURIL) 25 MG tablet Take 25 mg by mouth daily.    Yes [provider]  hydrOXYzine (ATARAX/VISTARIL) 10 MG tablet Take 10 mg by mouth daily.    Yes [provider]  ibuprofen (ADVIL,MOTRIN) 200 MG tablet Take 600 mg by mouth 2 (two) times daily as needed for moderate pain.   Yes [provider]  Menthol-Methyl Salicylate (MUSCLE RUB) 10-15 % CREA Apply 1 application topically as needed for muscle pain.   Yes [provider]  metFORMIN (GLUCOPHAGE) 500 MG tablet Take 500 mg by mouth 2 (two) times daily with a meal.   Yes [provider]  metoprolol succinate (TOPROL-XL) 100 MG 24 hr tablet Take 100 mg by mouth every evening.    Yes [provider]  mupirocin ointment (BACTROBAN) 2 % Apply 1 application topically 3 (three) times daily. 03/03/18  Yes [provider]  nitroGLYCERIN (NITROSTAT) 0.4 MG SL tablet Place 0.4 mg under the tongue every 5 (five) minutes as needed for chest pain.    Yes [provider]  omeprazole (PRILOSEC) 20 MG capsule Take 20 mg by mouth daily.   Yes [provider]  Polyvinyl  Alcohol-Povidone (MURINE TEARS FOR DRY EYES OP) Apply 1 drop to eye daily as needed (dry eyes).   Yes [provider]  potassium chloride (K-DUR,KLOR-CON) 10 MEQ tablet Take 10 mEq by mouth 2 (two) times daily.    Yes [provider]  ticagrelor (BRILINTA) 90 MG TABS tablet Take 1 tablet (90 mg total) by mouth 2 (two) times daily. 02/25/18 04/26/18 Yes Sainani, Belia Heman, MD  Tofacitinib Citrate (XELJANZ) 5 MG TABS Take 5 mg by mouth 2 (two) times daily.    Yes [provider]    Inpatient Medications:  . ALPRAZolam  0.25 mg Oral QHS  . aspirin EC  81 mg Oral Daily  . atorvastatin  80 mg Oral q1800  . clopidogrel  75 mg Oral Daily  . enoxaparin (LOVENOX) injection  40 mg Subcutaneous Q24H  . hydrOXYzine  10 mg Oral Daily  . insulin aspart  0-9 Units Subcutaneous TID WC  . pantoprazole  40 mg Oral  Daily  . Tofacitinib Citrate  5 mg Oral BID   . sodium chloride    . sodium chloride 75 mL/hr at 03/08/18 0458    Allergies:  Allergies  Allergen Reactions  . Doxazosin Other (See Comments)    "headache"  . Fish Allergy Anaphylaxis, Itching, Nausea Only and Swelling    She states her tongue turns black.  . Dicloxacillin Nausea And Vomiting    Has patient had a PCN reaction causing immediate rash, facial/tongue/throat swelling, SOB or lightheadedness with hypotension: No Has patient had a PCN reaction causing severe rash involving mucus membranes or skin necrosis: No Has patient had a PCN reaction that required hospitalization: Unknown Has patient had a PCN reaction occurring within the last 10 years: Unknown If all of the above answers are "NO", then may proceed with Cephalosporin use.   . Iodine Hives    Per patient, she had a previous reaction/hives from an iodine injection  . Sulfa Antibiotics Swelling    Loss of appetite   Constipation bloating  . Bactrim [Sulfamethoxazole-Trimethoprim] Nausea Only  . Beta Adrenergic Blockers Other (See Comments)     "Unknown"  . Cardura [Doxazosin Mesylate]     Headache   . Etodolac Itching and Swelling  . Azithromycin Itching  . Clopidogrel Nausea And Vomiting  . Colchicine Nausea Only and Other (See Comments)    Dizzness  . Glucosamine Nausea And Vomiting  . Leflunomide Diarrhea and Itching  . Meloxicam Itching  . Methotrexate Nausea Only  . Zoloft [Sertraline] Nausea And Vomiting    Social History   Socioeconomic History  . Marital status: Widowed    Spouse name: Not on file  . Number of children: Not on file  . Years of education: Not on file  . Highest education level: Not on file  Occupational History  . Not on file  Social Needs  . Financial resource strain: Not on file  . Food insecurity:    Worry: Not on file    Inability: Not on file  . Transportation needs:    Medical: Not on file    Non-medical: Not on file  Tobacco Use  . Smoking status: Never Smoker  . Smokeless tobacco: Never Used  Substance and Sexual Activity  . Alcohol use: No  . Drug use: No  . Sexual activity: Not on file  Lifestyle  . Physical activity:    Days per week: Not on file    Minutes per session: Not on file  . Stress: Not on file  Relationships  . Social connections:    Talks on phone: Not on file    Gets together: Not on file    Attends religious service: Not on file    Active member of club or organization: Not on file    Attends meetings of clubs or organizations: Not on file    Relationship status: Not on file  . Intimate partner violence:    Fear of current or ex partner: Not on file    Emotionally abused: Not on file    Physically abused: Not on file    Forced sexual activity: Not on file  Other Topics Concern  . Not on file  Social History Narrative  . Not on file     Family History  Problem Relation Age of Onset  . Diabetes type II Mother   . Hypertension Mother   . Colon polyps Mother   . Breast cancer Maternal Aunt      Review of Systems  Positive for  presyncope Negative for: General:  chills, fever, night sweats or weight changes.  Cardiovascular: PND orthopnea positive for syncope dizziness  Dermatological skin lesions rashes Respiratory: Cough congestion Urologic: Frequent urination urination at night and hematuria Abdominal: negative for nausea, vomiting, diarrhea, bright red blood per rectum, melena, or hematemesis Neurologic: negative for visual changes, and/or hearing changes  All other systems reviewed and are otherwise negative except as noted above.  Labs: Recent Labs    03/07/18 1543 03/07/18 1842  TROPONINI 0.03* <0.03   Lab Results  Component Value Date   WBC 8.7 03/08/2018   HGB 11.5 (L) 03/08/2018   HCT 31.8 (L) 03/08/2018   MCV 84.6 03/08/2018   PLT 293 03/08/2018    Recent Labs  Lab 03/07/18 1842 03/08/18 0421  NA  --  137  K  --  2.9*  CL  --  100  CO2  --  30  BUN  --  8  CREATININE  --  0.62  CALCIUM  --  8.5*  PROT 4.5*  --   BILITOT 1.2  --   ALKPHOS 27*  --   ALT 10  --   AST 18  --   GLUCOSE  --  124*   No results found for: CHOL, HDL, LDLCALC, TRIG No results found for: DDIMER  Radiology/Studies:  Ct Abdomen Pelvis Wo Contrast  Result Date: 03/07/2018 CLINICAL DATA:  Right flank pain. Patient on anticoagulation. Evaluate for retroperitoneal bleed. Patient with contrast allergy. EXAM: CT ABDOMEN AND PELVIS WITHOUT CONTRAST TECHNIQUE: Multidetector CT imaging of the abdomen and pelvis was performed following the standard protocol without IV contrast. COMPARISON:  01/02/2015 FINDINGS: Lower chest: Lung bases are normal. Hepatobiliary: Gallbladder, liver and biliary tree are normal. Pancreas: There is subtle ill definition of the peripancreatic fat planes adjacent the head and body of the pancreas. Findings may be seen and mild acute pancreatitis. Spleen: Normal. Adrenals/Urinary Tract: Adrenal glands are normal. Kidneys are normal in size with mild right-sided nephrolithiasis. No significant  hydronephrosis. Ureters and bladder are normal. Stomach/Bowel: Stomach and small bowel are normal. Appendix is not visualized. Mild colonic diverticulosis. Vascular/Lymphatic: Mild calcified plaque over the abdominal aorta and iliac arteries. No significant adenopathy. Reproductive: Previous hysterectomy. Other: Subtle hazy appearance to the mid mesentery. Small amount of free fluid over the right mid abdomen. Subtle stranding of the fat in the right inguinal region. Musculoskeletal: Mild degenerative change of the spine. Mild to moderate compression fracture of L4 age indeterminate but new compared to 2016. IMPRESSION: Subtle changes as described which may be due to mild acute pancreatitis. Right-sided nephrolithiasis. No evidence of ureteral stones or obstruction. Colonic diverticulosis. Aortic Atherosclerosis (ICD10-I70.0). Moderate L4 compression fracture age indeterminate but new since 2016. Electronically Signed   By: Marin Olp M.D.   On: 03/07/2018 20:28   Dg Chest 2 View  Result Date: 03/07/2018 CLINICAL DATA:  Syncope. EXAM: CHEST - 2 VIEW COMPARISON:  None. FINDINGS: The heart size and mediastinal contours are within normal limits. Both lungs are clear. The visualized skeletal structures are unremarkable. IMPRESSION: No active cardiopulmonary disease. Electronically Signed   By: Dorise Bullion III M.D   On: 03/07/2018 17:02   Dg Chest 2 View  Result Date: 02/22/2018 CLINICAL DATA:  Chest pain. EXAM: CHEST - 2 VIEW COMPARISON:  05/18/2012 FINDINGS: The heart size and mediastinal contours are within normal limits. Both lungs are clear. The visualized skeletal structures are unremarkable. IMPRESSION: No active cardiopulmonary disease. Electronically Signed  By: Kerby Moors M.D.   On: 02/22/2018 18:50   Ct Head Wo Contrast  Result Date: 03/07/2018 CLINICAL DATA:  Syncopal episode today. EXAM: CT HEAD WITHOUT CONTRAST TECHNIQUE: Contiguous axial images were obtained from the base of the  skull through the vertex without intravenous contrast. COMPARISON:  Brain MRI and head CT 05/03/2006. FINDINGS: Brain: No evidence of acute infarction, hemorrhage, hydrocephalus, extra-axial collection or mass lesion/mass effect. Vascular: No hyperdense vessel or unexpected calcification. Skull: Normal. Negative for fracture or focal lesion. Sinuses/Orbits: Negative. Other: None. IMPRESSION: Negative head CT. Electronically Signed   By: Inge Rise M.D.   On: 03/07/2018 16:20    EKG: Sinus rhythm with nonspecific ST and T wave changes  Weights: Filed Weights   03/07/18 1537 03/07/18 2240  Weight: 81.2 kg 76.4 kg     Physical Exam: Blood pressure 122/65, pulse 68, temperature 98.7 F (37.1 C), temperature source Oral, resp. rate 20, height 5\' 2"  (1.575 m), weight 76.4 kg, SpO2 98 %. Body mass index is 30.8 kg/m. General: Well developed, well nourished, in no acute distress. Head eyes ears nose throat: Normocephalic, atraumatic, sclera non-icteric, no xanthomas, nares are without discharge. No apparent thyromegaly and/or mass  Lungs: Normal respiratory effort.  no wheezes, no rales, no rhonchi.  Heart: RRR with normal S1 S2.  2+ left upper sternal border murmur gallop, no rub, PMI is normal size and placement, carotid upstroke normal without bruit, jugular venous pressure is normal Abdomen: Soft, non-tender, non-distended with normoactive bowel sounds. No hepatomegaly. No rebound/guarding. No obvious abdominal masses. Abdominal aorta is normal size without bruit Extremities: No edema. no cyanosis, no clubbing, no ulcers  Peripheral : 2+ bilateral upper extremity pulses, 2+ bilateral femoral pulses, 2+ bilateral dorsal pedal pulse Neuro: Alert and oriented. No facial asymmetry. No focal deficit. Moves all extremities spontaneously. Musculoskeletal: Normal muscle tone without kyphosis Psych:  Responds to questions appropriately with a normal affect.    Assessment: 72 year old female  with known coronary disease status post PCI and stent placement essential hypertension mixed hyperlipidemia abnormal EKG with currently no evidence of congestive heart failure myocardial infarction but concerns of presyncope and shortness of breath possibly secondary to medication management  Plan: 1.  Change Brilinta to Plavix to assess improvements in symptoms of weakness fatigue shortness of breath and presyncope 2.  Continue high intensity cholesterol therapy 3.  Ambulation and follow for improvements of symptoms and watch for further significance presyncope syncope in consider discharge home if patient ambulating well with further manipulation of medication management for assessment and treatment of current condition sign 4.  No further cardiac diagnostics necessary at this time  Signed, Corey Skains M.D. [facility] 03/08/2018, 8:30 AM

## 2018-03-08 NOTE — Progress Notes (Signed)
Patient ambulated in hall with nurse tech as SBA and gait belt.  Tech states patient ambulated without difficulty, did c/o some tiredness when returned to room.  No other complaints.

## 2018-03-08 NOTE — Progress Notes (Signed)
Patient ambulated to bathroom with SBA and gait belt for safety.  Patient tolerated well.  No c/o dizziness with ambulation.

## 2018-03-08 NOTE — Progress Notes (Signed)
Patient states that the IV potassium "is burning and don't want it". Potassium had just started so patient received only small amount.  Dr. Margaretmary Eddy made aware and switched to PO.  Patient agreeable with plan.

## 2018-03-08 NOTE — Care Management Obs Status (Signed)
[place] NOTIFICATION   Patient Details  Name: Elaine Cox MRN: [id number] Date of Birth: 1946/01/07   Medicare Observation Status Notification Given:  Yes    Elza Rafter, RN 03/08/2018, 2:27 PM

## 2018-03-08 NOTE — Progress Notes (Signed)
[facility] [place] NAME: Elaine Cox    MR#:  [id number]  DATE OF BIRTH:  [birth year]  SUBJECTIVE:  CHIEF COMPLAINT: Patient is feeling weak and tired.  Denies any other episodes of syncope or feeling lightheaded.  Denies any chest pain shortness of breath.  Ambulated in the hallway okay  REVIEW OF SYSTEMS:  CONSTITUTIONAL: No fever, fatigue or weakness.  EYES: No blurred or double vision.  EARS, NOSE, AND THROAT: No tinnitus or ear pain.  RESPIRATORY: No cough, shortness of breath, wheezing or hemoptysis.  CARDIOVASCULAR: No chest pain, orthopnea, edema.  GASTROINTESTINAL: No nausea, vomiting, diarrhea or abdominal pain.  GENITOURINARY: No dysuria, hematuria.  ENDOCRINE: No polyuria, nocturia,  HEMATOLOGY: No anemia, easy bruising or bleeding SKIN: No rash or lesion. MUSCULOSKELETAL: No joint pain or arthritis.   NEUROLOGIC: No tingling, numbness, weakness.  PSYCHIATRY: No anxiety or depression.   DRUG ALLERGIES:   Allergies  Allergen Reactions  . Doxazosin Other (See Comments)    "headache"  . Fish Allergy Anaphylaxis, Itching, Nausea Only and Swelling    She states her tongue turns black.  . Dicloxacillin Nausea And Vomiting    Has patient had a PCN reaction causing immediate rash, facial/tongue/throat swelling, SOB or lightheadedness with hypotension: No Has patient had a PCN reaction causing severe rash involving mucus membranes or skin necrosis: No Has patient had a PCN reaction that required hospitalization: Unknown Has patient had a PCN reaction occurring within the last 10 years: Unknown If all of the above answers are "NO", then may proceed with Cephalosporin use.   . Iodine Hives    Per patient, she had a previous reaction/hives from an iodine injection  . Sulfa Antibiotics Swelling    Loss of appetite   Constipation bloating  . Bactrim [Sulfamethoxazole-Trimethoprim] Nausea Only  . Beta Adrenergic Blockers  Other (See Comments)    "Unknown"  . Cardura [Doxazosin Mesylate]     Headache   . Etodolac Itching and Swelling  . Azithromycin Itching  . Clopidogrel Nausea And Vomiting  . Colchicine Nausea Only and Other (See Comments)    Dizzness  . Glucosamine Nausea And Vomiting  . Leflunomide Diarrhea and Itching  . Meloxicam Itching  . Methotrexate Nausea Only  . Zoloft [Sertraline] Nausea And Vomiting    VITALS:  Blood pressure 126/64, pulse 81, temperature 98.7 F (37.1 C), temperature source Oral, resp. rate 20, height 5\' 2"  (1.575 m), weight 76.4 kg, SpO2 100 %.  PHYSICAL EXAMINATION:  GENERAL:  72 y.o.-year-old patient lying in the bed with no acute distress.  EYES: Pupils equal, round, reactive to light and accommodation. No scleral icterus. Extraocular muscles intact.  HEENT: Head atraumatic, normocephalic. Oropharynx and nasopharynx clear.  NECK:  Supple, no jugular venous distention. No thyroid enlargement, no tenderness.  LUNGS: Normal breath sounds bilaterally, no wheezing, rales,rhonchi or crepitation. No use of accessory muscles of respiration.  CARDIOVASCULAR: S1, S2 normal. No murmurs, rubs, or gallops.  ABDOMEN: Soft, nontender, nondistended. Bowel sounds present.  EXTREMITIES: No pedal edema, cyanosis, or clubbing.  NEUROLOGIC: Cranial nerves II through XII are intact. Muscle strength 5/5 in all extremities. Sensation intact. Gait not checked.  PSYCHIATRIC: The patient is alert and oriented x 3.  SKIN: No obvious rash, lesion, or ulcer.    LABORATORY PANEL:   CBC Recent Labs  Lab 03/08/18 0421  WBC 8.7  HGB 11.5*  HCT 31.8*  PLT 293   ------------------------------------------------------------------------------------------------------------------  Chemistries  Recent Labs  Lab 03/07/18 1842 03/08/18 0421  NA  --  137  K  --  2.9*  CL  --  100  CO2  --  30  GLUCOSE  --  124*  BUN  --  8  CREATININE  --  0.62  CALCIUM  --  8.5*  AST 18  --   ALT 10   --   ALKPHOS 27*  --   BILITOT 1.2  --    ------------------------------------------------------------------------------------------------------------------  Cardiac Enzymes Recent Labs  Lab 03/07/18 1842  TROPONINI <0.03   ------------------------------------------------------------------------------------------------------------------  RADIOLOGY:  Ct Abdomen Pelvis Wo Contrast  Result Date: 03/07/2018 CLINICAL DATA:  Right flank pain. Patient on anticoagulation. Evaluate for retroperitoneal bleed. Patient with contrast allergy. EXAM: CT ABDOMEN AND PELVIS WITHOUT CONTRAST TECHNIQUE: Multidetector CT imaging of the abdomen and pelvis was performed following the standard protocol without IV contrast. COMPARISON:  01/02/2015 FINDINGS: Lower chest: Lung bases are normal. Hepatobiliary: Gallbladder, liver and biliary tree are normal. Pancreas: There is subtle ill definition of the peripancreatic fat planes adjacent the head and body of the pancreas. Findings may be seen and mild acute pancreatitis. Spleen: Normal. Adrenals/Urinary Tract: Adrenal glands are normal. Kidneys are normal in size with mild right-sided nephrolithiasis. No significant hydronephrosis. Ureters and bladder are normal. Stomach/Bowel: Stomach and small bowel are normal. Appendix is not visualized. Mild colonic diverticulosis. Vascular/Lymphatic: Mild calcified plaque over the abdominal aorta and iliac arteries. No significant adenopathy. Reproductive: Previous hysterectomy. Other: Subtle hazy appearance to the mid mesentery. Small amount of free fluid over the right mid abdomen. Subtle stranding of the fat in the right inguinal region. Musculoskeletal: Mild degenerative change of the spine. Mild to moderate compression fracture of L4 age indeterminate but new compared to 2016. IMPRESSION: Subtle changes as described which may be due to mild acute pancreatitis. Right-sided nephrolithiasis. No evidence of ureteral stones or  obstruction. Colonic diverticulosis. Aortic Atherosclerosis (ICD10-I70.0). Moderate L4 compression fracture age indeterminate but new since 2016. Electronically Signed   By: Marin Olp M.D.   On: 03/07/2018 20:28   Dg Chest 2 View  Result Date: 03/07/2018 CLINICAL DATA:  Syncope. EXAM: CHEST - 2 VIEW COMPARISON:  None. FINDINGS: The heart size and mediastinal contours are within normal limits. Both lungs are clear. The visualized skeletal structures are unremarkable. IMPRESSION: No active cardiopulmonary disease. Electronically Signed   By: Dorise Bullion III M.D   On: 03/07/2018 17:02   Ct Head Wo Contrast  Result Date: 03/07/2018 CLINICAL DATA:  Syncopal episode today. EXAM: CT HEAD WITHOUT CONTRAST TECHNIQUE: Contiguous axial images were obtained from the base of the skull through the vertex without intravenous contrast. COMPARISON:  Brain MRI and head CT 05/03/2006. FINDINGS: Brain: No evidence of acute infarction, hemorrhage, hydrocephalus, extra-axial collection or mass lesion/mass effect. Vascular: No hyperdense vessel or unexpected calcification. Skull: Normal. Negative for fracture or focal lesion. Sinuses/Orbits: Negative. Other: None. IMPRESSION: Negative head CT. Electronically Signed   By: Inge Rise M.D.   On: 03/07/2018 16:20   Dg Femur, Min 2 Views Right  Result Date: 03/08/2018 CLINICAL DATA:  Worsening right hip pain since Sunday. Recent stent placement through right leg. Pain in the mid to upper thigh. EXAM: RIGHT FEMUR 2 VIEWS COMPARISON:  None. FINDINGS: Slight joint space narrowing of the right hip and moderate to marked joint space narrowing of the included right femorotibial compartment of the knee. No acute fracture or suspicious osseous lesions. No joint dislocation. The femoral head is spherical in appearance without flattening.  No retained foreign bodies, soft tissue mass or emphysema. IMPRESSION: Mild degenerative joint space narrowing of the right hip and  moderate-to-marked of the right knee. No acute osseous appearing abnormality. Electronically Signed   By: Ashley Royalty M.D.   On: 03/08/2018 14:03    EKG:   Orders placed or performed during the hospital encounter of 03/07/18  . EKG 12-Lead  . EKG 12-Lead  . ED EKG  . ED EKG    ASSESSMENT AND PLAN:   Hypokalemia Potassium at 2.9 after repleting, patient could not tolerate IV potassium.  Will continue potassium supplements and check magnesium level Patient is not comfortable to leave the hospital after 6 PM  Syncope -suspect possible dehydration and adverse effect of Brilinta Gentle hydration with IV fluids Seen by cardiology Dr. Nehemiah Massed who discontinued Brilinta and started the patient on Plavix as there is a possibility of dizzy spells with Brilinta echocardiogram I-left ventricular ejection fraction 55 to 60% left cavity size size was normal systolic function was normal  Patient ambulated okay in the hallway    CAD (coronary artery disease) status post PCI-continue home meds   Diabetes ([place]) -sliding scale insulin with corresponding glucose checks   HTN (hypertension) -hold antihypertensives for now as her blood pressure is normotensive   Anxiety -home dose anxiolytic   GERD  PPI   HLD (hyperlipidemia) -statin    All the records are reviewed and case discussed with Care Management/Social Workerr. Management plans discussed with the patient, family and they are in agreement.  CODE STATUS: fc   TOTAL TIME TAKING CARE OF THIS PATIENT: 36 minutes.   POSSIBLE D/C IN 1 DAYS, DEPENDING ON CLINICAL CONDITION.  Note: This dictation was prepared with Dragon dictation along with smaller phrase technology. Any transcriptional errors that result from this process are unintentional.   Nicholes Mango M.D on 03/08/2018 at 3:17 PM  Between 7am to 6pm - Pager - [phone] After 6pm go to [email or web address] - password [email or web address] [facility]  Office   [phone]  CC: Primary care physician; Tracie Harrier, MD

## 2018-03-09 DIAGNOSIS — E119 Type 2 diabetes mellitus without complications: Secondary | ICD-10-CM | POA: Diagnosis not present

## 2018-03-09 DIAGNOSIS — E876 Hypokalemia: Secondary | ICD-10-CM | POA: Diagnosis not present

## 2018-03-09 DIAGNOSIS — I2510 Atherosclerotic heart disease of native coronary artery without angina pectoris: Secondary | ICD-10-CM | POA: Diagnosis not present

## 2018-03-09 DIAGNOSIS — R55 Syncope and collapse: Secondary | ICD-10-CM | POA: Diagnosis not present

## 2018-03-09 LAB — BASIC METABOLIC PANEL
ANION GAP: 7 (ref 5–15)
BUN: 7 mg/dL — ABNORMAL LOW (ref 8–23)
CO2: 26 mmol/L (ref 22–32)
Calcium: 8.4 mg/dL — ABNORMAL LOW (ref 8.9–10.3)
Chloride: 106 mmol/L (ref 98–111)
Creatinine, Ser: 0.61 mg/dL (ref 0.44–1.00)
GFR calc Af Amer: >60 mL/min (ref >60)
GFR calc non Af Amer: >60 mL/min (ref >60)
Glucose, Bld: 139 mg/dL — ABNORMAL HIGH (ref 70–99)
POTASSIUM: 4.2 mmol/L (ref 3.5–5.1)
Sodium: 139 mmol/L (ref 135–145)

## 2018-03-09 LAB — GLUCOSE, CAPILLARY
GLUCOSE-CAPILLARY: 131 mg/dL — AB (ref 70–99)
Glucose-Capillary: 126 mg/dL — ABNORMAL HIGH (ref 70–99)

## 2018-03-09 MED ORDER — POTASSIUM CHLORIDE CRYS ER 20 MEQ PO TBCR
40.0000 meq | EXTENDED_RELEASE_TABLET | ORAL | Status: AC
  Administered 2018-03-09 (×2): 40 meq via ORAL
  Filled 2018-03-09 (×2): qty 2

## 2018-03-09 MED ORDER — CLOPIDOGREL BISULFATE 75 MG PO TABS: 75.0000 mg | ORAL_TABLET | Freq: Every day | ORAL | 0 refills | Status: DC

## 2018-03-09 MED ORDER — ACETAMINOPHEN 325 MG PO TABS: 650.0000 mg | ORAL_TABLET | Freq: Four times a day (QID) | ORAL | Status: AC | PRN

## 2018-03-09 NOTE — Plan of Care (Signed)
  Problem: Clinical Measurements: Goal: Ability to maintain clinical measurements within normal limits will improve Outcome: Adequate for Discharge   Problem: Activity: Goal: Risk for activity intolerance will decrease Outcome: Adequate for Discharge   Problem: Safety: Goal: Ability to remain free from injury will improve Outcome: Adequate for Discharge   Problem: Pain Managment: Goal: General experience of comfort will improve Outcome: Adequate for Discharge

## 2018-03-09 NOTE — Progress Notes (Signed)
Patient discharged home today with daughter, VSS, IV removed, prescription information for plavix, and prescription provided. Pt also provided with scheduled follow up appointments, no further concerns at this time.

## 2018-03-09 NOTE — Progress Notes (Signed)
Pharmacy Electrolyte Monitoring Consult:  Pharmacy consulted to assist in monitoring and replacing electrolytes in this 72 y.o. female admitted on 03/07/2018 with Loss of Consciousness   Labs:  Sodium (mmol/L)  Date Value  03/09/2018 139  05/18/2012 135 (L)   Potassium (mmol/L)  Date Value  03/09/2018 4.2  05/18/2012 3.1 (L)   Magnesium (mg/dL)  Date Value  02/23/2018 1.8   Calcium (mg/dL)  Date Value  03/09/2018 8.4 (L)   Calcium, Total (mg/dL)  Date Value  05/18/2012 8.7   Albumin (g/dL)  Date Value  03/07/2018 2.3 (L)    Assessment/Plan: 12/03 @ 0400 K 4.2 WNL. No further replacement needed. Pharmacy will sign off.  Tobie Lords, PharmD, BCPS Clinical Pharmacist 03/09/2018

## 2018-03-09 NOTE — Discharge Instructions (Signed)
Follow-up with primary care physician in 3 to 4 days Follow-up with CHF clinic in 3 days Follow-up with cardiology Dr. Ubaldo Glassing in a week

## 2018-03-09 NOTE — Progress Notes (Signed)
Cardiovascular and Pulmonary Nurse Navigator Note:   73 year old admitted with dx of hypokalemia and syncope.  Patient's PMHx of CAD - s/p recent PCI, DM, HTN, Anxiety, GERD, and HLD.    Rounded on patient to follow-up with patient about Cardiac Rehab program.  Patient unable to start Cardiac Rehab due to being readmitted to hospital.  Note:  Patient was scheduled to start Cardiac Rehab this week.  Patient sitting up in bed with HOB elevated.  Patient stated she is feeling better, but is weak.  Discussed with patient rescheduling Cardiac Rehab orientation.  Patient wanting to wait a couple of weeks before scheduling to see if her strength returns.  Patient's plan at the moment is for Cardiac Rehab dept to contact her in 2 weeks to reschedule orientation the first of the year.  Angela Nevin, Web designer, in Cardiac Rehab dept notified of the above.    Patient thanked me stopping in to see her.    Roanna Epley, RN, BSN, [facility]  Cardiovascular & Pulmonary Nurse Navigator  Direct Line: [phone]  Department Phone #: [phone] Fax: [phone]  Email Address: [email or web address]

## 2018-03-09 NOTE — Discharge Summary (Signed)
[facility] [place] NAME: Elaine Cox    MR#:  [id number]  DATE OF BIRTH:  02-07-1946  DATE OF ADMISSION:  03/07/2018 ADMITTING PHYSICIAN: Lance Coon, MD  DATE OF DISCHARGE: 03/09/18  PRIMARY CARE PHYSICIAN: Tracie Harrier, MD    ADMISSION DIAGNOSIS:  Orthostatic hypotension [I95.1] Syncope and collapse [R55]  DISCHARGE DIAGNOSIS:  Principal Problem:   Syncope Active Problems:   Anxiety   Diabetes (HCC)   HTN (hypertension)   GERD (gastroesophageal reflux disease)   CAD (coronary artery disease)   HLD (hyperlipidemia)   SECONDARY DIAGNOSIS:   Past Medical History:  Diagnosis Date  . Anginal pain ([place]) 06/2016   being followed by dr. Ubaldo Glassing  . Anxiety   . Arthritis    Osteoarthritis  . Arthritis    Rheumatoid  . Breast cancer ([place]) 2000   Right Breast - Chemotherapy  . 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  . Coronary artery disease   . Diabetes mellitus without complication [facility])    Patient takes Metformin  . Dyspnea 03/2017  . GERD (gastroesophageal reflux disease)   . History of abdominal hysterectomy   . History of eyelid surgery   . History of kidney stones   . Hyperlipidemia   . Hypertension   . Lumbago   . Myocardial infarction ([place]) 07/2010  . Orthopnea 03/2017  . Personal history of chemotherapy 2000   right breast ca  . Sinus disorder   . Stroke [facility]) 2006, 2012  . Wears dentures    full upper and lower    HOSPITAL COURSE:   HPI  Elaine Cox  is a 72 y.o. female who presents with chief complaint as above.  She presents after syncopal episode.  She was recently here in the hospital with an STEMI and had 2 stents placed during cardiac cath.  She states that for the past several days she has had decreased energy and decreased appetite.  She thought that she was having dizziness when she stood up because she was not eating enough.  Today in her kitchen she was  standing and had a syncopal episode.  In the ED work-up reveals largely normal labs, but with orthostatic hypotension.  Hospitalist were called for further evaluation  Hypokalemia Potassium at 2.9 after repleting, patient could not tolerate IV potassium.  Will continue potassium supplements and check magnesium level Patient is not comfortable to leave the hospital after 6 PM  Syncope -suspect possible dehydration and adverse effect of Brilinta Gentle hydration with IV fluids Seen by cardiology Dr. Nehemiah Massed who discontinued Brilinta and started the patient on Plavix as there is a possibility of dizzy spells with Brilinta echocardiogram I-left ventricular ejection fraction 55 to 60% left cavity size size was normal systolic function was normal  Patient ambulated okay in the hallway Holding all her antihypertensives including Toprol, hydrochlorothiazide and amlodipine  CAD (coronary artery disease) status post PCI-continue aspirin and Plavix is added to the regimen.  Brilinta discontinued in view of dizzy spells which could be the adverse effect of Brilinta Diabetes ([place]) -sliding scale insulin with corresponding glucose checks during hospital course.  Resume metformin HTN (hypertension) -hold antihypertensives for now as her blood pressure is normotensive Anxiety -home dose anxiolytic GERD  PPI HLD (hyperlipidemia) -statin  DISCHARGE CONDITIONS:   Stable   CONSULTS OBTAINED:  Treatment Team:  Yolonda Kida, MD Corey Skains, MD   PROCEDURES none   DRUG ALLERGIES:   Allergies  Allergen Reactions  . Doxazosin Other (See Comments)    "headache"  . Fish Allergy Anaphylaxis, Itching, Nausea Only and Swelling    She states her tongue turns black.  . Dicloxacillin Nausea And Vomiting    Has patient had a PCN reaction causing immediate rash, facial/tongue/throat swelling, SOB or lightheadedness with hypotension: No Has patient had a PCN reaction causing severe  rash involving mucus membranes or skin necrosis: No Has patient had a PCN reaction that required hospitalization: Unknown Has patient had a PCN reaction occurring within the last 10 years: Unknown If all of the above answers are "NO", then may proceed with Cephalosporin use.   . Iodine Hives    Per patient, she had a previous reaction/hives from an iodine injection  . Sulfa Antibiotics Swelling    Loss of appetite   Constipation bloating  . Bactrim [Sulfamethoxazole-Trimethoprim] Nausea Only  . Beta Adrenergic Blockers Other (See Comments)    "Unknown"  . Cardura [Doxazosin Mesylate]     Headache   . Etodolac Itching and Swelling  . Azithromycin Itching  . Clopidogrel Nausea And Vomiting  . Colchicine Nausea Only and Other (See Comments)    Dizzness  . Glucosamine Nausea And Vomiting  . Leflunomide Diarrhea and Itching  . Meloxicam Itching  . Methotrexate Nausea Only  . Zoloft [Sertraline] Nausea And Vomiting    DISCHARGE MEDICATIONS:   Allergies as of 03/09/2018      Reactions   Doxazosin Other (See Comments)   "headache"   Fish Allergy Anaphylaxis, Itching, Nausea Only, Swelling   She states her tongue turns black.   Dicloxacillin Nausea And Vomiting   Has patient had a PCN reaction causing immediate rash, facial/tongue/throat swelling, SOB or lightheadedness with hypotension: No Has patient had a PCN reaction causing severe rash involving mucus membranes or skin necrosis: No Has patient had a PCN reaction that required hospitalization: Unknown Has patient had a PCN reaction occurring within the last 10 years: Unknown If all of the above answers are "NO", then may proceed with Cephalosporin use.   Iodine Hives   Per patient, she had a previous reaction/hives from an iodine injection   Sulfa Antibiotics Swelling   Loss of appetite   Constipation bloating   Bactrim [sulfamethoxazole-trimethoprim] Nausea Only   Beta Adrenergic Blockers Other (See Comments)   "Unknown"    Cardura [doxazosin Mesylate]    Headache    Etodolac Itching, Swelling   Azithromycin Itching   Clopidogrel Nausea And Vomiting   Colchicine Nausea Only, Other (See Comments)   Dizzness   Glucosamine Nausea And Vomiting   Leflunomide Diarrhea, Itching   Meloxicam Itching   Methotrexate Nausea Only   Zoloft [sertraline] Nausea And Vomiting      Medication List    STOP taking these medications   amLODipine 5 MG tablet Commonly known as:  NORVASC   hydrochlorothiazide 25 MG tablet Commonly known as:  HYDRODIURIL   metoprolol succinate 100 MG 24 hr tablet Commonly known as:  TOPROL-XL   ticagrelor 90 MG Tabs tablet Commonly known as:  BRILINTA     TAKE these medications   acetaminophen 325 MG tablet Commonly known as:  TYLENOL Take 2 tablets (650 mg total) by mouth every 6 (six) hours as needed for mild pain (or Fever >/= 101).   ALPRAZolam 0.25 MG tablet Commonly known as:  XANAX Take 0.25 mg by mouth at bedtime.   aspirin EC 81 MG tablet Take 81 mg by mouth daily.   atorvastatin 80  MG tablet Commonly known as:  LIPITOR Take 1 tablet (80 mg total) by mouth daily at 6 PM.   Biotin 800 MCG Tabs Take 1 tablet by mouth daily.   bisacodyl 5 MG EC tablet Commonly known as:  DULCOLAX Take 5 mg by mouth daily as needed for moderate constipation.   clopidogrel 75 MG tablet Commonly known as:  PLAVIX Take 1 tablet (75 mg total) by mouth daily. Start taking on:  03/10/2018   diphenhydrAMINE 25 MG tablet Commonly known as:  BENADRYL Take 25 mg by mouth daily as needed for allergies.   hydrOXYzine 10 MG tablet Commonly known as:  ATARAX/VISTARIL Take 10 mg by mouth daily.   ibuprofen 200 MG tablet Commonly known as:  ADVIL,MOTRIN Take 600 mg by mouth 2 (two) times daily as needed for moderate pain.   metFORMIN 500 MG tablet Commonly known as:  GLUCOPHAGE Take 500 mg by mouth 2 (two) times daily with a meal.   mupirocin ointment 2 % Commonly known as:   BACTROBAN Apply 1 application topically 3 (three) times daily.   MURINE TEARS FOR DRY EYES OP Apply 1 drop to eye daily as needed (dry eyes).   MUSCLE RUB 10-15 % Crea Apply 1 application topically as needed for muscle pain.   nitroGLYCERIN 0.4 MG SL tablet Commonly known as:  NITROSTAT Place 0.4 mg under the tongue every 5 (five) minutes as needed for chest pain.   omeprazole 20 MG capsule Commonly known as:  PRILOSEC Take 20 mg by mouth daily.   potassium chloride 10 MEQ tablet Commonly known as:  K-DUR,KLOR-CON Take 10 mEq by mouth 2 (two) times daily.   XELJANZ 5 MG Tabs Generic drug:  Tofacitinib Citrate Take 5 mg by mouth 2 (two) times daily.        DISCHARGE INSTRUCTIONS:   Follow-up with primary care physician in 3 to 4 days Follow-up with CHF clinic in 3 days Follow-up with cardiology Dr. Ubaldo Glassing in a week  DIET:  Cardiac diet and Diabetic diet  DISCHARGE CONDITION:  Fair  ACTIVITY:  Activity as tolerated  OXYGEN:  Home Oxygen: No.   Oxygen Delivery: room air  DISCHARGE LOCATION:  home   If you experience worsening of your admission symptoms, develop shortness of breath, life threatening emergency, suicidal or homicidal thoughts you must seek medical attention immediately by calling 911 or calling your MD immediately  if symptoms less severe.  You Must read complete instructions/literature along with all the possible adverse reactions/side effects for all the Medicines you take and that have been prescribed to you. Take any new Medicines after you have completely understood and accpet all the possible adverse reactions/side effects.   Please note  You were cared for by a hospitalist during your hospital stay. If you have any questions about your discharge medications or the care you received while you were in the hospital after you are discharged, you can call the unit and asked to speak with the hospitalist on call if the hospitalist that took care of  you is not available. Once you are discharged, your primary care physician will handle any further medical issues. Please note that NO REFILLS for any discharge medications will be authorized once you are discharged, as it is imperative that you return to your primary care physician (or establish a relationship with a primary care physician if you do not have one) for your aftercare needs so that they can reassess your need for medications and monitor your lab  values.     Today  Chief Complaint  Patient presents with  . Loss of Consciousness   Patient is resting comfortably.  Denies any complaints.  No dizziness while ambulating.  Denies any chest pain and wants to go home  ROS:  CONSTITUTIONAL: Denies fevers, chills. Denies any fatigue, weakness.  EYES: Denies blurry vision, double vision, eye pain. EARS, NOSE, THROAT: Denies tinnitus, ear pain, hearing loss. RESPIRATORY: Denies cough, wheeze, shortness of breath.  CARDIOVASCULAR: Denies chest pain, palpitations, edema.  GASTROINTESTINAL: Denies nausea, vomiting, diarrhea, abdominal pain. Denies bright red blood per rectum. GENITOURINARY: Denies dysuria, hematuria. ENDOCRINE: Denies nocturia or thyroid problems. HEMATOLOGIC AND LYMPHATIC: Denies easy bruising or bleeding. SKIN: Denies rash or lesion. MUSCULOSKELETAL: Denies pain in neck, back, shoulder, knees, hips or arthritic symptoms.  NEUROLOGIC: Denies paralysis, paresthesias.  PSYCHIATRIC: Denies anxiety or depressive symptoms.   VITAL SIGNS:  Blood pressure 114/64, pulse 64, temperature 98.2 F (36.8 C), temperature source Oral, resp. rate 17, height 5\' 2"  (1.575 m), weight 76.4 kg, SpO2 97 %.  I/O:    Intake/Output Summary (Last 24 hours) at 03/09/2018 1224 Last data filed at 03/09/2018 1100 Gross per 24 hour  Intake 600 ml  Output 1450 ml  Net -850 ml    PHYSICAL EXAMINATION:  GENERAL:  72 y.o.-year-old patient lying in the bed with no acute distress.  EYES:  Pupils equal, round, reactive to light and accommodation. No scleral icterus. Extraocular muscles intact.  HEENT: Head atraumatic, normocephalic. Oropharynx and nasopharynx clear.  NECK:  Supple, no jugular venous distention. No thyroid enlargement, no tenderness.  LUNGS: Normal breath sounds bilaterally, no wheezing, rales,rhonchi or crepitation. No use of accessory muscles of respiration.  CARDIOVASCULAR: S1, S2 normal. No murmurs, rubs, or gallops.  ABDOMEN: Soft, non-tender, non-distended. Bowel sounds present. No organomegaly or mass.  EXTREMITIES: No pedal edema, cyanosis, or clubbing.  NEUROLOGIC: Cranial nerves II through XII are intact. Muscle strength 5/5 in all extremities. Sensation intact. Gait not checked.  PSYCHIATRIC: The patient is alert and oriented x 3.  SKIN: No obvious rash, lesion, or ulcer.   DATA REVIEW:   CBC Recent Labs  Lab 03/08/18 0421  WBC 8.7  HGB 11.5*  HCT 31.8*  PLT 293    Chemistries  Recent Labs  Lab 03/07/18 1842  03/09/18 0405  NA  --    < > 139  K  --    < > 4.2  CL  --    < > 106  CO2  --    < > 26  GLUCOSE  --    < > 139*  BUN  --    < > 7*  CREATININE  --    < > 0.61  CALCIUM  --    < > 8.4*  AST 18  --   --   ALT 10  --   --   ALKPHOS 27*  --   --   BILITOT 1.2  --   --    < > = values in this interval not displayed.    Cardiac Enzymes Recent Labs  Lab 03/07/18 1842  TROPONINI <0.03    Microbiology Results  Results for orders placed or performed during the hospital encounter of 07/13/15  Body fluid culture     Status: None   Collection Time: 07/13/15  1:45 PM  Result Value Ref Range Status   Specimen Description CYTOMISC  Final   Special Requests NONE  Final   Gram Stain RARE WBC  SEEN NO ORGANISMS SEEN   Final   Culture No growth aerobically or anaerobically.  Final   Report Status 07/17/2015 FINAL  Final    RADIOLOGY:  Ct Abdomen Pelvis Wo Contrast  Result Date: 03/07/2018 CLINICAL DATA:  Right flank pain.  Patient on anticoagulation. Evaluate for retroperitoneal bleed. Patient with contrast allergy. EXAM: CT ABDOMEN AND PELVIS WITHOUT CONTRAST TECHNIQUE: Multidetector CT imaging of the abdomen and pelvis was performed following the standard protocol without IV contrast. COMPARISON:  01/02/2015 FINDINGS: Lower chest: Lung bases are normal. Hepatobiliary: Gallbladder, liver and biliary tree are normal. Pancreas: There is subtle ill definition of the peripancreatic fat planes adjacent the head and body of the pancreas. Findings may be seen and mild acute pancreatitis. Spleen: Normal. Adrenals/Urinary Tract: Adrenal glands are normal. Kidneys are normal in size with mild right-sided nephrolithiasis. No significant hydronephrosis. Ureters and bladder are normal. Stomach/Bowel: Stomach and small bowel are normal. Appendix is not visualized. Mild colonic diverticulosis. Vascular/Lymphatic: Mild calcified plaque over the abdominal aorta and iliac arteries. No significant adenopathy. Reproductive: Previous hysterectomy. Other: Subtle hazy appearance to the mid mesentery. Small amount of free fluid over the right mid abdomen. Subtle stranding of the fat in the right inguinal region. Musculoskeletal: Mild degenerative change of the spine. Mild to moderate compression fracture of L4 age indeterminate but new compared to 2016. IMPRESSION: Subtle changes as described which may be due to mild acute pancreatitis. Right-sided nephrolithiasis. No evidence of ureteral stones or obstruction. Colonic diverticulosis. Aortic Atherosclerosis (ICD10-I70.0). Moderate L4 compression fracture age indeterminate but new since 2016. Electronically Signed   By: Marin Olp M.D.   On: 03/07/2018 20:28   Dg Chest 2 View  Result Date: 03/07/2018 CLINICAL DATA:  Syncope. EXAM: CHEST - 2 VIEW COMPARISON:  None. FINDINGS: The heart size and mediastinal contours are within normal limits. Both lungs are clear. The visualized skeletal structures are  unremarkable. IMPRESSION: No active cardiopulmonary disease. Electronically Signed   By: Dorise Bullion III M.D   On: 03/07/2018 17:02   Ct Head Wo Contrast  Result Date: 03/07/2018 CLINICAL DATA:  Syncopal episode today. EXAM: CT HEAD WITHOUT CONTRAST TECHNIQUE: Contiguous axial images were obtained from the base of the skull through the vertex without intravenous contrast. COMPARISON:  Brain MRI and head CT 05/03/2006. FINDINGS: Brain: No evidence of acute infarction, hemorrhage, hydrocephalus, extra-axial collection or mass lesion/mass effect. Vascular: No hyperdense vessel or unexpected calcification. Skull: Normal. Negative for fracture or focal lesion. Sinuses/Orbits: Negative. Other: None. IMPRESSION: Negative head CT. Electronically Signed   By: Inge Rise M.D.   On: 03/07/2018 16:20   Dg Femur, Min 2 Views Right  Result Date: 03/08/2018 CLINICAL DATA:  Worsening right hip pain since Sunday. Recent stent placement through right leg. Pain in the mid to upper thigh. EXAM: RIGHT FEMUR 2 VIEWS COMPARISON:  None. FINDINGS: Slight joint space narrowing of the right hip and moderate to marked joint space narrowing of the included right femorotibial compartment of the knee. No acute fracture or suspicious osseous lesions. No joint dislocation. The femoral head is spherical in appearance without flattening. No retained foreign bodies, soft tissue mass or emphysema. IMPRESSION: Mild degenerative joint space narrowing of the right hip and moderate-to-marked of the right knee. No acute osseous appearing abnormality. Electronically Signed   By: Ashley Royalty M.D.   On: 03/08/2018 14:03    EKG:   Orders placed or performed during the hospital encounter of 03/07/18  . EKG 12-Lead  . EKG 12-Lead  .  ED EKG  . ED EKG      Management plans discussed with the patient, family and they are in agreement.  CODE STATUS:     Code Status Orders  (From admission, onward)         Start     Ordered    03/07/18 2236  Full code  Continuous     03/07/18 2235        Code Status History    Date Active Date Inactive Code Status Order ID Comments User Context   02/24/2018 1106 02/25/2018 1451 Full Code [id number]  [name], MD Inpatient   02/22/2018 2227 02/24/2018 1106 Full Code [id number]  Vaughan Basta, MD Inpatient      TOTAL TIME TAKING CARE OF THIS PATIENT: 43 minutes.   Note: This dictation was prepared with Dragon dictation along with smaller phrase technology. Any transcriptional errors that result from this process are unintentional.   @[facility] @  on 03/09/2018 at 12:24 PM  Between 7am to 6pm - Pager - [phone]  After 6pm go to [email or web address] - password [email or web address] [facility]  Office  [phone]  CC: Primary care physician; Tracie Harrier, MD

## 2018-03-09 NOTE — Progress Notes (Signed)
Pharmacy Electrolyte Monitoring Consult:  Pharmacy consulted to assist in monitoring and replacing electrolytes in this 72 y.o. female admitted on 03/07/2018 with Loss of Consciousness   Labs:  Sodium (mmol/L)  Date Value  03/08/2018 137  05/18/2012 135 (L)   Potassium (mmol/L)  Date Value  03/08/2018 3.4 (L)  05/18/2012 3.1 (L)   Magnesium (mg/dL)  Date Value  02/23/2018 1.8   Calcium (mg/dL)  Date Value  03/08/2018 8.5 (L)   Calcium, Total (mg/dL)  Date Value  05/18/2012 8.7   Albumin (g/dL)  Date Value  03/07/2018 2.3 (L)    Assessment/Plan: 12/02 @ 2200 repeat K 3.4. Goal K 3.5 - 4.5  Will replace w/ KCI 40 mEq PO x 2 doses since patient is able to take PO. Will recheck electrolytes w/ am labs.  Tobie Lords, PharmD, BCPS Clinical Pharmacist 03/09/2018

## 2018-03-16 DIAGNOSIS — Z09 Encounter for follow-up examination after completed treatment for conditions other than malignant neoplasm: Secondary | ICD-10-CM | POA: Diagnosis not present

## 2018-03-16 DIAGNOSIS — E876 Hypokalemia: Secondary | ICD-10-CM | POA: Diagnosis not present

## 2018-03-16 DIAGNOSIS — I2510 Atherosclerotic heart disease of native coronary artery without angina pectoris: Secondary | ICD-10-CM | POA: Diagnosis not present

## 2018-03-16 DIAGNOSIS — E1165 Type 2 diabetes mellitus with hyperglycemia: Secondary | ICD-10-CM | POA: Diagnosis not present

## 2018-03-16 DIAGNOSIS — M059 Rheumatoid arthritis with rheumatoid factor, unspecified: Secondary | ICD-10-CM | POA: Diagnosis not present

## 2018-03-16 DIAGNOSIS — I10 Essential (primary) hypertension: Secondary | ICD-10-CM | POA: Diagnosis not present

## 2018-03-16 DIAGNOSIS — F419 Anxiety disorder, unspecified: Secondary | ICD-10-CM | POA: Diagnosis not present

## 2018-03-16 DIAGNOSIS — I6310 Cerebral infarction due to embolism of unspecified precerebral artery: Secondary | ICD-10-CM | POA: Diagnosis not present

## 2018-03-16 NOTE — Discharge Summary (Signed)
[facility] NAME: Elaine Cox    MR#:  [id number]  DATE OF BIRTH:  06-17-1945  DATE OF ADMISSION:  02/22/2018 ADMITTING PHYSICIAN: Vaughan Basta, MD  DATE OF DISCHARGE: 02/25/2018 11:46 AM  PRIMARY CARE PHYSICIAN: Tracie Harrier, MD   ADMISSION DIAGNOSIS:  Elevated troponin I level [R79.89] Chest pain, unspecified type [R07.9]  DISCHARGE DIAGNOSIS:  Principal Problem:   NSTEMI (non-ST elevated myocardial infarction) ([place]) Active Problems:   Anxiety   Diabetes (HCC)   HTN (hypertension)   GERD (gastroesophageal reflux disease)   CAD (coronary artery disease)   HLD (hyperlipidemia)   SECONDARY DIAGNOSIS:   Past Medical History:  Diagnosis Date  . Anginal pain ([place]) 06/2016   being followed by dr. Ubaldo Glassing  . Anxiety   . Arthritis    Osteoarthritis  . Arthritis    Rheumatoid  . Breast cancer ([place]) 2000   Right Breast - Chemotherapy  . 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  . Coronary artery disease   . Diabetes mellitus without complication [facility])    Patient takes Metformin  . Dyspnea 03/2017  . GERD (gastroesophageal reflux disease)   . History of abdominal hysterectomy   . History of eyelid surgery   . History of kidney stones   . Hyperlipidemia   . Hypertension   . Lumbago   . Myocardial infarction ([place]) 07/2010  . Orthopnea 03/2017  . Personal history of chemotherapy 2000   right breast ca  . Sinus disorder   . Stroke [facility]) 2006, 2012  . Wears dentures    full upper and lower     ADMITTING HISTORY  HISTORY OF PRESENT ILLNESS: Elaine Cox  is a 72 y.o. female with a known history of angina, arthritis, breast cancer, collagen vascular disease, coronary artery disease status post stents, diabetes, hyperlipidemia, hypertension, stroke-having intermittent pressure-like central chest pain for last 3 days with exertion which is resolving by using nitroglycerin  tablets. Concerned with this she came to emergency room today.  Noted to have elevated troponin, started on heparin IV drip and called hospitalist service to admit the patient.  HOSPITAL COURSE:   * NSTEMI Patient was admitted to telemetry floor.  Started on heparin drip.  Cardiology consulted.  Had cardiac catheterization and stent placement.  Continued on aspirin, Brilinta and high intensity statin along with a beta-blocker.  She did not have any further chest pain.  Prescriptions given for new medications and discharged home in stable condition.  *Hypokalemia was replaced and resolved.  Diabetes mellitus was well controlled.  Stable for discharge home.  CONSULTS OBTAINED:  Treatment Team:  [name], MD  DRUG ALLERGIES:   Allergies  Allergen Reactions  . Doxazosin Other (See Comments)    "headache"  . Fish Allergy Anaphylaxis, Itching, Nausea Only and Swelling    She states her tongue turns black.  . Dicloxacillin Nausea And Vomiting    Has patient had a PCN reaction causing immediate rash, facial/tongue/throat swelling, SOB or lightheadedness with hypotension: No Has patient had a PCN reaction causing severe rash involving mucus membranes or skin necrosis: No Has patient had a PCN reaction that required hospitalization: Unknown Has patient had a PCN reaction occurring within the last 10 years: Unknown If all of the above answers are "NO", then may proceed with Cephalosporin use.   . Iodine Hives    Per patient, she had a previous reaction/hives from an iodine injection  . Sulfa Antibiotics Swelling  Loss of appetite   Constipation bloating  . Bactrim [Sulfamethoxazole-Trimethoprim] Nausea Only  . Beta Adrenergic Blockers Other (See Comments)    "Unknown"  . Cardura [Doxazosin Mesylate]     Headache   . Etodolac Itching and Swelling  . Azithromycin Itching  . Clopidogrel Nausea And Vomiting  . Colchicine Nausea Only and Other (See Comments)    Dizzness   . Glucosamine Nausea And Vomiting  . Leflunomide Diarrhea and Itching  . Meloxicam Itching  . Methotrexate Nausea Only  . Zoloft [Sertraline] Nausea And Vomiting    DISCHARGE MEDICATIONS:   Allergies as of 02/25/2018      Reactions   Doxazosin Other (See Comments)   "headache"   Fish Allergy Anaphylaxis, Itching, Nausea Only, Swelling   She states her tongue turns black.   Dicloxacillin Nausea And Vomiting   Has patient had a PCN reaction causing immediate rash, facial/tongue/throat swelling, SOB or lightheadedness with hypotension: No Has patient had a PCN reaction causing severe rash involving mucus membranes or skin necrosis: No Has patient had a PCN reaction that required hospitalization: Unknown Has patient had a PCN reaction occurring within the last 10 years: Unknown If all of the above answers are "NO", then may proceed with Cephalosporin use.   Iodine Hives   Per patient, she had a previous reaction/hives from an iodine injection   Sulfa Antibiotics Swelling   Loss of appetite   Constipation bloating   Bactrim [sulfamethoxazole-trimethoprim] Nausea Only   Beta Adrenergic Blockers Other (See Comments)   "Unknown"   Cardura [doxazosin Mesylate]    Headache    Etodolac Itching, Swelling   Azithromycin Itching   Clopidogrel Nausea And Vomiting   Colchicine Nausea Only, Other (See Comments)   Dizzness   Glucosamine Nausea And Vomiting   Leflunomide Diarrhea, Itching   Meloxicam Itching   Methotrexate Nausea Only   Zoloft [sertraline] Nausea And Vomiting      Medication List    TAKE these medications   ALPRAZolam 0.25 MG tablet Commonly known as:  XANAX Take 0.25 mg by mouth at bedtime.   aspirin EC 81 MG tablet Take 81 mg by mouth daily.   atorvastatin 80 MG tablet Commonly known as:  LIPITOR Take 1 tablet (80 mg total) by mouth daily at 6 PM.   Biotin 800 MCG Tabs Take 1 tablet by mouth daily.   bisacodyl 5 MG EC tablet Commonly known as:   DULCOLAX Take 5 mg by mouth daily as needed for moderate constipation.   diphenhydrAMINE 25 MG tablet Commonly known as:  BENADRYL Take 25 mg by mouth daily as needed for allergies.   hydrOXYzine 10 MG tablet Commonly known as:  ATARAX/VISTARIL Take 10 mg by mouth daily.   ibuprofen 200 MG tablet Commonly known as:  ADVIL,MOTRIN Take 600 mg by mouth 2 (two) times daily as needed for moderate pain.   metFORMIN 500 MG tablet Commonly known as:  GLUCOPHAGE Take 500 mg by mouth 2 (two) times daily with a meal.   MURINE TEARS FOR DRY EYES OP Apply 1 drop to eye daily as needed (dry eyes).   MUSCLE RUB 10-15 % Crea Apply 1 application topically as needed for muscle pain.   nitroGLYCERIN 0.4 MG SL tablet Commonly known as:  NITROSTAT Place 0.4 mg under the tongue every 5 (five) minutes as needed for chest pain.   omeprazole 20 MG capsule Commonly known as:  PRILOSEC Take 20 mg by mouth daily.   potassium chloride 10 MEQ tablet  Commonly known as:  K-DUR,KLOR-CON Take 10 mEq by mouth 2 (two) times daily.   XELJANZ 5 MG Tabs Generic drug:  Tofacitinib Citrate Take 5 mg by mouth 2 (two) times daily.       Today   VITAL SIGNS:  Blood pressure 139/70, pulse 65, temperature 98.7 F (37.1 C), temperature source Oral, resp. rate 19, height 5\' 2"  (1.575 m), weight 81.2 kg, SpO2 98 %.  I/O:  No intake or output data in the 24 hours ending 03/16/18 1419  PHYSICAL EXAMINATION:  Physical Exam  GENERAL:  72 y.o.-year-old patient lying in the bed with no acute distress.  LUNGS: Normal breath sounds bilaterally, no wheezing, rales,rhonchi or crepitation. No use of accessory muscles of respiration.  CARDIOVASCULAR: S1, S2 normal. No murmurs, rubs, or gallops.  ABDOMEN: Soft, non-tender, non-distended. Bowel sounds present. No organomegaly or mass.  NEUROLOGIC: Moves all 4 extremities. PSYCHIATRIC: The patient is alert and oriented x 3.  SKIN: No obvious rash, lesion, or ulcer.    DATA REVIEW:   CBC No results for input(s): WBC, HGB, HCT, PLT in the last 168 hours.  Chemistries  No results for input(s): NA, K, CL, CO2, GLUCOSE, BUN, CREATININE, CALCIUM, MG, AST, ALT, ALKPHOS, BILITOT in the last 168 hours.  Invalid input(s): GFRCGP  Cardiac Enzymes No results for input(s): TROPONINI in the last 168 hours.  Microbiology Results  Results for orders placed or performed during the hospital encounter of 07/13/15  Body fluid culture     Status: None   Collection Time: 07/13/15  1:45 PM  Result Value Ref Range Status   Specimen Description CYTOMISC  Final   Special Requests NONE  Final   Gram Stain RARE WBC SEEN NO ORGANISMS SEEN   Final   Culture No growth aerobically or anaerobically.  Final   Report Status 07/17/2015 FINAL  Final    RADIOLOGY:  No results found.  Follow up with PCP in 1 week.  Management plans discussed with the patient, family and they are in agreement.  CODE STATUS:  Code Status History    Date Active Date Inactive Code Status Order ID Comments User Context   03/07/2018 2235 03/09/2018 1906 Full Code [id number]  Lance Coon, MD ED   02/24/2018 1106 02/25/2018 1451 Full Code [id number]  [name], MD Inpatient   02/22/2018 2227 02/24/2018 1106 Full Code [id number]  Vaughan Basta, MD Inpatient      TOTAL TIME TAKING CARE OF THIS PATIENT ON DAY OF DISCHARGE: more than 30 minutes.   Elaine Cox M.D on 03/16/2018 at 2:19 PM  Between 7am to 6pm - Pager - [phone]  After 6pm go to [email or web address] - password [email or web address] [facility]  Office  [phone]  CC: Primary care physician; Tracie Harrier, MD  Note: This dictation was prepared with Dragon dictation along with smaller phrase technology. Any transcriptional errors that result from this process are unintentional.

## 2018-04-12 ENCOUNTER — Encounter: Payer: Medicare HMO | Attending: Cardiology | Admitting: *Deleted

## 2018-04-12 ENCOUNTER — Encounter: Payer: Self-pay | Admitting: *Deleted

## 2018-04-12 VITALS — Ht 63.6 in | Wt 180.0 lb

## 2018-04-12 DIAGNOSIS — I214 Non-ST elevation (NSTEMI) myocardial infarction: Secondary | ICD-10-CM | POA: Diagnosis not present

## 2018-04-12 DIAGNOSIS — Z955 Presence of coronary angioplasty implant and graft: Secondary | ICD-10-CM | POA: Diagnosis not present

## 2018-04-12 NOTE — Progress Notes (Signed)
Daily Session Note  Patient Details  Name: Elaine Cox MRN: [id number] Date of Birth: [birth year] Referring Provider:     Cardiac Rehab from 04/12/2018 in [facility]  Referring Provider  [name] MD      Encounter Date: 04/12/2018  Check In: Session Check In - 04/12/18 1123      Check-In   Supervising physician immediately available to respond to emergencies  See telemetry face sheet for immediately available ER MD    Location  [facility] & Pul[facility]    Staff Present  Gerlene Burdock, RN, BSN;Jessica Luan Pulling, MA, RCEP, CCRP, Exercise Physiologist;Meredith Sherryll Burger, RN BSN    Medication changes reported      No    Fall or balance concerns reported     No    Tobacco Cessation  No Change    Warm-up and Cool-down  Not performed (comment)   med review completed   Resistance Training Performed  Yes    VAD Patient?  No    PAD/SET Patient?  No        Exercise Prescription Changes - 04/12/18 1400      Response to Exercise   Blood Pressure (Admit)  132/70    Blood Pressure (Exercise)  166/64    Blood Pressure (Exit)  146/66    Heart Rate (Admit)  66 bpm    Heart Rate (Exercise)  93 bpm    Heart Rate (Exit)  58 bpm    Oxygen Saturation (Admit)  96 %    Oxygen Saturation (Exercise)  99 %    Rating of Perceived Exertion (Exercise)  10    Symptoms  none    Comments  walk test results       Social History   Tobacco Use  Smoking Status Never Smoker  Smokeless Tobacco Never Used    Goals Met:  Proper associated with RPD/PD & O2 Sat Exercise tolerated well Personal goals reviewed No report of cardiac concerns or symptoms Strength training completed today  Goals Unmet:  Not Applicable  Comments: Med Review completed.    Dr. Emily Filbert is Medical Director for [facility].

## 2018-04-12 NOTE — Progress Notes (Signed)
Cardiac Individual Treatment Plan  Patient Details  Name: Elaine Cox MRN: [id number] Date of Birth: Feb 09, 1946 Referring Provider:     Cardiac Rehab from 04/12/2018 in [facility] Cardiac and Pulmonary Rehab  Referring Provider  [name] MD      Initial Encounter Date:    Cardiac Rehab from 04/12/2018 in [facility] Cardiac and Pulmonary Rehab  Date  04/12/18      Visit Diagnosis: NSTEMI (non-ST elevated myocardial infarction) [facility])  Status post coronary artery stent placement  Patient's Home Medications on Admission:  Current Outpatient Medications:  .  ALPRAZolam (XANAX) 0.25 MG tablet, Take 0.25 mg by mouth at bedtime., Disp: , Rfl:  .  acetaminophen (TYLENOL) 325 MG tablet, Take 2 tablets (650 mg total) by mouth every 6 (six) hours as needed for mild pain (or Fever >/= 101)., Disp: , Rfl:  .  aspirin EC 81 MG tablet, Take 81 mg by mouth daily., Disp: , Rfl:  .  atorvastatin (LIPITOR) 80 MG tablet, Take 1 tablet (80 mg total) by mouth daily at 6 PM., Disp: 30 tablet, Rfl: 1 .  Biotin 800 MCG TABS, Take 1 tablet by mouth daily. , Disp: , Rfl:  .  bisacodyl (DULCOLAX) 5 MG EC tablet, Take 5 mg by mouth daily as needed for moderate constipation., Disp: , Rfl:  .  clopidogrel (PLAVIX) 75 MG tablet, Take 1 tablet (75 mg total) by mouth daily., Disp: 30 tablet, Rfl: 0 .  diphenhydrAMINE (BENADRYL) 25 MG tablet, Take 25 mg by mouth daily as needed for allergies., Disp: , Rfl:  .  hydrOXYzine (ATARAX/VISTARIL) 10 MG tablet, Take 10 mg by mouth daily. , Disp: , Rfl:  .  ibuprofen (ADVIL,MOTRIN) 200 MG tablet, Take 600 mg by mouth 2 (two) times daily as needed for moderate pain., Disp: , Rfl:  .  Menthol-Methyl Salicylate (MUSCLE RUB) 10-15 % CREA, Apply 1 application topically as needed for muscle pain., Disp: , Rfl:  .  metFORMIN (GLUCOPHAGE) 500 MG tablet, Take 500 mg by mouth 2 (two) times daily with a meal., Disp: , Rfl:  .  mupirocin ointment (BACTROBAN) 2 %, Apply 1 application  topically 3 (three) times daily., Disp: , Rfl: 0 .  nitroGLYCERIN (NITROSTAT) 0.4 MG SL tablet, Place 0.4 mg under the tongue every 5 (five) minutes as needed for chest pain. , Disp: , Rfl:  .  omeprazole (PRILOSEC) 20 MG capsule, Take 20 mg by mouth daily., Disp: , Rfl:  .  Polyvinyl Alcohol-Povidone (MURINE TEARS FOR DRY EYES OP), Apply 1 drop to eye daily as needed (dry eyes)., Disp: , Rfl:  .  potassium chloride (K-DUR,KLOR-CON) 10 MEQ tablet, Take 10 mEq by mouth 2 (two) times daily. , Disp: , Rfl:  .  Tofacitinib Citrate (XELJANZ) 5 MG TABS, Take 5 mg by mouth 2 (two) times daily. , Disp: , Rfl:   Past Medical History: Past Medical History:  Diagnosis Date  . Anginal pain ([place]) 06/2016   being followed by dr. Ubaldo Glassing  . Anxiety   . Arthritis    Osteoarthritis  . Arthritis    Rheumatoid  . Breast cancer ([place]) 2000   Right Breast - Chemotherapy  . 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  . Coronary artery disease   . Diabetes mellitus without complication [facility])    Patient takes Metformin  . Dyspnea 03/2017  . GERD (gastroesophageal reflux disease)   . History of abdominal hysterectomy   . History of eyelid surgery   .  History of kidney stones   . Hyperlipidemia   . Hypertension   . Lumbago   . Myocardial infarction ([place]) 07/2010  . Orthopnea 03/2017  . Personal history of chemotherapy 2000   right breast ca  . Sinus disorder   . Stroke [facility]) 2006, 2012  . Wears dentures    full upper and lower    Tobacco Use: Social History   Tobacco Use  Smoking Status Never Smoker  Smokeless Tobacco Never Used    Labs: Recent Review Flowsheet Data    Labs for ITP Cardiac and Pulmonary Rehab Latest Ref Rng & Units 02/23/2018   Hemoglobin A1c 4.8 - 5.6 % 6.2(H)       Exercise Target Goals: Exercise Program Goal: Individual exercise prescription set using results from initial 6 min walk test and THRR while considering  patient's activity barriers and  safety.   Exercise Prescription Goal: Initial exercise prescription builds to 30-45 minutes a day of aerobic activity, 2-3 days per week.  Home exercise guidelines will be given to patient during program as part of exercise prescription that the participant will acknowledge.  Activity Barriers & Risk Stratification: Activity Barriers & Cardiac Risk Stratification - 04/12/18 1406      Activity Barriers & Cardiac Risk Stratification   Activity Barriers  Arthritis;Joint Problems;Deconditioning;Muscular Weakness;Balance Concerns;History of Falls   RA in knees   Cardiac Risk Stratification  High       6 Minute Walk: 6 Minute Walk    Row Name 04/12/18 1405         6 Minute Walk   Phase  Initial     Distance  1140 feet     Walk Time  6 minutes     # of Rest Breaks  0     MPH  2.16     METS  2.49     RPE  10     VO2 Peak  8.71     Symptoms  No     Resting HR  66 bpm     Resting BP  132/70     Resting Oxygen Saturation   96 %     Exercise Oxygen Saturation  during 6 min walk  99 %     Max Ex. HR  93 bpm     Max Ex. BP  166/64     2 Minute Post BP  146/66        Oxygen Initial Assessment:   Oxygen Re-Evaluation:   Oxygen Discharge (Final Oxygen Re-Evaluation):   Initial Exercise Prescription: Initial Exercise Prescription - 04/12/18 1400      Date of Initial Exercise RX and Referring Provider   Date  04/12/18    Referring Provider  Paraschos, Alexander MD      NuStep   Level  1    SPM  80    Minutes  15    METs  2      Recumbant Elliptical   Level  1    RPM  50    Minutes  15    METs  2      Track   Laps  30    Minutes  15    METs  2.38      Prescription Details   Frequency (times per week)  2    Duration  Progress to 45 minutes of aerobic exercise without signs/symptoms of physical distress      Intensity   THRR 40-80% of Max Heartrate  99-132  Ratings of Perceived Exertion  11-13    Perceived Dyspnea  0-4      Progression   Progression   Continue to progress workloads to maintain intensity without signs/symptoms of physical distress.      Resistance Training   Training Prescription  Yes    Weight  3 lbs    Reps  10-15       Perform Capillary Blood Glucose checks as needed.  Exercise Prescription Changes: Exercise Prescription Changes    Row Name 04/12/18 1400             Response to Exercise   Blood Pressure (Admit)  132/70       Blood Pressure (Exercise)  166/64       Blood Pressure (Exit)  146/66       Heart Rate (Admit)  66 bpm       Heart Rate (Exercise)  93 bpm       Heart Rate (Exit)  58 bpm       Oxygen Saturation (Admit)  96 %       Oxygen Saturation (Exercise)  99 %       Rating of Perceived Exertion (Exercise)  10       Symptoms  none       Comments  walk test results          Exercise Comments:   Exercise Goals and Review: Exercise Goals    Row Name 04/12/18 1408             Exercise Goals   Increase Physical Activity  Yes       Intervention  Provide advice, education, support and counseling about physical activity/exercise needs.;Develop an individualized exercise prescription for aerobic and resistive training based on initial evaluation findings, risk stratification, comorbidities and participant's personal goals.       Expected Outcomes  Short Term: Attend rehab on a regular basis to increase amount of physical activity.;Long Term: Exercising regularly at least 3-5 days a week.;Long Term: Add in home exercise to make exercise part of routine and to increase amount of physical activity.       Increase Strength and Stamina  Yes       Intervention  Provide advice, education, support and counseling about physical activity/exercise needs.;Develop an individualized exercise prescription for aerobic and resistive training based on initial evaluation findings, risk stratification, comorbidities and participant's personal goals.       Expected Outcomes  Short Term: Increase workloads from  initial exercise prescription for resistance, speed, and METs.;Short Term: Perform resistance training exercises routinely during rehab and add in resistance training at home;Long Term: Improve cardiorespiratory fitness, muscular endurance and strength as measured by increased METs and functional capacity (6MWT)       Able to understand and use rate of perceived exertion (RPE) scale  Yes       Intervention  Provide education and explanation on how to use RPE scale       Expected Outcomes  Short Term: Able to use RPE daily in rehab to express subjective intensity level;Long Term:  Able to use RPE to guide intensity level when exercising independently       Knowledge and understanding of Target Heart Rate Range (THRR)  Yes       Intervention  Provide education and explanation of THRR including how the numbers were predicted and where they are located for reference       Expected Outcomes  Short Term: Able to state/look up  THRR;Short Term: Able to use daily as guideline for intensity in rehab;Long Term: Able to use THRR to govern intensity when exercising independently       Able to check pulse independently  Yes       Intervention  Provide education and demonstration on how to check pulse in carotid and radial arteries.;Review the importance of being able to check your own pulse for safety during independent exercise       Expected Outcomes  Short Term: Able to explain why pulse checking is important during independent exercise;Long Term: Able to check pulse independently and accurately       Understanding of Exercise Prescription  Yes       Intervention  Provide education, explanation, and written materials on patient's individual exercise prescription       Expected Outcomes  Short Term: Able to explain program exercise prescription;Long Term: Able to explain home exercise prescription to exercise independently          Exercise Goals Re-Evaluation :   Discharge Exercise Prescription (Final  Exercise Prescription Changes): Exercise Prescription Changes - 04/12/18 1400      Response to Exercise   Blood Pressure (Admit)  132/70    Blood Pressure (Exercise)  166/64    Blood Pressure (Exit)  146/66    Heart Rate (Admit)  66 bpm    Heart Rate (Exercise)  93 bpm    Heart Rate (Exit)  58 bpm    Oxygen Saturation (Admit)  96 %    Oxygen Saturation (Exercise)  99 %    Rating of Perceived Exertion (Exercise)  10    Symptoms  none    Comments  walk test results       Nutrition:  Target Goals: Understanding of nutrition guidelines, daily intake of sodium <1566m, cholesterol <2060m calories 30% from fat and 7% or less from saturated fats, daily to have 5 or more servings of fruits and vegetables.  Biometrics: Pre Biometrics - 04/12/18 1409      Pre Biometrics   Height  5' 3.6" (1.615 m)    Weight  180 lb (81.6 kg)    Waist Circumference  37.5 inches    Hip Circumference  45 inches    Waist to Hip Ratio  0.83 %    BMI (Calculated)  31.3    Single Leg Stand  4.53 seconds        Nutrition Therapy Plan and Nutrition Goals: Nutrition Therapy & Goals - 04/12/18 1133      Nutrition Therapy   Drug/Food Interactions  Statins/Certain Fruits      Intervention Plan   Intervention  Prescribe, educate and counsel regarding individualized specific dietary modifications aiming towards targeted core components such as weight, hypertension, lipid management, diabetes, heart failure and other comorbidities.    Expected Outcomes  Short Term Goal: Understand basic principles of dietary content, such as calories, fat, sodium, cholesterol and nutrients.;Long Term Goal: Adherence to prescribed nutrition plan.       Nutrition Assessments: Nutrition Assessments - 04/12/18 1133      MEDFICTS Scores   Pre Score  21       Nutrition Goals Re-Evaluation:   Nutrition Goals Discharge (Final Nutrition Goals Re-Evaluation):   Psychosocial: Target Goals: Acknowledge presence or absence  of significant depression and/or stress, maximize coping skills, provide positive support system. Participant is able to verbalize types and ability to use techniques and skills needed for reducing stress and depression.   Initial Review & Psychosocial Screening: Initial  Psych Review & Screening - 04/12/18 1407      Initial Review   Current issues with  Current Sleep Concerns;Current Stress Concerns    Source of Stress Concerns  Unable to perform yard/household activities;Family      Family Dynamics   Good Support System?  Yes   friends     Barriers   Psychosocial barriers to participate in program  There are no identifiable barriers or psychosocial needs.;The patient should benefit from training in stress management and relaxation.      Screening Interventions   Interventions  Encouraged to exercise;Program counselor consult;To provide support and resources with identified psychosocial needs;Provide feedback about the scores to participant    Expected Outcomes  Short Term goal: Utilizing psychosocial counselor, staff and physician to assist with identification of specific Stressors or current issues interfering with healing process. Setting desired goal for each stressor or current issue identified.;Long Term Goal: Stressors or current issues are controlled or eliminated.;Short Term goal: Identification and review with participant of any Quality of Life or Depression concerns found by scoring the questionnaire.;Long Term goal: The participant improves quality of Life and PHQ9 Scores as seen by post scores and/or verbalization of changes       Quality of Life Scores:  Quality of Life - 04/12/18 1133      Quality of Life   Select  Quality of Life      Quality of Life Scores   Health/Function Pre  17.9 %    Socioeconomic Pre  29 %    Psych/Spiritual Pre  26.57 %    Family Pre  21.3 %    GLOBAL Pre  22.27 %      Scores of 19 and below usually indicate a poorer quality of life in  these areas.  A difference of  2-3 points is a clinically meaningful difference.  A difference of 2-3 points in the total score of the Quality of Life Index has been associated with significant improvement in overall quality of life, self-image, physical symptoms, and general health in studies assessing change in quality of life.  PHQ-9: Recent Review Flowsheet Data    Depression screen [facility] 2/9 04/12/2018 03/03/2018   Decreased Interest 1 0   Down, Depressed, Hopeless 1 0   PHQ - 2 Score 2 0   Altered sleeping 1 -   Tired, decreased energy 1 -   Change in appetite 1 -   Feeling bad or failure about yourself  0 -   Trouble concentrating 1 -   Moving slowly or fidgety/restless 1 -   Suicidal thoughts 0 -   PHQ-9 Score 7 -   Difficult doing work/chores Somewhat difficult -     Interpretation of Total Score  Total Score Depression Severity:  1-4 = Minimal depression, 5-9 = Mild depression, 10-14 = Moderate depression, 15-19 = Moderately severe depression, 20-27 = Severe depression   Psychosocial Evaluation and Intervention: Psychosocial Evaluation - 04/12/18 1132      Psychosocial Evaluation & Interventions   Interventions  Therapist referral;Encouraged to exercise with the program and follow exercise prescription    Comments  Her Granddaughter was born with birth defects and was taken off the ventilator and died at age 29 this past [place] said was a stressor for her. Her daughter had a stroke 2 years ago and Analicia has been helping her.     Continue Psychosocial Services   Follow up required by counselor       Psychosocial Re-Evaluation:  Psychosocial Discharge (Final Psychosocial Re-Evaluation):   Vocational Rehabilitation: Provide vocational rehab assistance to qualifying candidates.   Vocational Rehab Evaluation & Intervention: Vocational Rehab - 04/12/18 1407      Initial Vocational Rehab Evaluation & Intervention   Assessment shows need for Vocational  Rehabilitation  No       Education: Education Goals: Education classes will be provided on a variety of topics geared toward better understanding of heart health and risk factor modification. Participant will state understanding/return demonstration of topics presented as noted by education test scores.  Learning Barriers/Preferences: Learning Barriers/Preferences - 04/12/18 1405      Learning Barriers/Preferences   Learning Barriers  Sight    Learning Preferences  Individual Instruction       Education Topics:  AED/CPR: - Group verbal and written instruction with the use of models to demonstrate the basic use of the AED with the basic ABC's of resuscitation.   General Nutrition Guidelines/Fats and Fiber: -Group instruction provided by verbal, written material, models and posters to present the general guidelines for heart healthy nutrition. Gives an explanation and review of dietary fats and fiber.   Controlling Sodium/Reading Food Labels: -Group verbal and written material supporting the discussion of sodium use in heart healthy nutrition. Review and explanation with models, verbal and written materials for utilization of the food label.   Exercise Physiology & General Exercise Guidelines: - Group verbal and written instruction with models to review the exercise physiology of the cardiovascular system and associated critical values. Provides general exercise guidelines with specific guidelines to those with heart or lung disease.    Aerobic Exercise & Resistance Training: - Gives group verbal and written instruction on the various components of exercise. Focuses on aerobic and resistive training programs and the benefits of this training and how to safely progress through these programs..   Flexibility, Balance, Mind/Body Relaxation: Provides group verbal/written instruction on the benefits of flexibility and balance training, including mind/body exercise modes such as yoga,  pilates and tai chi.  Demonstration and skill practice provided.   Stress and Anxiety: - Provides group verbal and written instruction about the health risks of elevated stress and causes of high stress.  Discuss the correlation between heart/lung disease and anxiety and treatment options. Review healthy ways to manage with stress and anxiety.   Depression: - Provides group verbal and written instruction on the correlation between heart/lung disease and depressed mood, treatment options, and the stigmas associated with seeking treatment.   Anatomy & Physiology of the Heart: - Group verbal and written instruction and models provide basic cardiac anatomy and physiology, with the coronary electrical and arterial systems. Review of Valvular disease and Heart Failure   Cardiac Procedures: - Group verbal and written instruction to review commonly prescribed medications for heart disease. Reviews the medication, class of the drug, and side effects. Includes the steps to properly store meds and maintain the prescription regimen. (beta blockers and nitrates)   Cardiac Medications I: - Group verbal and written instruction to review commonly prescribed medications for heart disease. Reviews the medication, class of the drug, and side effects. Includes the steps to properly store meds and maintain the prescription regimen.   Cardiac Medications II: -Group verbal and written instruction to review commonly prescribed medications for heart disease. Reviews the medication, class of the drug, and side effects. (all other drug classes)    Go Sex-Intimacy & Heart Disease, Get SMART - Goal Setting: - Group verbal and written instruction through game format to  discuss heart disease and the return to sexual intimacy. Provides group verbal and written material to discuss and apply goal setting through the application of the S.M.A.R.T. Method.   Other Matters of the Heart: - Provides group verbal, written  materials and models to describe Stable Angina and Peripheral Artery. Includes description of the disease process and treatment options available to the cardiac patient.   Exercise & Equipment Safety: - Individual verbal instruction and demonstration of equipment use and safety with use of the equipment.   Cardiac Rehab from 04/12/2018 in [facility] Cardiac and Pulmonary Rehab  Date  04/12/18  Educator  Renita Papa, RN  Instruction Review Code  1- Verbalizes Understanding      Infection Prevention: - Provides verbal and written material to individual with discussion of infection control including proper hand washing and proper equipment cleaning during exercise session.   Cardiac Rehab from 04/12/2018 in [facility] Cardiac and Pulmonary Rehab  Date  04/12/18  Educator  Renita Papa, RN  Instruction Review Code  1- Verbalizes Understanding      Falls Prevention: - Provides verbal and written material to individual with discussion of falls prevention and safety.   Cardiac Rehab from 04/12/2018 in [facility] Cardiac and Pulmonary Rehab  Date  04/12/18  Educator  Renita Papa, RN  Instruction Review Code  1- Verbalizes Understanding      Diabetes: - Individual verbal and written instruction to review signs/symptoms of diabetes, desired ranges of glucose level fasting, after meals and with exercise. Acknowledge that pre and post exercise glucose checks will be done for 3 sessions at entry of program.   Know Your Numbers and Risk Factors: -Group verbal and written instruction about important numbers in your health.  Discussion of what are risk factors and how they play a role in the disease process.  Review of Cholesterol, Blood Pressure, Diabetes, and BMI and the role they play in your overall health.   Sleep Hygiene: -Provides group verbal and written instruction about how sleep can affect your health.  Define sleep hygiene, discuss sleep cycles and impact of sleep habits. Review good sleep  hygiene tips.    Other: -Provides group and verbal instruction on various topics (see comments)   Knowledge Questionnaire Score: Knowledge Questionnaire Score - 04/12/18 1406      Knowledge Questionnaire Score   Pre Score  22/26   correct answers reviewed with Leda Gauze. Focus on anatomy, angina, nutrition, exercise      Core Components/Risk Factors/Patient Goals at Admission: Personal Goals and Risk Factors at Admission - 04/12/18 1403      Core Components/Risk Factors/Patient Goals on Admission    Weight Management  Yes;Weight Loss    Intervention  Weight Management: Develop a combined nutrition and exercise program designed to reach desired caloric intake, while maintaining appropriate intake of nutrient and fiber, sodium and fats, and appropriate energy expenditure required for the weight goal.;Weight Management: Provide education and appropriate resources to help participant work on and attain dietary goals.;Weight Management/Obesity: Establish reasonable short term and long term weight goals.;Obesity: Provide education and appropriate resources to help participant work on and attain dietary goals.    Admit Weight  180 lb (81.6 kg)    Goal Weight: Short Term  175 lb (79.4 kg)    Goal Weight: Long Term  150 lb (68 kg)    Expected Outcomes  Short Term: Continue to assess and modify interventions until short term weight is achieved;Long Term: Adherence to nutrition and physical activity/exercise program aimed toward  attainment of established weight goal;Weight Loss: Understanding of general recommendations for a balanced deficit meal plan, which promotes 1-2 lb weight loss per week and includes a negative energy balance of [phone] kcal/d;Understanding recommendations for meals to include 15-35% energy as protein, 25-35% energy from fat, 35-60% energy from carbohydrates, less than 245m of dietary cholesterol, 20-35 gm of total fiber daily;Understanding of distribution of calorie intake  throughout the day with the consumption of 4-5 meals/snacks    Diabetes  Yes    Intervention  Provide education about signs/symptoms and action to take for hypo/hyperglycemia.;Provide education about proper nutrition, including hydration, and aerobic/resistive exercise prescription along with prescribed medications to achieve blood glucose in normal ranges: Fasting glucose 65-99 mg/dL    Expected Outcomes  Short Term: Participant verbalizes understanding of the signs/symptoms and immediate care of hyper/hypoglycemia, proper foot care and importance of medication, aerobic/resistive exercise and nutrition plan for blood glucose control.;Long Term: Attainment of HbA1C < 7%.    Hypertension  Yes    Intervention  Provide education on lifestyle modifcations including regular physical activity/exercise, weight management, moderate sodium restriction and increased consumption of fresh fruit, vegetables, and low fat dairy, alcohol moderation, and smoking cessation.;Monitor prescription use compliance.    Expected Outcomes  Short Term: Continued assessment and intervention until BP is < 140/936mHG in hypertensive participants. < 130/8020mG in hypertensive participants with diabetes, heart failure or chronic kidney disease.;Long Term: Maintenance of blood pressure at goal levels.    Lipids  Yes    Intervention  Provide education and support for participant on nutrition & aerobic/resistive exercise along with prescribed medications to achieve LDL <12m1mDL >40mg44m Expected Outcomes  Short Term: Participant states understanding of desired cholesterol values and is compliant with medications prescribed. Participant is following exercise prescription and nutrition guidelines.;Long Term: Cholesterol controlled with medications as prescribed, with individualized exercise RX and with personalized nutrition plan. Value goals: LDL < 12mg,17m > 40 mg.       Core Components/Risk Factors/Patient Goals Review:    Core  Components/Risk Factors/Patient Goals at Discharge (Final Review):    ITP Comments: ITP Comments    Row Name 04/12/18 1401           ITP Comments  Med Review completed. Initial ITP created. Diagnosis can be found in [facility]          Comments: Initial ITP

## 2018-04-12 NOTE — Patient Instructions (Signed)
Patient Instructions  Patient Details  Name: Elaine Cox MRN: [id number] Date of Birth: [birth year] Referring Provider:  [name], MD  Below are your personal goals for exercise, nutrition, and risk factors. Our goal is to help you stay on track towards obtaining and maintaining these goals. We will be discussing your progress on these goals with you throughout the program.  Initial Exercise Prescription: Initial Exercise Prescription - 04/12/18 1400      Date of Initial Exercise RX and Referring Provider   Date  04/12/18    Referring Provider  Paraschos, Alexander MD      NuStep   Level  1    SPM  80    Minutes  15    METs  2      Recumbant Elliptical   Level  1    RPM  50    Minutes  15    METs  2      Track   Laps  30    Minutes  15    METs  2.38      Prescription Details   Frequency (times per week)  2    Duration  Progress to 45 minutes of aerobic exercise without signs/symptoms of physical distress      Intensity   THRR 40-80% of Max Heartrate  99-132    Ratings of Perceived Exertion  11-13    Perceived Dyspnea  0-4      Progression   Progression  Continue to progress workloads to maintain intensity without signs/symptoms of physical distress.      Resistance Training   Training Prescription  Yes    Weight  3 lbs    Reps  10-15       Exercise Goals: Frequency: Be able to perform aerobic exercise two to three times per week in program working toward 2-5 days per week of home exercise.  Intensity: Work with a perceived exertion of 11 (fairly light) - 15 (hard) while following your exercise prescription.  We will make changes to your prescription with you as you progress through the program.   Duration: Be able to do 30 to 45 minutes of continuous aerobic exercise in addition to a 5 minute warm-up and a 5 minute cool-down routine.   Nutrition Goals: Your personal nutrition goals will be established when you do your nutrition analysis with  the dietician.  The following are general nutrition guidelines to follow: Cholesterol < 200mg /day Sodium < 1500mg /day Fiber: Women over 50 yrs - 21 grams per day  Personal Goals: Personal Goals and Risk Factors at Admission - 04/12/18 1403      Core Components/Risk Factors/Patient Goals on Admission    Weight Management  Yes;Weight Loss    Intervention  Weight Management: Develop a combined nutrition and exercise program designed to reach desired caloric intake, while maintaining appropriate intake of nutrient and fiber, sodium and fats, and appropriate energy expenditure required for the weight goal.;Weight Management: Provide education and appropriate resources to help participant work on and attain dietary goals.;Weight Management/Obesity: Establish reasonable short term and long term weight goals.;Obesity: Provide education and appropriate resources to help participant work on and attain dietary goals.    Admit Weight  180 lb (81.6 kg)    Goal Weight: Short Term  175 lb (79.4 kg)    Goal Weight: Long Term  150 lb (68 kg)    Expected Outcomes  Short Term: Continue to assess and modify interventions until short term weight is achieved;Long Term:  Adherence to nutrition and physical activity/exercise program aimed toward attainment of established weight goal;Weight Loss: Understanding of general recommendations for a balanced deficit meal plan, which promotes 1-2 lb weight loss per week and includes a negative energy balance of [phone] kcal/d;Understanding recommendations for meals to include 15-35% energy as protein, 25-35% energy from fat, 35-60% energy from carbohydrates, less than 200mg  of dietary cholesterol, 20-35 gm of total fiber daily;Understanding of distribution of calorie intake throughout the day with the consumption of 4-5 meals/snacks    Diabetes  Yes    Intervention  Provide education about signs/symptoms and action to take for hypo/hyperglycemia.;Provide education about proper  nutrition, including hydration, and aerobic/resistive exercise prescription along with prescribed medications to achieve blood glucose in normal ranges: Fasting glucose 65-99 mg/dL    Expected Outcomes  Short Term: Participant verbalizes understanding of the signs/symptoms and immediate care of hyper/hypoglycemia, proper foot care and importance of medication, aerobic/resistive exercise and nutrition plan for blood glucose control.;Long Term: Attainment of HbA1C < 7%.    Hypertension  Yes    Intervention  Provide education on lifestyle modifcations including regular physical activity/exercise, weight management, moderate sodium restriction and increased consumption of fresh fruit, vegetables, and low fat dairy, alcohol moderation, and smoking cessation.;Monitor prescription use compliance.    Expected Outcomes  Short Term: Continued assessment and intervention until BP is < 140/48mm HG in hypertensive participants. < 130/85mm HG in hypertensive participants with diabetes, heart failure or chronic kidney disease.;Long Term: Maintenance of blood pressure at goal levels.    Lipids  Yes    Intervention  Provide education and support for participant on nutrition & aerobic/resistive exercise along with prescribed medications to achieve LDL 70mg , HDL >40mg .    Expected Outcomes  Short Term: Participant states understanding of desired cholesterol values and is compliant with medications prescribed. Participant is following exercise prescription and nutrition guidelines.;Long Term: Cholesterol controlled with medications as prescribed, with individualized exercise RX and with personalized nutrition plan. Value goals: LDL < 70mg , HDL > 40 mg.       Tobacco Use Initial Evaluation: Social History   Tobacco Use  Smoking Status Never Smoker  Smokeless Tobacco Never Used    Exercise Goals and Review: Exercise Goals    Row Name 04/12/18 1408             Exercise Goals   Increase Physical Activity  Yes        Intervention  Provide advice, education, support and counseling about physical activity/exercise needs.;Develop an individualized exercise prescription for aerobic and resistive training based on initial evaluation findings, risk stratification, comorbidities and participant's personal goals.       Expected Outcomes  Short Term: Attend rehab on a regular basis to increase amount of physical activity.;Long Term: Exercising regularly at least 3-5 days a week.;Long Term: Add in home exercise to make exercise part of routine and to increase amount of physical activity.       Increase Strength and Stamina  Yes       Intervention  Provide advice, education, support and counseling about physical activity/exercise needs.;Develop an individualized exercise prescription for aerobic and resistive training based on initial evaluation findings, risk stratification, comorbidities and participant's personal goals.       Expected Outcomes  Short Term: Increase workloads from initial exercise prescription for resistance, speed, and METs.;Short Term: Perform resistance training exercises routinely during rehab and add in resistance training at home;Long Term: Improve cardiorespiratory fitness, muscular endurance and strength as measured by increased METs  and functional capacity (6MWT)       Able to understand and use rate of perceived exertion (RPE) scale  Yes       Intervention  Provide education and explanation on how to use RPE scale       Expected Outcomes  Short Term: Able to use RPE daily in rehab to express subjective intensity level;Long Term:  Able to use RPE to guide intensity level when exercising independently       Knowledge and understanding of Target Heart Rate Range (THRR)  Yes       Intervention  Provide education and explanation of THRR including how the numbers were predicted and where they are located for reference       Expected Outcomes  Short Term: Able to state/look up THRR;Short Term: Able to  use daily as guideline for intensity in rehab;Long Term: Able to use THRR to govern intensity when exercising independently       Able to check pulse independently  Yes       Intervention  Provide education and demonstration on how to check pulse in carotid and radial arteries.;Review the importance of being able to check your own pulse for safety during independent exercise       Expected Outcomes  Short Term: Able to explain why pulse checking is important during independent exercise;Long Term: Able to check pulse independently and accurately       Understanding of Exercise Prescription  Yes       Intervention  Provide education, explanation, and written materials on patient's individual exercise prescription       Expected Outcomes  Short Term: Able to explain program exercise prescription;Long Term: Able to explain home exercise prescription to exercise independently          Copy of goals given to participant.

## 2018-04-20 ENCOUNTER — Encounter: Payer: Medicare HMO | Admitting: *Deleted

## 2018-04-20 DIAGNOSIS — I214 Non-ST elevation (NSTEMI) myocardial infarction: Secondary | ICD-10-CM

## 2018-04-20 DIAGNOSIS — Z955 Presence of coronary angioplasty implant and graft: Secondary | ICD-10-CM

## 2018-04-20 LAB — GLUCOSE, CAPILLARY
GLUCOSE-CAPILLARY: 122 mg/dL — AB (ref 70–99)
Glucose-Capillary: 163 mg/dL — ABNORMAL HIGH (ref 70–99)

## 2018-04-20 NOTE — Progress Notes (Signed)
Daily Session Note  Patient Details  Name: Elaine Cox MRN: [id number] Date of Birth: 1945-05-07 Referring Provider:     Cardiac Rehab from 04/12/2018 in [facility] Cardiac and Pulmonary Rehab  Referring Provider  [name] MD      Encounter Date: 04/20/2018  Check In: Session Check In - 04/20/18 8301      Check-In   Supervising physician immediately available to respond to emergencies  See telemetry face sheet for immediately available ER MD    Location  [facility]-Cardiac & Pulmonary Rehab    Staff Present  Heath Lark, RN, BSN, CCRP;Ziyan Hillmer Morris, MA, RCEP, CCRP, Exercise Physiologist;Joseph [employer], [place], ACSM CEP, Exercise Physiologist    Medication changes reported      No    Fall or balance concerns reported     No    Warm-up and Cool-down  Performed as group-led instruction    Resistance Training Performed  Yes    VAD Patient?  No    PAD/SET Patient?  No      Pain Assessment   Currently in Pain?  No/denies          Social History   Tobacco Use  Smoking Status Never Smoker  Smokeless Tobacco Never Used    Goals Met:  Exercise tolerated well Personal goals reviewed No report of cardiac concerns or symptoms Strength training completed today  Goals Unmet:  Not Applicable  Comments: First full day of exercise!  Patient was oriented to gym and equipment including functions, settings, policies, and procedures.  Patient's individual exercise prescription and treatment plan were reviewed.  All starting workloads were established based on the results of the 6 minute walk test done at initial orientation visit.  The plan for exercise progression was also introduced and progression will be customized based on patient's performance and goals.    Dr. Emily Filbert is Medical Director for [facility].

## 2018-04-22 ENCOUNTER — Encounter: Payer: Medicare HMO | Admitting: *Deleted

## 2018-04-22 DIAGNOSIS — M05722 Rheumatoid arthritis with rheumatoid factor of left elbow without organ or systems involvement: Secondary | ICD-10-CM | POA: Diagnosis not present

## 2018-04-22 DIAGNOSIS — M05721 Rheumatoid arthritis with rheumatoid factor of right elbow without organ or systems involvement: Secondary | ICD-10-CM | POA: Diagnosis not present

## 2018-04-22 DIAGNOSIS — I214 Non-ST elevation (NSTEMI) myocardial infarction: Secondary | ICD-10-CM

## 2018-04-22 DIAGNOSIS — Z79899 Other long term (current) drug therapy: Secondary | ICD-10-CM | POA: Diagnosis not present

## 2018-04-22 DIAGNOSIS — M25561 Pain in right knee: Secondary | ICD-10-CM | POA: Diagnosis not present

## 2018-04-22 DIAGNOSIS — Z955 Presence of coronary angioplasty implant and graft: Secondary | ICD-10-CM | POA: Diagnosis not present

## 2018-04-22 DIAGNOSIS — R7611 Nonspecific reaction to tuberculin skin test without active tuberculosis: Secondary | ICD-10-CM | POA: Diagnosis not present

## 2018-04-22 DIAGNOSIS — M150 Primary generalized (osteo)arthritis: Secondary | ICD-10-CM | POA: Diagnosis not present

## 2018-04-22 LAB — GLUCOSE, CAPILLARY
Glucose-Capillary: 187 mg/dL — ABNORMAL HIGH (ref 70–99)
Glucose-Capillary: 94 mg/dL (ref 70–99)

## 2018-04-22 NOTE — Progress Notes (Signed)
Daily Session Note  Patient Details  Name: Elaine Cox MRN: [id number] Date of Birth: 04-Nov-1945 Referring Provider:     Cardiac Rehab from 04/12/2018 in [facility] Cardiac and Pulmonary Rehab  Referring Provider  [name] MD      Encounter Date: 04/22/2018  Check In:      Social History   Tobacco Use  Smoking Status Never Smoker  Smokeless Tobacco Never Used    Goals Met:  Independence with exercise equipment Exercise tolerated well No report of cardiac concerns or symptoms Strength training completed today  Goals Unmet:  Not Applicable  Comments: Pt able to follow exercise prescription today without complaint.  Will continue to monitor for progression.    Dr. Emily Filbert is Medical Director for [facility] and [facility].

## 2018-04-29 ENCOUNTER — Telehealth: Payer: Self-pay

## 2018-04-29 DIAGNOSIS — Z955 Presence of coronary angioplasty implant and graft: Secondary | ICD-10-CM

## 2018-04-29 NOTE — Telephone Encounter (Signed)
Patient states that she was feeling Dizzy at home with a blood pressure of 166/98. She states if she feels better she will be back on Tuesday.

## 2018-05-04 ENCOUNTER — Encounter: Payer: Medicare HMO | Admitting: *Deleted

## 2018-05-04 DIAGNOSIS — I214 Non-ST elevation (NSTEMI) myocardial infarction: Secondary | ICD-10-CM

## 2018-05-04 DIAGNOSIS — Z955 Presence of coronary angioplasty implant and graft: Secondary | ICD-10-CM

## 2018-05-04 LAB — GLUCOSE, CAPILLARY
Glucose-Capillary: 195 mg/dL — ABNORMAL HIGH (ref 70–99)
Glucose-Capillary: 130 mg/dL — ABNORMAL HIGH (ref 70–99)

## 2018-05-04 NOTE — Progress Notes (Signed)
Daily Session Note  Patient Details  Name: Elaine Cox MRN: [id number] Date of Birth: 01/17/1946 Referring Provider:     Cardiac Rehab from 04/12/2018 in [facility] Cardiac and Pulmonary Rehab  Referring Provider  [name] MD      Encounter Date: 05/04/2018  Check In: Session Check In - 05/04/18 0928      Check-In   Supervising physician immediately available to respond to emergencies  See telemetry face sheet for immediately available ER MD    Location  [facility]-Cardiac & Pulmonary Rehab    Staff Present  Jasper Loser BS, Exercise Physiologist;Kishawn Pickar Luan Pulling, MA, RCEP, CCRP, Exercise Physiologist;Amanda Oletta Darter, BA, ACSM CEP, Exercise Physiologist;Krista Frederico Hamman, RN BSN    Medication changes reported      No    Fall or balance concerns reported     No    Warm-up and Cool-down  Performed as group-led Higher education careers adviser Performed  Yes    VAD Patient?  No    PAD/SET Patient?  No      Pain Assessment   Currently in Pain?  No/denies          Social History   Tobacco Use  Smoking Status Never Smoker  Smokeless Tobacco Never Used    Goals Met:  Independence with exercise equipment Exercise tolerated well No report of cardiac concerns or symptoms Strength training completed today  Goals Unmet:  Not Applicable  Comments: Pt able to follow exercise prescription today without complaint.  Will continue to monitor for progression.    Dr. Emily Filbert is Medical Director for [facility] and [facility].

## 2018-05-05 DIAGNOSIS — I214 Non-ST elevation (NSTEMI) myocardial infarction: Secondary | ICD-10-CM

## 2018-05-05 NOTE — Progress Notes (Signed)
Cardiac Individual Treatment Plan  Patient Details  Name: Elaine Cox MRN: [id number] Date of Birth: 28-Oct-1945 Referring Provider:     Cardiac Rehab from 04/12/2018 in [facility] Cardiac and Pulmonary Rehab  Referring Provider  [name] MD      Initial Encounter Date:    Cardiac Rehab from 04/12/2018 in [facility] Cardiac and Pulmonary Rehab  Date  04/12/18      Visit Diagnosis: NSTEMI (non-ST elevated myocardial infarction) [facility])  Patient's Home Medications on Admission:  Current Outpatient Medications:  .  acetaminophen (TYLENOL) 325 MG tablet, Take 2 tablets (650 mg total) by mouth every 6 (six) hours as needed for mild pain (or Fever >/= 101)., Disp: , Rfl:  .  ALPRAZolam (XANAX) 0.25 MG tablet, Take 0.25 mg by mouth at bedtime., Disp: , Rfl:  .  aspirin EC 81 MG tablet, Take 81 mg by mouth daily., Disp: , Rfl:  .  atorvastatin (LIPITOR) 80 MG tablet, Take 1 tablet (80 mg total) by mouth daily at 6 PM., Disp: 30 tablet, Rfl: 1 .  Biotin 800 MCG TABS, Take 1 tablet by mouth daily. , Disp: , Rfl:  .  bisacodyl (DULCOLAX) 5 MG EC tablet, Take 5 mg by mouth daily as needed for moderate constipation., Disp: , Rfl:  .  clopidogrel (PLAVIX) 75 MG tablet, Take 1 tablet (75 mg total) by mouth daily., Disp: 30 tablet, Rfl: 0 .  diphenhydrAMINE (BENADRYL) 25 MG tablet, Take 25 mg by mouth daily as needed for allergies., Disp: , Rfl:  .  hydrOXYzine (ATARAX/VISTARIL) 10 MG tablet, Take 10 mg by mouth daily. , Disp: , Rfl:  .  ibuprofen (ADVIL,MOTRIN) 200 MG tablet, Take 600 mg by mouth 2 (two) times daily as needed for moderate pain., Disp: , Rfl:  .  Menthol-Methyl Salicylate (MUSCLE RUB) 10-15 % CREA, Apply 1 application topically as needed for muscle pain., Disp: , Rfl:  .  metFORMIN (GLUCOPHAGE) 500 MG tablet, Take 500 mg by mouth 2 (two) times daily with a meal., Disp: , Rfl:  .  mupirocin ointment (BACTROBAN) 2 %, Apply 1 application topically 3 (three) times daily., Disp: , Rfl:  0 .  nitroGLYCERIN (NITROSTAT) 0.4 MG SL tablet, Place 0.4 mg under the tongue every 5 (five) minutes as needed for chest pain. , Disp: , Rfl:  .  omeprazole (PRILOSEC) 20 MG capsule, Take 20 mg by mouth daily., Disp: , Rfl:  .  Polyvinyl Alcohol-Povidone (MURINE TEARS FOR DRY EYES OP), Apply 1 drop to eye daily as needed (dry eyes)., Disp: , Rfl:  .  potassium chloride (K-DUR,KLOR-CON) 10 MEQ tablet, Take 10 mEq by mouth 2 (two) times daily. , Disp: , Rfl:  .  Tofacitinib Citrate (XELJANZ) 5 MG TABS, Take 5 mg by mouth 2 (two) times daily. , Disp: , Rfl:   Past Medical History: Past Medical History:  Diagnosis Date  . Anginal pain ([place]) 06/2016   being followed by dr. Ubaldo Glassing  . Anxiety   . Arthritis    Osteoarthritis  . Arthritis    Rheumatoid  . Breast cancer ([place]) 2000   Right Breast - Chemotherapy  . 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  . Coronary artery disease   . Diabetes mellitus without complication [facility])    Patient takes Metformin  . Dyspnea 03/2017  . GERD (gastroesophageal reflux disease)   . History of abdominal hysterectomy   . History of eyelid surgery   . History of kidney stones   .  Hyperlipidemia   . Hypertension   . Lumbago   . Myocardial infarction ([place]) 07/2010  . Orthopnea 03/2017  . Personal history of chemotherapy 2000   right breast ca  . Sinus disorder   . Stroke [facility]) 2006, 2012  . Wears dentures    full upper and lower    Tobacco Use: Social History   Tobacco Use  Smoking Status Never Smoker  Smokeless Tobacco Never Used    Labs: Recent Review Flowsheet Data    Labs for ITP Cardiac and Pulmonary Rehab Latest Ref Rng & Units 02/23/2018   Hemoglobin A1c 4.8 - 5.6 % 6.2(H)       Exercise Target Goals: Exercise Program Goal: Individual exercise prescription set using results from initial 6 min walk test and THRR while considering  patient's activity barriers and safety.   Exercise Prescription Goal: Initial  exercise prescription builds to 30-45 minutes a day of aerobic activity, 2-3 days per week.  Home exercise guidelines will be given to patient during program as part of exercise prescription that the participant will acknowledge.  Activity Barriers & Risk Stratification: Activity Barriers & Cardiac Risk Stratification - 04/12/18 1406      Activity Barriers & Cardiac Risk Stratification   Activity Barriers  Arthritis;Joint Problems;Deconditioning;Muscular Weakness;Balance Concerns;History of Falls   RA in knees   Cardiac Risk Stratification  High       6 Minute Walk: 6 Minute Walk    Row Name 04/12/18 1405         6 Minute Walk   Phase  Initial     Distance  1140 feet     Walk Time  6 minutes     # of Rest Breaks  0     MPH  2.16     METS  2.49     RPE  10     VO2 Peak  8.71     Symptoms  No     Resting HR  66 bpm     Resting BP  132/70     Resting Oxygen Saturation   96 %     Exercise Oxygen Saturation  during 6 min walk  99 %     Max Ex. HR  93 bpm     Max Ex. BP  166/64     2 Minute Post BP  146/66        Oxygen Initial Assessment:   Oxygen Re-Evaluation:   Oxygen Discharge (Final Oxygen Re-Evaluation):   Initial Exercise Prescription: Initial Exercise Prescription - 04/12/18 1400      Date of Initial Exercise RX and Referring Provider   Date  04/12/18    Referring Provider  Paraschos, Alexander MD      NuStep   Level  1    SPM  80    Minutes  15    METs  2      Recumbant Elliptical   Level  1    RPM  50    Minutes  15    METs  2      Track   Laps  30    Minutes  15    METs  2.38      Prescription Details   Frequency (times per week)  2    Duration  Progress to 45 minutes of aerobic exercise without signs/symptoms of physical distress      Intensity   THRR 40-80% of Max Heartrate  99-132    Ratings of Perceived Exertion  11-13    Perceived Dyspnea  0-4      Progression   Progression  Continue to progress workloads to maintain  intensity without signs/symptoms of physical distress.      Resistance Training   Training Prescription  Yes    Weight  3 lbs    Reps  10-15       Perform Capillary Blood Glucose checks as needed.  Exercise Prescription Changes: Exercise Prescription Changes    Row Name 04/12/18 1400 04/27/18 1400           Response to Exercise   Blood Pressure (Admit)  132/70  124/72      Blood Pressure (Exercise)  166/64  128/70      Blood Pressure (Exit)  146/66  126/70      Heart Rate (Admit)  66 bpm  74 bpm      Heart Rate (Exercise)  93 bpm  94 bpm      Heart Rate (Exit)  58 bpm  60 bpm      Oxygen Saturation (Admit)  96 %  -      Oxygen Saturation (Exercise)  99 %  -      Rating of Perceived Exertion (Exercise)  10  13      Symptoms  none  none      Comments  walk test results  second full day of exercise      Duration  -  Continue with 30 min of aerobic exercise without signs/symptoms of physical distress.      Intensity  -  THRR unchanged        Progression   Progression  -  Continue to progress workloads to maintain intensity without signs/symptoms of physical distress.      Average METs  -  1.55        Resistance Training   Training Prescription  -  Yes      Weight  -  3 lbs      Reps  -  10-15        Interval Training   Interval Training  -  No        Treadmill   MPH  -  1.7      Grade  -  0.5      METs  -  2.42        NuStep   Level  -  1      Minutes  -  15      METs  -  1.8        Recumbant Elliptical   Level  -  1      Minutes  -  15      METs  -  1.3        Track   Laps  -  16      Minutes  -  10         Exercise Comments: Exercise Comments    Row Name 04/20/18 2725           Exercise Comments   First full day of exercise!  Patient was oriented to gym and equipment including functions, settings, policies, and procedures.  Patient's individual exercise prescription and treatment plan were reviewed.  All starting workloads were established based  on the results of the 6 minute walk test done at initial orientation visit.  The plan for exercise progression was also introduced and progression will be customized based on patient's performance and goals.  Exercise Goals and Review: Exercise Goals    Row Name 04/12/18 1408             Exercise Goals   Increase Physical Activity  Yes       Intervention  Provide advice, education, support and counseling about physical activity/exercise needs.;Develop an individualized exercise prescription for aerobic and resistive training based on initial evaluation findings, risk stratification, comorbidities and participant's personal goals.       Expected Outcomes  Short Term: Attend rehab on a regular basis to increase amount of physical activity.;Long Term: Exercising regularly at least 3-5 days a week.;Long Term: Add in home exercise to make exercise part of routine and to increase amount of physical activity.       Increase Strength and Stamina  Yes       Intervention  Provide advice, education, support and counseling about physical activity/exercise needs.;Develop an individualized exercise prescription for aerobic and resistive training based on initial evaluation findings, risk stratification, comorbidities and participant's personal goals.       Expected Outcomes  Short Term: Increase workloads from initial exercise prescription for resistance, speed, and METs.;Short Term: Perform resistance training exercises routinely during rehab and add in resistance training at home;Long Term: Improve cardiorespiratory fitness, muscular endurance and strength as measured by increased METs and functional capacity (6MWT)       Able to understand and use rate of perceived exertion (RPE) scale  Yes       Intervention  Provide education and explanation on how to use RPE scale       Expected Outcomes  Short Term: Able to use RPE daily in rehab to express subjective intensity level;Long Term:  Able to use RPE  to guide intensity level when exercising independently       Knowledge and understanding of Target Heart Rate Range (THRR)  Yes       Intervention  Provide education and explanation of THRR including how the numbers were predicted and where they are located for reference       Expected Outcomes  Short Term: Able to state/look up THRR;Short Term: Able to use daily as guideline for intensity in rehab;Long Term: Able to use THRR to govern intensity when exercising independently       Able to check pulse independently  Yes       Intervention  Provide education and demonstration on how to check pulse in carotid and radial arteries.;Review the importance of being able to check your own pulse for safety during independent exercise       Expected Outcomes  Short Term: Able to explain why pulse checking is important during independent exercise;Long Term: Able to check pulse independently and accurately       Understanding of Exercise Prescription  Yes       Intervention  Provide education, explanation, and written materials on patient's individual exercise prescription       Expected Outcomes  Short Term: Able to explain program exercise prescription;Long Term: Able to explain home exercise prescription to exercise independently          Exercise Goals Re-Evaluation : Exercise Goals Re-Evaluation    Row Name 04/20/18 3086 04/27/18 1422           Exercise Goal Re-Evaluation   Exercise Goals Review  Increase Physical Activity;Increase Strength and Stamina;Able to understand and use rate of perceived exertion (RPE) scale;Knowledge and understanding of Target Heart Rate Range (THRR);Understanding of Exercise Prescription  Increase Physical Activity;Increase Strength and Stamina;Understanding  of Exercise Prescription      Comments  Reviewed RPE scale, THR and program prescription with pt today.  Pt voiced understanding and was given a copy of goals to take home.   Elaine Cox has completed her first two full  days of exercise.  She did switch from track to treadmill on her second day.  We will continue to monitor her progress.       Expected Outcomes  Short: Use RPE daily to regulate intensity. Long: Follow program prescription in THR.  Short: Continue to attend regularly.  Long: Continue to follow program prescription.          Discharge Exercise Prescription (Final Exercise Prescription Changes): Exercise Prescription Changes - 04/27/18 1400      Response to Exercise   Blood Pressure (Admit)  124/72    Blood Pressure (Exercise)  128/70    Blood Pressure (Exit)  126/70    Heart Rate (Admit)  74 bpm    Heart Rate (Exercise)  94 bpm    Heart Rate (Exit)  60 bpm    Rating of Perceived Exertion (Exercise)  13    Symptoms  none    Comments  second full day of exercise    Duration  Continue with 30 min of aerobic exercise without signs/symptoms of physical distress.    Intensity  THRR unchanged      Progression   Progression  Continue to progress workloads to maintain intensity without signs/symptoms of physical distress.    Average METs  1.55      Resistance Training   Training Prescription  Yes    Weight  3 lbs    Reps  10-15      Interval Training   Interval Training  No      Treadmill   MPH  1.7    Grade  0.5    METs  2.42      NuStep   Level  1    Minutes  15    METs  1.8      Recumbant Elliptical   Level  1    Minutes  15    METs  1.3      Track   Laps  16    Minutes  10       Nutrition:  Target Goals: Understanding of nutrition guidelines, daily intake of sodium '1500mg'$ , cholesterol '200mg'$ , calories 30% from fat and 7% or less from saturated fats, daily to have 5 or more servings of fruits and vegetables.  Biometrics: Pre Biometrics - 04/12/18 1409      Pre Biometrics   Height  5' 3.6" (1.615 m)    Weight  180 lb (81.6 kg)    Waist Circumference  37.5 inches    Hip Circumference  45 inches    Waist to Hip Ratio  0.83 %    BMI (Calculated)  31.3     Single Leg Stand  4.53 seconds        Nutrition Therapy Plan and Nutrition Goals: Nutrition Therapy & Goals - 04/20/18 1050      Nutrition Therapy   Diet  DM    Drug/Food Interactions  Statins/Certain Fruits    Protein (specify units)  9oz    Fiber  25 grams    Whole Grain Foods  3 servings   eats honey wheat bread and oatmeal   Saturated Fats  11 max. grams    Fruits and Vegetables  5 servings/day   8 ideal; eats more fruits  than vegetables but is working on increasing   Sodium  1500 grams      Personal Nutrition Goals   Nutrition Goal  Add a protien source to your breakfast, such as peanut butter rather than butter on your toast to help better control blood sugars, especially prior to exercise    Personal Goal #2  When you receive a new BG meter, re-start checking your blood sugars at least daily. Goal for readings: <150    Personal Goal #3  Continue to swap out snack foods that have added sugar IE cake for foods with natural sugar/fiber/improved nutritional profile like fruit, great job!    Comments  Pt has started to make changes to her eating habits s/p heart attack. She has been trying to cut down her salt and does not eat fried foods as often. She has cut out most pop and uses Splenda in her iced tea. She is also trying to eat fruits as snacks more often. Last HgbA1c (02/2018) 6.2. She reports 15# wt loss x4 months, intentional loss from exercise and slight decrease in appetite. Wt today 176#. 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 Breakfast: 2 slices honey wheat toast with Smart Balance butter + glass of OJ, or oatmeal. Lunch: grilled chicken sandwich, salad. Dinner: "light" grilled chicken or [place], vegetables, some starches. Eats out most nights for dinner      Intervention Plan   Intervention  Prescribe, educate and counsel regarding individualized specific dietary modifications aiming towards targeted core components such as weight, hypertension, lipid management, diabetes, heart  failure and other comorbidities.    Expected Outcomes  Short Term Goal: Understand basic principles of dietary content, such as calories, fat, sodium, cholesterol and nutrients.;Short Term Goal: A plan has been developed with personal nutrition goals set during dietitian appointment.;Long Term Goal: Adherence to prescribed nutrition plan.       Nutrition Assessments: Nutrition Assessments - 04/12/18 1133      MEDFICTS Scores   Pre Score  21       Nutrition Goals Re-Evaluation: Nutrition Goals Re-Evaluation    [place] Name 04/20/18 1116             Goals   Nutrition Goal  Add a protien source to your breakfast, such as peanut butter rather than butter on your toast to help better control blood sugars, especially prior to exercise       Comment  She ate 2 slices of toast with butter and a glass of OJ for breakfast today and reported her blood sugar reading to be 163 which is high for her       Expected Outcome  Add a protein source to breakfast consistently along with source of complex carbohydrate         Personal Goal #2 Re-Evaluation   Personal Goal #2  When you receive a new BG meter, re-start checking your blood sugars at least daily. Goal for readings: <150         Personal Goal #3 Re-Evaluation   Personal Goal #3  Continue to swap out snack foods that have added sugar IE cake for foods with natural sugar / fiber / improved nutritional profile like fruit, great job!          Nutrition Goals Discharge (Final Nutrition Goals Re-Evaluation): Nutrition Goals Re-Evaluation - 04/20/18 1116      Goals   Nutrition Goal  Add a protien source to your breakfast, such as peanut butter rather than butter on your toast to help better  control blood sugars, especially prior to exercise    Comment  She ate 2 slices of toast with butter and a glass of OJ for breakfast today and reported her blood sugar reading to be 163 which is high for her    Expected Outcome  Add a protein source to  breakfast consistently along with source of complex carbohydrate      Personal Goal #2 Re-Evaluation   Personal Goal #2  When you receive a new BG meter, re-start checking your blood sugars at least daily. Goal for readings: <150      Personal Goal #3 Re-Evaluation   Personal Goal #3  Continue to swap out snack foods that have added sugar IE cake for foods with natural sugar / fiber / improved nutritional profile like fruit, great job!       Psychosocial: Target Goals: Acknowledge presence or absence of significant depression and/or stress, maximize coping skills, provide positive support system. Participant is able to verbalize types and ability to use techniques and skills needed for reducing stress and depression.   Initial Review & Psychosocial Screening: Initial Psych Review & Screening - 04/12/18 1407      Initial Review   Current issues with  Current Sleep Concerns;Current Stress Concerns    Source of Stress Concerns  Unable to perform yard/household activities;Family      Family Dynamics   Good Support System?  Yes   friends     Barriers   Psychosocial barriers to participate in program  There are no identifiable barriers or psychosocial needs.;The patient should benefit from training in stress management and relaxation.      Screening Interventions   Interventions  Encouraged to exercise;Program counselor consult;To provide support and resources with identified psychosocial needs;Provide feedback about the scores to participant    Expected Outcomes  Short Term goal: Utilizing psychosocial counselor, staff and physician to assist with identification of specific Stressors or current issues interfering with healing process. Setting desired goal for each stressor or current issue identified.;Long Term Goal: Stressors or current issues are controlled or eliminated.;Short Term goal: Identification and review with participant of any Quality of Life or Depression concerns found by  scoring the questionnaire.;Long Term goal: The participant improves quality of Life and PHQ9 Scores as seen by post scores and/or verbalization of changes       Quality of Life Scores:  Quality of Life - 04/12/18 1133      Quality of Life   Select  Quality of Life      Quality of Life Scores   Health/Function Pre  17.9 %    Socioeconomic Pre  29 %    Psych/Spiritual Pre  26.57 %    Family Pre  21.3 %    GLOBAL Pre  22.27 %      Scores of 19 and below usually indicate a poorer quality of life in these areas.  A difference of  2-3 points is a clinically meaningful difference.  A difference of 2-3 points in the total score of the Quality of Life Index has been associated with significant improvement in overall quality of life, self-image, physical symptoms, and general health in studies assessing change in quality of life.  PHQ-9: Recent Review Flowsheet Data    Depression screen [facility] 2/9 04/12/2018 03/03/2018   Decreased Interest 1 0   Down, Depressed, Hopeless 1 0   PHQ - 2 Score 2 0   Altered sleeping 1 -   Tired, decreased energy 1 -  Change in appetite 1 -   Feeling bad or failure about yourself  0 -   Trouble concentrating 1 -   Moving slowly or fidgety/restless 1 -   Suicidal thoughts 0 -   PHQ-9 Score 7 -   Difficult doing work/chores Somewhat difficult -     Interpretation of Total Score  Total Score Depression Severity:  1-4 = Minimal depression, 5-9 = Mild depression, 10-14 = Moderate depression, 15-19 = Moderately severe depression, 20-27 = Severe depression   Psychosocial Evaluation and Intervention: Psychosocial Evaluation - 05/04/18 1025      Psychosocial Evaluation & Interventions   Interventions  Stress management education;Encouraged to exercise with the program and follow exercise prescription    Comments  Counselor met with Ms. Crepeau Elaine Cox) today for initial psychosocial evaluation.  She is a 73 year old who had a heart attack and (2) stents inserted  on 02/24/18.  Elaine Cox has a strong support system with a daughter and grandson who live with her.  She also is actively involved in her local church.  Elaine Cox has several other health issues besides her heart condition with diabetes; right knee problems; and Arthritis.  She reports sleeping well and has a good appetite.  Farryn states she has a history of panic and anxiety and is on medication daily for this which helps.  She denies a history of depression or any current symptoms.  Elaine Cox states she "stays tense" often and has had some recent stressors that impact this with a granddaughter who passed at the age of one and her own daughter's health.  Her finances are also stressful for her at this time.  Elaine Cox has goals to be as healthy as possible; to control her anxiety and to regulate her sugar levels.  She will be followed by staff.      Expected Outcomes  Short:  Elaine Cox will exercise to improve her health and for stress reduction as well.  She will benefit from exercise to regulate her blood sugars as well.  Long:  Elaine Cox will develop healthy lifestyle habits to manage her stress and her physical health.      Continue Psychosocial Services   Follow up required by staff       Psychosocial Re-Evaluation:   Psychosocial Discharge (Final Psychosocial Re-Evaluation):   Vocational Rehabilitation: Provide vocational rehab assistance to qualifying candidates.   Vocational Rehab Evaluation & Intervention: Vocational Rehab - 04/12/18 1407      Initial Vocational Rehab Evaluation & Intervention   Assessment shows need for Vocational Rehabilitation  No       Education: Education Goals: Education classes will be provided on a variety of topics geared toward better understanding of heart health and risk factor modification. Participant will state understanding/return demonstration of topics presented as noted by education test scores.  Learning Barriers/Preferences: Learning  Barriers/Preferences - 04/12/18 1405      Learning Barriers/Preferences   Learning Barriers  Sight    Learning Preferences  Individual Instruction       Education Topics:  AED/CPR: - Group verbal and written instruction with the use of models to demonstrate the basic use of the AED with the basic ABC's of resuscitation.   General Nutrition Guidelines/Fats and Fiber: -Group instruction provided by verbal, written material, models and posters to present the general guidelines for heart healthy nutrition. Gives an explanation and review of dietary fats and fiber.   Cardiac Rehab from 05/04/2018 in [facility] Cardiac and Pulmonary Rehab  Date  04/20/18  Educator  LB  Instruction Review Code  1- Verbalizes Understanding      Controlling Sodium/Reading Food Labels: -Group verbal and written material supporting the discussion of sodium use in heart healthy nutrition. Review and explanation with models, verbal and written materials for utilization of the food label.   Exercise Physiology & General Exercise Guidelines: - Group verbal and written instruction with models to review the exercise physiology of the cardiovascular system and associated critical values. Provides general exercise guidelines with specific guidelines to those with heart or lung disease.    Aerobic Exercise & Resistance Training: - Gives group verbal and written instruction on the various components of exercise. Focuses on aerobic and resistive training programs and the benefits of this training and how to safely progress through these programs..   Flexibility, Balance, Mind/Body Relaxation: Provides group verbal/written instruction on the benefits of flexibility and balance training, including mind/body exercise modes such as yoga, pilates and tai chi.  Demonstration and skill practice provided.   Cardiac Rehab from 05/04/2018 in [facility] Cardiac and Pulmonary Rehab  Date  05/04/18  Educator  AS  Instruction Review Code  1-  Verbalizes Understanding      Stress and Anxiety: - Provides group verbal and written instruction about the health risks of elevated stress and causes of high stress.  Discuss the correlation between heart/lung disease and anxiety and treatment options. Review healthy ways to manage with stress and anxiety.   Depression: - Provides group verbal and written instruction on the correlation between heart/lung disease and depressed mood, treatment options, and the stigmas associated with seeking treatment.   Anatomy & Physiology of the Heart: - Group verbal and written instruction and models provide basic cardiac anatomy and physiology, with the coronary electrical and arterial systems. Review of Valvular disease and Heart Failure   Cardiac Procedures: - Group verbal and written instruction to review commonly prescribed medications for heart disease. Reviews the medication, class of the drug, and side effects. Includes the steps to properly store meds and maintain the prescription regimen. (beta blockers and nitrates)   Cardiac Medications I: - Group verbal and written instruction to review commonly prescribed medications for heart disease. Reviews the medication, class of the drug, and side effects. Includes the steps to properly store meds and maintain the prescription regimen.   Cardiac Medications II: -Group verbal and written instruction to review commonly prescribed medications for heart disease. Reviews the medication, class of the drug, and side effects. (all other drug classes)    Go Sex-Intimacy & Heart Disease, Get SMART - Goal Setting: - Group verbal and written instruction through game format to discuss heart disease and the return to sexual intimacy. Provides group verbal and written material to discuss and apply goal setting through the application of the S.M.A.R.T. Method.   Other Matters of the Heart: - Provides group verbal, written materials and models to describe  Stable Angina and Peripheral Artery. Includes description of the disease process and treatment options available to the cardiac patient.   Exercise & Equipment Safety: - Individual verbal instruction and demonstration of equipment use and safety with use of the equipment.   Cardiac Rehab from 05/04/2018 in [facility] Cardiac and Pulmonary Rehab  Date  04/12/18  Educator  Renita Papa, RN  Instruction Review Code  1- Verbalizes Understanding      Infection Prevention: - Provides verbal and written material to individual with discussion of infection control including proper hand washing and proper equipment cleaning during exercise session.   Cardiac  Rehab from 05/04/2018 in [facility] Cardiac and Pulmonary Rehab  Date  04/12/18  Educator  Renita Papa, RN  Instruction Review Code  1- Verbalizes Understanding      Falls Prevention: - Provides verbal and written material to individual with discussion of falls prevention and safety.   Cardiac Rehab from 05/04/2018 in [facility] Cardiac and Pulmonary Rehab  Date  04/12/18  Educator  Renita Papa, RN  Instruction Review Code  1- Verbalizes Understanding      Diabetes: - Individual verbal and written instruction to review signs/symptoms of diabetes, desired ranges of glucose level fasting, after meals and with exercise. Acknowledge that pre and post exercise glucose checks will be done for 3 sessions at entry of program.   Know Your Numbers and Risk Factors: -Group verbal and written instruction about important numbers in your health.  Discussion of what are risk factors and how they play a role in the disease process.  Review of Cholesterol, Blood Pressure, Diabetes, and BMI and the role they play in your overall health.   Sleep Hygiene: -Provides group verbal and written instruction about how sleep can affect your health.  Define sleep hygiene, discuss sleep cycles and impact of sleep habits. Review good sleep hygiene tips.     Other: -Provides group and verbal instruction on various topics (see comments)   Knowledge Questionnaire Score: Knowledge Questionnaire Score - 04/12/18 1406      Knowledge Questionnaire Score   Pre Score  22/26   correct answers reviewed with Elaine Cox. Focus on anatomy, angina, nutrition, exercise      Core Components/Risk Factors/Patient Goals at Admission: Personal Goals and Risk Factors at Admission - 04/12/18 1403      Core Components/Risk Factors/Patient Goals on Admission    Weight Management  Yes;Weight Loss    Intervention  Weight Management: Develop a combined nutrition and exercise program designed to reach desired caloric intake, while maintaining appropriate intake of nutrient and fiber, sodium and fats, and appropriate energy expenditure required for the weight goal.;Weight Management: Provide education and appropriate resources to help participant work on and attain dietary goals.;Weight Management/Obesity: Establish reasonable short term and long term weight goals.;Obesity: Provide education and appropriate resources to help participant work on and attain dietary goals.    Admit Weight  180 lb (81.6 kg)    Goal Weight: Short Term  175 lb (79.4 kg)    Goal Weight: Long Term  150 lb (68 kg)    Expected Outcomes  Short Term: Continue to assess and modify interventions until short term weight is achieved;Long Term: Adherence to nutrition and physical activity/exercise program aimed toward attainment of established weight goal;Weight Loss: Understanding of general recommendations for a balanced deficit meal plan, which promotes 1-2 lb weight loss per week and includes a negative energy balance of [phone] kcal/d;Understanding recommendations for meals to include 15-35% energy as protein, 25-35% energy from fat, 35-60% energy from carbohydrates, less than '200mg'$  of dietary cholesterol, 20-35 gm of total fiber daily;Understanding of distribution of calorie intake throughout the day  with the consumption of 4-5 meals/snacks    Diabetes  Yes    Intervention  Provide education about signs/symptoms and action to take for hypo/hyperglycemia.;Provide education about proper nutrition, including hydration, and aerobic/resistive exercise prescription along with prescribed medications to achieve blood glucose in normal ranges: Fasting glucose 65-99 mg/dL    Expected Outcomes  Short Term: Participant verbalizes understanding of the signs/symptoms and immediate care of hyper/hypoglycemia, proper foot care and importance of medication, aerobic/resistive exercise  and nutrition plan for blood glucose control.;Long Term: Attainment of HbA1C < 7%.    Hypertension  Yes    Intervention  Provide education on lifestyle modifcations including regular physical activity/exercise, weight management, moderate sodium restriction and increased consumption of fresh fruit, vegetables, and low fat dairy, alcohol moderation, and smoking cessation.;Monitor prescription use compliance.    Expected Outcomes  Short Term: Continued assessment and intervention until BP is < 140/24m HG in hypertensive participants. < 130/811mHG in hypertensive participants with diabetes, heart failure or chronic kidney disease.;Long Term: Maintenance of blood pressure at goal levels.    Lipids  Yes    Intervention  Provide education and support for participant on nutrition & aerobic/resistive exercise along with prescribed medications to achieve LDL '70mg'$ , HDL >'40mg'$ .    Expected Outcomes  Short Term: Participant states understanding of desired cholesterol values and is compliant with medications prescribed. Participant is following exercise prescription and nutrition guidelines.;Long Term: Cholesterol controlled with medications as prescribed, with individualized exercise RX and with personalized nutrition plan. Value goals: LDL < '70mg'$ , HDL > 40 mg.       Core Components/Risk Factors/Patient Goals Review:    Core Components/Risk  Factors/Patient Goals at Discharge (Final Review):    ITP Comments: ITP Comments    Row Name 04/12/18 1401 04/29/18 1230 05/05/18 0946       ITP Comments  Med Review completed. Initial ITP created. [facility]  Patient states that she was feeling Dizzy at home with a blood pressure of 166/98. She states if she feels better she will be back on Tuesday.  30 Day Review. Continue with ITP unless directed changes per Medical Director review.        Comments: 30 day review

## 2018-05-06 ENCOUNTER — Encounter: Payer: Medicare HMO | Admitting: *Deleted

## 2018-05-06 DIAGNOSIS — I214 Non-ST elevation (NSTEMI) myocardial infarction: Secondary | ICD-10-CM

## 2018-05-06 DIAGNOSIS — Z955 Presence of coronary angioplasty implant and graft: Secondary | ICD-10-CM

## 2018-05-06 NOTE — Progress Notes (Signed)
Daily Session Note  Patient Details  Name: Elaine Cox MRN: [id number] Date of Birth: 11-16-1945 Referring Provider:     Cardiac Rehab from 04/12/2018 in [facility] Cardiac and Pulmonary Rehab  Referring Provider  [name] MD      Encounter Date: 05/06/2018  Check In: Session Check In - 05/06/18 0919      Check-In   Supervising physician immediately available to respond to emergencies  See telemetry face sheet for immediately available ER MD    Location  [facility]-Cardiac & Pulmonary Rehab    Staff Present  Jasper Loser BS, Exercise Physiologist;Carroll Enterkin, RN, BSN;Anmol Paschen Luan [place], MA, RCEP, CCRP, Exercise Physiologist    Medication changes reported      No    Fall or balance concerns reported     No    Warm-up and Cool-down  Performed as group-led instruction    Resistance Training Performed  Yes    VAD Patient?  No    PAD/SET Patient?  No      Pain Assessment   Currently in Pain?  No/denies          Social History   Tobacco Use  Smoking Status Never Smoker  Smokeless Tobacco Never Used    Goals Met:  Independence with exercise equipment Exercise tolerated well No report of cardiac concerns or symptoms Strength training completed today  Goals Unmet:  Not Applicable  Comments: Pt able to follow exercise prescription today without complaint.  Will continue to monitor for progression.    Dr. Emily Filbert is Medical Director for [place] and [facility].

## 2018-05-11 ENCOUNTER — Encounter: Payer: Medicare HMO | Attending: Cardiology

## 2018-05-11 DIAGNOSIS — Z955 Presence of coronary angioplasty implant and graft: Secondary | ICD-10-CM | POA: Diagnosis not present

## 2018-05-11 DIAGNOSIS — I214 Non-ST elevation (NSTEMI) myocardial infarction: Secondary | ICD-10-CM | POA: Diagnosis not present

## 2018-05-11 NOTE — Progress Notes (Signed)
Daily Session Note  Patient Details  Name: Elaine Cox MRN: [id number] Date of Birth: [birth year] Referring Provider:     Cardiac Rehab from 04/12/2018 in [facility] Cardiac and Pulmonary Rehab  Referring Provider  [name] MD      Encounter Date: 05/11/2018  Check In: Session Check In - 05/11/18 9150      Check-In   Supervising physician immediately available to respond to emergencies  See telemetry face sheet for immediately available ER MD    Location  [facility]-Cardiac & Pulmonary Rehab    Staff Present  Heath Lark, RN, BSN, CCRP;Rushton Early BS, Exercise Physiologist;Jessica [place], [place], RCEP, CCRP, Exercise Physiologist    Medication changes reported      No    Fall or balance concerns reported     No    Tobacco Cessation  No Change    Warm-up and Cool-down  Performed as group-led instruction    Resistance Training Performed  Yes    VAD Patient?  No    PAD/SET Patient?  No      Pain Assessment   Currently in Pain?  No/denies    Multiple Pain Sites  No          Social History   Tobacco Use  Smoking Status Never Smoker  Smokeless Tobacco Never Used    Goals Met:  Independence with exercise equipment Exercise tolerated well No report of cardiac concerns or symptoms Strength training completed today  Goals Unmet:  Not Applicable  Comments: Pt able to follow exercise prescription today without complaint.  Will continue to monitor for progression.    Dr. Emily Filbert is Medical Director for [place] and [facility].

## 2018-05-20 ENCOUNTER — Telehealth: Payer: Self-pay | Admitting: *Deleted

## 2018-05-20 ENCOUNTER — Encounter: Payer: Self-pay | Admitting: *Deleted

## 2018-05-20 DIAGNOSIS — Z955 Presence of coronary angioplasty implant and graft: Secondary | ICD-10-CM

## 2018-05-20 DIAGNOSIS — I214 Non-ST elevation (NSTEMI) myocardial infarction: Secondary | ICD-10-CM

## 2018-05-20 NOTE — Telephone Encounter (Signed)
Called to check on.  She missed the week due to the weather.  She hopes to be back next week.

## 2018-05-25 DIAGNOSIS — Z955 Presence of coronary angioplasty implant and graft: Secondary | ICD-10-CM

## 2018-05-25 DIAGNOSIS — I214 Non-ST elevation (NSTEMI) myocardial infarction: Secondary | ICD-10-CM | POA: Diagnosis not present

## 2018-05-25 NOTE — Progress Notes (Signed)
Daily Session Note  Patient Details  Name: Elaine Cox MRN: [id number] Date of Birth: 01-Feb-1946 Referring Provider:     Cardiac Rehab from 04/12/2018 in [facility] Cardiac and Pulmonary Rehab  Referring Provider  Elaine Cowman MD      Encounter Date: 05/25/2018  Check In: Session Check In - 05/25/18 0938      Check-In   Supervising physician immediately available to respond to emergencies  See telemetry face sheet for immediately available ER MD    Location  [facility]-Cardiac & Pulmonary Rehab    Staff Present  Heath Lark, RN, BSN, CCRP;Jeanna Durrell BS, Exercise Physiologist;Amanda Oletta Darter, BA, ACSM CEP, Exercise Physiologist    Medication changes reported      No    Fall or balance concerns reported     No    Tobacco Cessation  No Change    Warm-up and Cool-down  Performed as group-led instruction    Resistance Training Performed  Yes    VAD Patient?  No    PAD/SET Patient?  No      Pain Assessment   Currently in Pain?  No/denies    Multiple Pain Sites  No          Social History   Tobacco Use  Smoking Status Never Smoker  Smokeless Tobacco Never Used    Goals Met:  Independence with exercise equipment Exercise tolerated well Personal goals reviewed No report of cardiac concerns or symptoms Strength training completed today  Goals Unmet:  Not Applicable  Comments: Pt able to follow exercise prescription today without complaint.  Will continue to monitor for progression.    Dr. Emily Cox is Medical Director for [place] and [facility].

## 2018-05-27 DIAGNOSIS — Z955 Presence of coronary angioplasty implant and graft: Secondary | ICD-10-CM

## 2018-05-27 DIAGNOSIS — I214 Non-ST elevation (NSTEMI) myocardial infarction: Secondary | ICD-10-CM

## 2018-05-27 NOTE — Progress Notes (Signed)
Daily Session Note  Patient Details  Name: Elaine Cox MRN: [id number] Date of Birth: 1946-01-18 Referring Provider:     Cardiac Rehab from 04/12/2018 in [facility] Cardiac and Pulmonary Rehab  Referring Provider  [name] MD      Encounter Date: 05/27/2018  Check In: Session Check In - 05/27/18 1044      Check-In   Supervising physician immediately available to respond to emergencies  See telemetry face sheet for immediately available ER MD    Location  [facility]-Cardiac & Pulmonary Rehab    Staff Present  Gerlene Burdock, RN, BSN;Jeanna Durrell BS, Exercise Physiologist;Jessica [place], MA, RCEP, CCRP, Exercise Physiologist    Medication changes reported      No    Fall or balance concerns reported     No    Tobacco Cessation  No Change    Warm-up and Cool-down  Performed as group-led instruction    Resistance Training Performed  Yes    VAD Patient?  No    PAD/SET Patient?  No      Pain Assessment   Currently in Pain?  No/denies          Social History   Tobacco Use  Smoking Status Never Smoker  Smokeless Tobacco Never Used    Goals Met:  Independence with exercise equipment Exercise tolerated well No report of cardiac concerns or symptoms Strength training completed today  Goals Unmet:  Not Applicable  Comments: Pt able to follow exercise prescription today without complaint.  Will continue to monitor for progression.    Dr. Emily Filbert is Medical Director for [place] and [facility].

## 2018-06-01 ENCOUNTER — Encounter: Payer: Medicare HMO | Admitting: *Deleted

## 2018-06-01 DIAGNOSIS — I214 Non-ST elevation (NSTEMI) myocardial infarction: Secondary | ICD-10-CM

## 2018-06-01 DIAGNOSIS — Z955 Presence of coronary angioplasty implant and graft: Secondary | ICD-10-CM | POA: Diagnosis not present

## 2018-06-01 NOTE — Progress Notes (Signed)
Daily Session Note  Patient Details  Name: Elaine Cox MRN: [id number] Date of Birth: [birth year] Referring Provider:     Cardiac Rehab from 04/12/2018 in [facility] Cardiac and Pulmonary Rehab  Referring Provider  [name] MD      Encounter Date: 06/01/2018  Check In: Session Check In - 06/01/18 0919      Check-In   Supervising physician immediately available to respond to emergencies  See telemetry face sheet for immediately available ER MD    Location  [facility]-Cardiac & Pulmonary Rehab    Staff Present  Heath Lark, RN, BSN, CCRP;Mylin Gignac [place], MA, RCEP, CCRP, Exercise Physiologist;Joseph [employer], [place], ACSM CEP, Exercise Physiologist    Medication changes reported      No    Fall or balance concerns reported     No    Warm-up and Cool-down  Performed as group-led instruction    Resistance Training Performed  Yes    VAD Patient?  No    PAD/SET Patient?  No      Pain Assessment   Currently in Pain?  No/denies          Social History   Tobacco Use  Smoking Status Never Smoker  Smokeless Tobacco Never Used    Goals Met:  Independence with exercise equipment Exercise tolerated well No report of cardiac concerns or symptoms Strength training completed today  Goals Unmet:  Not Applicable  Comments: Pt able to follow exercise prescription today without complaint.  Will continue to monitor for progression.    Dr. Emily Filbert is Medical Director for [place] and [facility].

## 2018-06-02 ENCOUNTER — Encounter: Payer: Self-pay | Admitting: *Deleted

## 2018-06-02 DIAGNOSIS — I214 Non-ST elevation (NSTEMI) myocardial infarction: Secondary | ICD-10-CM

## 2018-06-02 NOTE — Progress Notes (Signed)
Cardiac Individual Treatment Plan  Patient Details  Name: Elaine Cox MRN: [id number] Date of Birth: 27-Oct-1951 Referring Provider:     Cardiac Rehab from 04/12/2018 in [facility] Cardiac and Pulmonary Rehab  Referring Provider  [name] MD      Initial Encounter Date:    Cardiac Rehab from 04/12/2018 in [facility] Cardiac and Pulmonary Rehab  Date  04/12/18      Visit Diagnosis: NSTEMI (non-ST elevated myocardial infarction) [facility])  Patient's Home Medications on Admission:  Current Outpatient Medications:  .  acetaminophen (TYLENOL) 325 MG tablet, Take 2 tablets (650 mg total) by mouth every 6 (six) hours as needed for mild pain (or Fever >/= 101)., Disp: , Rfl:  .  ALPRAZolam (XANAX) 0.25 MG tablet, Take 0.25 mg by mouth at bedtime., Disp: , Rfl:  .  aspirin EC 81 MG tablet, Take 81 mg by mouth daily., Disp: , Rfl:  .  atorvastatin (LIPITOR) 80 MG tablet, Take 1 tablet (80 mg total) by mouth daily at 6 PM., Disp: 30 tablet, Rfl: 1 .  Biotin 800 MCG TABS, Take 1 tablet by mouth daily. , Disp: , Rfl:  .  bisacodyl (DULCOLAX) 5 MG EC tablet, Take 5 mg by mouth daily as needed for moderate constipation., Disp: , Rfl:  .  clopidogrel (PLAVIX) 75 MG tablet, Take 1 tablet (75 mg total) by mouth daily., Disp: 30 tablet, Rfl: 0 .  diphenhydrAMINE (BENADRYL) 25 MG tablet, Take 25 mg by mouth daily as needed for allergies., Disp: , Rfl:  .  hydrOXYzine (ATARAX/VISTARIL) 10 MG tablet, Take 10 mg by mouth daily. , Disp: , Rfl:  .  ibuprofen (ADVIL,MOTRIN) 200 MG tablet, Take 600 mg by mouth 2 (two) times daily as needed for moderate pain., Disp: , Rfl:  .  Menthol-Methyl Salicylate (MUSCLE RUB) 10-15 % CREA, Apply 1 application topically as needed for muscle pain., Disp: , Rfl:  .  metFORMIN (GLUCOPHAGE) 500 MG tablet, Take 500 mg by mouth 2 (two) times daily with a meal., Disp: , Rfl:  .  mupirocin ointment (BACTROBAN) 2 %, Apply 1 application topically 3 (three) times daily., Disp: , Rfl:  0 .  nitroGLYCERIN (NITROSTAT) 0.4 MG SL tablet, Place 0.4 mg under the tongue every 5 (five) minutes as needed for chest pain. , Disp: , Rfl:  .  omeprazole (PRILOSEC) 20 MG capsule, Take 20 mg by mouth daily., Disp: , Rfl:  .  Polyvinyl Alcohol-Povidone (MURINE TEARS FOR DRY EYES OP), Apply 1 drop to eye daily as needed (dry eyes)., Disp: , Rfl:  .  potassium chloride (K-DUR,KLOR-CON) 10 MEQ tablet, Take 10 mEq by mouth 2 (two) times daily. , Disp: , Rfl:  .  Tofacitinib Citrate (XELJANZ) 5 MG TABS, Take 5 mg by mouth 2 (two) times daily. , Disp: , Rfl:   Past Medical History: Past Medical History:  Diagnosis Date  . Anginal pain ([place]) 06/2016   being followed by dr. Ubaldo Glassing  . Anxiety   . Arthritis    Osteoarthritis  . Arthritis    Rheumatoid  . Breast cancer ([place]) 2000   Right Breast - Chemotherapy  . 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  . Coronary artery disease   . Diabetes mellitus without complication [facility])    Patient takes Metformin  . Dyspnea 03/2017  . GERD (gastroesophageal reflux disease)   . History of abdominal hysterectomy   . History of eyelid surgery   . History of kidney stones   .  Hyperlipidemia   . Hypertension   . Lumbago   . Myocardial infarction ([place]) 07/2010  . Orthopnea 03/2017  . Personal history of chemotherapy 2000   right breast ca  . Sinus disorder   . Stroke [facility]) 2006, 2012  . Wears dentures    full upper and lower    Tobacco Use: Social History   Tobacco Use  Smoking Status Never Smoker  Smokeless Tobacco Never Used    Labs: Recent Review Flowsheet Data    Labs for ITP Cardiac and Pulmonary Rehab Latest Ref Rng & Units 02/23/2018   Hemoglobin A1c 4.8 - 5.6 % 6.2(H)       Exercise Target Goals: Exercise Program Goal: Individual exercise prescription set using results from initial 6 min walk test and THRR while considering  patient's activity barriers and safety.   Exercise Prescription Goal: Initial  exercise prescription builds to 30-45 minutes a day of aerobic activity, 2-3 days per week.  Home exercise guidelines will be given to patient during program as part of exercise prescription that the participant will acknowledge.  Activity Barriers & Risk Stratification: Activity Barriers & Cardiac Risk Stratification - 04/12/18 1406      Activity Barriers & Cardiac Risk Stratification   Activity Barriers  Arthritis;Joint Problems;Deconditioning;Muscular Weakness;Balance Concerns;History of Falls   RA in knees   Cardiac Risk Stratification  High       6 Minute Walk: 6 Minute Walk    Row Name 04/12/18 1405         6 Minute Walk   Phase  Initial     Distance  1140 feet     Walk Time  6 minutes     # of Rest Breaks  0     MPH  2.16     METS  2.49     RPE  10     VO2 Peak  8.71     Symptoms  No     Resting HR  66 bpm     Resting BP  132/70     Resting Oxygen Saturation   96 %     Exercise Oxygen Saturation  during 6 min walk  99 %     Max Ex. HR  93 bpm     Max Ex. BP  166/64     2 Minute Post BP  146/66        Oxygen Initial Assessment:   Oxygen Re-Evaluation:   Oxygen Discharge (Final Oxygen Re-Evaluation):   Initial Exercise Prescription: Initial Exercise Prescription - 04/12/18 1400      Date of Initial Exercise RX and Referring Provider   Date  04/12/18    Referring Provider  Paraschos, Alexander MD      NuStep   Level  1    SPM  80    Minutes  15    METs  2      Recumbant Elliptical   Level  1    RPM  50    Minutes  15    METs  2      Track   Laps  30    Minutes  15    METs  2.38      Prescription Details   Frequency (times per week)  2    Duration  Progress to 45 minutes of aerobic exercise without signs/symptoms of physical distress      Intensity   THRR 40-80% of Max Heartrate  99-132    Ratings of Perceived Exertion  11-13    Perceived Dyspnea  0-4      Progression   Progression  Continue to progress workloads to maintain  intensity without signs/symptoms of physical distress.      Resistance Training   Training Prescription  Yes    Weight  3 lbs    Reps  10-15       Perform Capillary Blood Glucose checks as needed.  Exercise Prescription Changes: Exercise Prescription Changes    Row Name 04/12/18 1400 04/27/18 1400 05/11/18 1400 05/26/18 1500       Response to Exercise   Blood Pressure (Admit)  132/70  124/72  106/64  124/70    Blood Pressure (Exercise)  166/64  128/70  130/60  126/56    Blood Pressure (Exit)  146/66  126/70  110/60  116/60    Heart Rate (Admit)  66 bpm  74 bpm  61 bpm  75 bpm    Heart Rate (Exercise)  93 bpm  94 bpm  103 bpm  105 bpm    Heart Rate (Exit)  58 bpm  60 bpm  75 bpm  74 bpm    Oxygen Saturation (Admit)  96 %  -  -  -    Oxygen Saturation (Exercise)  99 %  -  -  -    Rating of Perceived Exertion (Exercise)  _0 Symptoms  none  none  none  none    Comments  walk test results  second full day of exercise  -  -    Duration  -  Continue with 30 min of aerobic exercise without signs/symptoms of physical distress.  Continue with 30 min of aerobic exercise without signs/symptoms of physical distress.  Continue with 30 min of aerobic exercise without signs/symptoms of physical distress.    Intensity  -  THRR unchanged  THRR unchanged  THRR unchanged      Progression   Progression  -  Continue to progress workloads to maintain intensity without signs/symptoms of physical distress.  Continue to progress workloads to maintain intensity without signs/symptoms of physical distress.  Continue to progress workloads to maintain intensity without signs/symptoms of physical distress.    Average METs  -  1.55  1.93  2      Resistance Training   Training Prescription  -  Yes  Yes  Yes    Weight  -  3 lbs  3 lbs  3 lbs    Reps  -  10-15  10-15  10-15      Interval Training   Interval Training  -  No  No  No      Treadmill   MPH  -  1.7  1.8  1.8    Grade  -  0.5  0.5   0.5    Minutes  -  -  -  15    METs  -  2.42  2.5  2.5      NuStep   Level  -  1  1  -    Minutes  -  15  15  -    METs  -  1.8  2  -      Arm Ergometer   Level  -  -  1  1    Minutes  -  -  15  15    METs  -  -  1.3  1.5  Recumbant Elliptical   Level  -  1  -  -    Minutes  -  15  -  -    METs  -  1.3  -  -      Track   Laps  -  16  -  -    Minutes  -  10  -  -      Home Exercise Plan   Plans to continue exercise at  -  -  -  [employer] (comment) [employer] ([employer] possibly)    Frequency  -  -  -  Add 2 additional days to program exercise sessions.    Initial Home Exercises Provided  -  -  -  05/26/18       Exercise Comments: Exercise Comments    Row Name 04/20/18 3383           Exercise Comments   First full day of exercise!  Patient was oriented to gym and equipment including functions, settings, policies, and procedures.  Patient's individual exercise prescription and treatment plan were reviewed.  All starting workloads were established based on the results of the 6 minute walk test done at initial orientation visit.  The plan for exercise progression was also introduced and progression will be customized based on patient's performance and goals.          Exercise Goals and Review: Exercise Goals    Row Name 04/12/18 1408             Exercise Goals   Increase Physical Activity  Yes       Intervention  Provide advice, education, support and counseling about physical activity/exercise needs.;Develop an individualized exercise prescription for aerobic and resistive training based on initial evaluation findings, risk stratification, comorbidities and participant's personal goals.       Expected Outcomes  Short Term: Attend rehab on a regular basis to increase amount of physical activity.;Long Term: Exercising regularly at least 3-5 days a week.;Long Term: Add in home exercise to make exercise part of routine and to increase amount of physical activity.        Increase Strength and Stamina  Yes       Intervention  Provide advice, education, support and counseling about physical activity/exercise needs.;Develop an individualized exercise prescription for aerobic and resistive training based on initial evaluation findings, risk stratification, comorbidities and participant's personal goals.       Expected Outcomes  Short Term: Increase workloads from initial exercise prescription for resistance, speed, and METs.;Short Term: Perform resistance training exercises routinely during rehab and add in resistance training at home;Long Term: Improve cardiorespiratory fitness, muscular endurance and strength as measured by increased METs and functional capacity (6MWT)       Able to understand and use rate of perceived exertion (RPE) scale  Yes       Intervention  Provide education and explanation on how to use RPE scale       Expected Outcomes  Short Term: Able to use RPE daily in rehab to express subjective intensity level;Long Term:  Able to use RPE to guide intensity level when exercising independently       Knowledge and understanding of Target Heart Rate Range (THRR)  Yes       Intervention  Provide education and explanation of THRR including how the numbers were predicted and where they are located for reference       Expected Outcomes  Short Term: Able to state/look up THRR;Short  Term: Able to use daily as guideline for intensity in rehab;Long Term: Able to use THRR to govern intensity when exercising independently       Able to check pulse independently  Yes       Intervention  Provide education and demonstration on how to check pulse in carotid and radial arteries.;Review the importance of being able to check your own pulse for safety during independent exercise       Expected Outcomes  Short Term: Able to explain why pulse checking is important during independent exercise;Long Term: Able to check pulse independently and accurately       Understanding of  Exercise Prescription  Yes       Intervention  Provide education, explanation, and written materials on patient's individual exercise prescription       Expected Outcomes  Short Term: Able to explain program exercise prescription;Long Term: Able to explain home exercise prescription to exercise independently          Exercise Goals Re-Evaluation : Exercise Goals Re-Evaluation    Row Name 04/20/18 7342 04/27/18 1422 05/11/18 1418 05/25/18 1400       Exercise Goal Re-Evaluation   Exercise Goals Review  Increase Physical Activity;Increase Strength and Stamina;Able to understand and use rate of perceived exertion (RPE) scale;Knowledge and understanding of Target Heart Rate Range (THRR);Understanding of Exercise Prescription  Increase Physical Activity;Increase Strength and Stamina;Understanding of Exercise Prescription  Increase Physical Activity;Increase Strength and Stamina;Understanding of Exercise Prescription  Increase Physical Activity;Able to understand and use rate of perceived exertion (RPE) scale;Knowledge and understanding of Target Heart Rate Range (THRR);Understanding of Exercise Prescription;Increase Strength and Stamina    Comments  Reviewed RPE scale, THR and program prescription with pt today.  Pt voiced understanding and was given a copy of goals to take home.   Lenni has completed her first two full days of exercise.  She did switch from track to treadmill on her second day.  We will continue to monitor her progress.   Ersilia has been doing well in rehab. She is doing well on the treadmill.  She has switch to arm crank versus recumbent elliptical.  She should be ready to start to make workload increases.  We will continue to monitor her progress.   Delany has noticed that she can do more at home. She used the [employer] yesterday. She likes to go there sometimes on her off days. She does not wear her lymphedema sleeve during exercise now bc it is not a problem for her right now.      Expected Outcomes  Short: Use RPE daily to regulate intensity. Long: Follow program prescription in THR.  Short: Continue to attend regularly.  Long: Continue to follow program prescription.   Short: Review home exercise and increase workloads.  Long: Continue to improve strength and stamina.   Short: Go to the [facility] or gym at her complex on days off. Long: Graduate from Cardiac Rehab and continue exercise in either [employer] or the [employer].       Discharge Exercise Prescription (Final Exercise Prescription Changes): Exercise Prescription Changes - 05/26/18 1500      Response to Exercise   Blood Pressure (Admit)  124/70    Blood Pressure (Exercise)  126/56    Blood Pressure (Exit)  116/60    Heart Rate (Admit)  75 bpm    Heart Rate (Exercise)  105 bpm    Heart Rate (Exit)  74 bpm    Rating of Perceived Exertion (Exercise)  13    Symptoms  none    Duration  Continue with 30 min of aerobic exercise without signs/symptoms of physical distress.    Intensity  THRR unchanged      Progression   Progression  Continue to progress workloads to maintain intensity without signs/symptoms of physical distress.    Average METs  2      Resistance Training   Training Prescription  Yes    Weight  3 lbs    Reps  10-15      Interval Training   Interval Training  No      Treadmill   MPH  1.8    Grade  0.5    Minutes  15    METs  2.5      Arm Ergometer   Level  1    Minutes  15    METs  1.5      Home Exercise Plan   Plans to continue exercise at  [employer] (comment)   [employer] ([employer] possibly)   Frequency  Add 2 additional days to program exercise sessions.    Initial Home Exercises Provided  05/26/18       Nutrition:  Target Goals: Understanding of nutrition guidelines, daily intake of sodium <1556m, cholesterol <2081m calories 30% from fat and 7% or less from saturated fats, daily to have 5 or more servings of fruits and vegetables.  Biometrics: Pre Biometrics - 04/12/18  1409      Pre Biometrics   Height  5' 3.6" (1.615 m)    Weight  180 lb (81.6 kg)    Waist Circumference  37.5 inches    Hip Circumference  45 inches    Waist to Hip Ratio  0.83 %    BMI (Calculated)  31.3    Single Leg Stand  4.53 seconds        Nutrition Therapy Plan and Nutrition Goals: Nutrition Therapy & Goals - 04/20/18 1050      Nutrition Therapy   Diet  DM    Drug/Food Interactions  Statins/Certain Fruits    Protein (specify units)  9oz    Fiber  25 grams    Whole Grain Foods  3 servings   eats honey wheat bread and oatmeal   Saturated Fats  11 max. grams    Fruits and Vegetables  5 servings/day   8 ideal; eats more fruits than vegetables but is working on increasing   Sodium  1500 grams      Personal Nutrition Goals   Nutrition Goal  Add a protien source to your breakfast, such as peanut butter rather than butter on your toast to help better control blood sugars, especially prior to exercise    Personal Goal #2  When you receive a new BG meter, re-start checking your blood sugars at least daily. Goal for readings: <150    Personal Goal #3  Continue to swap out snack foods that have added sugar IE cake for foods with natural sugar/fiber/improved nutritional profile like fruit, great job!    Comments  Pt has started to make changes to her eating habits s/p heart attack. She has been trying to cut down her salt and does not eat fried foods as often. She has cut out most pop and uses Splenda in her iced tea. She is also trying to eat fruits as snacks more often. Last HgbA1c (02/2018) 6.2. She reports 15# wt loss x4 months, intentional loss from exercise and slight decrease in appetite. Wt today  176#. 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 Breakfast: 2 slices honey wheat toast with Smart Balance butter + glass of OJ, or oatmeal. Lunch: grilled chicken sandwich, salad. Dinner: "light" grilled chicken or [place], vegetables, some starches. Eats out most nights for dinner       Intervention Plan   Intervention  Prescribe, educate and counsel regarding individualized specific dietary modifications aiming towards targeted core components such as weight, hypertension, lipid management, diabetes, heart failure and other comorbidities.    Expected Outcomes  Short Term Goal: Understand basic principles of dietary content, such as calories, fat, sodium, cholesterol and nutrients.;Short Term Goal: A plan has been developed with personal nutrition goals set during dietitian appointment.;Long Term Goal: Adherence to prescribed nutrition plan.       Nutrition Assessments: Nutrition Assessments - 04/12/18 1133      MEDFICTS Scores   Pre Score  21       Nutrition Goals Re-Evaluation: Nutrition Goals Re-Evaluation    [place] Name 04/20/18 1116 05/25/18 1410           Goals   Current Weight  -  171 lb (77.6 kg)      Nutrition Goal  Add a protien source to your breakfast, such as peanut butter rather than butter on your toast to help better control blood sugars, especially prior to exercise  Drinking more water. She has chicken for protein, salads, and collard greens. She eats oatmeal for breakfast. She has cut out soda and uses Mrs. Dash.      Comment  She ate 2 slices of toast with butter and a glass of OJ for breakfast today and reported her blood sugar reading to be 163 which is high for her  -      Expected Outcome  Add a protein source to breakfast consistently along with source of complex carbohydrate  Short: Keep with the healthy choices and add fruit. Long: Continue drinking water and no soda.        Personal Goal #2 Re-Evaluation   Personal Goal #2  When you receive a new BG meter, re-start checking your blood sugars at least daily. Goal for readings: <150  -        Personal Goal #3 Re-Evaluation   Personal Goal #3  Continue to swap out snack foods that have added sugar IE cake for foods with natural sugar / fiber / improved nutritional profile like fruit, great job!   -         Nutrition Goals Discharge (Final Nutrition Goals Re-Evaluation): Nutrition Goals Re-Evaluation - 05/25/18 1410      Goals   Current Weight  171 lb (77.6 kg)    Nutrition Goal  Drinking more water. She has chicken for protein, salads, and collard greens. She eats oatmeal for breakfast. She has cut out soda and uses Mrs. Dash.    Expected Outcome  Short: Keep with the healthy choices and add fruit. Long: Continue drinking water and no soda.       Psychosocial: Target Goals: Acknowledge presence or absence of significant depression and/or stress, maximize coping skills, provide positive support system. Participant is able to verbalize types and ability to use techniques and skills needed for reducing stress and depression.   Initial Review & Psychosocial Screening: Initial Psych Review & Screening - 04/12/18 1407      Initial Review   Current issues with  Current Sleep Concerns;Current Stress Concerns    Source of Stress Concerns  Unable to perform yard/household activities;Family  Family Dynamics   Good Support System?  Yes   friends     Barriers   Psychosocial barriers to participate in program  There are no identifiable barriers or psychosocial needs.;The patient should benefit from training in stress management and relaxation.      Screening Interventions   Interventions  Encouraged to exercise;Program counselor consult;To provide support and resources with identified psychosocial needs;Provide feedback about the scores to participant    Expected Outcomes  Short Term goal: Utilizing psychosocial counselor, staff and physician to assist with identification of specific Stressors or current issues interfering with healing process. Setting desired goal for each stressor or current issue identified.;Long Term Goal: Stressors or current issues are controlled or eliminated.;Short Term goal: Identification and review with participant of any Quality of Life or Depression  concerns found by scoring the questionnaire.;Long Term goal: The participant improves quality of Life and PHQ9 Scores as seen by post scores and/or verbalization of changes       Quality of Life Scores:  Quality of Life - 04/12/18 1133      Quality of Life   Select  Quality of Life      Quality of Life Scores   Health/Function Pre  17.9 %    Socioeconomic Pre  29 %    Psych/Spiritual Pre  26.57 %    Family Pre  21.3 %    GLOBAL Pre  22.27 %      Scores of 19 and below usually indicate a poorer quality of life in these areas.  A difference of  2-3 points is a clinically meaningful difference.  A difference of 2-3 points in the total score of the Quality of Life Index has been associated with significant improvement in overall quality of life, self-image, physical symptoms, and general health in studies assessing change in quality of life.  PHQ-9: Recent Review Flowsheet Data    Depression screen [facility] 2/9 04/12/2018 03/03/2018   Decreased Interest 1 0   Down, Depressed, Hopeless 1 0   PHQ - 2 Score 2 0   Altered sleeping 1 -   Tired, decreased energy 1 -   Change in appetite 1 -   Feeling bad or failure about yourself  0 -   Trouble concentrating 1 -   Moving slowly or fidgety/restless 1 -   Suicidal thoughts 0 -   PHQ-9 Score 7 -   Difficult doing work/chores Somewhat difficult -     Interpretation of Total Score  Total Score Depression Severity:  1-4 = Minimal depression, 5-9 = Mild depression, 10-14 = Moderate depression, 15-19 = Moderately severe depression, 20-27 = Severe depression   Psychosocial Evaluation and Intervention: Psychosocial Evaluation - 05/04/18 1025      Psychosocial Evaluation & Interventions   Interventions  Stress management education;Encouraged to exercise with the program and follow exercise prescription    Comments  Counselor met with Ms. Scarfone Leda Gauze) today for initial psychosocial evaluation.  She is a 73 year old who had a heart attack and  (2) stents inserted on 02/24/18.  Dajana has a strong support system with a daughter and grandson who live with her.  She also is actively involved in her local church.  Mckynlee has several other health issues besides her heart condition with diabetes; right knee problems; and Arthritis.  She reports sleeping well and has a good appetite.  Jacalyn states she has a history of panic and anxiety and is on medication daily for this which helps.  She  denies a history of depression or any current symptoms.  Reilly states she "stays tense" often and has had some recent stressors that impact this with a granddaughter who passed at the age of one and her own daughter's health.  Her finances are also stressful for her at this time.  Verdelle has goals to be as healthy as possible; to control her anxiety and to regulate her sugar levels.  She will be followed by staff.      Expected Outcomes  Short:  [place] will exercise to improve her health and for stress reduction as well.  She will benefit from exercise to regulate her blood sugars as well.  Long:  Sheniqua will develop healthy lifestyle habits to manage her stress and her physical health.      Continue Psychosocial Services   Follow up required by staff       Psychosocial Re-Evaluation: Psychosocial Re-Evaluation    Elmer Name 05/25/18 1406             Psychosocial Re-Evaluation   Current issues with  Current Stress Concerns;Current Sleep Concerns;Current Depression;None Identified       Comments  Ragen reports sleeping well most of the time. She has a good support system. Her daughter lives with her and they take care of each other. She had a  26 year old grandbaby pass away in October 2019 which she thinks contributed to her heart attack bc she was so sad and lost appetitie. She is taking Xanax to help with anxiety. She will seek help with a counselor if she feels depressed.       Expected Outcomes  Short: Aden continue practicing stress management  and relaxation techniques. Long: Keep using her coping strategies and see a counselor when needed.          Psychosocial Discharge (Final Psychosocial Re-Evaluation): Psychosocial Re-Evaluation - 05/25/18 1406      Psychosocial Re-Evaluation   Current issues with  Current Stress Concerns;Current Sleep Concerns;Current Depression;None Identified    Comments  Blue reports sleeping well most of the time. She has a good support system. Her daughter lives with her and they take care of each other. She had a  26 year old grandbaby pass away in October 2019 which she thinks contributed to her heart attack bc she was so sad and lost appetitie. She is taking Xanax to help with anxiety. She will seek help with a counselor if she feels depressed.    Expected Outcomes  Short: Uniqua continue practicing stress management and relaxation techniques. Long: Keep using her coping strategies and see a counselor when needed.       Vocational Rehabilitation: Provide vocational rehab assistance to qualifying candidates.   Vocational Rehab Evaluation & Intervention: Vocational Rehab - 04/12/18 1407      Initial Vocational Rehab Evaluation & Intervention   Assessment shows need for Vocational Rehabilitation  No       Education: Education Goals: Education classes will be provided on a variety of topics geared toward better understanding of heart health and risk factor modification. Participant will state understanding/return demonstration of topics presented as noted by education test scores.  Learning Barriers/Preferences: Learning Barriers/Preferences - 04/12/18 1405      Learning Barriers/Preferences   Learning Barriers  Sight    Learning Preferences  Individual Instruction       Education Topics:  AED/CPR: - Group verbal and written instruction with the use of models to demonstrate the basic use of the AED with the  basic ABC's of resuscitation.   Cardiac Rehab from 06/01/2018 in [facility] Cardiac  and Pulmonary Rehab  Date  06/01/18  Educator  SB  Instruction Review Code  1- Verbalizes Understanding      General Nutrition Guidelines/Fats and Fiber: -Group instruction provided by verbal, written material, models and posters to present the general guidelines for heart healthy nutrition. Gives an explanation and review of dietary fats and fiber.   Cardiac Rehab from 06/01/2018 in [facility] Cardiac and Pulmonary Rehab  Date  04/20/18  Educator  LB  Instruction Review Code  1- Verbalizes Understanding      Controlling Sodium/Reading Food Labels: -Group verbal and written material supporting the discussion of sodium use in heart healthy nutrition. Review and explanation with models, verbal and written materials for utilization of the food label.   Exercise Physiology & General Exercise Guidelines: - Group verbal and written instruction with models to review the exercise physiology of the cardiovascular system and associated critical values. Provides general exercise guidelines with specific guidelines to those with heart or lung disease.    Aerobic Exercise & Resistance Training: - Gives group verbal and written instruction on the various components of exercise. Focuses on aerobic and resistive training programs and the benefits of this training and how to safely progress through these programs..   Flexibility, Balance, Mind/Body Relaxation: Provides group verbal/written instruction on the benefits of flexibility and balance training, including mind/body exercise modes such as yoga, pilates and tai chi.  Demonstration and skill practice provided.   Cardiac Rehab from 06/01/2018 in [facility] Cardiac and Pulmonary Rehab  Date  05/04/18  Educator  AS  Instruction Review Code  1- Verbalizes Understanding      Stress and Anxiety: - Provides group verbal and written instruction about the health risks of elevated stress and causes of high stress.  Discuss the correlation between heart/lung  disease and anxiety and treatment options. Review healthy ways to manage with stress and anxiety.   Cardiac Rehab from 06/01/2018 in [facility] Cardiac and Pulmonary Rehab  Date  05/25/18  Educator  [facility]  Instruction Review Code  1- Verbalizes Understanding      Depression: - Provides group verbal and written instruction on the correlation between heart/lung disease and depressed mood, treatment options, and the stigmas associated with seeking treatment.   Anatomy & Physiology of the Heart: - Group verbal and written instruction and models provide basic cardiac anatomy and physiology, with the coronary electrical and arterial systems. Review of Valvular disease and Heart Failure   Cardiac Procedures: - Group verbal and written instruction to review commonly prescribed medications for heart disease. Reviews the medication, class of the drug, and side effects. Includes the steps to properly store meds and maintain the prescription regimen. (beta blockers and nitrates)   Cardiac Rehab from 06/01/2018 in [facility] Cardiac and Pulmonary Rehab  Date  05/27/18  Educator  CE  Instruction Review Code  1- Verbalizes Understanding      Cardiac Medications I: - Group verbal and written instruction to review commonly prescribed medications for heart disease. Reviews the medication, class of the drug, and side effects. Includes the steps to properly store meds and maintain the prescription regimen.   Cardiac Medications II: -Group verbal and written instruction to review commonly prescribed medications for heart disease. Reviews the medication, class of the drug, and side effects. (all other drug classes)   Cardiac Rehab from 06/01/2018 in [facility] Cardiac and Pulmonary Rehab  Date  05/06/18  Educator  CE  Instruction Review Code  1- Verbalizes Understanding       Go Sex-Intimacy & Heart Disease, Get SMART - Goal Setting: - Group verbal and written instruction through game format to discuss heart disease and  the return to sexual intimacy. Provides group verbal and written material to discuss and apply goal setting through the application of the S.M.A.R.T. Method.   Cardiac Rehab from 06/01/2018 in [facility] Cardiac and Pulmonary Rehab  Date  05/27/18  Educator  CE  Instruction Review Code  1- Verbalizes Understanding      Other Matters of the Heart: - Provides group verbal, written materials and models to describe Stable Angina and Peripheral Artery. Includes description of the disease process and treatment options available to the cardiac patient.   Exercise & Equipment Safety: - Individual verbal instruction and demonstration of equipment use and safety with use of the equipment.   Cardiac Rehab from 06/01/2018 in [facility] Cardiac and Pulmonary Rehab  Date  04/12/18  Educator  Renita Papa, RN  Instruction Review Code  1- Verbalizes Understanding      Infection Prevention: - Provides verbal and written material to individual with discussion of infection control including proper hand washing and proper equipment cleaning during exercise session.   Cardiac Rehab from 06/01/2018 in [facility] Cardiac and Pulmonary Rehab  Date  04/12/18  Educator  Renita Papa, RN  Instruction Review Code  1- Verbalizes Understanding      Falls Prevention: - Provides verbal and written material to individual with discussion of falls prevention and safety.   Cardiac Rehab from 06/01/2018 in [facility] Cardiac and Pulmonary Rehab  Date  04/12/18  Educator  Renita Papa, RN  Instruction Review Code  1- Verbalizes Understanding      Diabetes: - Individual verbal and written instruction to review signs/symptoms of diabetes, desired ranges of glucose level fasting, after meals and with exercise. Acknowledge that pre and post exercise glucose checks will be done for 3 sessions at entry of program.   Know Your Numbers and Risk Factors: -Group verbal and written instruction about important numbers in your health.   Discussion of what are risk factors and how they play a role in the disease process.  Review of Cholesterol, Blood Pressure, Diabetes, and BMI and the role they play in your overall health.   Cardiac Rehab from 06/01/2018 in [facility] Cardiac and Pulmonary Rehab  Date  05/06/18  Educator  CE  Instruction Review Code  1- Verbalizes Understanding      Sleep Hygiene: -Provides group verbal and written instruction about how sleep can affect your health.  Define sleep hygiene, discuss sleep cycles and impact of sleep habits. Review good sleep hygiene tips.    Cardiac Rehab from 06/01/2018 in [facility] Cardiac and Pulmonary Rehab  Date  05/11/18  Educator  [facility]  Instruction Review Code  1- Verbalizes Understanding      Other: -Provides group and verbal instruction on various topics (see comments)   Knowledge Questionnaire Score: Knowledge Questionnaire Score - 04/12/18 1406      Knowledge Questionnaire Score   Pre Score  22/26   correct answers reviewed with Leda Gauze. Focus on anatomy, angina, nutrition, exercise      Core Components/Risk Factors/Patient Goals at Admission: Personal Goals and Risk Factors at Admission - 04/12/18 1403      Core Components/Risk Factors/Patient Goals on Admission    Weight Management  Yes;Weight Loss    Intervention  Weight Management: Develop a combined nutrition and exercise program designed to reach  desired caloric intake, while maintaining appropriate intake of nutrient and fiber, sodium and fats, and appropriate energy expenditure required for the weight goal.;Weight Management: Provide education and appropriate resources to help participant work on and attain dietary goals.;Weight Management/Obesity: Establish reasonable short term and long term weight goals.;Obesity: Provide education and appropriate resources to help participant work on and attain dietary goals.    Admit Weight  180 lb (81.6 kg)    Goal Weight: Short Term  175 lb (79.4 kg)    Goal Weight: Long  Term  150 lb (68 kg)    Expected Outcomes  Short Term: Continue to assess and modify interventions until short term weight is achieved;Long Term: Adherence to nutrition and physical activity/exercise program aimed toward attainment of established weight goal;Weight Loss: Understanding of general recommendations for a balanced deficit meal plan, which promotes 1-2 lb weight loss per week and includes a negative energy balance of [phone] kcal/d;Understanding recommendations for meals to include 15-35% energy as protein, 25-35% energy from fat, 35-60% energy from carbohydrates, less than 232m of dietary cholesterol, 20-35 gm of total fiber daily;Understanding of distribution of calorie intake throughout the day with the consumption of 4-5 meals/snacks    Diabetes  Yes    Intervention  Provide education about signs/symptoms and action to take for hypo/hyperglycemia.;Provide education about proper nutrition, including hydration, and aerobic/resistive exercise prescription along with prescribed medications to achieve blood glucose in normal ranges: Fasting glucose 65-99 mg/dL    Expected Outcomes  Short Term: Participant verbalizes understanding of the signs/symptoms and immediate care of hyper/hypoglycemia, proper foot care and importance of medication, aerobic/resistive exercise and nutrition plan for blood glucose control.;Long Term: Attainment of HbA1C < 7%.    Hypertension  Yes    Intervention  Provide education on lifestyle modifcations including regular physical activity/exercise, weight management, moderate sodium restriction and increased consumption of fresh fruit, vegetables, and low fat dairy, alcohol moderation, and smoking cessation.;Monitor prescription use compliance.    Expected Outcomes  Short Term: Continued assessment and intervention until BP is < 140/962mHG in hypertensive participants. < 130/8067mG in hypertensive participants with diabetes, heart failure or chronic kidney disease.;Long  Term: Maintenance of blood pressure at goal levels.    Lipids  Yes    Intervention  Provide education and support for participant on nutrition & aerobic/resistive exercise along with prescribed medications to achieve LDL <41m6mDL >40mg27m Expected Outcomes  Short Term: Participant states understanding of desired cholesterol values and is compliant with medications prescribed. Participant is following exercise prescription and nutrition guidelines.;Long Term: Cholesterol controlled with medications as prescribed, with individualized exercise RX and with personalized nutrition plan. Value goals: LDL < 41mg,44m > 40 mg.       Core Components/Risk Factors/Patient Goals Review:  Goals and Risk Factor Review    Row Name 05/25/18 1452             Core Components/Risk Factors/Patient Goals Review   Personal Goals Review  Weight Management/Obesity;Lipids;Other;Hypertension;Diabetes       Review  MarilyTamala her blood pressure and checks her blood sugar daily. Her readings this morning were 124/78 and 172 mg/dL. She had some chocolate last night which she thinks is what made the blood glucose a little high but it is usually not that high. She takes her meds as prescirbed. 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 She does not have follow up appointments anymore. She informs people to take her  blood pressure in her left arm to avoid lymphedema. She has blood work every 3 months and takes Lipitor as prescirbed. She has lost 19 lbs. since October.           Core Components/Risk Factors/Patient Goals at Discharge (Final Review):  Goals and Risk Factor Review - 05/25/18 1452      Core Components/Risk Factors/Patient Goals Review   Personal Goals Review  Weight Management/Obesity;Lipids;Other;Hypertension;Diabetes    Review  Gearldean takes her blood pressure and checks her blood sugar daily. Her readings this morning were 124/78 and 172 mg/dL. She had some  chocolate last night which she thinks is what made the blood glucose a little high but it is usually not that high. She takes her meds as prescirbed. 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 She does not have follow up appointments anymore. She informs people to take her blood pressure in her left arm to avoid lymphedema. She has blood work every 3 months and takes Lipitor as prescirbed. She has lost 19 lbs. since October.        ITP Comments: ITP Comments    Row Name 04/12/18 1401 04/29/18 1230 05/05/18 0946 05/20/18 1523 06/02/18 0609   ITP Comments  Med Review completed. Initial ITP created. Diagnosis can be found in [facility] 11/18  Patient states that she was feeling Dizzy at home with a blood pressure of 166/98. She states if she feels better she will be back on Tuesday.  30 Day Review. Continue with ITP unless directed changes per Medical Director review.  Called to check on.  She missed the week due to the weather.  She hopes to be back next week.   30 day review. Continue with ITP unless directed changes by Medical Director chart review.      Comments:

## 2018-06-03 DIAGNOSIS — I214 Non-ST elevation (NSTEMI) myocardial infarction: Secondary | ICD-10-CM

## 2018-06-03 DIAGNOSIS — Z955 Presence of coronary angioplasty implant and graft: Secondary | ICD-10-CM | POA: Diagnosis not present

## 2018-06-03 NOTE — Progress Notes (Signed)
Daily Session Note  Patient Details  Name: Elaine Cox MRN: [id number] Date of Birth: [birth year] Referring Provider:     Cardiac Rehab from 04/12/2018 in [facility] Cardiac and Pulmonary Rehab  Referring Provider  [name] MD      Encounter Date: 06/03/2018  Check In: Session Check In - 06/03/18 0917      Check-In   Supervising physician immediately available to respond to emergencies  See telemetry face sheet for immediately available ER MD    Location  [facility]-Cardiac & Pulmonary Rehab    Staff Present  Justin Mend Lorre Nick, MA, RCEP, CCRP, Exercise Physiologist;Carroll Enterkin, RN, BSN;Jeanna Durrell BS, Exercise Physiologist    Medication changes reported      No    Fall or balance concerns reported     No    Warm-up and Cool-down  Performed as group-led instruction    Resistance Training Performed  Yes    VAD Patient?  No      Pain Assessment   Currently in Pain?  No/denies          Social History   Tobacco Use  Smoking Status Never Smoker  Smokeless Tobacco Never Used    Goals Met:  Independence with exercise equipment Exercise tolerated well No report of cardiac concerns or symptoms Strength training completed today  Goals Unmet:  Not Applicable  Comments: Pt able to follow exercise prescription today without complaint.  Will continue to monitor for progression.    Dr. Emily Filbert is Medical Director for [place] and [facility].

## 2018-06-08 ENCOUNTER — Encounter: Payer: Medicare HMO | Attending: Cardiology

## 2018-06-08 DIAGNOSIS — I214 Non-ST elevation (NSTEMI) myocardial infarction: Secondary | ICD-10-CM | POA: Insufficient documentation

## 2018-06-08 DIAGNOSIS — Z955 Presence of coronary angioplasty implant and graft: Secondary | ICD-10-CM | POA: Insufficient documentation

## 2018-06-10 DIAGNOSIS — I214 Non-ST elevation (NSTEMI) myocardial infarction: Secondary | ICD-10-CM | POA: Diagnosis not present

## 2018-06-10 DIAGNOSIS — Z955 Presence of coronary angioplasty implant and graft: Secondary | ICD-10-CM | POA: Diagnosis not present

## 2018-06-10 NOTE — Progress Notes (Signed)
Daily Session Note  Patient Details  Name: Elaine Cox MRN: [id number] Date of Birth: November 30, 1945 Referring Provider:     Cardiac Rehab from 04/12/2018 in [place] [place] Cardiac and Pulmonary Rehab  Referring Provider  [name] MD      Encounter Date: 06/10/2018  Check In: Session Check In - 06/10/18 0907      Check-In   Supervising physician immediately available to respond to emergencies  See telemetry face sheet for immediately available ER MD    Location  [facility]-Cardiac & Pulmonary Rehab    Staff Present  Justin Mend Lorre Nick, MA, RCEP, CCRP, Exercise Physiologist;Carroll Enterkin, RN, BSN;Jeanna Durrell BS, Exercise Physiologist    Medication changes reported      No    Fall or balance concerns reported     No    Warm-up and Cool-down  Performed as group-led instruction    Resistance Training Performed  Yes    VAD Patient?  No    PAD/SET Patient?  No      Pain Assessment   Currently in Pain?  No/denies          Social History   Tobacco Use  Smoking Status Never Smoker  Smokeless Tobacco Never Used    Goals Met:  Independence with exercise equipment Exercise tolerated well Personal goals reviewed No report of cardiac concerns or symptoms Strength training completed today  Goals Unmet:  Not Applicable  Comments: Pt able to follow exercise prescription today without complaint.  Will continue to monitor for progression.   Dr. Emily Filbert is Medical Director for [place] and [facility].

## 2018-06-15 ENCOUNTER — Encounter: Payer: Medicare HMO | Admitting: *Deleted

## 2018-06-15 DIAGNOSIS — E7849 Other hyperlipidemia: Secondary | ICD-10-CM | POA: Diagnosis not present

## 2018-06-15 DIAGNOSIS — E1165 Type 2 diabetes mellitus with hyperglycemia: Secondary | ICD-10-CM | POA: Diagnosis not present

## 2018-06-15 DIAGNOSIS — I6310 Cerebral infarction due to embolism of unspecified precerebral artery: Secondary | ICD-10-CM | POA: Diagnosis not present

## 2018-06-15 DIAGNOSIS — I10 Essential (primary) hypertension: Secondary | ICD-10-CM | POA: Diagnosis not present

## 2018-06-15 DIAGNOSIS — Z955 Presence of coronary angioplasty implant and graft: Secondary | ICD-10-CM | POA: Diagnosis not present

## 2018-06-15 DIAGNOSIS — I214 Non-ST elevation (NSTEMI) myocardial infarction: Secondary | ICD-10-CM | POA: Diagnosis not present

## 2018-06-15 DIAGNOSIS — I2510 Atherosclerotic heart disease of native coronary artery without angina pectoris: Secondary | ICD-10-CM | POA: Diagnosis not present

## 2018-06-15 NOTE — Progress Notes (Signed)
Daily Session Note  Patient Details  Name: Elaine Cox MRN: [id number] Date of Birth: [birth year] Referring Provider:     Cardiac Rehab from 04/12/2018 in [facility] Cardiac and Pulmonary Rehab  Referring Provider  [name] MD      Encounter Date: 06/15/2018  Check In: Session Check In - 06/15/18 0914      Check-In   Supervising physician immediately available to respond to emergencies  See telemetry face sheet for immediately available ER MD    Location  [facility]-Cardiac & Pulmonary Rehab    Staff Present  Heath Lark, RN, BSN, CCRP;Jeanna Durrell BS, Exercise Physiologist;Amiyrah Lamere Luttrell, MA, RCEP, CCRP, Exercise Physiologist;Joseph [employer], [place], ACSM CEP, Exercise Physiologist    Medication changes reported      No    Fall or balance concerns reported     No    Warm-up and Cool-down  Performed as group-led instruction    Resistance Training Performed  Yes    VAD Patient?  No    PAD/SET Patient?  No          Social History   Tobacco Use  Smoking Status Never Smoker  Smokeless Tobacco Never Used    Goals Met:  Independence with exercise equipment Exercise tolerated well No report of cardiac concerns or symptoms Strength training completed today  Goals Unmet:  Not Applicable  Comments: Pt able to follow exercise prescription today without complaint.  Will continue to monitor for progression.    Dr. Emily Filbert is Medical Director for [place] and [facility].

## 2018-06-17 ENCOUNTER — Other Ambulatory Visit: Payer: Self-pay

## 2018-06-17 DIAGNOSIS — I214 Non-ST elevation (NSTEMI) myocardial infarction: Secondary | ICD-10-CM | POA: Diagnosis not present

## 2018-06-17 DIAGNOSIS — Z955 Presence of coronary angioplasty implant and graft: Secondary | ICD-10-CM | POA: Diagnosis not present

## 2018-06-17 NOTE — Progress Notes (Signed)
Daily Session Note  Patient Details  Name: Elaine Cox MRN: [id number] Date of Birth: May 24, 1945 Referring Provider:     Cardiac Rehab from 04/12/2018 in [facility] Cardiac and Pulmonary Rehab  Referring Provider  [name] MD      Encounter Date: 06/17/2018  Check In: Session Check In - 06/17/18 1226      Check-In   Supervising physician immediately available to respond to emergencies  See telemetry face sheet for immediately available ER MD    Location  [facility]-Cardiac & Pulmonary Rehab    Staff Present  Justin Mend Lorre Nick, MA, RCEP, CCRP, Exercise Physiologist;Susanne Bice, RN, BSN, CCRP    Medication changes reported      No    Fall or balance concerns reported     No    Warm-up and Cool-down  Performed as group-led instruction    Resistance Training Performed  Yes    VAD Patient?  No    PAD/SET Patient?  No      Pain Assessment   Currently in Pain?  No/denies          Social History   Tobacco Use  Smoking Status Never Smoker  Smokeless Tobacco Never Used    Goals Met:  Independence with exercise equipment Exercise tolerated well No report of cardiac concerns or symptoms Strength training completed today  Goals Unmet:  Not Applicable  Comments: Pt able to follow exercise prescription today without complaint.  Will continue to monitor for progression.    Dr. Emily Filbert is Medical Director for [place] and [facility].

## 2018-06-30 ENCOUNTER — Encounter: Payer: Self-pay | Admitting: *Deleted

## 2018-06-30 DIAGNOSIS — I214 Non-ST elevation (NSTEMI) myocardial infarction: Secondary | ICD-10-CM

## 2018-06-30 NOTE — Progress Notes (Signed)
Cardiac Individual Treatment Plan  Patient Details  Name: Elaine Cox MRN: [id number] Date of Birth: 11/18/1969 Referring Provider:     Cardiac Rehab from 04/12/2018 in [facility] Cardiac and Pulmonary Rehab  Referring Provider  [name] MD      Initial Encounter Date:    Cardiac Rehab from 04/12/2018 in [facility] Cardiac and Pulmonary Rehab  Date  04/12/18      Visit Diagnosis: NSTEMI (non-ST elevated myocardial infarction) [facility])  Patient's Home Medications on Admission:  Current Outpatient Medications:  .  acetaminophen (TYLENOL) 325 MG tablet, Take 2 tablets (650 mg total) by mouth every 6 (six) hours as needed for mild pain (or Fever >/= 101)., Disp: , Rfl:  .  ALPRAZolam (XANAX) 0.25 MG tablet, Take 0.25 mg by mouth at bedtime., Disp: , Rfl:  .  aspirin EC 81 MG tablet, Take 81 mg by mouth daily., Disp: , Rfl:  .  atorvastatin (LIPITOR) 80 MG tablet, Take 1 tablet (80 mg total) by mouth daily at 6 PM., Disp: 30 tablet, Rfl: 1 .  Biotin 800 MCG TABS, Take 1 tablet by mouth daily. , Disp: , Rfl:  .  bisacodyl (DULCOLAX) 5 MG EC tablet, Take 5 mg by mouth daily as needed for moderate constipation., Disp: , Rfl:  .  clopidogrel (PLAVIX) 75 MG tablet, Take 1 tablet (75 mg total) by mouth daily., Disp: 30 tablet, Rfl: 0 .  diphenhydrAMINE (BENADRYL) 25 MG tablet, Take 25 mg by mouth daily as needed for allergies., Disp: , Rfl:  .  hydrOXYzine (ATARAX/VISTARIL) 10 MG tablet, Take 10 mg by mouth daily. , Disp: , Rfl:  .  ibuprofen (ADVIL,MOTRIN) 200 MG tablet, Take 600 mg by mouth 2 (two) times daily as needed for moderate pain., Disp: , Rfl:  .  Menthol-Methyl Salicylate (MUSCLE RUB) 10-15 % CREA, Apply 1 application topically as needed for muscle pain., Disp: , Rfl:  .  metFORMIN (GLUCOPHAGE) 500 MG tablet, Take 500 mg by mouth 2 (two) times daily with a meal., Disp: , Rfl:  .  mupirocin ointment (BACTROBAN) 2 %, Apply 1 application topically 3 (three) times daily., Disp: , Rfl:  0 .  nitroGLYCERIN (NITROSTAT) 0.4 MG SL tablet, Place 0.4 mg under the tongue every 5 (five) minutes as needed for chest pain. , Disp: , Rfl:  .  omeprazole (PRILOSEC) 20 MG capsule, Take 20 mg by mouth daily., Disp: , Rfl:  .  Polyvinyl Alcohol-Povidone (MURINE TEARS FOR DRY EYES OP), Apply 1 drop to eye daily as needed (dry eyes)., Disp: , Rfl:  .  potassium chloride (K-DUR,KLOR-CON) 10 MEQ tablet, Take 10 mEq by mouth 2 (two) times daily. , Disp: , Rfl:  .  Tofacitinib Citrate (XELJANZ) 5 MG TABS, Take 5 mg by mouth 2 (two) times daily. , Disp: , Rfl:   Past Medical History: Past Medical History:  Diagnosis Date  . Anginal pain ([employer]) 06/2016   being followed by dr. Ubaldo Glassing  . Anxiety   . Arthritis    Osteoarthritis  . Arthritis    Rheumatoid  . Breast cancer ([place]) 2000   Right Breast - Chemotherapy  . 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  . Coronary artery disease   . Diabetes mellitus without complication [facility])    Patient takes Metformin  . Dyspnea 03/2017  . GERD (gastroesophageal reflux disease)   . History of abdominal hysterectomy   . History of eyelid surgery   . History of kidney stones   .  Hyperlipidemia   . Hypertension   . Lumbago   . Myocardial infarction ([place]) 07/2010  . Orthopnea 03/2017  . Personal history of chemotherapy 2000   right breast ca  . Sinus disorder   . Stroke [facility]) 2006, 2012  . Wears dentures    full upper and lower    Tobacco Use: Social History   Tobacco Use  Smoking Status Never Smoker  Smokeless Tobacco Never Used    Labs: Recent Review Flowsheet Data    Labs for ITP Cardiac and Pulmonary Rehab Latest Ref Rng & Units 02/23/2018   Hemoglobin A1c 4.8 - 5.6 % 6.2(H)       Exercise Target Goals: Exercise Program Goal: Individual exercise prescription set using results from initial 6 min walk test and THRR while considering  patient's activity barriers and safety.   Exercise Prescription Goal: Initial  exercise prescription builds to 30-45 minutes a day of aerobic activity, 2-3 days per week.  Home exercise guidelines will be given to patient during program as part of exercise prescription that the participant will acknowledge.  Activity Barriers & Risk Stratification: Activity Barriers & Cardiac Risk Stratification - 04/12/18 1406      Activity Barriers & Cardiac Risk Stratification   Activity Barriers  Arthritis;Joint Problems;Deconditioning;Muscular Weakness;Balance Concerns;History of Falls   RA in knees   Cardiac Risk Stratification  High       6 Minute Walk: 6 Minute Walk    Row Name 04/12/18 1405         6 Minute Walk   Phase  Initial     Distance  1140 feet     Walk Time  6 minutes     # of Rest Breaks  0     MPH  2.16     METS  2.49     RPE  10     VO2 Peak  8.71     Symptoms  No     Resting HR  66 bpm     Resting BP  132/70     Resting Oxygen Saturation   96 %     Exercise Oxygen Saturation  during 6 min walk  99 %     Max Ex. HR  93 bpm     Max Ex. BP  166/64     2 Minute Post BP  146/66        Oxygen Initial Assessment:   Oxygen Re-Evaluation:   Oxygen Discharge (Final Oxygen Re-Evaluation):   Initial Exercise Prescription: Initial Exercise Prescription - 04/12/18 1400      Date of Initial Exercise RX and Referring Provider   Date  04/12/18    Referring Provider  Paraschos, Alexander MD      NuStep   Level  1    SPM  80    Minutes  15    METs  2      Recumbant Elliptical   Level  1    RPM  50    Minutes  15    METs  2      Track   Laps  30    Minutes  15    METs  2.38      Prescription Details   Frequency (times per week)  2    Duration  Progress to 45 minutes of aerobic exercise without signs/symptoms of physical distress      Intensity   THRR 40-80% of Max Heartrate  99-132    Ratings of Perceived Exertion  11-13    Perceived Dyspnea  0-4      Progression   Progression  Continue to progress workloads to maintain  intensity without signs/symptoms of physical distress.      Resistance Training   Training Prescription  Yes    Weight  3 lbs    Reps  10-15       Perform Capillary Blood Glucose checks as needed.  Exercise Prescription Changes: Exercise Prescription Changes    Row Name 04/12/18 1400 04/27/18 1400 05/11/18 1400 05/26/18 1500 06/09/18 1500     Response to Exercise   Blood Pressure (Admit)  132/70  124/72  106/64  124/70  136/64   Blood Pressure (Exercise)  166/64  128/70  130/60  126/56  140/60   Blood Pressure (Exit)  146/66  126/70  110/60  116/60  112/56   Heart Rate (Admit)  66 bpm  74 bpm  61 bpm  75 bpm  73 bpm   Heart Rate (Exercise)  93 bpm  94 bpm  103 bpm  105 bpm  93 bpm   Heart Rate (Exit)  58 bpm  60 bpm  75 bpm  74 bpm  71 bpm   Oxygen Saturation (Admit)  96 %  -  -  -  -   Oxygen Saturation (Exercise)  99 %  -  -  -  -   Rating of Perceived Exertion (Exercise)  _0 Symptoms  none  none  none  none  none   Comments  walk test results  second full day of exercise  -  -  -   Duration  -  Continue with 30 min of aerobic exercise without signs/symptoms of physical distress.  Continue with 30 min of aerobic exercise without signs/symptoms of physical distress.  Continue with 30 min of aerobic exercise without signs/symptoms of physical distress.  Continue with 30 min of aerobic exercise without signs/symptoms of physical distress.   Intensity  -  THRR unchanged  THRR unchanged  THRR unchanged  THRR unchanged     Progression   Progression  -  Continue to progress workloads to maintain intensity without signs/symptoms of physical distress.  Continue to progress workloads to maintain intensity without signs/symptoms of physical distress.  Continue to progress workloads to maintain intensity without signs/symptoms of physical distress.  Continue to progress workloads to maintain intensity without signs/symptoms of physical distress.   Average METs  -  1.55  1.93   2  2.06     Resistance Training   Training Prescription  -  Yes  Yes  Yes  Yes   Weight  -  3 lbs  3 lbs  3 lbs  3 lbs   Reps  -  10-15  10-15  10-15  10-15     Interval Training   Interval Training  -  No  No  No  No     Treadmill   MPH  -  1.7  1.8  1.8  1.8   Grade  -  0.5  0.5  0.5  0.5   Minutes  -  -  -  15  15   METs  -  2.42  2.5  2.5  2.5     NuStep   Level  -  1  1  -  1   Minutes  -  15  15  -  15   METs  -  1.8  2  -  2     Arm Ergometer   Level  -  -  _0 Minutes  -  -  _1 METs  -  -  1.3  1.5  1.7     Recumbant Elliptical   Level  -  1  -  -  -   Minutes  -  15  -  -  -   METs  -  1.3  -  -  -     Track   Laps  -  16  -  -  -   Minutes  -  10  -  -  -     Home Exercise Plan   Plans to continue exercise at  -  -  -  [employer] (comment) [employer] ([employer] [facility])  [employer] ([facility]) [employer] ([employer] [facility])   Frequency  -  -  -  Add 2 additional days to program exercise sessions.  Add 2 additional days to program exercise sessions.   Initial Home Exercises Provided  -  -  -  05/26/18  05/26/18   Row Name 06/23/18 1000             Response to Exercise   Blood Pressure (Admit)  124/80       Blood Pressure (Exercise)  132/70       Blood Pressure (Exit)  128/64       Heart Rate (Admit)  66 bpm       Heart Rate (Exercise)  104 bpm       Heart Rate (Exit)  63 bpm       Rating of Perceived Exertion (Exercise)  12       Symptoms  none       Duration  Continue with 30 min of aerobic exercise without signs/symptoms of physical distress.       Intensity  THRR unchanged         Progression   Progression  Continue to progress workloads to maintain intensity without signs/symptoms of physical distress.       Average METs  2.24         Resistance Training   Training Prescription  Yes       Weight  3 lbs       Reps  10-15         Interval Training   Interval Training  No         Treadmill   MPH  1.8       Grade  1        Minutes  15       METs  2.63         NuStep   Level  4       Minutes  15       METs  2.1         Biostep-RELP   Level  5       Minutes  15       METs  2         Home Exercise Plan   Plans to continue exercise at  [employer] (comment) [employer] ([employer] possibly)       Frequency  Add 2 additional days to program exercise sessions.       Initial Home Exercises Provided  05/26/18          Exercise Comments: Exercise  Comments    Row Name 04/20/18 2458           Exercise Comments   First full day of exercise!  Patient was oriented to gym and equipment including functions, settings, policies, and procedures.  Patient's individual exercise prescription and treatment plan were reviewed.  All starting workloads were established based on the results of the 6 minute walk test done at initial orientation visit.  The plan for exercise progression was also introduced and progression will be customized based on patient's performance and goals.          Exercise Goals and Review: Exercise Goals    Row Name 04/12/18 1408             Exercise Goals   Increase Physical Activity  Yes       Intervention  Provide advice, education, support and counseling about physical activity/exercise needs.;Develop an individualized exercise prescription for aerobic and resistive training based on initial evaluation findings, risk stratification, comorbidities and participant's personal goals.       Expected Outcomes  Short Term: Attend rehab on a regular basis to increase amount of physical activity.;Long Term: Exercising regularly at least 3-5 days a week.;Long Term: Add in home exercise to make exercise part of routine and to increase amount of physical activity.       Increase Strength and Stamina  Yes       Intervention  Provide advice, education, support and counseling about physical activity/exercise needs.;Develop an individualized exercise prescription for aerobic and resistive training  based on initial evaluation findings, risk stratification, comorbidities and participant's personal goals.       Expected Outcomes  Short Term: Increase workloads from initial exercise prescription for resistance, speed, and METs.;Short Term: Perform resistance training exercises routinely during rehab and add in resistance training at home;Long Term: Improve cardiorespiratory fitness, muscular endurance and strength as measured by increased METs and functional capacity (6MWT)       Able to understand and use rate of perceived exertion (RPE) scale  Yes       Intervention  Provide education and explanation on how to use RPE scale       Expected Outcomes  Short Term: Able to use RPE daily in rehab to express subjective intensity level;Long Term:  Able to use RPE to guide intensity level when exercising independently       Knowledge and understanding of Target Heart Rate Range (THRR)  Yes       Intervention  Provide education and explanation of THRR including how the numbers were predicted and where they are located for reference       Expected Outcomes  Short Term: Able to state/look up THRR;Short Term: Able to use daily as guideline for intensity in rehab;Long Term: Able to use THRR to govern intensity when exercising independently       Able to check pulse independently  Yes       Intervention  Provide education and demonstration on how to check pulse in carotid and radial arteries.;Review the importance of being able to check your own pulse for safety during independent exercise       Expected Outcomes  Short Term: Able to explain why pulse checking is important during independent exercise;Long Term: Able to check pulse independently and accurately       Understanding of Exercise Prescription  Yes       Intervention  Provide education, explanation, and written materials on patient's individual exercise prescription  Expected Outcomes  Short Term: Able to explain program exercise prescription;Long  Term: Able to explain home exercise prescription to exercise independently          Exercise Goals Re-Evaluation : Exercise Goals Re-Evaluation    Row Name 04/20/18 5701 04/27/18 1422 05/11/18 1418 05/25/18 1400 06/09/18 1548     Exercise Goal Re-Evaluation   Exercise Goals Review  Increase Physical Activity;Increase Strength and Stamina;Able to understand and use rate of perceived exertion (RPE) scale;Knowledge and understanding of Target Heart Rate Range (THRR);Understanding of Exercise Prescription  Increase Physical Activity;Increase Strength and Stamina;Understanding of Exercise Prescription  Increase Physical Activity;Increase Strength and Stamina;Understanding of Exercise Prescription  Increase Physical Activity;Able to understand and use rate of perceived exertion (RPE) scale;Knowledge and understanding of Target Heart Rate Range (THRR);Understanding of Exercise Prescription;Increase Strength and Stamina  Increase Physical Activity;Increase Strength and Stamina;Understanding of Exercise Prescription   Comments  Reviewed RPE scale, THR and program prescription with pt today.  Pt voiced understanding and was given a copy of goals to take home.   Bani has completed her first two full days of exercise.  She did switch from track to treadmill on her second day.  We will continue to monitor her progress.   Gracey has been doing well in rehab. She is doing well on the treadmill.  She has switch to arm crank versus recumbent elliptical.  She should be ready to start to make workload increases.  We will continue to monitor her progress.   Oliveah has noticed that she can do more at home. She used the [employer] yesterday. She likes to go there sometimes on her off days. She does not wear her lymphedema sleeve during exercise now bc it is not a problem for her right now.   Richardson Dopp has been doing well in rehab.  She is still at level 1 on the arm crank but still a 13 RPE.  We will still try to increase her  workloads.  We will continue to monitor her progress.    Expected Outcomes  Short: Use RPE daily to regulate intensity. Long: Follow program prescription in THR.  Short: Continue to attend regularly.  Long: Continue to follow program prescription.   Short: Review home exercise and increase workloads.  Long: Continue to improve strength and stamina.   Short: Go to the [facility] or gym at her complex on days off. Long: Graduate from Cardiac Rehab and continue exercise in either [employer] or the [employer].  Short: Increase workloads.  Long: Continue to increase stamina.    [place] Name 06/10/18 1024 06/23/18 1045           Exercise Goal Re-Evaluation   Exercise Goals Review  Increase Physical Activity;Increase Strength and Stamina;Understanding of Exercise Prescription  Increase Physical Activity;Increase Strength and Stamina;Understanding of Exercise Prescription      Comments  Doran Clay has been doing well in rehab.  She has noted that her surgery site from breat cancer pulls some during exercise. We switched to BioStep to help.  She tries to get to exercise everyday and usually will get 30-60 min in at the [facility] on off days. She is also planning to start going to the [employer] class on Friday.  She is getting better with her strength and stamina and is now able to get up and down her stairs better.   Honestee continues do well in rehab.  She is up to level 4 on the NuStep and level 5 on BioStep.  She  will be walking and using our videos at home.  We will continue to monitor her progress at home.       Expected Outcomes  Short: Continue to exercise on off days.  Long: Continue to improve stamina.   Short: Continue to walk at home.  Long: Continue to exercise to build strength and stamina.          Discharge Exercise Prescription (Final Exercise Prescription Changes): Exercise Prescription Changes - 06/23/18 1000      Response to Exercise   Blood Pressure (Admit)  124/80    Blood Pressure (Exercise)  132/70     Blood Pressure (Exit)  128/64    Heart Rate (Admit)  66 bpm    Heart Rate (Exercise)  104 bpm    Heart Rate (Exit)  63 bpm    Rating of Perceived Exertion (Exercise)  12    Symptoms  none    Duration  Continue with 30 min of aerobic exercise without signs/symptoms of physical distress.    Intensity  THRR unchanged      Progression   Progression  Continue to progress workloads to maintain intensity without signs/symptoms of physical distress.    Average METs  2.24      Resistance Training   Training Prescription  Yes    Weight  3 lbs    Reps  10-15      Interval Training   Interval Training  No      Treadmill   MPH  1.8    Grade  1    Minutes  15    METs  2.63      NuStep   Level  4    Minutes  15    METs  2.1      Biostep-RELP   Level  5    Minutes  15    METs  2      Home Exercise Plan   Plans to continue exercise at  [employer] (comment)   [employer] ([employer] [facility])   Frequency  Add 2 additional days to program exercise sessions.    Initial Home Exercises Provided  05/26/18       Nutrition:  Target Goals: Understanding of nutrition guidelines, daily intake of sodium <1532m, cholesterol <2041m calories 30% from fat and 7% or less from saturated fats, daily to have 5 or more servings of fruits and vegetables.  Biometrics: Pre Biometrics - 04/12/18 1409      Pre Biometrics   Height  5' 3.6" (1.615 m)    Weight  180 lb (81.6 kg)    Waist Circumference  37.5 inches    Hip Circumference  45 inches    Waist to Hip Ratio  0.83 %    BMI (Calculated)  31.3    Single Leg Stand  4.53 seconds        Nutrition Therapy Plan and Nutrition Goals: Nutrition Therapy & Goals - 04/20/18 1050      Nutrition Therapy   Diet  DM    Drug/Food Interactions  Statins/Certain Fruits    Protein (specify units)  9oz    Fiber  25 grams    Whole Grain Foods  3 servings   eats honey wheat bread and oatmeal   Saturated Fats  11 max. grams    Fruits and Vegetables   5 servings/day   8 ideal; eats more fruits than vegetables but is working on increasing   Sodium  1500 grams      Personal  Nutrition Goals   Nutrition Goal  Add a protien source to your breakfast, such as peanut butter rather than butter on your toast to help better control blood sugars, especially prior to exercise    Personal Goal #2  When you receive a new BG meter, re-start checking your blood sugars at least daily. Goal for readings: <150    Personal Goal #3  Continue to swap out snack foods that have added sugar IE cake for foods with natural sugar/fiber/improved nutritional profile like fruit, great job!    Comments  Pt has started to make changes to her eating habits s/p heart attack. She has been trying to cut down her salt and does not eat fried foods as often. She has cut out most pop and uses Splenda in her iced tea. She is also trying to eat fruits as snacks more often. Last HgbA1c (02/2018) 6.2. She reports 15# wt loss x4 months, intentional loss from exercise and slight decrease in appetite. Wt today 176#. 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 Breakfast: 2 slices honey wheat toast with Smart Balance butter + glass of OJ, or oatmeal. Lunch: grilled chicken sandwich, salad. Dinner: "light" grilled chicken or [place], vegetables, some starches. Eats out most nights for dinner      Intervention Plan   Intervention  Prescribe, educate and counsel regarding individualized specific dietary modifications aiming towards targeted core components such as weight, hypertension, lipid management, diabetes, heart failure and other comorbidities.    Expected Outcomes  Short Term Goal: Understand basic principles of dietary content, such as calories, fat, sodium, cholesterol and nutrients.;Short Term Goal: A plan has been developed with personal nutrition goals set during dietitian appointment.;Long Term Goal: Adherence to prescribed nutrition plan.       Nutrition Assessments: Nutrition Assessments -  04/12/18 1133      MEDFICTS Scores   Pre Score  21       Nutrition Goals Re-Evaluation: Nutrition Goals Re-Evaluation    Row Name 04/20/18 1116 05/25/18 1410 06/10/18 1037         Goals   Current Weight  -  171 lb (77.6 kg)  -     Nutrition Goal  Add a protien source to your breakfast, such as peanut butter rather than butter on your toast to help better control blood sugars, especially prior to exercise  Drinking more water. She has chicken for protein, salads, and collard greens. She eats oatmeal for breakfast. She has cut out soda and uses Mrs. Dash.  Drink more water, Stay away from sweets, More protein     Comment  She ate 2 slices of toast with butter and a glass of OJ for breakfast today and reported her blood sugar reading to be 163 which is high for her  -  She is getting tired of eating salads and cheats sometimes.  She is going to try some spaghetti squash.  She noted that her daughter is still cooking for salt and so she tries to limit it when she can.  She is tryign to get in more water each day and is up to 4 bottles a day.  She has cut back on her sodas and no longer buys them.  She has cut out cookies and moon pies.  She is trying to get enough protein.     Expected Outcome  Add a protein source to breakfast consistently along with source of complex carbohydrate  Short: Keep with the healthy choices and add fruit. Long: Continue drinking water  and no soda.  Short: Continue to watch sodium.  Long: Continue to work on weight loss with watching her diet.        Personal Goal #2 Re-Evaluation   Personal Goal #2  When you receive a new BG meter, re-start checking your blood sugars at least daily. Goal for readings: <150  -  -       Personal Goal #3 Re-Evaluation   Personal Goal #3  Continue to swap out snack foods that have added sugar IE cake for foods with natural sugar / fiber / improved nutritional profile like fruit, great job!  -  -        Nutrition Goals Discharge (Final  Nutrition Goals Re-Evaluation): Nutrition Goals Re-Evaluation - 06/10/18 1037      Goals   Nutrition Goal  Drink more water, Stay away from sweets, More protein    Comment  She is getting tired of eating salads and cheats sometimes.  She is going to try some spaghetti squash.  She noted that her daughter is still cooking for salt and so she tries to limit it when she can.  She is tryign to get in more water each day and is up to 4 bottles a day.  She has cut back on her sodas and no longer buys them.  She has cut out cookies and moon pies.  She is trying to get enough protein.    Expected Outcome  Short: Continue to watch sodium.  Long: Continue to work on weight loss with watching her diet.        Psychosocial: Target Goals: Acknowledge presence or absence of significant depression and/or stress, maximize coping skills, provide positive support system. Participant is able to verbalize types and ability to use techniques and skills needed for reducing stress and depression.   Initial Review & Psychosocial Screening: Initial Psych Review & Screening - 04/12/18 1407      Initial Review   Current issues with  Current Sleep Concerns;Current Stress Concerns    Source of Stress Concerns  Unable to perform yard/household activities;Family      Family Dynamics   Good Support System?  Yes   friends     Barriers   Psychosocial barriers to participate in program  There are no identifiable barriers or psychosocial needs.;The patient should benefit from training in stress management and relaxation.      Screening Interventions   Interventions  Encouraged to exercise;Program counselor consult;To provide support and resources with identified psychosocial needs;Provide feedback about the scores to participant    Expected Outcomes  Short Term goal: Utilizing psychosocial counselor, staff and physician to assist with identification of specific Stressors or current issues interfering with healing process.  Setting desired goal for each stressor or current issue identified.;Long Term Goal: Stressors or current issues are controlled or eliminated.;Short Term goal: Identification and review with participant of any Quality of Life or Depression concerns found by scoring the questionnaire.;Long Term goal: The participant improves quality of Life and PHQ9 Scores as seen by post scores and/or verbalization of changes       Quality of Life Scores:  Quality of Life - 04/12/18 1133      Quality of Life   Select  Quality of Life      Quality of Life Scores   Health/Function Pre  17.9 %    Socioeconomic Pre  29 %    Psych/Spiritual Pre  26.57 %    Family Pre  21.3 %  GLOBAL Pre  22.27 %      Scores of 19 and below usually indicate a poorer quality of life in these areas.  A difference of  2-3 points is a clinically meaningful difference.  A difference of 2-3 points in the total score of the Quality of Life Index has been associated with significant improvement in overall quality of life, self-image, physical symptoms, and general health in studies assessing change in quality of life.  PHQ-9: Recent Review Flowsheet Data    Depression screen [facility] 2/9 04/12/2018 03/03/2018   Decreased Interest 1 0   Down, Depressed, Hopeless 1 0   PHQ - 2 Score 2 0   Altered sleeping 1 -   Tired, decreased energy 1 -   Change in appetite 1 -   Feeling bad or failure about yourself  0 -   Trouble concentrating 1 -   Moving slowly or fidgety/restless 1 -   Suicidal thoughts 0 -   PHQ-9 Score 7 -   Difficult doing work/chores Somewhat difficult -     Interpretation of Total Score  Total Score Depression Severity:  1-4 = Minimal depression, 5-9 = Mild depression, 10-14 = Moderate depression, 15-19 = Moderately severe depression, 20-27 = Severe depression   Psychosocial Evaluation and Intervention: Psychosocial Evaluation - 05/04/18 1025      Psychosocial Evaluation & Interventions   Interventions  Stress  management education;Encouraged to exercise with the program and follow exercise prescription    Comments  Counselor met with Ms. Lechtenberg Leda Gauze) today for initial psychosocial evaluation.  She is a 73 year old who had a heart attack and (2) stents inserted on 02/24/18.  Dayla has a strong support system with a daughter and grandson who live with her.  She also is actively involved in her local church.  Clovis has several other health issues besides her heart condition with diabetes; right knee problems; and Arthritis.  She reports sleeping well and has a good appetite.  Juanelle states she has a history of panic and anxiety and is on medication daily for this which helps.  She denies a history of depression or any current symptoms.  Kenli states she "stays tense" often and has had some recent stressors that impact this with a granddaughter who passed at the age of one and her own daughter's health.  Her finances are also stressful for her at this time.  Toiya has goals to be as healthy as possible; to control her anxiety and to regulate her sugar levels.  She will be followed by staff.      Expected Outcomes  Short:  Ruble will exercise to improve her health and for stress reduction as well.  She will benefit from exercise to regulate her blood sugars as well.  Long:  Zephaniah will develop healthy lifestyle habits to manage her stress and her physical health.      Continue Psychosocial Services   Follow up required by staff       Psychosocial Re-Evaluation: Psychosocial Re-Evaluation    [place] Name 05/25/18 1406 06/10/18 1026           Psychosocial Re-Evaluation   Current issues with  Current Stress Concerns;Current Sleep Concerns;Current Depression;None Identified  Current Stress Concerns;Current Sleep Concerns;Current Depression;Current Psychotropic Meds;Current Anxiety/Panic      Comments  Lakara reports sleeping well most of the time. She has a good support system. Her daughter lives with  her and they take care of each other. She had a  1  year old grandbaby pass away in October 2019 which she thinks contributed to her heart attack bc she was so sad and lost appetitie. She is taking Xanax to help with anxiety. She will seek help with a counselor if she feels depressed.  Anaria is doing well in rehab.  She is in a good place mentally, but admits to still having her moments.  Her anxiety is getting better .  Her daughter is still under a lot of stress but things are starting to get better which is helping everyones feel better.  She is sleeping good most nights.  She denies any current symptoms of depression.        Expected Outcomes  Short: Natasha continue practicing stress management and relaxation techniques. Long: Keep using her coping strategies and see a counselor when needed.  Short: Continue to exercise to help cope.  Long: Continue to stay positive.          Psychosocial Discharge (Final Psychosocial Re-Evaluation): Psychosocial Re-Evaluation - 06/10/18 1026      Psychosocial Re-Evaluation   Current issues with  Current Stress Concerns;Current Sleep Concerns;Current Depression;Current Psychotropic Meds;Current Anxiety/Panic    Comments  Niharika is doing well in rehab.  She is in a good place mentally, but admits to still having her moments.  Her anxiety is getting better .  Her daughter is still under a lot of stress but things are starting to get better which is helping everyones feel better.  She is sleeping good most nights.  She denies any current symptoms of depression.      Expected Outcomes  Short: Continue to exercise to help cope.  Long: Continue to stay positive.        Vocational Rehabilitation: Provide vocational rehab assistance to qualifying candidates.   Vocational Rehab Evaluation & Intervention: Vocational Rehab - 04/12/18 1407      Initial Vocational Rehab Evaluation & Intervention   Assessment shows need for Vocational Rehabilitation  No        Education: Education Goals: Education classes will be provided on a variety of topics geared toward better understanding of heart health and risk factor modification. Participant will state understanding/return demonstration of topics presented as noted by education test scores.  Learning Barriers/Preferences: Learning Barriers/Preferences - 04/12/18 1405      Learning Barriers/Preferences   Learning Barriers  Sight    Learning Preferences  Individual Instruction       Education Topics:  AED/CPR: - Group verbal and written instruction with the use of models to demonstrate the basic use of the AED with the basic ABC's of resuscitation.   Cardiac Rehab from 06/17/2018 in [facility] Cardiac and Pulmonary Rehab  Date  06/01/18  Educator  SB  Instruction Review Code  1- Verbalizes Understanding      General Nutrition Guidelines/Fats and Fiber: -Group instruction provided by verbal, written material, models and posters to present the general guidelines for heart healthy nutrition. Gives an explanation and review of dietary fats and fiber.   Cardiac Rehab from 06/17/2018 in [facility] Cardiac and Pulmonary Rehab  Date  06/15/18  Educator  [facility]  Instruction Review Code  1- Verbalizes Understanding      Controlling Sodium/Reading Food Labels: -Group verbal and written material supporting the discussion of sodium use in heart healthy nutrition. Review and explanation with models, verbal and written materials for utilization of the food label.   Cardiac Rehab from 06/17/2018 in [facility] Cardiac and Pulmonary Rehab  Date  06/17/18  Educator  [facility]  Instruction Review Code  1- Verbalizes Understanding      Exercise Physiology & General Exercise Guidelines: - Group verbal and written instruction with models to review the exercise physiology of the cardiovascular system and associated critical values. Provides general exercise guidelines with specific guidelines to those with heart or lung disease.     Aerobic Exercise & Resistance Training: - Gives group verbal and written instruction on the various components of exercise. Focuses on aerobic and resistive training programs and the benefits of this training and how to safely progress through these programs..   Flexibility, Balance, Mind/Body Relaxation: Provides group verbal/written instruction on the benefits of flexibility and balance training, including mind/body exercise modes such as yoga, pilates and tai chi.  Demonstration and skill practice provided.   Cardiac Rehab from 06/17/2018 in [facility] Cardiac and Pulmonary Rehab  Date  05/04/18  Educator  AS  Instruction Review Code  1- Verbalizes Understanding      Stress and Anxiety: - Provides group verbal and written instruction about the health risks of elevated stress and causes of high stress.  Discuss the correlation between heart/lung disease and anxiety and treatment options. Review healthy ways to manage with stress and anxiety.   Cardiac Rehab from 06/17/2018 in [facility] Cardiac and Pulmonary Rehab  Date  05/25/18  Educator  [facility]  Instruction Review Code  1- Verbalizes Understanding      Depression: - Provides group verbal and written instruction on the correlation between heart/lung disease and depressed mood, treatment options, and the stigmas associated with seeking treatment.   Anatomy & Physiology of the Heart: - Group verbal and written instruction and models provide basic cardiac anatomy and physiology, with the coronary electrical and arterial systems. Review of Valvular disease and Heart Failure   Cardiac Procedures: - Group verbal and written instruction to review commonly prescribed medications for heart disease. Reviews the medication, class of the drug, and side effects. Includes the steps to properly store meds and maintain the prescription regimen. (beta blockers and nitrates)   Cardiac Rehab from 06/17/2018 in [facility] Cardiac and Pulmonary Rehab  Date  05/27/18   Educator  CE  Instruction Review Code  1- Verbalizes Understanding      Cardiac Medications I: - Group verbal and written instruction to review commonly prescribed medications for heart disease. Reviews the medication, class of the drug, and side effects. Includes the steps to properly store meds and maintain the prescription regimen.   Cardiac Medications II: -Group verbal and written instruction to review commonly prescribed medications for heart disease. Reviews the medication, class of the drug, and side effects. (all other drug classes)   Cardiac Rehab from 06/17/2018 in [facility] Cardiac and Pulmonary Rehab  Date  06/10/18  Educator  CE  Instruction Review Code  1- Verbalizes Understanding       Go Sex-Intimacy & Heart Disease, Get SMART - Goal Setting: - Group verbal and written instruction through game format to discuss heart disease and the return to sexual intimacy. Provides group verbal and written material to discuss and apply goal setting through the application of the S.M.A.R.T. Method.   Cardiac Rehab from 06/17/2018 in [facility] Cardiac and Pulmonary Rehab  Date  05/27/18  Educator  CE  Instruction Review Code  1- Verbalizes Understanding      Other Matters of the Heart: - Provides group verbal, written materials and models to describe Stable Angina and Peripheral Artery. Includes description of the disease process and treatment options available to the cardiac patient.  Exercise & Equipment Safety: - Individual verbal instruction and demonstration of equipment use and safety with use of the equipment.   Cardiac Rehab from 06/17/2018 in [facility] Cardiac and Pulmonary Rehab  Date  04/12/18  Educator  Renita Papa, RN  Instruction Review Code  1- Verbalizes Understanding      Infection Prevention: - Provides verbal and written material to individual with discussion of infection control including proper hand washing and proper equipment cleaning during exercise session.    Cardiac Rehab from 06/17/2018 in [facility] Cardiac and Pulmonary Rehab  Date  04/12/18  Educator  Renita Papa, RN  Instruction Review Code  1- Verbalizes Understanding      Falls Prevention: - Provides verbal and written material to individual with discussion of falls prevention and safety.   Cardiac Rehab from 06/17/2018 in [facility] Cardiac and Pulmonary Rehab  Date  04/12/18  Educator  Renita Papa, RN  Instruction Review Code  1- Verbalizes Understanding      Diabetes: - Individual verbal and written instruction to review signs/symptoms of diabetes, desired ranges of glucose level fasting, after meals and with exercise. Acknowledge that pre and post exercise glucose checks will be done for 3 sessions at entry of program.   Know Your Numbers and Risk Factors: -Group verbal and written instruction about important numbers in your health.  Discussion of what are risk factors and how they play a role in the disease process.  Review of Cholesterol, Blood Pressure, Diabetes, and BMI and the role they play in your overall health.   Cardiac Rehab from 06/17/2018 in [facility] Cardiac and Pulmonary Rehab  Date  06/10/18  Educator  CE  Instruction Review Code  1- Verbalizes Understanding      Sleep Hygiene: -Provides group verbal and written instruction about how sleep can affect your health.  Define sleep hygiene, discuss sleep cycles and impact of sleep habits. Review good sleep hygiene tips.    Cardiac Rehab from 06/17/2018 in [facility] Cardiac and Pulmonary Rehab  Date  05/11/18  Educator  [facility]  Instruction Review Code  1- Verbalizes Understanding      Other: -Provides group and verbal instruction on various topics (see comments)   Knowledge Questionnaire Score: Knowledge Questionnaire Score - 04/12/18 1406      Knowledge Questionnaire Score   Pre Score  22/26   correct answers reviewed with Leda Gauze. Focus on anatomy, angina, nutrition, exercise      Core Components/Risk Factors/Patient  Goals at Admission: Personal Goals and Risk Factors at Admission - 04/12/18 1403      Core Components/Risk Factors/Patient Goals on Admission    Weight Management  Yes;Weight Loss    Intervention  Weight Management: Develop a combined nutrition and exercise program designed to reach desired caloric intake, while maintaining appropriate intake of nutrient and fiber, sodium and fats, and appropriate energy expenditure required for the weight goal.;Weight Management: Provide education and appropriate resources to help participant work on and attain dietary goals.;Weight Management/Obesity: Establish reasonable short term and long term weight goals.;Obesity: Provide education and appropriate resources to help participant work on and attain dietary goals.    Admit Weight  180 lb (81.6 kg)    Goal Weight: Short Term  175 lb (79.4 kg)    Goal Weight: Long Term  150 lb (68 kg)    Expected Outcomes  Short Term: Continue to assess and modify interventions until short term weight is achieved;Long Term: Adherence to nutrition and physical activity/exercise program aimed toward attainment of established weight  goal;Weight Loss: Understanding of general recommendations for a balanced deficit meal plan, which promotes 1-2 lb weight loss per week and includes a negative energy balance of [phone] kcal/d;Understanding recommendations for meals to include 15-35% energy as protein, 25-35% energy from fat, 35-60% energy from carbohydrates, less than 251m of dietary cholesterol, 20-35 gm of total fiber daily;Understanding of distribution of calorie intake throughout the day with the consumption of 4-5 meals/snacks    Diabetes  Yes    Intervention  Provide education about signs/symptoms and action to take for hypo/hyperglycemia.;Provide education about proper nutrition, including hydration, and aerobic/resistive exercise prescription along with prescribed medications to achieve blood glucose in normal ranges: Fasting glucose  65-99 mg/dL    Expected Outcomes  Short Term: Participant verbalizes understanding of the signs/symptoms and immediate care of hyper/hypoglycemia, proper foot care and importance of medication, aerobic/resistive exercise and nutrition plan for blood glucose control.;Long Term: Attainment of HbA1C < 7%.    Hypertension  Yes    Intervention  Provide education on lifestyle modifcations including regular physical activity/exercise, weight management, moderate sodium restriction and increased consumption of fresh fruit, vegetables, and low fat dairy, alcohol moderation, and smoking cessation.;Monitor prescription use compliance.    Expected Outcomes  Short Term: Continued assessment and intervention until BP is < 140/938mHG in hypertensive participants. < 130/8064mG in hypertensive participants with diabetes, heart failure or chronic kidney disease.;Long Term: Maintenance of blood pressure at goal levels.    Lipids  Yes    Intervention  Provide education and support for participant on nutrition & aerobic/resistive exercise along with prescribed medications to achieve LDL <73m68mDL >40mg72m Expected Outcomes  Short Term: Participant states understanding of desired cholesterol values and is compliant with medications prescribed. Participant is following exercise prescription and nutrition guidelines.;Long Term: Cholesterol controlled with medications as prescribed, with individualized exercise RX and with personalized nutrition plan. Value goals: LDL < 73mg,69m > 40 mg.       Core Components/Risk Factors/Patient Goals Review:  Goals and Risk Factor Review    Row Name 05/25/18 1452 06/10/18 1035           Core Components/Risk Factors/Patient Goals Review   Personal Goals Review  Weight Management/Obesity;Lipids;Other;Hypertension;Diabetes  Weight Management/Obesity;Lipids;Hypertension;Diabetes      Review  MarilyBlessings her blood pressure and checks her blood sugar daily. Her readings this morning  were 124/78 and 172 mg/dL. She had some chocolate last night which she thinks is what made the blood glucose a little high but it is usually not that high. She takes her meds as prescirbed. 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 She does not have follow up appointments anymore. She informs people to take her blood pressure in her left arm to avoid lymphedema. She has blood work every 3 months and takes Lipitor as prescirbed. She has lost 19 lbs. since October.   MairlyEphraim Hamburgereen doing well in rehab.  She has been getting out to exercise almost every day.  He weight has been holding steady and she has hit a plateu but continues to work at it.  She is doing well with her blood pressures and running around 130s/70s at home.  Her blood sugars have been around 130s.  She checks both daily.  She is doing well on her medicaitons and is excited about her remission birthday in May.       Expected Outcomes  -  Short: Continue to work on  weight loss. Long: Continue to monitor risk factors.          Core Components/Risk Factors/Patient Goals at Discharge (Final Review):  Goals and Risk Factor Review - 06/10/18 1035      Core Components/Risk Factors/Patient Goals Review   Personal Goals Review  Weight Management/Obesity;Lipids;Hypertension;Diabetes    Review  Ephraim Hamburger has been doing well in rehab.  She has been getting out to exercise almost every day.  He weight has been holding steady and she has hit a plateu but continues to work at it.  She is doing well with her blood pressures and running around 130s/70s at home.  Her blood sugars have been around 130s.  She checks both daily.  She is doing well on her medicaitons and is excited about her remission birthday in May.     Expected Outcomes  Short: Continue to work on weight loss. Long: Continue to monitor risk factors.        ITP Comments: ITP Comments    Row Name 04/12/18 1401 04/29/18 1230 05/05/18 0946 05/20/18 1523  06/02/18 0609   ITP Comments  Med Review completed. Initial ITP created. Diagnosis can be found in [facility] 11/18  Patient states that she was feeling Dizzy at home with a blood pressure of 166/98. She states if she feels better she will be back on Tuesday.  30 Day Review. Continue with ITP unless directed changes per Medical Director review.  Called to check on.  She missed the week due to the weather.  She hopes to be back next week.   30 day review. Continue with ITP unless directed changes by Medical Director chart review.   Leando Name 06/23/18 1045 06/30/18 1215         ITP Comments  Our program is currently closed due to COVID-19.  We are communicating with patient via phone calls and emails.    30 day review. Continue with ITP unless directed changes by Medical Director chart review.         Comments:

## 2018-07-08 DIAGNOSIS — E1165 Type 2 diabetes mellitus with hyperglycemia: Secondary | ICD-10-CM | POA: Diagnosis not present

## 2018-07-08 DIAGNOSIS — I10 Essential (primary) hypertension: Secondary | ICD-10-CM | POA: Diagnosis not present

## 2018-07-08 DIAGNOSIS — Z79899 Other long term (current) drug therapy: Secondary | ICD-10-CM | POA: Diagnosis not present

## 2018-07-08 DIAGNOSIS — E876 Hypokalemia: Secondary | ICD-10-CM | POA: Diagnosis not present

## 2018-07-08 DIAGNOSIS — F419 Anxiety disorder, unspecified: Secondary | ICD-10-CM | POA: Diagnosis not present

## 2018-07-08 DIAGNOSIS — Z09 Encounter for follow-up examination after completed treatment for conditions other than malignant neoplasm: Secondary | ICD-10-CM | POA: Diagnosis not present

## 2018-07-08 DIAGNOSIS — I2510 Atherosclerotic heart disease of native coronary artery without angina pectoris: Secondary | ICD-10-CM | POA: Diagnosis not present

## 2018-07-08 DIAGNOSIS — M059 Rheumatoid arthritis with rheumatoid factor, unspecified: Secondary | ICD-10-CM | POA: Diagnosis not present

## 2018-07-09 ENCOUNTER — Encounter: Payer: Self-pay | Admitting: *Deleted

## 2018-07-09 DIAGNOSIS — I214 Non-ST elevation (NSTEMI) myocardial infarction: Secondary | ICD-10-CM

## 2018-07-09 DIAGNOSIS — Z09 Encounter for follow-up examination after completed treatment for conditions other than malignant neoplasm: Secondary | ICD-10-CM | POA: Diagnosis not present

## 2018-07-09 DIAGNOSIS — Z955 Presence of coronary angioplasty implant and graft: Secondary | ICD-10-CM

## 2018-07-09 DIAGNOSIS — M059 Rheumatoid arthritis with rheumatoid factor, unspecified: Secondary | ICD-10-CM | POA: Diagnosis not present

## 2018-07-09 DIAGNOSIS — I2510 Atherosclerotic heart disease of native coronary artery without angina pectoris: Secondary | ICD-10-CM | POA: Diagnosis not present

## 2018-07-09 DIAGNOSIS — E876 Hypokalemia: Secondary | ICD-10-CM | POA: Diagnosis not present

## 2018-07-09 DIAGNOSIS — I10 Essential (primary) hypertension: Secondary | ICD-10-CM | POA: Diagnosis not present

## 2018-07-09 DIAGNOSIS — E1165 Type 2 diabetes mellitus with hyperglycemia: Secondary | ICD-10-CM | POA: Diagnosis not present

## 2018-07-09 DIAGNOSIS — F419 Anxiety disorder, unspecified: Secondary | ICD-10-CM | POA: Diagnosis not present

## 2018-07-15 DIAGNOSIS — E1165 Type 2 diabetes mellitus with hyperglycemia: Secondary | ICD-10-CM | POA: Diagnosis not present

## 2018-07-15 DIAGNOSIS — I10 Essential (primary) hypertension: Secondary | ICD-10-CM | POA: Diagnosis not present

## 2018-07-15 DIAGNOSIS — Z0000 Encounter for general adult medical examination without abnormal findings: Secondary | ICD-10-CM | POA: Diagnosis not present

## 2018-07-15 DIAGNOSIS — M059 Rheumatoid arthritis with rheumatoid factor, unspecified: Secondary | ICD-10-CM | POA: Diagnosis not present

## 2018-07-15 DIAGNOSIS — R0982 Postnasal drip: Secondary | ICD-10-CM | POA: Diagnosis not present

## 2018-07-15 DIAGNOSIS — K146 Glossodynia: Secondary | ICD-10-CM | POA: Diagnosis not present

## 2018-07-15 DIAGNOSIS — E7849 Other hyperlipidemia: Secondary | ICD-10-CM | POA: Diagnosis not present

## 2018-07-15 DIAGNOSIS — I2510 Atherosclerotic heart disease of native coronary artery without angina pectoris: Secondary | ICD-10-CM | POA: Diagnosis not present

## 2018-07-29 ENCOUNTER — Encounter: Payer: Self-pay | Admitting: *Deleted

## 2018-07-29 DIAGNOSIS — Z955 Presence of coronary angioplasty implant and graft: Secondary | ICD-10-CM

## 2018-07-29 DIAGNOSIS — I214 Non-ST elevation (NSTEMI) myocardial infarction: Secondary | ICD-10-CM

## 2018-08-06 DIAGNOSIS — J019 Acute sinusitis, unspecified: Secondary | ICD-10-CM | POA: Diagnosis not present

## 2018-08-06 DIAGNOSIS — I2510 Atherosclerotic heart disease of native coronary artery without angina pectoris: Secondary | ICD-10-CM | POA: Diagnosis not present

## 2018-08-06 DIAGNOSIS — J069 Acute upper respiratory infection, unspecified: Secondary | ICD-10-CM | POA: Diagnosis not present

## 2018-08-06 DIAGNOSIS — I10 Essential (primary) hypertension: Secondary | ICD-10-CM | POA: Diagnosis not present

## 2018-08-06 DIAGNOSIS — E1165 Type 2 diabetes mellitus with hyperglycemia: Secondary | ICD-10-CM | POA: Diagnosis not present

## 2018-08-06 DIAGNOSIS — M059 Rheumatoid arthritis with rheumatoid factor, unspecified: Secondary | ICD-10-CM | POA: Diagnosis not present

## 2018-08-19 DIAGNOSIS — M8949 Other hypertrophic osteoarthropathy, multiple sites: Secondary | ICD-10-CM | POA: Diagnosis not present

## 2018-08-19 DIAGNOSIS — M059 Rheumatoid arthritis with rheumatoid factor, unspecified: Secondary | ICD-10-CM | POA: Diagnosis not present

## 2018-08-19 DIAGNOSIS — Z79899 Other long term (current) drug therapy: Secondary | ICD-10-CM | POA: Diagnosis not present

## 2018-08-23 ENCOUNTER — Encounter: Payer: Self-pay | Admitting: *Deleted

## 2018-08-23 DIAGNOSIS — Z955 Presence of coronary angioplasty implant and graft: Secondary | ICD-10-CM

## 2018-08-23 DIAGNOSIS — I214 Non-ST elevation (NSTEMI) myocardial infarction: Secondary | ICD-10-CM

## 2018-08-23 NOTE — Progress Notes (Signed)
Cardiac Individual Treatment Plan  Patient Details  Name: Elaine Cox MRN: [id number] Date of Birth: 1945-10-25 Referring Provider:     Cardiac Rehab from 04/12/2018 in [facility] Cardiac and Pulmonary Rehab  Referring Provider  [name] MD      Initial Encounter Date:    Cardiac Rehab from 04/12/2018 in [facility] Cardiac and Pulmonary Rehab  Date  04/12/18      Visit Diagnosis: NSTEMI (non-ST elevated myocardial infarction) [facility])  Status post coronary artery stent placement  Patient's Home Medications on Admission:  Current Outpatient Medications:  .  acetaminophen (TYLENOL) 325 MG tablet, Take 2 tablets (650 mg total) by mouth every 6 (six) hours as needed for mild pain (or Fever >/= 101)., Disp: , Rfl:  .  ALPRAZolam (XANAX) 0.25 MG tablet, Take 0.25 mg by mouth at bedtime., Disp: , Rfl:  .  aspirin EC 81 MG tablet, Take 81 mg by mouth daily., Disp: , Rfl:  .  atorvastatin (LIPITOR) 80 MG tablet, Take 1 tablet (80 mg total) by mouth daily at 6 PM., Disp: 30 tablet, Rfl: 1 .  Biotin 800 MCG TABS, Take 1 tablet by mouth daily. , Disp: , Rfl:  .  bisacodyl (DULCOLAX) 5 MG EC tablet, Take 5 mg by mouth daily as needed for moderate constipation., Disp: , Rfl:  .  clopidogrel (PLAVIX) 75 MG tablet, Take 1 tablet (75 mg total) by mouth daily., Disp: 30 tablet, Rfl: 0 .  diphenhydrAMINE (BENADRYL) 25 MG tablet, Take 25 mg by mouth daily as needed for allergies., Disp: , Rfl:  .  hydrOXYzine (ATARAX/VISTARIL) 10 MG tablet, Take 10 mg by mouth daily. , Disp: , Rfl:  .  ibuprofen (ADVIL,MOTRIN) 200 MG tablet, Take 600 mg by mouth 2 (two) times daily as needed for moderate pain., Disp: , Rfl:  .  Menthol-Methyl Salicylate (MUSCLE RUB) 10-15 % CREA, Apply 1 application topically as needed for muscle pain., Disp: , Rfl:  .  metFORMIN (GLUCOPHAGE) 500 MG tablet, Take 500 mg by mouth 2 (two) times daily with a meal., Disp: , Rfl:  .  mupirocin ointment (BACTROBAN) 2 %, Apply 1 application  topically 3 (three) times daily., Disp: , Rfl: 0 .  nitroGLYCERIN (NITROSTAT) 0.4 MG SL tablet, Place 0.4 mg under the tongue every 5 (five) minutes as needed for chest pain. , Disp: , Rfl:  .  omeprazole (PRILOSEC) 20 MG capsule, Take 20 mg by mouth daily., Disp: , Rfl:  .  Polyvinyl Alcohol-Povidone (MURINE TEARS FOR DRY EYES OP), Apply 1 drop to eye daily as needed (dry eyes)., Disp: , Rfl:  .  potassium chloride (K-DUR,KLOR-CON) 10 MEQ tablet, Take 10 mEq by mouth 2 (two) times daily. , Disp: , Rfl:  .  Tofacitinib Citrate (XELJANZ) 5 MG TABS, Take 5 mg by mouth 2 (two) times daily. , Disp: , Rfl:   Past Medical History: Past Medical History:  Diagnosis Date  . Anginal pain ([place]) 06/2016   being followed by dr. Ubaldo Glassing  . Anxiety   . Arthritis    Osteoarthritis  . Arthritis    Rheumatoid  . Breast cancer ([place]) 2000   Right Breast - Chemotherapy  . 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  . Coronary artery disease   . Diabetes mellitus without complication [facility])    Patient takes Metformin  . Dyspnea 03/2017  . GERD (gastroesophageal reflux disease)   . History of abdominal hysterectomy   . History of eyelid surgery   .  History of kidney stones   . Hyperlipidemia   . Hypertension   . Lumbago   . Myocardial infarction ([place]) 07/2010  . Orthopnea 03/2017  . Personal history of chemotherapy 2000   right breast ca  . Sinus disorder   . Stroke [facility]) 2006, 2012  . Wears dentures    full upper and lower    Tobacco Use: Social History   Tobacco Use  Smoking Status Never Smoker  Smokeless Tobacco Never Used    Labs: Recent Review Flowsheet Data    Labs for ITP Cardiac and Pulmonary Rehab Latest Ref Rng & Units 02/23/2018   Hemoglobin A1c 4.8 - 5.6 % 6.2(H)       Exercise Target Goals: Exercise Program Goal: Individual exercise prescription set using results from initial 6 min walk test and THRR while considering  patient's activity barriers and  safety.   Exercise Prescription Goal: Initial exercise prescription builds to 30-45 minutes a day of aerobic activity, 2-3 days per week.  Home exercise guidelines will be given to patient during program as part of exercise prescription that the participant will acknowledge.  Activity Barriers & Risk Stratification: Activity Barriers & Cardiac Risk Stratification - 04/12/18 1406      Activity Barriers & Cardiac Risk Stratification   Activity Barriers  Arthritis;Joint Problems;Deconditioning;Muscular Weakness;Balance Concerns;History of Falls   RA in knees   Cardiac Risk Stratification  High       6 Minute Walk: 6 Minute Walk    Row Name 04/12/18 1405         6 Minute Walk   Phase  Initial     Distance  1140 feet     Walk Time  6 minutes     # of Rest Breaks  0     MPH  2.16     METS  2.49     RPE  10     VO2 Peak  8.71     Symptoms  No     Resting HR  66 bpm     Resting BP  132/70     Resting Oxygen Saturation   96 %     Exercise Oxygen Saturation  during 6 min walk  99 %     Max Ex. HR  93 bpm     Max Ex. BP  166/64     2 Minute Post BP  146/66        Oxygen Initial Assessment:   Oxygen Re-Evaluation:   Oxygen Discharge (Final Oxygen Re-Evaluation):   Initial Exercise Prescription: Initial Exercise Prescription - 04/12/18 1400      Date of Initial Exercise RX and Referring Provider   Date  04/12/18    Referring Provider  Paraschos, Alexander MD      NuStep   Level  1    SPM  80    Minutes  15    METs  2      Recumbant Elliptical   Level  1    RPM  50    Minutes  15    METs  2      Track   Laps  30    Minutes  15    METs  2.38      Prescription Details   Frequency (times per week)  2    Duration  Progress to 45 minutes of aerobic exercise without signs/symptoms of physical distress      Intensity   THRR 40-80% of Max Heartrate  99-132  Ratings of Perceived Exertion  11-13    Perceived Dyspnea  0-4      Progression   Progression   Continue to progress workloads to maintain intensity without signs/symptoms of physical distress.      Resistance Training   Training Prescription  Yes    Weight  3 lbs    Reps  10-15       Perform Capillary Blood Glucose checks as needed.  Exercise Prescription Changes: Exercise Prescription Changes    Row Name 04/12/18 1400 04/27/18 1400 05/11/18 1400 05/26/18 1500 06/09/18 1500     Response to Exercise   Blood Pressure (Admit)  132/70  124/72  106/64  124/70  136/64   Blood Pressure (Exercise)  166/64  128/70  130/60  126/56  140/60   Blood Pressure (Exit)  146/66  126/70  110/60  116/60  112/56   Heart Rate (Admit)  66 bpm  74 bpm  61 bpm  75 bpm  73 bpm   Heart Rate (Exercise)  93 bpm  94 bpm  103 bpm  105 bpm  93 bpm   Heart Rate (Exit)  58 bpm  60 bpm  75 bpm  74 bpm  71 bpm   Oxygen Saturation (Admit)  96 %  -  -  -  -   Oxygen Saturation (Exercise)  99 %  -  -  -  -   Rating of Perceived Exertion (Exercise)  '10  13  13  13  14   ' Symptoms  none  none  none  none  none   Comments  walk test results  second full day of exercise  -  -  -   Duration  -  Continue with 30 min of aerobic exercise without signs/symptoms of physical distress.  Continue with 30 min of aerobic exercise without signs/symptoms of physical distress.  Continue with 30 min of aerobic exercise without signs/symptoms of physical distress.  Continue with 30 min of aerobic exercise without signs/symptoms of physical distress.   Intensity  -  THRR unchanged  THRR unchanged  THRR unchanged  THRR unchanged     Progression   Progression  -  Continue to progress workloads to maintain intensity without signs/symptoms of physical distress.  Continue to progress workloads to maintain intensity without signs/symptoms of physical distress.  Continue to progress workloads to maintain intensity without signs/symptoms of physical distress.  Continue to progress workloads to maintain intensity without signs/symptoms of physical  distress.   Average METs  -  1.55  1.93  2  2.06     Resistance Training   Training Prescription  -  Yes  Yes  Yes  Yes   Weight  -  3 lbs  3 lbs  3 lbs  3 lbs   Reps  -  10-15  10-15  10-15  10-15     Interval Training   Interval Training  -  No  No  No  No     Treadmill   MPH  -  1.7  1.8  1.8  1.8   Grade  -  0.5  0.5  0.5  0.5   Minutes  -  -  -  15  15   METs  -  2.42  2.5  2.5  2.5     NuStep   Level  -  1  1  -  1   Minutes  -  15  15  -  15  METs  -  1.8  2  -  2     Arm Ergometer   Level  -  -  '1  1  1   ' Minutes  -  -  '15  15  15   ' METs  -  -  1.3  1.5  1.7     Recumbant Elliptical   Level  -  1  -  -  -   Minutes  -  15  -  -  -   METs  -  1.3  -  -  -     Track   Laps  -  16  -  -  -   Minutes  -  10  -  -  -     Home Exercise Plan   Plans to continue exercise at  -  -  -  [employer] (comment) [employer] ([employer] [facility]  [employer] ([facility] [employer] ([employer] possibly)   Frequency  -  -  -  Add 2 additional days to program exercise sessions.  Add 2 additional days to program exercise sessions.   Initial Home Exercises Provided  -  -  -  05/26/18  05/26/18   Row Name 06/23/18 1000             Response to Exercise   Blood Pressure (Admit)  124/80       Blood Pressure (Exercise)  132/70       Blood Pressure (Exit)  128/64       Heart Rate (Admit)  66 bpm       Heart Rate (Exercise)  104 bpm       Heart Rate (Exit)  63 bpm       Rating of Perceived Exertion (Exercise)  12       Symptoms  none       Duration  Continue with 30 min of aerobic exercise without signs/symptoms of physical distress.       Intensity  THRR unchanged         Progression   Progression  Continue to progress workloads to maintain intensity without signs/symptoms of physical distress.       Average METs  2.24         Resistance Training   Training Prescription  Yes       Weight  3 lbs       Reps  10-15         Interval Training   Interval Training  No          Treadmill   MPH  1.8       Grade  1       Minutes  15       METs  2.63         NuStep   Level  4       Minutes  15       METs  2.1         Biostep-RELP   Level  5       Minutes  15       METs  2         Home Exercise Plan   Plans to continue exercise at  [employer] (comment) [employer] ([employer] possibly)       Frequency  Add 2 additional days to program exercise sessions.       Initial Home Exercises Provided  05/26/18  Exercise Comments: Exercise Comments    Row Name 04/20/18 7034           Exercise Comments   First full day of exercise!  Patient was oriented to gym and equipment including functions, settings, policies, and procedures.  Patient's individual exercise prescription and treatment plan were reviewed.  All starting workloads were established based on the results of the 6 minute walk test done at initial orientation visit.  The plan for exercise progression was also introduced and progression will be customized based on patient's performance and goals.          Exercise Goals and Review: Exercise Goals    Row Name 04/12/18 1408             Exercise Goals   Increase Physical Activity  Yes       Intervention  Provide advice, education, support and counseling about physical activity/exercise needs.;Develop an individualized exercise prescription for aerobic and resistive training based on initial evaluation findings, risk stratification, comorbidities and participant's personal goals.       Expected Outcomes  Short Term: Attend rehab on a regular basis to increase amount of physical activity.;Long Term: Exercising regularly at least 3-5 days a week.;Long Term: Add in home exercise to make exercise part of routine and to increase amount of physical activity.       Increase Strength and Stamina  Yes       Intervention  Provide advice, education, support and counseling about physical activity/exercise needs.;Develop an individualized exercise  prescription for aerobic and resistive training based on initial evaluation findings, risk stratification, comorbidities and participant's personal goals.       Expected Outcomes  Short Term: Increase workloads from initial exercise prescription for resistance, speed, and METs.;Short Term: Perform resistance training exercises routinely during rehab and add in resistance training at home;Long Term: Improve cardiorespiratory fitness, muscular endurance and strength as measured by increased METs and functional capacity (6MWT)       Able to understand and use rate of perceived exertion (RPE) scale  Yes       Intervention  Provide education and explanation on how to use RPE scale       Expected Outcomes  Short Term: Able to use RPE daily in rehab to express subjective intensity level;Long Term:  Able to use RPE to guide intensity level when exercising independently       Knowledge and understanding of Target Heart Rate Range (THRR)  Yes       Intervention  Provide education and explanation of THRR including how the numbers were predicted and where they are located for reference       Expected Outcomes  Short Term: Able to state/look up THRR;Short Term: Able to use daily as guideline for intensity in rehab;Long Term: Able to use THRR to govern intensity when exercising independently       Able to check pulse independently  Yes       Intervention  Provide education and demonstration on how to check pulse in carotid and radial arteries.;Review the importance of being able to check your own pulse for safety during independent exercise       Expected Outcomes  Short Term: Able to explain why pulse checking is important during independent exercise;Long Term: Able to check pulse independently and accurately       Understanding of Exercise Prescription  Yes       Intervention  Provide education, explanation, and written materials on patient's individual exercise prescription  Expected Outcomes  Short Term:  Able to explain program exercise prescription;Long Term: Able to explain home exercise prescription to exercise independently          Exercise Goals Re-Evaluation : Exercise Goals Re-Evaluation    Row Name 04/20/18 8469 04/27/18 1422 05/11/18 1418 05/25/18 1400 06/09/18 1548     Exercise Goal Re-Evaluation   Exercise Goals Review  Increase Physical Activity;Increase Strength and Stamina;Able to understand and use rate of perceived exertion (RPE) scale;Knowledge and understanding of Target Heart Rate Range (THRR);Understanding of Exercise Prescription  Increase Physical Activity;Increase Strength and Stamina;Understanding of Exercise Prescription  Increase Physical Activity;Increase Strength and Stamina;Understanding of Exercise Prescription  Increase Physical Activity;Able to understand and use rate of perceived exertion (RPE) scale;Knowledge and understanding of Target Heart Rate Range (THRR);Understanding of Exercise Prescription;Increase Strength and Stamina  Increase Physical Activity;Increase Strength and Stamina;Understanding of Exercise Prescription   Comments  Reviewed RPE scale, THR and program prescription with pt today.  Pt voiced understanding and was given a copy of goals to take home.   Janya has completed her first two full days of exercise.  She did switch from track to treadmill on her second day.  We will continue to monitor her progress.   Leili has been doing well in rehab. She is doing well on the treadmill.  She has switch to arm crank versus recumbent elliptical.  She should be ready to start to make workload increases.  We will continue to monitor her progress.   [place] has noticed that she can do more at home. She used the [employer] yesterday. She likes to go there sometimes on her off days. She does not wear her lymphedema sleeve during exercise now bc it is not a problem for her right now.   Richardson Dopp has been doing well in rehab.  She is still at level 1 on the arm crank but  still a 13 RPE.  We will still try to increase her workloads.  We will continue to monitor her progress.    Expected Outcomes  Short: Use RPE daily to regulate intensity. Long: Follow program prescription in THR.  Short: Continue to attend regularly.  Long: Continue to follow program prescription.   Short: Review home exercise and increase workloads.  Long: Continue to improve strength and stamina.   Short: Go to the [facility] or gym at her complex on days off. Long: Graduate from Cardiac Rehab and continue exercise in either [employer] or the [employer].  Short: Increase workloads.  Long: Continue to increase stamina.    [place] Name 06/10/18 1024 06/23/18 1045 07/09/18 1204 07/29/18 1151       Exercise Goal Re-Evaluation   Exercise Goals Review  Increase Physical Activity;Increase Strength and Stamina;Understanding of Exercise Prescription  Increase Physical Activity;Increase Strength and Stamina;Understanding of Exercise Prescription  Increase Physical Activity;Increase Strength and Stamina;Understanding of Exercise Prescription  Increase Physical Activity;Increase Strength and Stamina;Understanding of Exercise Prescription    Comments  Doran Clay has been doing well in rehab.  She has noted that her surgery site from breat cancer pulls some during exercise. We switched to BioStep to help.  She tries to get to exercise everyday and usually will get 30-60 min in at the [facility] on off days. She is also planning to start going to the [employer] class on Friday.  She is getting better with her strength and stamina and is now able to get up and down her stairs better.   Chenelle continues do well in  rehab.  She is up to level 4 on the NuStep and level 5 on BioStep.  She will be walking and using our videos at home.  We will continue to monitor her progress at home.   Roselia has been staying active at home.  She has been walking and using weights with her daughter.  She notices that she does not feel as good on days that she  does not exercise.    Gertie has gotten out of her routine of exercise at home.  She was doing good, but then the increased pollen has started to bother her breathing and she stopped walking with her daughter.  She has not been doing the videos either. I encouraged her to try the new videos we released yesterday to get back into the swing of things.  She did note that her right knee continues to bother her.  I encouraged her to try icing her knee to help with pain.     Expected Outcomes  Short: Continue to exercise on off days.  Long: Continue to improve stamina.   Short: Continue to walk at home.  Long: Continue to exercise to build strength and stamina.   Short: Continue to walk frequently at home.  Long: Continue to maintain strength and stamina.   Short: Get back into routine of exercising again.  Long: Increase activity at home!       Discharge Exercise Prescription (Final Exercise Prescription Changes): Exercise Prescription Changes - 06/23/18 1000      Response to Exercise   Blood Pressure (Admit)  124/80    Blood Pressure (Exercise)  132/70    Blood Pressure (Exit)  128/64    Heart Rate (Admit)  66 bpm    Heart Rate (Exercise)  104 bpm    Heart Rate (Exit)  63 bpm    Rating of Perceived Exertion (Exercise)  12    Symptoms  none    Duration  Continue with 30 min of aerobic exercise without signs/symptoms of physical distress.    Intensity  THRR unchanged      Progression   Progression  Continue to progress workloads to maintain intensity without signs/symptoms of physical distress.    Average METs  2.24      Resistance Training   Training Prescription  Yes    Weight  3 lbs    Reps  10-15      Interval Training   Interval Training  No      Treadmill   MPH  1.8    Grade  1    Minutes  15    METs  2.63      NuStep   Level  4    Minutes  15    METs  2.1      Biostep-RELP   Level  5    Minutes  15    METs  2      Home Exercise Plan   Plans to continue exercise at   [employer] (comment)   [employer] ([employer] [facility])   Frequency  Add 2 additional days to program exercise sessions.    Initial Home Exercises Provided  05/26/18       Nutrition:  Target Goals: Understanding of nutrition guidelines, daily intake of sodium <1578m, cholesterol <2042m calories 30% from fat and 7% or less from saturated fats, daily to have 5 or more servings of fruits and vegetables.  Biometrics: Pre Biometrics - 04/12/18 1409      Pre Biometrics  Height  5' 3.6" (1.615 m)    Weight  180 lb (81.6 kg)    Waist Circumference  37.5 inches    Hip Circumference  45 inches    Waist to Hip Ratio  0.83 %    BMI (Calculated)  31.3    Single Leg Stand  4.53 seconds        Nutrition Therapy Plan and Nutrition Goals: Nutrition Therapy & Goals - 04/20/18 1050      Nutrition Therapy   Diet  DM    Drug/Food Interactions  Statins/Certain Fruits    Protein (specify units)  9oz    Fiber  25 grams    Whole Grain Foods  3 servings   eats honey wheat bread and oatmeal   Saturated Fats  11 max. grams    Fruits and Vegetables  5 servings/day   8 ideal; eats more fruits than vegetables but is working on increasing   Sodium  1500 grams      Personal Nutrition Goals   Nutrition Goal  Add a protien source to your breakfast, such as peanut butter rather than butter on your toast to help better control blood sugars, especially prior to exercise    Personal Goal #2  When you receive a new BG meter, re-start checking your blood sugars at least daily. Goal for readings: <150    Personal Goal #3  Continue to swap out snack foods that have added sugar IE cake for foods with natural sugar/fiber/improved nutritional profile like fruit, great job!    Comments  Pt has started to make changes to her eating habits s/p heart attack. She has been trying to cut down her salt and does not eat fried foods as often. She has cut out most pop and uses Splenda in her iced tea. She is also trying to  eat fruits as snacks more often. Last HgbA1c (02/2018) 6.2. She reports 15# wt loss x4 months, intentional loss from exercise and slight decrease in appetite. Wt today 176#. 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 Breakfast: 2 slices honey wheat toast with Smart Balance butter + glass of OJ, or oatmeal. Lunch: grilled chicken sandwich, salad. Dinner: "light" grilled chicken or [place], vegetables, some starches. Eats out most nights for dinner      Intervention Plan   Intervention  Prescribe, educate and counsel regarding individualized specific dietary modifications aiming towards targeted core components such as weight, hypertension, lipid management, diabetes, heart failure and other comorbidities.    Expected Outcomes  Short Term Goal: Understand basic principles of dietary content, such as calories, fat, sodium, cholesterol and nutrients.;Short Term Goal: A plan has been developed with personal nutrition goals set during dietitian appointment.;Long Term Goal: Adherence to prescribed nutrition plan.       Nutrition Assessments: Nutrition Assessments - 04/12/18 1133      MEDFICTS Scores   Pre Score  21       Nutrition Goals Re-Evaluation: Nutrition Goals Re-Evaluation    Row Name 04/20/18 1116 05/25/18 1410 06/10/18 1037 07/06/18 1254       Goals   Current Weight  -  171 lb (77.6 kg)  -  -    Nutrition Goal  Add a protien source to your breakfast, such as peanut butter rather than butter on your toast to help better control blood sugars, especially prior to exercise  Drinking more water. She has chicken for protein, salads, and collard greens. She eats oatmeal for breakfast. She has cut out soda and uses Mrs.  Dash.  Drink more water, Stay away from sweets, More protein  Drink more water, Stay away from sweets, More protein, watch sodium    Comment  She ate 2 slices of toast with butter and a glass of OJ for breakfast today and reported her blood sugar reading to be 163 which is high for her  -   She is getting tired of eating salads and cheats sometimes.  She is going to try some spaghetti squash.  She noted that her daughter is still cooking for salt and so she tries to limit it when she can.  She is tryign to get in more water each day and is up to 4 bottles a day.  She has cut back on her sodas and no longer buys them.  She has cut out cookies and moon pies.  She is trying to get enough protein.  Discussed My[employer] and different ways to incorporate healthy foods without having to eat salads. Pt reports continuing to try and eat vegetables in the form of salads as well as dried fruits, continuing to watch sodium and assed sugar.  Discussed mindful eating; pt reports that at this time she has not lost more weight and has been not doing well on her diet during the quarentine.     Expected Outcome  Add a protein source to breakfast consistently along with source of complex carbohydrate  Short: Keep with the healthy choices and add fruit. Long: Continue drinking water and no soda.  Short: Continue to watch sodium.  Long: Continue to work on weight loss with watching her diet.   ST: incorporate healthy foods in a variety of ways and get back on track  LT: continue on healthy eating journey and wt loss      Personal Goal #2 Re-Evaluation   Personal Goal #2  When you receive a new BG meter, re-start checking your blood sugars at least daily. Goal for readings: <150  -  -  -      Personal Goal #3 Re-Evaluation   Personal Goal #3  Continue to swap out snack foods that have added sugar IE cake for foods with natural sugar / fiber / improved nutritional profile like fruit, great job!  -  -  -       Nutrition Goals Discharge (Final Nutrition Goals Re-Evaluation): Nutrition Goals Re-Evaluation - 07/06/18 1254      Goals   Nutrition Goal  Drink more water, Stay away from sweets, More protein, watch sodium    Comment  Discussed MyPlate and different ways to incorporate healthy foods without having to eat  salads. Pt reports continuing to try and eat vegetables in the form of salads as well as dried fruits, continuing to watch sodium and assed sugar.  Discussed mindful eating; pt reports that at this time she has not lost more weight and has been not doing well on her diet during the quarentine.     Expected Outcome  ST: incorporate healthy foods in a variety of ways and get back on track  LT: continue on healthy eating journey and wt loss       Psychosocial: Target Goals: Acknowledge presence or absence of significant depression and/or stress, maximize coping skills, provide positive support system. Participant is able to verbalize types and ability to use techniques and skills needed for reducing stress and depression.   Initial Review & Psychosocial Screening: Initial Psych Review & Screening - 04/12/18 1407      Initial Review  Current issues with  Current Sleep Concerns;Current Stress Concerns    Source of Stress Concerns  Unable to perform yard/household activities;Family      Family Dynamics   Good Support System?  Yes   friends     Barriers   Psychosocial barriers to participate in program  There are no identifiable barriers or psychosocial needs.;The patient should benefit from training in stress management and relaxation.      Screening Interventions   Interventions  Encouraged to exercise;Program counselor consult;To provide support and resources with identified psychosocial needs;Provide feedback about the scores to participant    Expected Outcomes  Short Term goal: Utilizing psychosocial counselor, staff and physician to assist with identification of specific Stressors or current issues interfering with healing process. Setting desired goal for each stressor or current issue identified.;Long Term Goal: Stressors or current issues are controlled or eliminated.;Short Term goal: Identification and review with participant of any Quality of Life or Depression concerns found by scoring  the questionnaire.;Long Term goal: The participant improves quality of Life and PHQ9 Scores as seen by post scores and/or verbalization of changes       Quality of Life Scores:  Quality of Life - 04/12/18 1133      Quality of Life   Select  Quality of Life      Quality of Life Scores   Health/Function Pre  17.9 %    Socioeconomic Pre  29 %    Psych/Spiritual Pre  26.57 %    Family Pre  21.3 %    GLOBAL Pre  22.27 %      Scores of 19 and below usually indicate a poorer quality of life in these areas.  A difference of  2-3 points is a clinically meaningful difference.  A difference of 2-3 points in the total score of the Quality of Life Index has been associated with significant improvement in overall quality of life, self-image, physical symptoms, and general health in studies assessing change in quality of life.  PHQ-9: Recent Review Flowsheet Data    Depression screen [facility] 2/9 04/12/2018 03/03/2018   Decreased Interest 1 0   Down, Depressed, Hopeless 1 0   PHQ - 2 Score 2 0   Altered sleeping 1 -   Tired, decreased energy 1 -   Change in appetite 1 -   Feeling bad or failure about yourself  0 -   Trouble concentrating 1 -   Moving slowly or fidgety/restless 1 -   Suicidal thoughts 0 -   PHQ-9 Score 7 -   Difficult doing work/chores Somewhat difficult -     Interpretation of Total Score  Total Score Depression Severity:  1-4 = Minimal depression, 5-9 = Mild depression, 10-14 = Moderate depression, 15-19 = Moderately severe depression, 20-27 = Severe depression   Psychosocial Evaluation and Intervention: Psychosocial Evaluation - 05/04/18 1025      Psychosocial Evaluation & Interventions   Interventions  Stress management education;Encouraged to exercise with the program and follow exercise prescription    Comments  Counselor met with Ms. Vanderheyden Leda Gauze) today for initial psychosocial evaluation.  She is a 73 year old who had a heart attack and (2) stents inserted on  02/24/18.  Pier has a strong support system with a daughter and grandson who live with her.  She also is actively involved in her local church.  Mersadez has several other health issues besides her heart condition with diabetes; right knee problems; and Arthritis.  She reports sleeping well  and has a good appetite.  Thanh states she has a history of panic and anxiety and is on medication daily for this which helps.  She denies a history of depression or any current symptoms.  Chrishauna states she "stays tense" often and has had some recent stressors that impact this with a granddaughter who passed at the age of one and her own daughter's health.  Her finances are also stressful for her at this time.  Etola has goals to be as healthy as possible; to control her anxiety and to regulate her sugar levels.  She will be followed by staff.      Expected Outcomes  Short:  Sanita will exercise to improve her health and for stress reduction as well.  She will benefit from exercise to regulate her blood sugars as well.  Long:  Dereonna will develop healthy lifestyle habits to manage her stress and her physical health.      Continue Psychosocial Services   Follow up required by staff       Psychosocial Re-Evaluation: Psychosocial Re-Evaluation    [place] Name 05/25/18 1406 06/10/18 1026 07/09/18 1206 07/29/18 1153       Psychosocial Re-Evaluation   Current issues with  Current Stress Concerns;Current Sleep Concerns;Current Depression;None Identified  Current Stress Concerns;Current Sleep Concerns;Current Depression;Current Psychotropic Meds;Current Anxiety/Panic  Current Stress Concerns;Current Sleep Concerns;Current Depression;Current Psychotropic Meds;Current Anxiety/Panic  Current Stress Concerns;Current Sleep Concerns;Current Depression;Current Psychotropic Meds;Current Anxiety/Panic    Comments  Saachi reports sleeping well most of the time. She has a good support system. Her daughter lives with her and they  take care of each other. She had a  51 year old grandbaby pass away in October 2019 which she thinks contributed to her heart attack bc she was so sad and lost appetitie. She is taking Xanax to help with anxiety. She will seek help with a counselor if she feels depressed.  Arryanna is doing well in rehab.  She is in a good place mentally, but admits to still having her moments.  Her anxiety is getting better .  Her daughter is still under a lot of stress but things are starting to get better which is helping everyones feel better.  She is sleeping good most nights.  She denies any current symptoms of depression.    Paloma is doing well at home.  She is praying extra hard today for her daughter and her anemia.  They are worried that it may become cancer or that she will need to be admitted without family around.  She also continues to pray for the crisis with COVID-19 to be over sooner rather than later.  She is sleeping well most nights.     Daryana has been doing pretty well at home.  She has not been exercising as much due to the pollen.  I encouraged her to exercise again to help maintain strength and stamina and as a means to help with mood.  Overall, she is good with being home and goes to store, with mask., only as necessary.  She talks with her daughters daily.     Expected Outcomes  Short: Marigene continue practicing stress management and relaxation techniques. Long: Keep using her coping strategies and see a counselor when needed.  Short: Continue to exercise to help cope.  Long: Continue to stay positive.   Short: Continue to use prayers for coping.  Long: Continue to stay postive.   Short: Continue to talk and exercise.  Long: Continue to practice  self care and stay postive.     Interventions  -  -  Encouraged to attend Cardiac Rehabilitation for the exercise;Stress management education  Encouraged to attend Cardiac Rehabilitation for the exercise;Stress management education    Continue Psychosocial  Services   -  -  Follow up required by staff  Follow up required by staff       Psychosocial Discharge (Final Psychosocial Re-Evaluation): Psychosocial Re-Evaluation - 07/29/18 1153      Psychosocial Re-Evaluation   Current issues with  Current Stress Concerns;Current Sleep Concerns;Current Depression;Current Psychotropic Meds;Current Anxiety/Panic    Comments  Anthonette has been doing pretty well at home.  She has not been exercising as much due to the pollen.  I encouraged her to exercise again to help maintain strength and stamina and as a means to help with mood.  Overall, she is good with being home and goes to store, with mask., only as necessary.  She talks with her daughters daily.     Expected Outcomes  Short: Continue to talk and exercise.  Long: Continue to practice self care and stay postive.     Interventions  Encouraged to attend Cardiac Rehabilitation for the exercise;Stress management education    Continue Psychosocial Services   Follow up required by staff       Vocational Rehabilitation: Provide vocational rehab assistance to qualifying candidates.   Vocational Rehab Evaluation & Intervention: Vocational Rehab - 04/12/18 1407      Initial Vocational Rehab Evaluation & Intervention   Assessment shows need for Vocational Rehabilitation  No       Education: Education Goals: Education classes will be provided on a variety of topics geared toward better understanding of heart health and risk factor modification. Participant will state understanding/return demonstration of topics presented as noted by education test scores.  Learning Barriers/Preferences: Learning Barriers/Preferences - 04/12/18 1405      Learning Barriers/Preferences   Learning Barriers  Sight    Learning Preferences  Individual Instruction       Education Topics:  AED/CPR: - Group verbal and written instruction with the use of models to demonstrate the basic use of the AED with the basic ABC's of  resuscitation.   Cardiac Rehab from 06/17/2018 in [facility] Cardiac and Pulmonary Rehab  Date  06/01/18  Educator  SB  Instruction Review Code  1- Verbalizes Understanding      General Nutrition Guidelines/Fats and Fiber: -Group instruction provided by verbal, written material, models and posters to present the general guidelines for heart healthy nutrition. Gives an explanation and review of dietary fats and fiber.   Cardiac Rehab from 06/17/2018 in [facility] Cardiac and Pulmonary Rehab  Date  06/15/18  Educator  [facility]  Instruction Review Code  1- Verbalizes Understanding      Controlling Sodium/Reading Food Labels: -Group verbal and written material supporting the discussion of sodium use in heart healthy nutrition. Review and explanation with models, verbal and written materials for utilization of the food label.   Cardiac Rehab from 06/17/2018 in [facility] Cardiac and Pulmonary Rehab  Date  06/17/18  Educator  [facility]  Instruction Review Code  1- Verbalizes Understanding      Exercise Physiology & General Exercise Guidelines: - Group verbal and written instruction with models to review the exercise physiology of the cardiovascular system and associated critical values. Provides general exercise guidelines with specific guidelines to those with heart or lung disease.    Aerobic Exercise & Resistance Training: - Gives group verbal and written instruction  on the various components of exercise. Focuses on aerobic and resistive training programs and the benefits of this training and how to safely progress through these programs..   Flexibility, Balance, Mind/Body Relaxation: Provides group verbal/written instruction on the benefits of flexibility and balance training, including mind/body exercise modes such as yoga, pilates and tai chi.  Demonstration and skill practice provided.   Cardiac Rehab from 06/17/2018 in [facility] Cardiac and Pulmonary Rehab  Date  05/04/18  Educator  AS  Instruction Review Code  1-  Verbalizes Understanding      Stress and Anxiety: - Provides group verbal and written instruction about the health risks of elevated stress and causes of high stress.  Discuss the correlation between heart/lung disease and anxiety and treatment options. Review healthy ways to manage with stress and anxiety.   Cardiac Rehab from 06/17/2018 in [facility] Cardiac and Pulmonary Rehab  Date  05/25/18  Educator  [facility]  Instruction Review Code  1- Verbalizes Understanding      Depression: - Provides group verbal and written instruction on the correlation between heart/lung disease and depressed mood, treatment options, and the stigmas associated with seeking treatment.   Anatomy & Physiology of the Heart: - Group verbal and written instruction and models provide basic cardiac anatomy and physiology, with the coronary electrical and arterial systems. Review of Valvular disease and Heart Failure   Cardiac Procedures: - Group verbal and written instruction to review commonly prescribed medications for heart disease. Reviews the medication, class of the drug, and side effects. Includes the steps to properly store meds and maintain the prescription regimen. (beta blockers and nitrates)   Cardiac Rehab from 06/17/2018 in [facility] Cardiac and Pulmonary Rehab  Date  05/27/18  Educator  CE  Instruction Review Code  1- Verbalizes Understanding      Cardiac Medications I: - Group verbal and written instruction to review commonly prescribed medications for heart disease. Reviews the medication, class of the drug, and side effects. Includes the steps to properly store meds and maintain the prescription regimen.   Cardiac Medications II: -Group verbal and written instruction to review commonly prescribed medications for heart disease. Reviews the medication, class of the drug, and side effects. (all other drug classes)   Cardiac Rehab from 06/17/2018 in [facility] Cardiac and Pulmonary Rehab  Date  06/10/18  Educator  CE   Instruction Review Code  1- Verbalizes Understanding       Go Sex-Intimacy & Heart Disease, Get SMART - Goal Setting: - Group verbal and written instruction through game format to discuss heart disease and the return to sexual intimacy. Provides group verbal and written material to discuss and apply goal setting through the application of the S.M.A.R.T. Method.   Cardiac Rehab from 06/17/2018 in [facility] Cardiac and Pulmonary Rehab  Date  05/27/18  Educator  CE  Instruction Review Code  1- Verbalizes Understanding      Other Matters of the Heart: - Provides group verbal, written materials and models to describe Stable Angina and Peripheral Artery. Includes description of the disease process and treatment options available to the cardiac patient.   Exercise & Equipment Safety: - Individual verbal instruction and demonstration of equipment use and safety with use of the equipment.   Cardiac Rehab from 06/17/2018 in [facility] Cardiac and Pulmonary Rehab  Date  04/12/18  Educator  Renita Papa, RN  Instruction Review Code  1- Verbalizes Understanding      Infection Prevention: - Provides verbal and written material to individual with  discussion of infection control including proper hand washing and proper equipment cleaning during exercise session.   Cardiac Rehab from 06/17/2018 in [facility] Cardiac and Pulmonary Rehab  Date  04/12/18  Educator  Renita Papa, RN  Instruction Review Code  1- Verbalizes Understanding      Falls Prevention: - Provides verbal and written material to individual with discussion of falls prevention and safety.   Cardiac Rehab from 06/17/2018 in [facility] Cardiac and Pulmonary Rehab  Date  04/12/18  Educator  Renita Papa, RN  Instruction Review Code  1- Verbalizes Understanding      Diabetes: - Individual verbal and written instruction to review signs/symptoms of diabetes, desired ranges of glucose level fasting, after meals and with exercise. Acknowledge  that pre and post exercise glucose checks will be done for 3 sessions at entry of program.   Know Your Numbers and Risk Factors: -Group verbal and written instruction about important numbers in your health.  Discussion of what are risk factors and how they play a role in the disease process.  Review of Cholesterol, Blood Pressure, Diabetes, and BMI and the role they play in your overall health.   Cardiac Rehab from 06/17/2018 in [facility] Cardiac and Pulmonary Rehab  Date  06/10/18  Educator  CE  Instruction Review Code  1- Verbalizes Understanding      Sleep Hygiene: -Provides group verbal and written instruction about how sleep can affect your health.  Define sleep hygiene, discuss sleep cycles and impact of sleep habits. Review good sleep hygiene tips.    Cardiac Rehab from 06/17/2018 in [facility] Cardiac and Pulmonary Rehab  Date  05/11/18  Educator  [facility]  Instruction Review Code  1- Verbalizes Understanding      Other: -Provides group and verbal instruction on various topics (see comments)   Knowledge Questionnaire Score: Knowledge Questionnaire Score - 04/12/18 1406      Knowledge Questionnaire Score   Pre Score  22/26   correct answers reviewed with Leda Gauze. Focus on anatomy, angina, nutrition, exercise      Core Components/Risk Factors/Patient Goals at Admission: Personal Goals and Risk Factors at Admission - 04/12/18 1403      Core Components/Risk Factors/Patient Goals on Admission    Weight Management  Yes;Weight Loss    Intervention  Weight Management: Develop a combined nutrition and exercise program designed to reach desired caloric intake, while maintaining appropriate intake of nutrient and fiber, sodium and fats, and appropriate energy expenditure required for the weight goal.;Weight Management: Provide education and appropriate resources to help participant work on and attain dietary goals.;Weight Management/Obesity: Establish reasonable short term and long term weight  goals.;Obesity: Provide education and appropriate resources to help participant work on and attain dietary goals.    Admit Weight  180 lb (81.6 kg)    Goal Weight: Short Term  175 lb (79.4 kg)    Goal Weight: Long Term  150 lb (68 kg)    Expected Outcomes  Short Term: Continue to assess and modify interventions until short term weight is achieved;Long Term: Adherence to nutrition and physical activity/exercise program aimed toward attainment of established weight goal;Weight Loss: Understanding of general recommendations for a balanced deficit meal plan, which promotes 1-2 lb weight loss per week and includes a negative energy balance of [phone] kcal/d;Understanding recommendations for meals to include 15-35% energy as protein, 25-35% energy from fat, 35-60% energy from carbohydrates, less than 234m of dietary cholesterol, 20-35 gm of total fiber daily;Understanding of distribution of calorie intake throughout the day  with the consumption of 4-5 meals/snacks    Diabetes  Yes    Intervention  Provide education about signs/symptoms and action to take for hypo/hyperglycemia.;Provide education about proper nutrition, including hydration, and aerobic/resistive exercise prescription along with prescribed medications to achieve blood glucose in normal ranges: Fasting glucose 65-99 mg/dL    Expected Outcomes  Short Term: Participant verbalizes understanding of the signs/symptoms and immediate care of hyper/hypoglycemia, proper foot care and importance of medication, aerobic/resistive exercise and nutrition plan for blood glucose control.;Long Term: Attainment of HbA1C < 7%.    Hypertension  Yes    Intervention  Provide education on lifestyle modifcations including regular physical activity/exercise, weight management, moderate sodium restriction and increased consumption of fresh fruit, vegetables, and low fat dairy, alcohol moderation, and smoking cessation.;Monitor prescription use compliance.    Expected  Outcomes  Short Term: Continued assessment and intervention until BP is < 140/82m HG in hypertensive participants. < 130/894mHG in hypertensive participants with diabetes, heart failure or chronic kidney disease.;Long Term: Maintenance of blood pressure at goal levels.    Lipids  Yes    Intervention  Provide education and support for participant on nutrition & aerobic/resistive exercise along with prescribed medications to achieve LDL <701mHDL >23m61m  Expected Outcomes  Short Term: Participant states understanding of desired cholesterol values and is compliant with medications prescribed. Participant is following exercise prescription and nutrition guidelines.;Long Term: Cholesterol controlled with medications as prescribed, with individualized exercise RX and with personalized nutrition plan. Value goals: LDL < 70mg74mL > 40 mg.       Core Components/Risk Factors/Patient Goals Review:  Goals and Risk Factor Review    Row Name 05/25/18 1452 06/10/18 1035 07/09/18 1209 07/29/18 1155       Core Components/Risk Factors/Patient Goals Review   Personal Goals Review  Weight Management/Obesity;Lipids;Other;Hypertension;Diabetes  Weight Management/Obesity;Lipids;Hypertension;Diabetes  Weight Management/Obesity;Lipids;Hypertension;Diabetes  Weight Management/Obesity;Lipids;Hypertension;Diabetes    Review  MarilLoredas her blood pressure and checks her blood sugar daily. Her readings this morning were 124/78 and 172 mg/dL. She had some chocolate last night which she thinks is what made the blood glucose a little high but it is usually not that high. She takes her meds as prescirbed. 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 She does not have follow up appointments anymore. She informs people to take her blood pressure in her left arm to avoid lymphedema. She has blood work every 3 months and takes Lipitor as prescirbed. She has lost 19 lbs. since October.   MairlEphraim Hamburger been doing well in rehab.  She has been getting out to exercise almost every day.  He weight has been holding steady and she has hit a plateu but continues to work at it.  She is doing well with her blood pressures and running around 130s/70s at home.  Her blood sugars have been around 130s.  She checks both daily.  She is doing well on her medicaitons and is excited about her remission birthday in May.   MarilKimbellainues to do well.  She has an appointment next week and had labs done yesterday and today.  Her weight has been good.  Unfortunately her blood pressures and sugars have been up due to increased stress levels with the virus and her daughter's illness.  She will see what the doctor says next week.   MarilAlysbeen doing well at home.  She had a good follow up appointment.  Her  weight has continued to stay steady and her pressures were good!  However, her A1c was up due to her stress eating. She has been on Little Debbie kicks and Cheerwine kicks some.  She is going to try to get these back under better control.  She also will try to get back into her exercise as her doctor encouraged this as well.     Expected Outcomes  -  Short: Continue to work on weight loss. Long: Continue to monitor risk factors.   Short: Try to stay calm to ease pressures and sugars. And talk to doctor about it as well.  Long: Continue to monitor her risk factors.   Short: Get back into exercise routine for weight maintaince and blood sugar control.  Long: Continue to improve her diabetes management.        Core Components/Risk Factors/Patient Goals at Discharge (Final Review):  Goals and Risk Factor Review - 07/29/18 1155      Core Components/Risk Factors/Patient Goals Review   Personal Goals Review  Weight Management/Obesity;Lipids;Hypertension;Diabetes    Review  Denajah has been doing well at home.  She had a good follow up appointment.  Her weight has continued to stay steady and her pressures were good!   However, her A1c was up due to her stress eating. She has been on Little Debbie kicks and Cheerwine kicks some.  She is going to try to get these back under better control.  She also will try to get back into her exercise as her doctor encouraged this as well.     Expected Outcomes  Short: Get back into exercise routine for weight maintaince and blood sugar control.  Long: Continue to improve her diabetes management.        ITP Comments: ITP Comments    Row Name 04/12/18 1401 04/29/18 1230 05/05/18 0946 05/20/18 1523 06/02/18 0609   ITP Comments  Med Review completed. Initial ITP created. Diagnosis can be found in [facility] 11/18  Patient states that she was feeling Dizzy at home with a blood pressure of 166/98. She states if she feels better she will be back on Tuesday.  30 Day Review. Continue with ITP unless directed changes per Medical Director review.  Called to check on.  She missed the week due to the weather.  She hopes to be back next week.   30 day review. Continue with ITP unless directed changes by Medical Director chart review.   [place] Name 06/23/18 1045 06/30/18 1215 08/23/18 1126       ITP Comments  Our program is currently closed due to COVID-19.  We are communicating with patient via phone calls and emails.    30 day review. Continue with ITP unless directed changes by Medical Director chart review.  Spoke with Leda Gauze today.  She had gotten her bill from [facility] and found that she had a $50 copay for rehab. She does not want to continue to pay that although she did enjoy the program.  She wishes to discharge at this time and plans to return to the [facility] which is closer to home.  Discharge summary and ITP sent for review.         Comments: Discharge ITP

## 2018-08-23 NOTE — Progress Notes (Signed)
Discharge Progress Report  Patient Details  Name: Elaine Cox MRN: [id number] Date of Birth: 06-16-1945 Referring Provider:     Cardiac Rehab from 04/12/2018 in [facility] Cardiac [facility] Pulmonary [facility]  Referring Provider  [name] MD       Number of Visits: 25/36  Reason for Discharge:  Patient reached a stable level of exercise. Patient independent in their exercise. Early Exit:  Insurance  Smoking History:  Social History   Tobacco Use  Smoking Status Never Smoker  Smokeless Tobacco Never Used    Diagnosis:  NSTEMI (non-ST elevated myocardial infarction) ([place])  Status post coronary artery stent placement  ADL [facility]:   Initial Exercise Prescription: Initial Exercise Prescription - 04/12/18 1400      Date of Initial Exercise RX and Referring Provider   Date  04/12/18    Referring Provider  Paraschos, Alexander MD      NuStep   Level  1    SPM  80    Minutes  15    METs  2      Recumbant Elliptical   Level  1    RPM  50    Minutes  15    METs  2      Track   Laps  30    Minutes  15    METs  2.38      Prescription Details   Frequency (times per week)  2    Duration  Progress to 45 minutes of aerobic exercise without signs/symptoms of physical distress      Intensity   THRR 40-80% of Max Heartrate  99-132    Ratings of Perceived Exertion  11-13    Perceived Dyspnea  0-4      Progression   Progression  Continue to progress workloads to maintain intensity without signs/symptoms of physical distress.      Resistance Training   Training Prescription  Yes    Weight  3 lbs    Reps  10-15       Discharge Exercise Prescription (Final Exercise Prescription Changes): Exercise Prescription Changes - 06/23/18 1000      Response to Exercise   Blood Pressure (Admit)  124/80    Blood Pressure (Exercise)  132/70    Blood Pressure (Exit)  128/64    Heart Rate (Admit)  66 bpm    Heart Rate (Exercise)  104 bpm    Heart Rate (Exit)  63 bpm     Rating of Perceived Exertion (Exercise)  12    Symptoms  none    Duration  Continue with 30 min of aerobic exercise without signs/symptoms of physical distress.    Intensity  THRR unchanged      Progression   Progression  Continue to progress workloads to maintain intensity without signs/symptoms of physical distress.    Average METs  2.24      Resistance Training   Training Prescription  Yes    Weight  3 lbs    Reps  10-15      Interval Training   Interval Training  No      Treadmill   MPH  1.8    Grade  1    Minutes  15    METs  2.63      NuStep   Level  4    Minutes  15    METs  2.1      Biostep-RELP   Level  5    Minutes  15  METs  2      Home Exercise Plan   Plans to continue exercise at  [employer] (comment)   [employer] ([employer] possibly)   Frequency  Add 2 additional days to program exercise sessions.    Initial Home Exercises Provided  05/26/18       Functional Capacity: 6 Minute Walk    Row Name 04/12/18 1405         6 Minute Walk   Phase  Initial     Distance  1140 feet     Walk Time  6 minutes     # of Rest Breaks  0     MPH  2.16     METS  2.49     RPE  10     VO2 Peak  8.71     Symptoms  No     Resting HR  66 bpm     Resting BP  132/70     Resting Oxygen Saturation   96 %     Exercise Oxygen Saturation  during 6 min walk  99 %     Max Ex. HR  93 bpm     Max Ex. BP  166/64     2 Minute Post BP  146/66        Psychological, QOL, Others - Outcomes: PHQ 2/9: Depression screen [facility] 2/9 04/12/2018 03/03/2018  Decreased Interest 1 0  Down, Depressed, Hopeless 1 0  PHQ - 2 Score 2 0  Altered sleeping 1 -  Tired, decreased energy 1 -  Change in appetite 1 -  Feeling bad or failure about yourself  0 -  Trouble concentrating 1 -  Moving slowly or fidgety/restless 1 -  Suicidal thoughts 0 -  PHQ-9 Score 7 -  Difficult doing work/chores Somewhat difficult -    Quality of Life: Quality of Life - 04/12/18 1133      Quality of  Life   Select  Quality of Life      Quality of Life Scores   Health/Function Pre  17.9 %    Socioeconomic Pre  29 %    Psych/Spiritual Pre  26.57 %    Family Pre  21.3 %    GLOBAL Pre  22.27 %       Personal Goals: Goals established at orientation with interventions provided to work toward goal. Personal Goals and Risk Factors at Admission - 04/12/18 1403      Core Components/Risk Factors/Patient Goals on Admission    Weight Management  Yes;Weight Loss    Intervention  Weight Management: Develop a combined nutrition and exercise program designed to reach desired caloric intake, while maintaining appropriate intake of nutrient and fiber, sodium and fats, and appropriate energy expenditure required for the weight goal.;Weight Management: Provide education and appropriate resources to help participant work on and attain dietary goals.;Weight Management/Obesity: Establish reasonable short term and long term weight goals.;Obesity: Provide education and appropriate resources to help participant work on and attain dietary goals.    Admit Weight  180 lb (81.6 kg)    Goal Weight: Short Term  175 lb (79.4 kg)    Goal Weight: Long Term  150 lb (68 kg)    Expected Outcomes  Short Term: Continue to assess and modify interventions until short term weight is achieved;Long Term: Adherence to nutrition and physical activity/exercise program aimed toward attainment of established weight goal;Weight Loss: Understanding of general recommendations for a balanced deficit meal plan, which promotes 1-2 lb weight loss  per week and includes a negative energy balance of [phone] kcal/d;Understanding recommendations for meals to include 15-35% energy as protein, 25-35% energy from fat, 35-60% energy from carbohydrates, less than 200mg  of dietary cholesterol, 20-35 gm of total fiber daily;Understanding of distribution of calorie intake throughout the day with the consumption of 4-5 meals/snacks    Diabetes  Yes     Intervention  Provide education about signs/symptoms and action to take for hypo/hyperglycemia.;Provide education about proper nutrition, including hydration, and aerobic/resistive exercise prescription along with prescribed medications to achieve blood glucose in normal ranges: Fasting glucose 65-99 mg/dL    Expected Outcomes  Short Term: Participant verbalizes understanding of the signs/symptoms and immediate care of hyper/hypoglycemia, proper foot care and importance of medication, aerobic/resistive exercise and nutrition plan for blood glucose control.;Long Term: Attainment of HbA1C < 7%.    Hypertension  Yes    Intervention  Provide education on lifestyle modifcations including regular physical activity/exercise, weight management, moderate sodium restriction and increased consumption of fresh fruit, vegetables, and low fat dairy, alcohol moderation, and smoking cessation.;Monitor prescription use compliance.    Expected Outcomes  Short Term: Continued assessment and intervention until BP is < 140/57mm HG in hypertensive participants. < 130/5mm HG in hypertensive participants with diabetes, heart failure or chronic kidney disease.;Long Term: Maintenance of blood pressure at goal levels.    Lipids  Yes    Intervention  Provide education and support for participant on nutrition & aerobic/resistive exercise along with prescribed medications to achieve LDL 70mg , HDL >40mg .    Expected Outcomes  Short Term: Participant states understanding of desired cholesterol values and is compliant with medications prescribed. Participant is following exercise prescription and nutrition guidelines.;Long Term: Cholesterol controlled with medications as prescribed, with individualized exercise RX and with personalized nutrition plan. Value goals: LDL < 70mg , HDL > 40 mg.        Personal Goals Discharge: Goals and Risk Factor Review    Row Name 05/25/18 1452 06/10/18 1035 07/09/18 1209 07/29/18 1155       Core  Components/Risk Factors/Patient Goals Review   Personal Goals Review  Weight Management/Obesity;Lipids;Other;Hypertension;Diabetes  Weight Management/Obesity;Lipids;Hypertension;Diabetes  Weight Management/Obesity;Lipids;Hypertension;Diabetes  Weight Management/Obesity;Lipids;Hypertension;Diabetes    Review  Natally takes her blood pressure and checks her blood sugar daily. Her readings this morning were 124/78 and 172 mg/dL. She had some chocolate last night which she thinks is what made the blood glucose a little high but it is usually not that high. She takes her meds as prescirbed. 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 She does not have follow up appointments anymore. She informs people to take her blood pressure in her left arm to avoid lymphedema. She has blood work every 3 months and takes Lipitor as prescirbed. She has lost 19 lbs. since October.   Ephraim Hamburger has been doing well in rehab.  She has been getting out to exercise almost every day.  He weight has been holding steady and she has hit a plateu but continues to work at it.  She is doing well with her blood pressures and running around 130s/70s at home.  Her blood sugars have been around 130s.  She checks both daily.  She is doing well on her medicaitons and is excited about her remission birthday in May.   Asyia continues to do well.  She has an appointment next week and had labs done yesterday and today.  Her weight has been good.  Unfortunately her blood pressures  and sugars have been up due to increased stress levels with the virus and her daughter's illness.  She will see what the doctor says next week.   Jaydan has been doing well at home.  She had a good follow up appointment.  Her weight has continued to stay steady and her pressures were good!  However, her A1c was up due to her stress eating. She has been on Little Debbie kicks and Cheerwine kicks some.  She is going to try to get these back under  better control.  She also will try to get back into her exercise as her doctor encouraged this as well.     Expected Outcomes  -  Short: Continue to work on weight loss. Long: Continue to monitor risk factors.   Short: Try to stay calm to ease pressures and sugars. And talk to doctor about it as well.  Long: Continue to monitor her risk factors.   Short: Get back into exercise routine for weight maintaince and blood sugar control.  Long: Continue to improve her diabetes management.        Exercise Goals and Review: Exercise Goals    Row Name 04/12/18 1408             Exercise Goals   Increase Physical Activity  Yes       Intervention  Provide advice, education, support and counseling about physical activity/exercise needs.;Develop an individualized exercise prescription for aerobic and resistive training based on initial evaluation findings, risk stratification, comorbidities and participant's personal goals.       Expected Outcomes  Short Term: Attend rehab on a regular basis to increase amount of physical activity.;Long Term: Exercising regularly at least 3-5 days a week.;Long Term: Add in home exercise to make exercise part of routine and to increase amount of physical activity.       Increase Strength and Stamina  Yes       Intervention  Provide advice, education, support and counseling about physical activity/exercise needs.;Develop an individualized exercise prescription for aerobic and resistive training based on initial evaluation findings, risk stratification, comorbidities and participant's personal goals.       Expected Outcomes  Short Term: Increase workloads from initial exercise prescription for resistance, speed, and METs.;Short Term: Perform resistance training exercises routinely during rehab and add in resistance training at home;Long Term: Improve cardiorespiratory fitness, muscular endurance and strength as measured by increased METs and functional capacity (6MWT)       Able  to understand and use rate of perceived exertion (RPE) scale  Yes       Intervention  Provide education and explanation on how to use RPE scale       Expected Outcomes  Short Term: Able to use RPE daily in rehab to express subjective intensity level;Long Term:  Able to use RPE to guide intensity level when exercising independently       Knowledge and understanding of Target Heart Rate Range (THRR)  Yes       Intervention  Provide education and explanation of THRR including how the numbers were predicted and where they are located for reference       Expected Outcomes  Short Term: Able to state/look up THRR;Short Term: Able to use daily as guideline for intensity in rehab;Long Term: Able to use THRR to govern intensity when exercising independently       Able to check pulse independently  Yes       Intervention  Provide education and demonstration  on how to check pulse in carotid and radial arteries.;Review the importance of being able to check your own pulse for safety during independent exercise       Expected Outcomes  Short Term: Able to explain why pulse checking is important during independent exercise;Long Term: Able to check pulse independently and accurately       Understanding of Exercise Prescription  Yes       Intervention  Provide education, explanation, and written materials on patient's individual exercise prescription       Expected Outcomes  Short Term: Able to explain program exercise prescription;Long Term: Able to explain home exercise prescription to exercise independently          Exercise Goals Re-Evaluation: Exercise Goals Re-Evaluation    Row Name 04/20/18 3762 04/27/18 1422 05/11/18 1418 05/25/18 1400 06/09/18 1548     Exercise Goal Re-Evaluation   Exercise Goals Review  Increase Physical Activity;Increase Strength and Stamina;Able to understand and use rate of perceived exertion (RPE) scale;Knowledge and understanding of Target Heart Rate Range (THRR);Understanding of  Exercise Prescription  Increase Physical Activity;Increase Strength and Stamina;Understanding of Exercise Prescription  Increase Physical Activity;Increase Strength and Stamina;Understanding of Exercise Prescription  Increase Physical Activity;Able to understand and use rate of perceived exertion (RPE) scale;Knowledge and understanding of Target Heart Rate Range (THRR);Understanding of Exercise Prescription;Increase Strength and Stamina  Increase Physical Activity;Increase Strength and Stamina;Understanding of Exercise Prescription   Comments  Reviewed RPE scale, THR and program prescription with pt today.  Pt voiced understanding and was given a copy of goals to take home.   Shawny has completed her first two full days of exercise.  She did switch from track to treadmill on her second day.  We will continue to monitor her progress.   Lenaya has been doing well in rehab. She is doing well on the treadmill.  She has switch to arm crank versus recumbent elliptical.  She should be ready to start to make workload increases.  We will continue to monitor her progress.   Tawana has noticed that she can do more at home. She used the [employer] yesterday. She likes to go there sometimes on her off days. She does not wear her lymphedema sleeve during exercise now bc it is not a problem for her right now.   Richardson Dopp has been doing well in rehab.  She is still at level 1 on the arm crank but still a 13 RPE.  We will still try to increase her workloads.  We will continue to monitor her progress.    Expected Outcomes  Short: Use RPE daily to regulate intensity. Long: Follow program prescription in THR.  Short: Continue to attend regularly.  Long: Continue to follow program prescription.   Short: Review home exercise and increase workloads.  Long: Continue to improve strength and stamina.   Short: Go to the [facility] or gym at her complex on days off. Long: Graduate from Cardiac Rehab and continue exercise in either [employer] or  the [employer].  Short: Increase workloads.  Long: Continue to increase stamina.    [place] Name 06/10/18 1024 06/23/18 1045 07/09/18 1204 07/29/18 1151       Exercise Goal Re-Evaluation   Exercise Goals Review  Increase Physical Activity;Increase Strength and Stamina;Understanding of Exercise Prescription  Increase Physical Activity;Increase Strength and Stamina;Understanding of Exercise Prescription  Increase Physical Activity;Increase Strength and Stamina;Understanding of Exercise Prescription  Increase Physical Activity;Increase Strength and Stamina;Understanding of Exercise Prescription    Comments  Marylin  has been doing well in rehab.  She has noted that her surgery site from breat cancer pulls some during exercise. We switched to BioStep to help.  She tries to get to exercise everyday and usually will get 30-60 min in at the [facility] on off days. She is also planning to start going to the [employer] class on Friday.  She is getting better with her strength and stamina and is now able to get up and down her stairs better.   Sung continues do well in rehab.  She is up to level 4 on the NuStep and level 5 on BioStep.  She will be walking and using our videos at home.  We will continue to monitor her progress at home.   Keeli has been staying active at home.  She has been walking and using weights with her daughter.  She notices that she does not feel as good on days that she does not exercise.    Jennavieve has gotten out of her routine of exercise at home.  She was doing good, but then the increased pollen has started to bother her breathing and she stopped walking with her daughter.  She has not been doing the videos either. I encouraged her to try the new videos we released yesterday to get back into the swing of things.  She did note that her right knee continues to bother her.  I encouraged her to try icing her knee to help with pain.     Expected Outcomes  Short: Continue to exercise on off days.  Long:  Continue to improve stamina.   Short: Continue to walk at home.  Long: Continue to exercise to build strength and stamina.   Short: Continue to walk frequently at home.  Long: Continue to maintain strength and stamina.   Short: Get back into routine of exercising again.  Long: Increase activity at home!       Nutrition & Weight - Outcomes: Pre Biometrics - 04/12/18 1409      Pre Biometrics   Height  5' 3.6" (1.615 m)    Weight  180 lb (81.6 kg)    Waist Circumference  37.5 inches    Hip Circumference  45 inches    Waist to Hip Ratio  0.83 %    BMI (Calculated)  31.3    Single Leg Stand  4.53 seconds        Nutrition: Nutrition Therapy & Goals - 04/20/18 1050      Nutrition Therapy   Diet  DM    Drug/Food Interactions  Statins/Certain Fruits    Protein (specify units)  9oz    Fiber  25 grams    Whole Grain Foods  3 servings   eats honey wheat bread and oatmeal   Saturated Fats  11 max. grams    Fruits and Vegetables  5 servings/day   8 ideal; eats more fruits than vegetables but is working on increasing   Sodium  1500 grams      Personal Nutrition Goals   Nutrition Goal  Add a protien source to your breakfast, such as peanut butter rather than butter on your toast to help better control blood sugars, especially prior to exercise    Personal Goal #2  When you receive a new BG meter, re-start checking your blood sugars at least daily. Goal for readings: <150    Personal Goal #3  Continue to swap out snack foods that have added sugar IE cake for foods  with natural sugar/fiber/improved nutritional profile like fruit, great job!    Comments  Pt has started to make changes to her eating habits s/p heart attack. She has been trying to cut down her salt and does not eat fried foods as often. She has cut out most pop and uses Splenda in her iced tea. She is also trying to eat fruits as snacks more often. Last HgbA1c (02/2018) 6.2. She reports 15# wt loss x4 months, intentional loss from  exercise and slight decrease in appetite. Wt today 176#. 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 Breakfast: 2 slices honey wheat toast with Smart Balance butter + glass of OJ, or oatmeal. Lunch: grilled chicken sandwich, salad. Dinner: "light" grilled chicken or [place], vegetables, some starches. Eats out most nights for dinner      Intervention Plan   Intervention  Prescribe, educate and counsel regarding individualized specific dietary modifications aiming towards targeted core components such as weight, hypertension, lipid management, diabetes, heart failure and other comorbidities.    Expected Outcomes  Short Term Goal: Understand basic principles of dietary content, such as calories, fat, sodium, cholesterol and nutrients.;Short Term Goal: A plan has been developed with personal nutrition goals set during dietitian appointment.;Long Term Goal: Adherence to prescribed nutrition plan.       Nutrition Discharge: Nutrition Assessments - 04/12/18 1133      MEDFICTS Scores   Pre Score  21       Education Questionnaire Score: Knowledge Questionnaire Score - 04/12/18 1406      Knowledge Questionnaire Score   Pre Score  22/26   correct answers reviewed with Leda Gauze. Focus on anatomy, angina, nutrition, exercise      Goals reviewed with patient; copy given to patient.

## 2018-09-07 DIAGNOSIS — M1711 Unilateral primary osteoarthritis, right knee: Secondary | ICD-10-CM | POA: Diagnosis not present

## 2018-09-07 DIAGNOSIS — M059 Rheumatoid arthritis with rheumatoid factor, unspecified: Secondary | ICD-10-CM | POA: Diagnosis not present

## 2018-09-07 DIAGNOSIS — Z79899 Other long term (current) drug therapy: Secondary | ICD-10-CM | POA: Diagnosis not present

## 2018-09-13 ENCOUNTER — Other Ambulatory Visit: Payer: Self-pay | Admitting: Internal Medicine

## 2018-09-13 DIAGNOSIS — Z1231 Encounter for screening mammogram for malignant neoplasm of breast: Secondary | ICD-10-CM

## 2018-09-22 DIAGNOSIS — Z9011 Acquired absence of right breast and nipple: Secondary | ICD-10-CM | POA: Diagnosis not present

## 2018-09-27 DIAGNOSIS — M9903 Segmental and somatic dysfunction of lumbar region: Secondary | ICD-10-CM | POA: Diagnosis not present

## 2018-09-27 DIAGNOSIS — M955 Acquired deformity of pelvis: Secondary | ICD-10-CM | POA: Diagnosis not present

## 2018-09-27 DIAGNOSIS — M5416 Radiculopathy, lumbar region: Secondary | ICD-10-CM | POA: Diagnosis not present

## 2018-09-27 DIAGNOSIS — M9905 Segmental and somatic dysfunction of pelvic region: Secondary | ICD-10-CM | POA: Diagnosis not present

## 2018-10-05 DIAGNOSIS — Z9011 Acquired absence of right breast and nipple: Secondary | ICD-10-CM | POA: Diagnosis not present

## 2018-10-25 DIAGNOSIS — M9905 Segmental and somatic dysfunction of pelvic region: Secondary | ICD-10-CM | POA: Diagnosis not present

## 2018-10-25 DIAGNOSIS — M9903 Segmental and somatic dysfunction of lumbar region: Secondary | ICD-10-CM | POA: Diagnosis not present

## 2018-10-25 DIAGNOSIS — M5416 Radiculopathy, lumbar region: Secondary | ICD-10-CM | POA: Diagnosis not present

## 2018-10-25 DIAGNOSIS — M955 Acquired deformity of pelvis: Secondary | ICD-10-CM | POA: Diagnosis not present

## 2018-11-08 DIAGNOSIS — M955 Acquired deformity of pelvis: Secondary | ICD-10-CM | POA: Diagnosis not present

## 2018-11-08 DIAGNOSIS — M5416 Radiculopathy, lumbar region: Secondary | ICD-10-CM | POA: Diagnosis not present

## 2018-11-08 DIAGNOSIS — M9905 Segmental and somatic dysfunction of pelvic region: Secondary | ICD-10-CM | POA: Diagnosis not present

## 2018-11-08 DIAGNOSIS — M9903 Segmental and somatic dysfunction of lumbar region: Secondary | ICD-10-CM | POA: Diagnosis not present

## 2018-11-16 DIAGNOSIS — E7849 Other hyperlipidemia: Secondary | ICD-10-CM | POA: Diagnosis not present

## 2018-11-16 DIAGNOSIS — E1165 Type 2 diabetes mellitus with hyperglycemia: Secondary | ICD-10-CM | POA: Diagnosis not present

## 2018-11-16 DIAGNOSIS — I10 Essential (primary) hypertension: Secondary | ICD-10-CM | POA: Diagnosis not present

## 2018-11-16 DIAGNOSIS — I2510 Atherosclerotic heart disease of native coronary artery without angina pectoris: Secondary | ICD-10-CM | POA: Diagnosis not present

## 2018-11-16 DIAGNOSIS — M059 Rheumatoid arthritis with rheumatoid factor, unspecified: Secondary | ICD-10-CM | POA: Diagnosis not present

## 2018-11-18 DIAGNOSIS — I2510 Atherosclerotic heart disease of native coronary artery without angina pectoris: Secondary | ICD-10-CM | POA: Diagnosis not present

## 2018-11-18 DIAGNOSIS — F410 Panic disorder [episodic paroxysmal anxiety] without agoraphobia: Secondary | ICD-10-CM | POA: Diagnosis not present

## 2018-11-18 DIAGNOSIS — J0191 Acute recurrent sinusitis, unspecified: Secondary | ICD-10-CM | POA: Diagnosis not present

## 2018-11-18 DIAGNOSIS — Z0000 Encounter for general adult medical examination without abnormal findings: Secondary | ICD-10-CM | POA: Diagnosis not present

## 2018-11-18 DIAGNOSIS — M05721 Rheumatoid arthritis with rheumatoid factor of right elbow without organ or systems involvement: Secondary | ICD-10-CM | POA: Diagnosis not present

## 2018-11-18 DIAGNOSIS — E876 Hypokalemia: Secondary | ICD-10-CM | POA: Diagnosis not present

## 2018-11-18 DIAGNOSIS — I10 Essential (primary) hypertension: Secondary | ICD-10-CM | POA: Diagnosis not present

## 2018-11-18 DIAGNOSIS — N390 Urinary tract infection, site not specified: Secondary | ICD-10-CM | POA: Diagnosis not present

## 2018-11-18 DIAGNOSIS — E1165 Type 2 diabetes mellitus with hyperglycemia: Secondary | ICD-10-CM | POA: Diagnosis not present

## 2018-11-22 DIAGNOSIS — M5416 Radiculopathy, lumbar region: Secondary | ICD-10-CM | POA: Diagnosis not present

## 2018-11-22 DIAGNOSIS — M955 Acquired deformity of pelvis: Secondary | ICD-10-CM | POA: Diagnosis not present

## 2018-11-22 DIAGNOSIS — N390 Urinary tract infection, site not specified: Secondary | ICD-10-CM | POA: Diagnosis not present

## 2018-11-22 DIAGNOSIS — E876 Hypokalemia: Secondary | ICD-10-CM | POA: Diagnosis not present

## 2018-11-22 DIAGNOSIS — M9903 Segmental and somatic dysfunction of lumbar region: Secondary | ICD-10-CM | POA: Diagnosis not present

## 2018-11-22 DIAGNOSIS — R8290 Unspecified abnormal findings in urine: Secondary | ICD-10-CM | POA: Diagnosis not present

## 2018-11-22 DIAGNOSIS — M9905 Segmental and somatic dysfunction of pelvic region: Secondary | ICD-10-CM | POA: Diagnosis not present

## 2018-12-06 DIAGNOSIS — E876 Hypokalemia: Secondary | ICD-10-CM | POA: Diagnosis not present

## 2018-12-06 DIAGNOSIS — M9903 Segmental and somatic dysfunction of lumbar region: Secondary | ICD-10-CM | POA: Diagnosis not present

## 2018-12-06 DIAGNOSIS — M955 Acquired deformity of pelvis: Secondary | ICD-10-CM | POA: Diagnosis not present

## 2018-12-06 DIAGNOSIS — M5416 Radiculopathy, lumbar region: Secondary | ICD-10-CM | POA: Diagnosis not present

## 2018-12-06 DIAGNOSIS — M9905 Segmental and somatic dysfunction of pelvic region: Secondary | ICD-10-CM | POA: Diagnosis not present

## 2018-12-07 ENCOUNTER — Other Ambulatory Visit: Payer: Self-pay | Admitting: Internal Medicine

## 2018-12-07 DIAGNOSIS — Z1231 Encounter for screening mammogram for malignant neoplasm of breast: Secondary | ICD-10-CM

## 2018-12-16 DIAGNOSIS — I214 Non-ST elevation (NSTEMI) myocardial infarction: Secondary | ICD-10-CM | POA: Diagnosis not present

## 2018-12-16 DIAGNOSIS — I10 Essential (primary) hypertension: Secondary | ICD-10-CM | POA: Diagnosis not present

## 2018-12-16 DIAGNOSIS — I2510 Atherosclerotic heart disease of native coronary artery without angina pectoris: Secondary | ICD-10-CM | POA: Diagnosis not present

## 2018-12-16 DIAGNOSIS — E1165 Type 2 diabetes mellitus with hyperglycemia: Secondary | ICD-10-CM | POA: Diagnosis not present

## 2018-12-16 DIAGNOSIS — E7849 Other hyperlipidemia: Secondary | ICD-10-CM | POA: Diagnosis not present

## 2018-12-24 ENCOUNTER — Ambulatory Visit
Admission: RE | Admit: 2018-12-24 | Discharge: 2018-12-24 | Disposition: A | Discharge status: Home / Self Care | Payer: Medicare HMO | Source: Ambulatory Visit | Attending: Internal Medicine | Admitting: Internal Medicine

## 2018-12-24 ENCOUNTER — Other Ambulatory Visit: Payer: Self-pay

## 2018-12-24 DIAGNOSIS — Z1231 Encounter for screening mammogram for malignant neoplasm of breast: Secondary | ICD-10-CM

## 2019-01-10 DIAGNOSIS — M059 Rheumatoid arthritis with rheumatoid factor, unspecified: Secondary | ICD-10-CM | POA: Diagnosis not present

## 2019-01-10 DIAGNOSIS — M1711 Unilateral primary osteoarthritis, right knee: Secondary | ICD-10-CM | POA: Diagnosis not present

## 2019-01-10 DIAGNOSIS — Z79899 Other long term (current) drug therapy: Secondary | ICD-10-CM | POA: Diagnosis not present

## 2019-01-20 DIAGNOSIS — R399 Unspecified symptoms and signs involving the genitourinary system: Secondary | ICD-10-CM | POA: Diagnosis not present

## 2019-01-20 DIAGNOSIS — R8290 Unspecified abnormal findings in urine: Secondary | ICD-10-CM | POA: Diagnosis not present

## 2019-01-28 DIAGNOSIS — M05722 Rheumatoid arthritis with rheumatoid factor of left elbow without organ or systems involvement: Secondary | ICD-10-CM | POA: Diagnosis not present

## 2019-01-28 DIAGNOSIS — N390 Urinary tract infection, site not specified: Secondary | ICD-10-CM | POA: Diagnosis not present

## 2019-01-28 DIAGNOSIS — E876 Hypokalemia: Secondary | ICD-10-CM | POA: Diagnosis not present

## 2019-01-28 DIAGNOSIS — E1165 Type 2 diabetes mellitus with hyperglycemia: Secondary | ICD-10-CM | POA: Diagnosis not present

## 2019-01-28 DIAGNOSIS — M05721 Rheumatoid arthritis with rheumatoid factor of right elbow without organ or systems involvement: Secondary | ICD-10-CM | POA: Diagnosis not present

## 2019-01-28 DIAGNOSIS — I2510 Atherosclerotic heart disease of native coronary artery without angina pectoris: Secondary | ICD-10-CM | POA: Diagnosis not present

## 2019-01-28 DIAGNOSIS — J0191 Acute recurrent sinusitis, unspecified: Secondary | ICD-10-CM | POA: Diagnosis not present

## 2019-01-28 DIAGNOSIS — I10 Essential (primary) hypertension: Secondary | ICD-10-CM | POA: Diagnosis not present

## 2019-01-31 DIAGNOSIS — M069 Rheumatoid arthritis, unspecified: Secondary | ICD-10-CM | POA: Diagnosis not present

## 2019-01-31 DIAGNOSIS — N390 Urinary tract infection, site not specified: Secondary | ICD-10-CM | POA: Diagnosis not present

## 2019-01-31 DIAGNOSIS — I2510 Atherosclerotic heart disease of native coronary artery without angina pectoris: Secondary | ICD-10-CM | POA: Diagnosis not present

## 2019-01-31 DIAGNOSIS — B961 Klebsiella pneumoniae [K. pneumoniae] as the cause of diseases classified elsewhere: Secondary | ICD-10-CM | POA: Diagnosis not present

## 2019-01-31 DIAGNOSIS — E119 Type 2 diabetes mellitus without complications: Secondary | ICD-10-CM | POA: Diagnosis not present

## 2019-01-31 DIAGNOSIS — I252 Old myocardial infarction: Secondary | ICD-10-CM | POA: Diagnosis not present

## 2019-01-31 DIAGNOSIS — K219 Gastro-esophageal reflux disease without esophagitis: Secondary | ICD-10-CM | POA: Diagnosis not present

## 2019-01-31 DIAGNOSIS — E785 Hyperlipidemia, unspecified: Secondary | ICD-10-CM | POA: Diagnosis not present

## 2019-01-31 DIAGNOSIS — Z23 Encounter for immunization: Secondary | ICD-10-CM | POA: Diagnosis not present

## 2019-01-31 DIAGNOSIS — I10 Essential (primary) hypertension: Secondary | ICD-10-CM | POA: Diagnosis not present

## 2019-02-02 DIAGNOSIS — E119 Type 2 diabetes mellitus without complications: Secondary | ICD-10-CM | POA: Diagnosis not present

## 2019-05-25 DIAGNOSIS — J3489 Other specified disorders of nose and nasal sinuses: Secondary | ICD-10-CM | POA: Diagnosis not present

## 2019-05-25 DIAGNOSIS — E1165 Type 2 diabetes mellitus with hyperglycemia: Secondary | ICD-10-CM | POA: Diagnosis not present

## 2019-05-25 DIAGNOSIS — M059 Rheumatoid arthritis with rheumatoid factor, unspecified: Secondary | ICD-10-CM | POA: Diagnosis not present

## 2019-05-25 DIAGNOSIS — Z79899 Other long term (current) drug therapy: Secondary | ICD-10-CM | POA: Diagnosis not present

## 2019-05-25 DIAGNOSIS — R21 Rash and other nonspecific skin eruption: Secondary | ICD-10-CM | POA: Diagnosis not present

## 2019-05-25 DIAGNOSIS — E7849 Other hyperlipidemia: Secondary | ICD-10-CM | POA: Diagnosis not present

## 2019-05-25 DIAGNOSIS — I10 Essential (primary) hypertension: Secondary | ICD-10-CM | POA: Diagnosis not present

## 2019-05-25 DIAGNOSIS — N390 Urinary tract infection, site not specified: Secondary | ICD-10-CM | POA: Diagnosis not present

## 2019-05-25 DIAGNOSIS — I2510 Atherosclerotic heart disease of native coronary artery without angina pectoris: Secondary | ICD-10-CM | POA: Diagnosis not present

## 2019-06-01 DIAGNOSIS — I214 Non-ST elevation (NSTEMI) myocardial infarction: Secondary | ICD-10-CM | POA: Diagnosis not present

## 2019-06-01 DIAGNOSIS — M069 Rheumatoid arthritis, unspecified: Secondary | ICD-10-CM | POA: Diagnosis not present

## 2019-06-01 DIAGNOSIS — J3489 Other specified disorders of nose and nasal sinuses: Secondary | ICD-10-CM | POA: Diagnosis not present

## 2019-06-01 DIAGNOSIS — I2510 Atherosclerotic heart disease of native coronary artery without angina pectoris: Secondary | ICD-10-CM | POA: Diagnosis not present

## 2019-06-01 DIAGNOSIS — Z0000 Encounter for general adult medical examination without abnormal findings: Secondary | ICD-10-CM | POA: Diagnosis not present

## 2019-06-01 DIAGNOSIS — E119 Type 2 diabetes mellitus without complications: Secondary | ICD-10-CM | POA: Diagnosis not present

## 2019-06-01 DIAGNOSIS — I10 Essential (primary) hypertension: Secondary | ICD-10-CM | POA: Diagnosis not present

## 2019-06-01 DIAGNOSIS — R3129 Other microscopic hematuria: Secondary | ICD-10-CM | POA: Diagnosis not present

## 2019-06-01 DIAGNOSIS — F418 Other specified anxiety disorders: Secondary | ICD-10-CM | POA: Diagnosis not present

## 2019-06-16 DIAGNOSIS — N390 Urinary tract infection, site not specified: Secondary | ICD-10-CM | POA: Diagnosis not present

## 2019-06-30 NOTE — Progress Notes (Signed)
07/01/19 10:05 AM   Elaine Cox 12-22-1945 [id number]  Referring provider: Tracie Harrier, MD [address] [facility] [place],  [place] [address]  Chief Complaint  Patient presents with  . Hematuria    HPI: 74 yo African American F presents today for evaluation and management of microscopic hematuria.   -Complains of recurrent urinary symptoms frequency, urgency, dysuria -denies gross hematuria, flank pain, and abdominal pain  -UA review from PCP with several showing microscopic hematuria however >10 epithelial cells which is consistent with a contaminated specimen -History recurrent stone disease previously followed Dr. Jacqlyn Larsen -CT from 2019 showed right sided nephrolithiasis  -UA from PCP wellness visit showed microscopic blood  -states of Urgent care visit for E. Coli infection and was prescribed Augmentin  -reports PCP switched her to [facility] 3/24   PMH: Past Medical History:  Diagnosis Date  . Anginal pain ([place]) 06/2016   being followed by dr. Ubaldo Glassing  . Anxiety   . Arthritis    Osteoarthritis  . Arthritis    Rheumatoid  . Breast cancer ([place]) 2000   Right Breast - Chemotherapy  . 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  . Coronary artery disease   . Diabetes mellitus without complication [facility])    Patient takes Metformin  . Dyspnea 03/2017  . GERD (gastroesophageal reflux disease)   . History of abdominal hysterectomy   . History of eyelid surgery   . History of kidney stones   . Hyperlipidemia   . Hypertension   . Lumbago   . Myocardial infarction ([place]) 07/2010  . Orthopnea 03/2017  . Personal history of chemotherapy 2000   right breast ca  . Sinus disorder   . Stroke [facility]) 2006, 2012  . Wears dentures    full upper and lower    Surgical History: Past Surgical History:  Procedure Laterality Date  . ABDOMINAL HYSTERECTOMY    . BREAST SURGERY Right    mastectomy   . CARDIAC CATHETERIZATION  2012   stent  placed  . CATARACT EXTRACTION W/PHACO Right 06/24/2017   Procedure: CATARACT EXTRACTION PHACO AND INTRAOCULAR LENS PLACEMENT ([place]) COMPLICATED RIGHT DIABETIC;  Surgeon: Leandrew Koyanagi, MD;  Location: [facility];  Service: Ophthalmology;  Laterality: Right;  Diabetes - oral med  . COLONOSCOPY    . COLONOSCOPY WITH PROPOFOL N/A 12/08/2017   Procedure: COLONOSCOPY WITH PROPOFOL;  Surgeon: Toledo, Benay Pike, MD;  Location: [facility];  Service: Gastroenterology;  Laterality: N/A;  . CORONARY ANGIOPLASTY    . CORONARY STENT INTERVENTION N/A 02/24/2018   Procedure: CORONARY STENT INTERVENTION;  Surgeon: [name], MD;  Location: [facility];  Service: Cardiovascular;  Laterality: N/A;  . CYSTOSCOPY N/A 09/08/2016   Procedure: CYSTOSCOPY;  Surgeon: Ward, Honor Loh, MD;  Location: [facility] OR[facility];  Service: Gynecology;  Laterality: N/A;  . EYE SURGERY Left    Cataract Extraction with IOL  . KNEE ARTHROSCOPY W/ MENISCAL REPAIR Right   . LAPAROSCOPIC BILATERAL SALPINGO OOPHERECTOMY Bilateral 09/08/2016   Procedure: LAPAROSCOPIC BILATERAL SALPINGO OOPHORECTOMY;  Surgeon: Ward, Honor Loh, MD;  Location: [facility];  Service: Gynecology;  Laterality: Bilateral;  . LEFT HEART CATH AND CORONARY ANGIOGRAPHY Left 02/24/2018   Procedure: LEFT HEART CATH AND CORONARY ANGIOGRAPHY;  Surgeon: [name], MD;  Location: [facility];  Service: Cardiovascular;  Laterality: Left;  Marland Kitchen MASTECTOMY Right 2000   BREAST CA    Home Medications:  Allergies as of 07/01/2019      Reactions  Doxazosin Other (See Comments)   "headache" Other reaction(s): Headache   Fish Allergy Anaphylaxis, Itching, Nausea Only, Swelling   She states her tongue turns black. She states her tongue turns black.   Dicloxacillin Nausea And Vomiting   Has patient had a PCN reaction causing immediate rash, facial/tongue/throat swelling, SOB or lightheadedness with hypotension: No Has patient had a PCN  reaction causing severe rash involving mucus membranes or skin necrosis: No Has patient had a PCN reaction that required hospitalization: Unknown Has patient had a PCN reaction occurring within the last 10 years: Unknown If all of the above answers are "NO", then may proceed with Cephalosporin use. Other reaction(s): Vomiting   Iodine Hives   Per patient, she had a previous reaction/hives from an iodine injection Per patient, she had a previous reaction/hives from an iodine injection   Sulfa Antibiotics Swelling   Loss of appetite   Constipation bloating Other reaction(s): Headache Loss of appetit   Constipation bloating   Beta Adrenergic Blockers Other (See Comments)   "Unknown"   Cardura [doxazosin Mesylate]    Headache    Etodolac Itching, Swelling   Sulfamethoxazole-trimethoprim Nausea Only   Azithromycin Itching   Clopidogrel Nausea And Vomiting   Colchicine Nausea Only, Other (See Comments)   Dizzness   Glucosamine Nausea And Vomiting, Other (See Comments)   Leflunomide Diarrhea, Itching   Meloxicam Itching   Methotrexate Nausea Only   Zoloft [sertraline] Nausea And Vomiting      Medication List       Accurate as of July 01, 2019 10:05 AM. If you have any questions, ask your nurse or doctor.        acetaminophen 325 MG tablet Commonly known as: TYLENOL Take 2 tablets (650 mg total) by mouth every 6 (six) hours as needed for mild pain (or Fever >/= 101).   ALPRAZolam 0.25 MG tablet Commonly known as: XANAX Take 0.25 mg by mouth at bedtime.   amLODipine 5 MG tablet Commonly known as: NORVASC Take by mouth.   aspirin EC 81 MG tablet Take 81 mg by mouth daily.   atorvastatin 80 MG tablet Commonly known as: LIPITOR Take 1 tablet (80 mg total) by mouth daily at 6 PM.   Biotin 800 MCG Tabs Take 1 tablet by mouth daily.   bisacodyl 5 MG EC tablet Commonly known as: DULCOLAX Take 5 mg by mouth daily as needed for moderate constipation.   clopidogrel 75 MG  tablet Commonly known as: PLAVIX Take 1 tablet (75 mg total) by mouth daily.   diphenhydrAMINE 25 MG tablet Commonly known as: BENADRYL Take 25 mg by mouth daily as needed for allergies.   GlucoCom Blood Glucose Monitor Devi by XX route once daily   hydrOXYzine 10 MG tablet Commonly known as: ATARAX/VISTARIL Take 10 mg by mouth daily.   ibuprofen 200 MG tablet Commonly known as: ADVIL Take 600 mg by mouth 2 (two) times daily as needed for moderate pain.   metFORMIN 500 MG tablet Commonly known as: GLUCOPHAGE Take 500 mg by mouth 2 (two) times daily with a meal.   metoprolol succinate 100 MG 24 hr tablet Commonly known as: TOPROL-XL TAKE 1 TABLET EVERY DAY   mupirocin ointment 2 % Commonly known as: BACTROBAN Apply 1 application topically 3 (three) times daily.   MURINE TEARS FOR DRY EYES OP Apply 1 drop to eye daily as needed (dry eyes).   Muscle Rub 10-15 % Crea Apply 1 application topically as needed for muscle pain.  nitroGLYCERIN 0.4 MG SL tablet Commonly known as: NITROSTAT Place 0.4 mg under the tongue every 5 (five) minutes as needed for chest pain.   omeprazole 20 MG capsule Commonly known as: PRILOSEC Take 20 mg by mouth daily.   potassium chloride 10 MEQ tablet Commonly known as: KLOR-CON Take 10 mEq by mouth 2 (two) times daily.   Xeljanz 5 MG Tabs Generic drug: Tofacitinib Citrate Take 5 mg by mouth 2 (two) times daily.       Allergies:  Allergies  Allergen Reactions  . Doxazosin Other (See Comments)    "headache" Other reaction(s): Headache  . Fish Allergy Anaphylaxis, Itching, Nausea Only and Swelling    She states her tongue turns black. She states her tongue turns black.  . Dicloxacillin Nausea And Vomiting    Has patient had a PCN reaction causing immediate rash, facial/tongue/throat swelling, SOB or lightheadedness with hypotension: No Has patient had a PCN reaction causing severe rash involving mucus membranes or skin necrosis:  No Has patient had a PCN reaction that required hospitalization: Unknown Has patient had a PCN reaction occurring within the last 10 years: Unknown If all of the above answers are "NO", then may proceed with Cephalosporin use.  Other reaction(s): Vomiting  . Iodine Hives    Per patient, she had a previous reaction/hives from an iodine injection Per patient, she had a previous reaction/hives from an iodine injection  . Sulfa Antibiotics Swelling    Loss of appetite   Constipation bloating Other reaction(s): Headache Loss of appetit   Constipation bloating  . Beta Adrenergic Blockers Other (See Comments)    "Unknown"  . Cardura [Doxazosin Mesylate]     Headache   . Etodolac Itching and Swelling  . Sulfamethoxazole-Trimethoprim Nausea Only  . Azithromycin Itching  . Clopidogrel Nausea And Vomiting  . Colchicine Nausea Only and Other (See Comments)    Dizzness  . Glucosamine Nausea And Vomiting and Other (See Comments)  . Leflunomide Diarrhea and Itching  . Meloxicam Itching  . Methotrexate Nausea Only  . Zoloft [Sertraline] Nausea And Vomiting    Family History: Family History  Problem Relation Age of Onset  . Diabetes type II Mother   . Hypertension Mother   . Colon polyps Mother   . Breast cancer Maternal Aunt     Social History:  reports that she has never smoked. She has never used smokeless tobacco. She reports that she does not drink alcohol or use drugs.   Physical Exam: BP (!) 141/84   Pulse 72   Ht 5\' 2"  (1.575 m)   Wt 174 lb (78.9 kg)   BMI 31.83 kg/m   Constitutional:  Alert and oriented, No acute distress. HEENT: [place] AT, moist mucus membranes.  Trachea midline, no masses. Cardiovascular: No clubbing, cyanosis, or edema. Respiratory: Normal respiratory effort, no increased work of breathing. Skin: No rashes, bruises or suspicious lesions. Neurologic: Grossly intact, no focal deficits, moving all 4 extremities. Psychiatric: Normal mood and  affect.  Laboratory Data:  Urinalysis:  UA today showed 11-30 WBC and 3-10 RBC  Pertinent Imaging: CLINICAL DATA:  Right flank pain. Patient on anticoagulation. Evaluate for retroperitoneal bleed. Patient with contrast allergy.  EXAM: CT ABDOMEN AND PELVIS WITHOUT CONTRAST  TECHNIQUE: Multidetector CT imaging of the abdomen and pelvis was performed following the standard protocol without IV contrast.  COMPARISON:  01/02/2015  FINDINGS: Lower chest: Lung bases are normal.  Hepatobiliary: Gallbladder, liver and biliary tree are normal.  Pancreas: There is subtle ill  definition of the peripancreatic fat planes adjacent the head and body of the pancreas. Findings may be seen and mild acute pancreatitis.  Spleen: Normal.  Adrenals/Urinary Tract: Adrenal glands are normal. Kidneys are normal in size with mild right-sided nephrolithiasis. No significant hydronephrosis. Ureters and bladder are normal.  Stomach/Bowel: Stomach and small bowel are normal. Appendix is not visualized. Mild colonic diverticulosis.  Vascular/Lymphatic: Mild calcified plaque over the abdominal aorta and iliac arteries. No significant adenopathy.  Reproductive: Previous hysterectomy.  Other: Subtle hazy appearance to the mid mesentery. Small amount of free fluid over the right mid abdomen. Subtle stranding of the fat in the right inguinal region.  Musculoskeletal: Mild degenerative change of the spine. Mild to moderate compression fracture of L4 age indeterminate but new compared to 2016.  IMPRESSION: Subtle changes as described which may be due to mild acute pancreatitis.  Right-sided nephrolithiasis. No evidence of ureteral stones or obstruction.  Colonic diverticulosis.  Aortic Atherosclerosis (ICD10-I70.0).  Moderate L4 compression fracture age indeterminate but new since 2016.   Electronically Signed   By: Marin Olp M.D.   On: 03/07/2018 20:28  I have  personally reviewed the images and agree with radiologist interpretation.   Assessment & Plan:    1. Nephrolithiasis  CT scan from 2019 showed right sided nephrolithiasis  Repeat CT scan scheduled   2. Microscopic hematuria  History of contrast allergy with hives and will order Noncon CT Urinalysis showed microscopic blood and pyuria Urine culture ordered Will schedule for cystoscopy for further evaluation    Abbie Sons, MD  [facility] [address] [place], [place] [address] [phone]  I, Lucas Mallow, am acting as a scribe for Dr. Nicki Reaper C. Sarh Kirschenbaum,  I have reviewed the above documentation for accuracy and completeness, and I agree with the above.   Abbie Sons, MD

## 2019-07-01 ENCOUNTER — Encounter: Payer: Self-pay | Admitting: Urology

## 2019-07-01 ENCOUNTER — Ambulatory Visit: Payer: Medicare HMO | Admitting: Urology

## 2019-07-01 ENCOUNTER — Other Ambulatory Visit: Payer: Self-pay

## 2019-07-01 VITALS — BP 141/84 | HR 72 | Ht 62.0 in | Wt 174.0 lb

## 2019-07-01 DIAGNOSIS — R310 Gross hematuria: Secondary | ICD-10-CM | POA: Diagnosis not present

## 2019-07-01 DIAGNOSIS — R3129 Other microscopic hematuria: Secondary | ICD-10-CM

## 2019-07-01 DIAGNOSIS — N390 Urinary tract infection, site not specified: Secondary | ICD-10-CM | POA: Diagnosis not present

## 2019-07-01 DIAGNOSIS — N200 Calculus of kidney: Secondary | ICD-10-CM

## 2019-07-01 LAB — URINALYSIS, COMPLETE
Bilirubin, UA: NEGATIVE
Glucose, UA: NEGATIVE
Ketones, UA: NEGATIVE
Leukocytes,UA: NEGATIVE
Nitrite, UA: NEGATIVE
Specific Gravity, UA: 1.02 (ref 1.005–1.030)
Urobilinogen, Ur: 0.2 mg/dL (ref 0.2–1.0)
pH, UA: 7 (ref 5.0–7.5)

## 2019-07-01 LAB — MICROSCOPIC EXAMINATION

## 2019-07-11 DIAGNOSIS — M1711 Unilateral primary osteoarthritis, right knee: Secondary | ICD-10-CM | POA: Diagnosis not present

## 2019-07-11 DIAGNOSIS — R399 Unspecified symptoms and signs involving the genitourinary system: Secondary | ICD-10-CM | POA: Diagnosis not present

## 2019-07-11 DIAGNOSIS — R8290 Unspecified abnormal findings in urine: Secondary | ICD-10-CM | POA: Diagnosis not present

## 2019-07-11 DIAGNOSIS — M059 Rheumatoid arthritis with rheumatoid factor, unspecified: Secondary | ICD-10-CM | POA: Diagnosis not present

## 2019-07-11 DIAGNOSIS — Z79899 Other long term (current) drug therapy: Secondary | ICD-10-CM | POA: Diagnosis not present

## 2019-07-15 ENCOUNTER — Other Ambulatory Visit: Payer: Self-pay | Admitting: Internal Medicine

## 2019-07-29 ENCOUNTER — Other Ambulatory Visit: Payer: Self-pay

## 2019-07-29 ENCOUNTER — Ambulatory Visit
Admission: RE | Admit: 2019-07-29 | Discharge: 2019-07-29 | Disposition: A | Discharge status: Home / Self Care | Payer: Medicare HMO | Source: Ambulatory Visit | Attending: Urology | Admitting: Urology

## 2019-07-29 DIAGNOSIS — N200 Calculus of kidney: Secondary | ICD-10-CM | POA: Diagnosis not present

## 2019-07-29 DIAGNOSIS — R3129 Other microscopic hematuria: Secondary | ICD-10-CM | POA: Diagnosis not present

## 2019-08-02 ENCOUNTER — Encounter: Payer: Self-pay | Admitting: Urology

## 2019-08-02 ENCOUNTER — Telehealth: Payer: Self-pay | Admitting: Urology

## 2019-08-02 ENCOUNTER — Other Ambulatory Visit: Payer: Self-pay

## 2019-08-02 ENCOUNTER — Ambulatory Visit (INDEPENDENT_AMBULATORY_CARE_PROVIDER_SITE_OTHER): Payer: Medicare HMO | Admitting: Urology

## 2019-08-02 ENCOUNTER — Ambulatory Visit: Payer: Medicare HMO

## 2019-08-02 VITALS — BP 146/83 | HR 65 | Ht 62.0 in | Wt 175.0 lb

## 2019-08-02 DIAGNOSIS — R102 Pelvic and perineal pain: Secondary | ICD-10-CM | POA: Diagnosis not present

## 2019-08-02 DIAGNOSIS — R3129 Other microscopic hematuria: Secondary | ICD-10-CM

## 2019-08-02 DIAGNOSIS — N390 Urinary tract infection, site not specified: Secondary | ICD-10-CM | POA: Diagnosis not present

## 2019-08-02 LAB — MICROSCOPIC EXAMINATION

## 2019-08-02 LAB — URINALYSIS, COMPLETE
Bilirubin, UA: NEGATIVE
Glucose, UA: NEGATIVE
Ketones, UA: NEGATIVE
Leukocytes,UA: NEGATIVE
Nitrite, UA: NEGATIVE
Protein,UA: NEGATIVE
Specific Gravity, UA: 1.02 (ref 1.005–1.030)
Urobilinogen, Ur: 2 mg/dL — ABNORMAL HIGH (ref 0.2–1.0)
pH, UA: 7 (ref 5.0–7.5)

## 2019-08-02 LAB — BLADDER SCAN AMB NON-IMAGING: Scan Result: 2

## 2019-08-02 NOTE — Telephone Encounter (Addendum)
Pt called asking about CT results the proceeded with having symptoms of possible UTI, pt c/o pain and burning w/urination, low back pain, nausea, chills, achy all over but also states she has RA so achy could also be her RA per pt. She's not real sure. Pt also c/o of being swollen and very sore in private area.  Appt made.

## 2019-08-03 NOTE — Progress Notes (Signed)
08/02/2019 4:50 PM   Elaine Cox [birth year] [id number]  Referring provider: Tracie Harrier, MD [address] [facility] [place],  [place] [address]  Chief Complaint  Patient presents with  . Recurrent UTI    HPI: [name] is a 74 year old female with microscopic hematuria, right renal stones and personal history of rUTI's who presents today for burning and pain with urination, feeling of swelling in her private area, nausea, achy all over and chills.  She was recently seen by Dr. Bernardo Heater on July 01, 2019 for management of microscopic hematuria.   At that appointment, a repeat CT scan was scheduled along with cystoscopy.  A non contrast CT was performed due to patient's contrast allergies on 07/29/2019 Several small nonobstructing right renal calculi are seen measuring up to 5 mm. No evidence of ureteral calculi or hydronephrosis.  Cystoscopy is scheduled for 08/10/2019 with Dr. Bernardo Heater.   She states that over the last month she has been having frequency, urgency, painful urination, incontinence, lower back pain, lower abdominal pain, vaginal pain the feeling of bloating in the genital area, nausea, chills, night sweats and constipation.    She had restarted her statin medication about one month ago and feels that some of these symptoms may be related to side effects of that medication.    She also states that she has been on medication for bladder infections and yeast infections over the last month, but I have no documentation of cultures being performed.   She did mention being seen at [facility] 1 of these infections and was told it was due to E. Coli.  Today her urine is yellow cloudy, on dip specific gravity 1.020, 1+plus blood, pH 7.0 and microscopically 0-5 WBCs, 3-10 RBCs, 0-10 epithelial cells, hyaline cysts present, bladder bacteria and yeast are present.  Her PVR is 2 mL.  PMH: Past Medical History:  Diagnosis Date  . Anginal pain  ([place]) 06/2016   being followed by dr. Ubaldo Glassing  . Anxiety   . Arthritis    Osteoarthritis  . Arthritis    Rheumatoid  . Breast cancer ([place]) 2000   Right Breast - Chemotherapy  . 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  . Coronary artery disease   . Diabetes mellitus without complication [facility])    Patient takes Metformin  . Dyspnea 03/2017  . GERD (gastroesophageal reflux disease)   . History of abdominal hysterectomy   . History of eyelid surgery   . History of kidney stones   . Hyperlipidemia   . Hypertension   . Lumbago   . Myocardial infarction ([place]) 07/2010  . Orthopnea 03/2017  . Personal history of chemotherapy 2000   right breast ca  . Sinus disorder   . Stroke [facility]) 2006, 2012  . Wears dentures    full upper and lower    Surgical History: Past Surgical History:  Procedure Laterality Date  . ABDOMINAL HYSTERECTOMY    . BREAST SURGERY Right    mastectomy   . CARDIAC CATHETERIZATION  2012   stent placed  . CATARACT EXTRACTION W/PHACO Right 06/24/2017   Procedure: CATARACT EXTRACTION PHACO AND INTRAOCULAR LENS PLACEMENT ([place]) COMPLICATED RIGHT DIABETIC;  Surgeon: Leandrew Koyanagi, MD;  Location: [place];  Service: Ophthalmology;  Laterality: Right;  Diabetes - oral med  . COLONOSCOPY    . COLONOSCOPY WITH PROPOFOL N/A 12/08/2017   Procedure: COLONOSCOPY WITH PROPOFOL;  Surgeon: Alice Reichert, Benay Pike, MD;  Location: [facility]  ENDOSCOPY;  Service: Gastroenterology;  Laterality: N/A;  . CORONARY ANGIOPLASTY    . CORONARY STENT INTERVENTION N/A 02/24/2018   Procedure: CORONARY STENT INTERVENTION;  Surgeon: [name], MD;  Location: [place] CV LAB;  Service: Cardiovascular;  Laterality: N/A;  . CYSTOSCOPY N/A 09/08/2016   Procedure: CYSTOSCOPY;  Surgeon: Ward, Honor Loh, MD;  Location: [facility] ORS;  Service: Gynecology;  Laterality: N/A;  . EYE SURGERY Left    Cataract Extraction with IOL  . KNEE ARTHROSCOPY W/ MENISCAL REPAIR Right   .  LAPAROSCOPIC BILATERAL SALPINGO OOPHERECTOMY Bilateral 09/08/2016   Procedure: LAPAROSCOPIC BILATERAL SALPINGO OOPHORECTOMY;  Surgeon: Ward, Honor Loh, MD;  Location: [facility] ORS;  Service: Gynecology;  Laterality: Bilateral;  . LEFT HEART CATH AND CORONARY ANGIOGRAPHY Left 02/24/2018   Procedure: LEFT HEART CATH AND CORONARY ANGIOGRAPHY;  Surgeon: [name], MD;  Location: [place] [facility];  Service: Cardiovascular;  Laterality: Left;  Marland Kitchen MASTECTOMY Right 2000   BREAST CA    Home Medications:  Allergies as of 08/02/2019      Reactions   Doxazosin Other (See Comments)   "headache" Other reaction(s): Headache   Fish Allergy Anaphylaxis, Itching, Nausea Only, Swelling   She states her tongue turns black. She states her tongue turns black.   Dicloxacillin Nausea And Vomiting   Has patient had a PCN reaction causing immediate rash, facial/tongue/throat swelling, SOB or lightheadedness with hypotension: No Has patient had a PCN reaction causing severe rash involving mucus membranes or skin necrosis: No Has patient had a PCN reaction that required hospitalization: Unknown Has patient had a PCN reaction occurring within the last 10 years: Unknown If all of the above answers are "NO", then may proceed with Cephalosporin use. Other reaction(s): Vomiting   Iodine Hives   Per patient, she had a previous reaction/hives from an iodine injection Per patient, she had a previous reaction/hives from an iodine injection   Sulfa Antibiotics Swelling   Loss of appetite   Constipation bloating Other reaction(s): Headache Loss of appetit   Constipation bloating   Beta Adrenergic Blockers Other (See Comments)   "Unknown"   Cardura [doxazosin Mesylate]    Headache    Etodolac Itching, Swelling   Sulfamethoxazole-trimethoprim Nausea Only   Azithromycin Itching   Clopidogrel Nausea And Vomiting   Colchicine Nausea Only, Other (See Comments)   Dizzness   Glucosamine Nausea And Vomiting, Other  (See Comments)   Leflunomide Diarrhea, Itching   Meloxicam Itching   Methotrexate Nausea Only   Zoloft [sertraline] Nausea And Vomiting      Medication List       Accurate as of August 02, 2019 11:59 PM. If you have any questions, ask your nurse or doctor.        acetaminophen 325 MG tablet Commonly known as: TYLENOL Take 2 tablets (650 mg total) by mouth every 6 (six) hours as needed for mild pain (or Fever >/= 101).   ALPRAZolam 0.25 MG tablet Commonly known as: XANAX Take 0.25 mg by mouth at bedtime.   amLODipine 5 MG tablet Commonly known as: NORVASC Take by mouth.   aspirin EC 81 MG tablet Take 81 mg by mouth daily.   atorvastatin 80 MG tablet Commonly known as: LIPITOR Take 1 tablet (80 mg total) by mouth daily at 6 PM.   Biotin 800 MCG Tabs Take 1 tablet by mouth daily.   bisacodyl 5 MG EC tablet Commonly known as: DULCOLAX Take 5 mg by mouth daily as needed for moderate constipation.  clopidogrel 75 MG tablet Commonly known as: PLAVIX Take 1 tablet (75 mg total) by mouth daily.   diphenhydrAMINE 25 MG tablet Commonly known as: BENADRYL Take 25 mg by mouth daily as needed for allergies.   GlucoCom Blood Glucose Monitor Devi by XX route once daily   hydrOXYzine 10 MG tablet Commonly known as: ATARAX/VISTARIL Take 10 mg by mouth daily.   ibuprofen 200 MG tablet Commonly known as: ADVIL Take 600 mg by mouth 2 (two) times daily as needed for moderate pain.   metFORMIN 500 MG tablet Commonly known as: GLUCOPHAGE Take 500 mg by mouth 2 (two) times daily with a meal.   metoprolol succinate 100 MG 24 hr tablet Commonly known as: TOPROL-XL TAKE 1 TABLET EVERY DAY   mupirocin ointment 2 % Commonly known as: BACTROBAN Apply 1 application topically 3 (three) times daily.   MURINE TEARS FOR DRY EYES OP Apply 1 drop to eye daily as needed (dry eyes).   Muscle Rub 10-15 % Crea Apply 1 application topically as needed for muscle pain.   nitroGLYCERIN  0.4 MG SL tablet Commonly known as: NITROSTAT Place 0.4 mg under the tongue every 5 (five) minutes as needed for chest pain.   omeprazole 20 MG capsule Commonly known as: PRILOSEC Take 20 mg by mouth daily.   potassium chloride 10 MEQ tablet Commonly known as: KLOR-CON Take 10 mEq by mouth 2 (two) times daily.   Xeljanz 5 MG Tabs Generic drug: Tofacitinib Citrate Take 5 mg by mouth 2 (two) times daily.       Allergies:  Allergies  Allergen Reactions  . Doxazosin Other (See Comments)    "headache" Other reaction(s): Headache  . Fish Allergy Anaphylaxis, Itching, Nausea Only and Swelling    She states her tongue turns black. She states her tongue turns black.  . Dicloxacillin Nausea And Vomiting    Has patient had a PCN reaction causing immediate rash, facial/tongue/throat swelling, SOB or lightheadedness with hypotension: No Has patient had a PCN reaction causing severe rash involving mucus membranes or skin necrosis: No Has patient had a PCN reaction that required hospitalization: Unknown Has patient had a PCN reaction occurring within the last 10 years: Unknown If all of the above answers are "NO", then may proceed with Cephalosporin use.  Other reaction(s): Vomiting  . Iodine Hives    Per patient, she had a previous reaction/hives from an iodine injection Per patient, she had a previous reaction/hives from an iodine injection  . Sulfa Antibiotics Swelling    Loss of appetite   Constipation bloating Other reaction(s): Headache Loss of appetit   Constipation bloating  . Beta Adrenergic Blockers Other (See Comments)    "Unknown"  . Cardura [Doxazosin Mesylate]     Headache   . Etodolac Itching and Swelling  . Sulfamethoxazole-Trimethoprim Nausea Only  . Azithromycin Itching  . Clopidogrel Nausea And Vomiting  . Colchicine Nausea Only and Other (See Comments)    Dizzness  . Glucosamine Nausea And Vomiting and Other (See Comments)  . Leflunomide Diarrhea and Itching   . Meloxicam Itching  . Methotrexate Nausea Only  . Zoloft [Sertraline] Nausea And Vomiting    Family History: Family History  Problem Relation Age of Onset  . Diabetes type II Mother   . Hypertension Mother   . Colon polyps Mother   . Breast cancer Maternal Aunt     Social History:  reports that she has never smoked. She has never used smokeless tobacco. She reports that she  does not drink alcohol or use drugs.  ROS: Pertinent ROS in HPI  Physical Exam: BP (!) 146/83   Pulse 65   Ht 5\' 2"  (1.575 m)   Wt 175 lb (79.4 kg)   BMI 32.01 kg/m   Constitutional:  Well nourished. Alert and oriented, No acute distress. HEENT: [place] AT, mask in place.  Trachea midline. Cardiovascular: No clubbing, cyanosis, or edema. Respiratory: Normal respiratory effort, no increased work of breathing. GI: Abdomen is soft, non tender, non distended, no abdominal masses.  GU: No CVA tenderness.  No bladder fullness or masses.  Atrophic external genitalia, nromal pubic hair distribution, no lesions.  Normal urethral meatus, no lesions, no prolapse, no discharge.   No urethral masses, tenderness and/or tenderness. No bladder fullness, tenderness or masses. Pale vagina mucosa, fair estrogen effect, no discharge, no lesions, good pelvic support, no cystocele and no rectocele noted.  Uterus are surgically absent.  Adnexa are surgically absent.  Anus and perineum are without rashes or lesions.    Skin: No rashes, bruises or suspicious lesions. Lymph: No cervical or inguinal adenopathy. Neurologic: Grossly intact, no focal deficits, moving all 4 extremities. Psychiatric: Normal mood and affect.   Laboratory Data: Lab Results  Component Value Date   WBC 8.7 03/08/2018   HGB 11.5 (L) 03/08/2018   HCT 31.8 (L) 03/08/2018   MCV 84.6 03/08/2018   PLT 293 03/08/2018    Lab Results  Component Value Date   CREATININE 0.61 03/09/2018    Lab Results  Component Value Date   HGBA1C 6.2 (H) 02/23/2018     Lab Results  Component Value Date   AST 18 03/07/2018   Lab Results  Component Value Date   ALT 10 03/07/2018    Urinalysis Component     Latest Ref Rng & Units 08/02/2019  Specific Gravity, UA     1.005 - 1.030 1.020  pH, UA     5.0 - 7.5 7.0  Color, UA     Yellow Yellow  Appearance Ur     Clear Cloudy (A)  Leukocytes,UA     Negative Negative  Protein,UA     Negative/Trace Negative  Glucose, UA     Negative Negative  Ketones, UA     Negative Negative  RBC, UA     Negative 1+ (A)  Bilirubin, UA     Negative Negative  Urobilinogen, Ur     0.2 - 1.0 mg/dL 2.0 (H)  Nitrite, UA     Negative Negative  Microscopic Examination      See below:   Component     Latest Ref Rng & Units 08/02/2019  WBC, UA     0 - 5 /hpf 0-5  RBC     0 - 2 /hpf 3-10 (A)  Epithelial Cells (non renal)     0 - 10 /hpf 0-10  Casts     None seen /lpf Present (A)  Cast Type     N/A Hyaline casts  Bacteria, UA     None seen/Few Moderate (A)  Yeast, UA     None seen Present (A)    I have reviewed the labs.   Pertinent Imaging: CLINICAL DATA:  Bilateral flank pain for 1 month. Microscopic hematuria. Dysuria. Nephrolithiasis.  EXAM: CT ABDOMEN AND PELVIS WITHOUT CONTRAST  TECHNIQUE: Multidetector CT imaging of the abdomen and pelvis was performed following the standard protocol without IV contrast.  COMPARISON:  03/07/2018  FINDINGS: Lower chest: No acute findings.  Hepatobiliary: No mass  visualized on this unenhanced exam. Gallbladder is unremarkable. No evidence of biliary ductal dilatation.  Pancreas: No mass or inflammatory process visualized on this unenhanced exam.  Spleen:  Within normal limits in size.  Adrenals/Urinary tract: Several small nonobstructing right renal calculi are seen measuring up to 5 mm. No evidence of ureteral calculi or hydronephrosis.  Stomach/Bowel: No evidence of obstruction, inflammatory process, or abnormal fluid  collections. Diverticulosis is seen mainly involving the sigmoid colon, however there is no evidence of diverticulitis.  Vascular/Lymphatic: No pathologically enlarged lymph nodes identified. No evidence of abdominal aortic aneurysm. Aortic atherosclerosis incidentally noted.  Reproductive: Prior hysterectomy noted. Adnexal regions are unremarkable in appearance.  Other:  None.  Musculoskeletal: No suspicious bone lesions identified. Stable old L4 vertebral body compression deformity.  IMPRESSION: 1. Right nephrolithiasis. No evidence of ureteral calculi, hydronephrosis, or other acute findings. 2. Colonic diverticulosis, without radiographic evidence of diverticulitis.  Aortic Atherosclerosis (ICD10-I70.0).   Electronically Signed   By: Marlaine Hind M.D.   On: 07/30/2019 15:32 I have independently reviewed the films with the patient and explained that her current symptoms where not caused by her right renal stones.    Assessment & Plan:   1. Microscopic hematuria Today's UA with 3-10 RBC's Urine culture is pending Non-contrast CT identified right renal atones Cystoscopy pending  2. Pelvic pain Pelvic exam is normal PVR is 2 cc Cystoscopy is pending - Bladder Scan (Post Void Residual) in office - Urinalysis, Complete - CULTURE, URINE COMPREHENSIVE   Return for Keep cystoscopy appointment with Dr. Bernardo Heater on 08/10/2019 .  These notes generated with voice recognition software. I apologize for typographical errors.  Zara Council, PA-C  [facility] [address]  [place], [place] [address] [phone]

## 2019-08-05 ENCOUNTER — Telehealth: Payer: Self-pay | Admitting: Family Medicine

## 2019-08-05 LAB — CULTURE, URINE COMPREHENSIVE

## 2019-08-05 NOTE — Telephone Encounter (Signed)
-----   Message from Nori Riis, PA-C sent at 08/05/2019  8:09 AM EDT ----- Please let Mrs. Roller know that her urine culture was negative.

## 2019-08-05 NOTE — Telephone Encounter (Signed)
Patient notified and voiced understanding.

## 2019-08-10 ENCOUNTER — Other Ambulatory Visit: Payer: Self-pay

## 2019-08-10 ENCOUNTER — Ambulatory Visit (INDEPENDENT_AMBULATORY_CARE_PROVIDER_SITE_OTHER): Payer: Medicare HMO | Admitting: Urology

## 2019-08-10 ENCOUNTER — Encounter: Payer: Self-pay | Admitting: Urology

## 2019-08-10 VITALS — BP 144/82 | HR 67 | Ht 62.0 in | Wt 175.0 lb

## 2019-08-10 DIAGNOSIS — N200 Calculus of kidney: Secondary | ICD-10-CM

## 2019-08-10 DIAGNOSIS — R3129 Other microscopic hematuria: Secondary | ICD-10-CM | POA: Diagnosis not present

## 2019-08-10 NOTE — Progress Notes (Signed)
   08/10/19  CC:  Chief Complaint  Patient presents with  . Cysto   Indications: Microscopic hematuria  HPI: 74 y.o. female with microscopic hematuria, intermittent pelvic pain, dysuria.  CT showed nonobstructing right renal calculi.  She saw Larene Beach 4/27 for possible UTI and urine culture grew lactobacillus.  Blood pressure (!) 144/82, pulse 67, height 5\' 2"  (1.575 m), weight 175 lb (79.4 kg). NED. A&Ox3.   No respiratory distress   Abd soft, NT, ND Atrophic external genitalia with patent urethral meatus  Cystoscopy Procedure Note  Patient identification was confirmed, informed consent was obtained, and patient was prepped using Betadine solution.  Lidocaine jelly was administered per urethral meatus.    Procedure: - Flexible cystoscope introduced, without any difficulty.   - Thorough search of the bladder revealed:    normal urethral meatus    normal urothelium    no stones    no ulcers     no tumors    no urethral polyps    no trabeculation  - Ureteral orifices were normal in position and appearance.  Post-Procedure: - Patient tolerated the procedure well  Assessment/ Plan: -No significant abnormalities on cystoscopy -Nonobstructing right renal calculi -Follow-up 6 months with Larene Beach for UA and KUB   Abbie Sons, MD

## 2019-08-11 LAB — MICROSCOPIC EXAMINATION: Epithelial Cells (non renal): >10 /hpf — AB (ref 0–10)

## 2019-08-11 LAB — URINALYSIS, COMPLETE
Bilirubin, UA: NEGATIVE
Glucose, UA: NEGATIVE
Ketones, UA: NEGATIVE
Leukocytes,UA: NEGATIVE
Nitrite, UA: NEGATIVE
Specific Gravity, UA: 1.02 (ref 1.005–1.030)
Urobilinogen, Ur: 1 mg/dL (ref 0.2–1.0)
pH, UA: 7 (ref 5.0–7.5)

## 2019-09-26 DIAGNOSIS — R399 Unspecified symptoms and signs involving the genitourinary system: Secondary | ICD-10-CM | POA: Diagnosis not present

## 2019-09-26 DIAGNOSIS — E1165 Type 2 diabetes mellitus with hyperglycemia: Secondary | ICD-10-CM | POA: Diagnosis not present

## 2019-09-26 DIAGNOSIS — F419 Anxiety disorder, unspecified: Secondary | ICD-10-CM | POA: Diagnosis not present

## 2019-09-26 DIAGNOSIS — R8290 Unspecified abnormal findings in urine: Secondary | ICD-10-CM | POA: Diagnosis not present

## 2019-09-26 DIAGNOSIS — M059 Rheumatoid arthritis with rheumatoid factor, unspecified: Secondary | ICD-10-CM | POA: Diagnosis not present

## 2019-09-26 DIAGNOSIS — I2510 Atherosclerotic heart disease of native coronary artery without angina pectoris: Secondary | ICD-10-CM | POA: Diagnosis not present

## 2019-09-26 DIAGNOSIS — J3489 Other specified disorders of nose and nasal sinuses: Secondary | ICD-10-CM | POA: Diagnosis not present

## 2019-09-26 DIAGNOSIS — I10 Essential (primary) hypertension: Secondary | ICD-10-CM | POA: Diagnosis not present

## 2019-09-26 DIAGNOSIS — M8949 Other hypertrophic osteoarthropathy, multiple sites: Secondary | ICD-10-CM | POA: Diagnosis not present

## 2019-09-26 DIAGNOSIS — R3129 Other microscopic hematuria: Secondary | ICD-10-CM | POA: Diagnosis not present

## 2019-09-28 DIAGNOSIS — E119 Type 2 diabetes mellitus without complications: Secondary | ICD-10-CM | POA: Diagnosis not present

## 2019-09-28 DIAGNOSIS — M1711 Unilateral primary osteoarthritis, right knee: Secondary | ICD-10-CM | POA: Diagnosis not present

## 2019-09-28 DIAGNOSIS — I10 Essential (primary) hypertension: Secondary | ICD-10-CM | POA: Diagnosis not present

## 2019-09-28 DIAGNOSIS — E785 Hyperlipidemia, unspecified: Secondary | ICD-10-CM | POA: Diagnosis not present

## 2019-09-28 DIAGNOSIS — I2510 Atherosclerotic heart disease of native coronary artery without angina pectoris: Secondary | ICD-10-CM | POA: Diagnosis not present

## 2019-09-28 DIAGNOSIS — F418 Other specified anxiety disorders: Secondary | ICD-10-CM | POA: Diagnosis not present

## 2019-09-28 DIAGNOSIS — K219 Gastro-esophageal reflux disease without esophagitis: Secondary | ICD-10-CM | POA: Diagnosis not present

## 2019-09-28 DIAGNOSIS — I252 Old myocardial infarction: Secondary | ICD-10-CM | POA: Diagnosis not present

## 2019-09-28 DIAGNOSIS — M25522 Pain in left elbow: Secondary | ICD-10-CM | POA: Diagnosis not present

## 2019-09-29 ENCOUNTER — Other Ambulatory Visit: Payer: Self-pay

## 2019-09-29 ENCOUNTER — Ambulatory Visit: Payer: Medicare HMO | Admitting: Physician Assistant

## 2019-09-29 ENCOUNTER — Ambulatory Visit
Admission: RE | Admit: 2019-09-29 | Discharge: 2019-09-29 | Disposition: A | Discharge status: Home / Self Care | Payer: Medicare HMO | Source: Ambulatory Visit | Attending: Urology | Admitting: Urology

## 2019-09-29 VITALS — BP 157/78 | HR 69

## 2019-09-29 DIAGNOSIS — N200 Calculus of kidney: Secondary | ICD-10-CM | POA: Diagnosis not present

## 2019-09-29 DIAGNOSIS — R3129 Other microscopic hematuria: Secondary | ICD-10-CM | POA: Diagnosis not present

## 2019-09-29 NOTE — Progress Notes (Signed)
09/29/2019 3:58 PM   Elaine Cox [birth year] [id number]  CC: Chief Complaint  Patient presents with  . Nephrolithiasis    HPI: Elaine Cox is a 74 y.o. female with PMH microscopic hematuria, recurrent UTI, and recurrent nephrolithiasis who presents today to discuss definitive management of her 3 known nonobstructing right renal stones.  Today, patient reports a month-long history of diffuse body aches and discomfort.  She describes aching in her bilateral rib cage, shoulders, and elbows, pain in her back radiating to her right leg, right knee pain with plans for possible orthopedic surgery, dysuria, urgency, and the sensation of genital swelling.  Notably, she has a history of rheumatoid arthritis.  She has a personal history of breast cancer.  She denies fever, chills, and vomiting.  CT stone study dated 07/30/2019 revealed 3 nonobstructing right renal stones measuring up to 5 mm.  KUB today reveals stable right renal stones and multiple stable pelvic calcifications seen on KUB dated 07/26/2008.  In-office UA today positive for 2+ blood; urine microscopy with >10 epithelial cells.  PMH: Past Medical History:  Diagnosis Date  . Anginal pain ([place]) 06/2016   being followed by dr. Ubaldo Glassing  . Anxiety   . Arthritis    Osteoarthritis  . Arthritis    Rheumatoid  . Breast cancer ([place]) 2000   Right Breast - Chemotherapy  . 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  . Coronary artery disease   . Diabetes mellitus without complication [facility])    Patient takes Metformin  . Dyspnea 03/2017  . GERD (gastroesophageal reflux disease)   . History of abdominal hysterectomy   . History of eyelid surgery   . History of kidney stones   . Hyperlipidemia   . Hypertension   . Lumbago   . Myocardial infarction ([place]) 07/2010  . Orthopnea 03/2017  . Personal history of chemotherapy 2000   right breast ca  . Sinus disorder   . Stroke [facility]) 2006, 2012  . Wears  dentures    full upper and lower    Surgical History: Past Surgical History:  Procedure Laterality Date  . ABDOMINAL HYSTERECTOMY    . BREAST SURGERY Right    mastectomy   . CARDIAC CATHETERIZATION  2012   stent placed  . CATARACT EXTRACTION W/PHACO Right 06/24/2017   Procedure: CATARACT EXTRACTION PHACO AND INTRAOCULAR LENS PLACEMENT ([place]) COMPLICATED RIGHT DIABETIC;  Surgeon: Leandrew Koyanagi, MD;  Location: [place];  Service: Ophthalmology;  Laterality: Right;  Diabetes - oral med  . COLONOSCOPY    . COLONOSCOPY WITH PROPOFOL N/A 12/08/2017   Procedure: COLONOSCOPY WITH PROPOFOL;  Surgeon: Toledo, Benay Pike, MD;  Location: [facility] ENDOS[facility];  Service: Gastroenterology;  Laterality: N/A;  . CORONARY ANGIOPLASTY    . CORONARY STENT INTERVENTION N/A 02/24/2018   Procedure: CORONARY STENT INTERVENTION;  Surgeon: [name], MD;  Location: [place] CV LAB;  Service: Cardiovascular;  Laterality: N/A;  . CYSTOSCOPY N/A 09/08/2016   Procedure: CYSTOSCOPY;  Surgeon: Ward, Honor Loh, MD;  Location: [facility] ORS;  Service: Gynecology;  Laterality: N/A;  . EYE SURGERY Left    Cataract Extraction with IOL  . KNEE ARTHROSCOPY W/ MENISCAL REPAIR Right   . LAPAROSCOPIC BILATERAL SALPINGO OOPHERECTOMY Bilateral 09/08/2016   Procedure: LAPAROSCOPIC BILATERAL SALPINGO OOPHORECTOMY;  Surgeon: Ward, Honor Loh, MD;  Location: [facility] OR[facility];  Service: Gynecology;  Laterality: Bilateral;  . LEFT HEART CATH AND CORONARY ANGIOGRAPHY Left 02/24/2018   Procedure: LEFT HEART CATH AND CORONARY ANGIOGRAPHY;  Surgeon: [name], MD;  Location: [place] CV LAB;  Service: Cardiovascular;  Laterality: Left;  Marland Kitchen MASTECTOMY Right 2000   BREAST CA    Home Medications:  Allergies as of 09/29/2019      Reactions   Doxazosin Other (See Comments)   "headache" Other reaction(s): Headache   Fish Allergy Anaphylaxis, Itching, Nausea Only, Swelling   She states her tongue turns black. She  states her tongue turns black.   Dicloxacillin Nausea And Vomiting   Has patient had a PCN reaction causing immediate rash, facial/tongue/throat swelling, SOB or lightheadedness with hypotension: No Has patient had a PCN reaction causing severe rash involving mucus membranes or skin necrosis: No Has patient had a PCN reaction that required hospitalization: Unknown Has patient had a PCN reaction occurring within the last 10 years: Unknown If all of the above answers are "NO", then may proceed with Cephalosporin use. Other reaction(s): Vomiting   Iodine Hives   Per patient, she had a previous reaction/hives from an iodine injection Per patient, she had a previous reaction/hives from an iodine injection   Sulfa Antibiotics Swelling   Loss of appetite   Constipation bloating Other reaction(s): Headache Loss of appetit   Constipation bloating   Beta Adrenergic Blockers Other (See Comments)   "Unknown"   Cardura [doxazosin Mesylate]    Headache    Etodolac Itching, Swelling   Sulfamethoxazole-trimethoprim Nausea Only   Azithromycin Itching   Clopidogrel Nausea And Vomiting   Colchicine Nausea Only, Other (See Comments)   Dizzness   Glucosamine Nausea And Vomiting, Other (See Comments)   Leflunomide Diarrhea, Itching   Meloxicam Itching   Methotrexate Nausea Only   Zoloft [sertraline] Nausea And Vomiting      Medication List       Accurate as of September 29, 2019 11:59 PM. If you have any questions, ask your nurse or doctor.        acetaminophen 325 MG tablet Commonly known as: TYLENOL Take 2 tablets (650 mg total) by mouth every 6 (six) hours as needed for mild pain (or Fever >/= 101).   ALPRAZolam 0.25 MG tablet Commonly known as: XANAX Take 0.25 mg by mouth at bedtime.   amLODipine 5 MG tablet Commonly known as: NORVASC Take by mouth.   aspirin EC 81 MG tablet Take 81 mg by mouth daily.   atorvastatin 80 MG tablet Commonly known as: LIPITOR Take 1 tablet (80 mg total)  by mouth daily at 6 PM.   Biotin 800 MCG Tabs Take 1 tablet by mouth daily.   bisacodyl 5 MG EC tablet Commonly known as: DULCOLAX Take 5 mg by mouth daily as needed for moderate constipation.   clopidogrel 75 MG tablet Commonly known as: PLAVIX Take 1 tablet (75 mg total) by mouth daily.   diphenhydrAMINE 25 MG tablet Commonly known as: BENADRYL Take 25 mg by mouth daily as needed for allergies.   GlucoCom Blood Glucose Monitor Devi by XX route once daily   hydrOXYzine 10 MG tablet Commonly known as: ATARAX/VISTARIL Take 10 mg by mouth daily.   ibuprofen 200 MG tablet Commonly known as: ADVIL Take 600 mg by mouth 2 (two) times daily as needed for moderate pain.   metFORMIN 500 MG tablet Commonly known as: GLUCOPHAGE Take 500 mg by mouth 2 (two) times daily with a meal.   metoprolol succinate 100 MG 24 hr tablet Commonly known as: TOPROL-XL TAKE 1 TABLET EVERY DAY   mupirocin ointment 2 % Commonly known as: [employer]  Apply 1 application topically 3 (three) times daily.   MURINE TEARS FOR DRY EYES OP Apply 1 drop to eye daily as needed (dry eyes).   Muscle Rub 10-15 % Crea Apply 1 application topically as needed for muscle pain.   nitroGLYCERIN 0.4 MG SL tablet Commonly known as: NITROSTAT Place 0.4 mg under the tongue every 5 (five) minutes as needed for chest pain.   omeprazole 20 MG capsule Commonly known as: PRILOSEC Take 20 mg by mouth daily.   Potassium 75 MG Tabs TAKE 2 TABLETS (40 MEQ TOTAL) BY MOUTH 3 (THREE) TIMES DAILY FOR 10 DAYS   potassium chloride 10 MEQ tablet Commonly known as: KLOR-CON Take 10 mEq by mouth 2 (two) times daily.   potassium chloride 10 MEQ tablet Commonly known as: KLOR-CON   rosuvastatin 5 MG tablet Commonly known as: CRESTOR Take by mouth.   simvastatin 20 MG tablet Commonly known as: ZOCOR Take by mouth.   Xeljanz 5 MG Tabs Generic drug: Tofacitinib Citrate Take 5 mg by mouth 2 (two) times daily.        Allergies:  Allergies  Allergen Reactions  . Doxazosin Other (See Comments)    "headache" Other reaction(s): Headache  . Fish Allergy Anaphylaxis, Itching, Nausea Only and Swelling    She states her tongue turns black. She states her tongue turns black.  . Dicloxacillin Nausea And Vomiting    Has patient had a PCN reaction causing immediate rash, facial/tongue/throat swelling, SOB or lightheadedness with hypotension: No Has patient had a PCN reaction causing severe rash involving mucus membranes or skin necrosis: No Has patient had a PCN reaction that required hospitalization: Unknown Has patient had a PCN reaction occurring within the last 10 years: Unknown If all of the above answers are "NO", then may proceed with Cephalosporin use.  Other reaction(s): Vomiting  . Iodine Hives    Per patient, she had a previous reaction/hives from an iodine injection Per patient, she had a previous reaction/hives from an iodine injection  . Sulfa Antibiotics Swelling    Loss of appetite   Constipation bloating Other reaction(s): Headache Loss of appetit   Constipation bloating  . Beta Adrenergic Blockers Other (See Comments)    "Unknown"  . Cardura [Doxazosin Mesylate]     Headache   . Etodolac Itching and Swelling  . Sulfamethoxazole-Trimethoprim Nausea Only  . Azithromycin Itching  . Clopidogrel Nausea And Vomiting  . Colchicine Nausea Only and Other (See Comments)    Dizzness  . Glucosamine Nausea And Vomiting and Other (See Comments)  . Leflunomide Diarrhea and Itching  . Meloxicam Itching  . Methotrexate Nausea Only  . Zoloft [Sertraline] Nausea And Vomiting    Family History: Family History  Problem Relation Age of Onset  . Diabetes type II Mother   . Hypertension Mother   . Colon polyps Mother   . Breast cancer Maternal Aunt     Social History:   reports that she has never smoked. She has never used smokeless tobacco. She reports that she does not drink alcohol and  does not use drugs.  Physical Exam: BP (!) 157/78   Pulse 69   Constitutional:  Alert and oriented, no acute distress, nontoxic appearing HEENT: , AT Cardiovascular: No clubbing, cyanosis, or edema Respiratory: Normal respiratory effort, no increased work of breathing Skin: No rashes, bruises or suspicious lesions Neurologic: Grossly intact, no focal deficits, moving all 4 extremities Psychiatric: Normal mood and affect  Laboratory Data: Results for orders placed or performed  in visit on 09/29/19  Microscopic Examination   Urine  Result Value Ref Range   WBC, UA 0-5 0 - 5 /hpf   RBC 0-2 0 - 2 /hpf   Epithelial Cells (non renal) >10 (A) 0 - 10 /hpf   Bacteria, UA Few None seen/Few  Urinalysis, Complete  Result Value Ref Range   Specific Gravity, UA 1.015 1.005 - 1.030   pH, UA 6.0 5.0 - 7.5   Color, UA Yellow Yellow   Appearance Ur Hazy (A) Clear   Leukocytes,UA Negative Negative   Protein,UA Negative Negative/Trace   Glucose, UA Negative Negative   Ketones, UA Negative Negative   RBC, UA 2+ (A) Negative   Bilirubin, UA Negative Negative   Urobilinogen, Ur 0.2 0.2 - 1.0 mg/dL   Nitrite, UA Negative Negative   Microscopic Examination See below:    Pertinent Imaging: KUB, 09/29/2019: CLINICAL DATA:  Nephrolithiasis, right-sided pain, hematuria  EXAM: ABDOMEN - 1 VIEW  COMPARISON:  07/26/2008, 07/29/2019  FINDINGS: 2 supine frontal views of the abdomen and pelvis are obtained. Punctate calcifications are seen overlying the right renal silhouette, partially obscured by colonic gas and stool. Overall, no significant change since previous CT.  Extensive vascular calcifications are seen within the pelvis, stable. No bowel obstruction or ileus. Lung bases are clear.  IMPRESSION: 1. Punctate right renal calculi unchanged since prior CT.   Electronically Signed   By: Randa Ngo M.D.   On: 09/30/2019 22:42  I personally reviewed the images referenced  above and note stable right renal stones.  Assessment & Plan:   1. Right renal stone 74 year old female with 3 known nonobstructing right renal stones presents with a months-long history of diffuse body pains wishing to pursue definitive stone management to improve her discomfort.  UA today reassuring for infection versus acute stone episode, KUB also reassuring for acute stone episode.  I had a lengthy conversation with the patient today in which I explained that I do not believe her kidney stones are the cause of her pain.  I explained that we could pursue definitive stone management on an elective basis, however I expect she will not experience relief in her pain as an outcome of this.  Based on these findings, patient would prefer to defer stone management.  I am in agreement with this plan.  We reviewed general stone prevention techniques in clinic today, including increasing fluid intake, decreasing oxalate intake, maintaining moderate calcium intake, and decreasing sodium intake.  Written resources provided today as well. - Urinalysis, Complete   Return if symptoms worsen or fail to improve.  Debroah Loop, PA-C  [facility] [address], [place],  [address] [phone]

## 2019-09-30 LAB — MICROSCOPIC EXAMINATION: Epithelial Cells (non renal): >10 /hpf — AB (ref 0–10)

## 2019-09-30 LAB — URINALYSIS, COMPLETE
Bilirubin, UA: NEGATIVE
Glucose, UA: NEGATIVE
Ketones, UA: NEGATIVE
Leukocytes,UA: NEGATIVE
Nitrite, UA: NEGATIVE
Protein,UA: NEGATIVE
Specific Gravity, UA: 1.015 (ref 1.005–1.030)
Urobilinogen, Ur: 0.2 mg/dL (ref 0.2–1.0)
pH, UA: 6 (ref 5.0–7.5)

## 2019-10-25 DIAGNOSIS — M1711 Unilateral primary osteoarthritis, right knee: Secondary | ICD-10-CM | POA: Diagnosis not present

## 2019-11-03 DIAGNOSIS — I2510 Atherosclerotic heart disease of native coronary artery without angina pectoris: Secondary | ICD-10-CM | POA: Diagnosis not present

## 2019-11-03 DIAGNOSIS — E1165 Type 2 diabetes mellitus with hyperglycemia: Secondary | ICD-10-CM | POA: Diagnosis not present

## 2019-11-03 DIAGNOSIS — I6310 Cerebral infarction due to embolism of unspecified precerebral artery: Secondary | ICD-10-CM | POA: Diagnosis not present

## 2019-11-03 DIAGNOSIS — I10 Essential (primary) hypertension: Secondary | ICD-10-CM | POA: Diagnosis not present

## 2019-11-11 DIAGNOSIS — M05722 Rheumatoid arthritis with rheumatoid factor of left elbow without organ or systems involvement: Secondary | ICD-10-CM | POA: Diagnosis not present

## 2019-11-11 DIAGNOSIS — M8949 Other hypertrophic osteoarthropathy, multiple sites: Secondary | ICD-10-CM | POA: Diagnosis not present

## 2019-11-11 DIAGNOSIS — Z79899 Other long term (current) drug therapy: Secondary | ICD-10-CM | POA: Diagnosis not present

## 2019-11-11 DIAGNOSIS — M05721 Rheumatoid arthritis with rheumatoid factor of right elbow without organ or systems involvement: Secondary | ICD-10-CM | POA: Diagnosis not present

## 2019-11-23 ENCOUNTER — Other Ambulatory Visit: Payer: Self-pay | Admitting: Internal Medicine

## 2019-11-23 DIAGNOSIS — Z1231 Encounter for screening mammogram for malignant neoplasm of breast: Secondary | ICD-10-CM

## 2019-12-02 DIAGNOSIS — I2510 Atherosclerotic heart disease of native coronary artery without angina pectoris: Secondary | ICD-10-CM | POA: Diagnosis not present

## 2019-12-06 DIAGNOSIS — E119 Type 2 diabetes mellitus without complications: Secondary | ICD-10-CM | POA: Diagnosis not present

## 2019-12-06 DIAGNOSIS — R309 Painful micturition, unspecified: Secondary | ICD-10-CM | POA: Diagnosis not present

## 2019-12-06 DIAGNOSIS — N907 Vulvar cyst: Secondary | ICD-10-CM | POA: Diagnosis not present

## 2019-12-08 ENCOUNTER — Other Ambulatory Visit: Payer: Self-pay | Admitting: Internal Medicine

## 2019-12-08 DIAGNOSIS — Z1231 Encounter for screening mammogram for malignant neoplasm of breast: Secondary | ICD-10-CM

## 2019-12-20 DIAGNOSIS — R399 Unspecified symptoms and signs involving the genitourinary system: Secondary | ICD-10-CM | POA: Diagnosis not present

## 2019-12-26 ENCOUNTER — Ambulatory Visit
Admission: RE | Admit: 2019-12-26 | Discharge: 2019-12-26 | Disposition: A | Discharge status: Home / Self Care | Payer: Medicare HMO | Source: Ambulatory Visit | Attending: Internal Medicine | Admitting: Internal Medicine

## 2019-12-26 ENCOUNTER — Other Ambulatory Visit: Payer: Self-pay

## 2019-12-26 ENCOUNTER — Ambulatory Visit: Payer: Medicare HMO

## 2019-12-26 DIAGNOSIS — Z1231 Encounter for screening mammogram for malignant neoplasm of breast: Secondary | ICD-10-CM | POA: Insufficient documentation

## 2019-12-26 DIAGNOSIS — Z0100 Encounter for examination of eyes and vision without abnormal findings: Secondary | ICD-10-CM | POA: Diagnosis not present

## 2020-01-30 DIAGNOSIS — M9905 Segmental and somatic dysfunction of pelvic region: Secondary | ICD-10-CM | POA: Diagnosis not present

## 2020-01-30 DIAGNOSIS — M9903 Segmental and somatic dysfunction of lumbar region: Secondary | ICD-10-CM | POA: Diagnosis not present

## 2020-01-30 DIAGNOSIS — M5136 Other intervertebral disc degeneration, lumbar region: Secondary | ICD-10-CM | POA: Diagnosis not present

## 2020-01-30 DIAGNOSIS — M5416 Radiculopathy, lumbar region: Secondary | ICD-10-CM | POA: Diagnosis not present

## 2020-02-01 DIAGNOSIS — I10 Essential (primary) hypertension: Secondary | ICD-10-CM | POA: Diagnosis not present

## 2020-02-01 DIAGNOSIS — M9903 Segmental and somatic dysfunction of lumbar region: Secondary | ICD-10-CM | POA: Diagnosis not present

## 2020-02-01 DIAGNOSIS — F419 Anxiety disorder, unspecified: Secondary | ICD-10-CM | POA: Diagnosis not present

## 2020-02-01 DIAGNOSIS — I2510 Atherosclerotic heart disease of native coronary artery without angina pectoris: Secondary | ICD-10-CM | POA: Diagnosis not present

## 2020-02-01 DIAGNOSIS — E7849 Other hyperlipidemia: Secondary | ICD-10-CM | POA: Diagnosis not present

## 2020-02-01 DIAGNOSIS — M9905 Segmental and somatic dysfunction of pelvic region: Secondary | ICD-10-CM | POA: Diagnosis not present

## 2020-02-01 DIAGNOSIS — M5416 Radiculopathy, lumbar region: Secondary | ICD-10-CM | POA: Diagnosis not present

## 2020-02-01 DIAGNOSIS — K219 Gastro-esophageal reflux disease without esophagitis: Secondary | ICD-10-CM | POA: Diagnosis not present

## 2020-02-01 DIAGNOSIS — M1711 Unilateral primary osteoarthritis, right knee: Secondary | ICD-10-CM | POA: Diagnosis not present

## 2020-02-01 DIAGNOSIS — M059 Rheumatoid arthritis with rheumatoid factor, unspecified: Secondary | ICD-10-CM | POA: Diagnosis not present

## 2020-02-01 DIAGNOSIS — M5136 Other intervertebral disc degeneration, lumbar region: Secondary | ICD-10-CM | POA: Diagnosis not present

## 2020-02-01 DIAGNOSIS — R7309 Other abnormal glucose: Secondary | ICD-10-CM | POA: Diagnosis not present

## 2020-02-08 DIAGNOSIS — F418 Other specified anxiety disorders: Secondary | ICD-10-CM | POA: Diagnosis not present

## 2020-02-08 DIAGNOSIS — M1711 Unilateral primary osteoarthritis, right knee: Secondary | ICD-10-CM | POA: Diagnosis not present

## 2020-02-08 DIAGNOSIS — I2510 Atherosclerotic heart disease of native coronary artery without angina pectoris: Secondary | ICD-10-CM | POA: Diagnosis not present

## 2020-02-08 DIAGNOSIS — I10 Essential (primary) hypertension: Secondary | ICD-10-CM | POA: Diagnosis not present

## 2020-02-08 DIAGNOSIS — E119 Type 2 diabetes mellitus without complications: Secondary | ICD-10-CM | POA: Diagnosis not present

## 2020-02-08 DIAGNOSIS — Z23 Encounter for immunization: Secondary | ICD-10-CM | POA: Diagnosis not present

## 2020-02-08 DIAGNOSIS — Z79899 Other long term (current) drug therapy: Secondary | ICD-10-CM | POA: Diagnosis not present

## 2020-02-08 DIAGNOSIS — M069 Rheumatoid arthritis, unspecified: Secondary | ICD-10-CM | POA: Diagnosis not present

## 2020-02-08 DIAGNOSIS — Z0000 Encounter for general adult medical examination without abnormal findings: Secondary | ICD-10-CM | POA: Diagnosis not present

## 2020-02-09 NOTE — Progress Notes (Signed)
02/10/2020 10:05 AM   Elaine Cox [birth year] [id number]  CC: Chief Complaint  Patient presents with  . Hematuria    HPI: Elaine Cox is a 74 y.o. female with PMH microscopic hematuria, recurrent UTI, and recurrent nephrolithiasis who presents today for follow up concerning her 3 known nonobstructing right renal stones.  CT stone study dated 07/30/2019 revealed 3 nonobstructing right renal stones measuring up to 5 mm.  KUB 09/29/2019 reveals stable right renal stones and multiple stable pelvic calcifications.  KUB 02/10/2020 3 punctate right renal stones and one 5 mm left renal stone.  Today, she states she is having frequency painful urination at night, lower abdominal pain and occasional genital pain.  This has been going on for one month.   The urine feels hot when it hits her vaginal wall.   Patient denies any modifying or aggravating factors.  Patient denies any gross hematuria, dysuria or suprapubic/flank pain.  Patient denies any fevers, chills, nausea or vomiting.   Cysto in May was NED.   Her UA from PCP's office on 02/01/2020 was unremarkable.    PMH: Past Medical History:  Diagnosis Date  . Anginal pain ([place]) 06/2016   being followed by dr. Ubaldo Glassing  . Anxiety   . Arthritis    Osteoarthritis  . Arthritis    Rheumatoid  . Breast cancer ([place]) 2000   Right Breast - Chemotherapy  . 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  . Coronary artery disease   . Diabetes mellitus without complication [facility])    Patient takes Metformin  . Dyspnea 03/2017  . GERD (gastroesophageal reflux disease)   . History of abdominal hysterectomy   . History of eyelid surgery   . History of kidney stones   . Hyperlipidemia   . Hypertension   . Lumbago   . Myocardial infarction ([place]) 07/2010  . Orthopnea 03/2017  . Personal history of chemotherapy 2000   right breast ca  . Sinus disorder   . Stroke [facility]) 2006, 2012  . Wears dentures    full upper  and lower    Surgical History: Past Surgical History:  Procedure Laterality Date  . ABDOMINAL HYSTERECTOMY    . BREAST SURGERY Right    mastectomy   . CARDIAC CATHETERIZATION  2012   stent placed  . CATARACT EXTRACTION W/PHACO Right 06/24/2017   Procedure: CATARACT EXTRACTION PHACO AND INTRAOCULAR LENS PLACEMENT ([place]) COMPLICATED RIGHT DIABETIC;  Surgeon: Leandrew Koyanagi, MD;  Location: [facility];  Service: Ophthalmology;  Laterality: Right;  Diabetes - oral med  . COLONOSCOPY    . COLONOSCOPY WITH PROPOFOL N/A 12/08/2017   Procedure: COLONOSCOPY WITH PROPOFOL;  Surgeon: Toledo, Benay Pike, MD;  Location: [facility] ENDOSCOPY;  Service: Gastroenterology;  Laterality: N/A;  . CORONARY ANGIOPLASTY    . CORONARY STENT INTERVENTION N/A 02/24/2018   Procedure: CORONARY STENT INTERVENTION;  Surgeon: [name], MD;  Location: [facility] CV LAB;  Service: Cardiovascular;  Laterality: N/A;  . CYSTOSCOPY N/A 09/08/2016   Procedure: CYSTOSCOPY;  Surgeon: Ward, Honor Loh, MD;  Location: [facility] ORS;  Service: Gynecology;  Laterality: N/A;  . EYE SURGERY Left    Cataract Extraction with IOL  . KNEE ARTHROSCOPY W/ MENISCAL REPAIR Right   . LAPAROSCOPIC BILATERAL SALPINGO OOPHERECTOMY Bilateral 09/08/2016   Procedure: LAPAROSCOPIC BILATERAL SALPINGO OOPHORECTOMY;  Surgeon: Ward, Honor Loh, MD;  Location: [facility] ORS;  Service: Gynecology;  Laterality: Bilateral;  . LEFT HEART CATH AND CORONARY ANGIOGRAPHY Left 02/24/2018   Procedure:  LEFT HEART CATH AND CORONARY ANGIOGRAPHY;  Surgeon: [name], MD;  Location: [facility] CV LAB;  Service: Cardiovascular;  Laterality: Left;  Marland Kitchen MASTECTOMY Right 2000   BREAST CA    Home Medications:  Allergies as of 02/10/2020      Reactions   Doxazosin Other (See Comments)   "headache" Other reaction(s): Headache   Fish Allergy Anaphylaxis, Itching, Nausea Only, Swelling   She states her tongue turns black. She states her tongue turns  black.   Dicloxacillin Nausea And Vomiting   Has patient had a PCN reaction causing immediate rash, facial/tongue/throat swelling, SOB or lightheadedness with hypotension: No Has patient had a PCN reaction causing severe rash involving mucus membranes or skin necrosis: No Has patient had a PCN reaction that required hospitalization: Unknown Has patient had a PCN reaction occurring within the last 10 years: Unknown If all of the above answers are "NO", then may proceed with Cephalosporin use. Other reaction(s): Vomiting   Iodine Hives   Per patient, she had a previous reaction/hives from an iodine injection Per patient, she had a previous reaction/hives from an iodine injection   Sulfa Antibiotics Swelling   Loss of appetite   Constipation bloating Other reaction(s): Headache Loss of appetit   Constipation bloating Other reaction(s): Headache Loss of appetit   Constipation bloating   Beta Adrenergic Blockers Other (See Comments)   "Unknown"   Cardura [doxazosin Mesylate]    Headache    Etodolac Itching, Swelling   Sulfamethoxazole-trimethoprim Nausea Only   Azithromycin Itching   Ciprofloxacin Nausea Only, Rash   Clopidogrel Nausea And Vomiting   Colchicine Nausea Only, Other (See Comments)   Dizzness   Glucosamine Nausea And Vomiting, Other (See Comments)   Leflunomide Diarrhea, Itching   Meloxicam Itching   Methotrexate Nausea Only   Zoloft [sertraline] Nausea And Vomiting      Medication List       Accurate as of February 10, 2020 10:05 AM. If you have any questions, ask your nurse or doctor.        Accu-Chek Guide test strip Generic drug: glucose blood   acetaminophen 325 MG tablet Commonly known as: TYLENOL Take 2 tablets (650 mg total) by mouth every 6 (six) hours as needed for mild pain (or Fever >/= 101).   ALPRAZolam 0.25 MG tablet Commonly known as: XANAX Take 0.25 mg by mouth at bedtime.   amLODipine 2.5 MG tablet Commonly known as: NORVASC Take by  mouth. What changed: Another medication with the same name was removed. Continue taking this medication, and follow the directions you see here. Changed by: Zara Council, PA-C   aspirin EC 81 MG tablet Take 81 mg by mouth daily.   atorvastatin 80 MG tablet Commonly known as: LIPITOR Take 1 tablet (80 mg total) by mouth daily at 6 PM.   B-D SINGLE USE SWABS REGULAR Pads   Biotin 800 MCG Tabs Take 1 tablet by mouth daily.   bisacodyl 5 MG EC tablet Commonly known as: DULCOLAX Take 5 mg by mouth daily as needed for moderate constipation.   clopidogrel 75 MG tablet Commonly known as: PLAVIX Take 1 tablet (75 mg total) by mouth daily.   diphenhydrAMINE 25 MG tablet Commonly known as: BENADRYL Take 25 mg by mouth daily as needed for allergies.   GlucoCom Blood Glucose Monitor Devi by XX route once daily   hydrOXYzine 10 MG tablet Commonly known as: ATARAX/VISTARIL Take 10 mg by mouth daily.   ibuprofen 200 MG tablet Commonly  known as: ADVIL Take 600 mg by mouth 2 (two) times daily as needed for moderate pain.   metFORMIN 500 MG tablet Commonly known as: GLUCOPHAGE Take 500 mg by mouth 2 (two) times daily with a meal.   metoprolol succinate 100 MG 24 hr tablet Commonly known as: TOPROL-XL TAKE 1 TABLET EVERY DAY   mupirocin ointment 2 % Commonly known as: BACTROBAN Apply 1 application topically 3 (three) times daily.   MURINE TEARS FOR DRY EYES OP Apply 1 drop to eye daily as needed (dry eyes).   Muscle Rub 10-15 % Crea Apply 1 application topically as needed for muscle pain.   nitroGLYCERIN 0.4 MG SL tablet Commonly known as: NITROSTAT Place 0.4 mg under the tongue every 5 (five) minutes as needed for chest pain.   omeprazole 20 MG capsule Commonly known as: PRILOSEC Take 20 mg by mouth daily.   Potassium 75 MG Tabs TAKE 2 TABLETS (40 MEQ TOTAL) BY MOUTH 3 (THREE) TIMES DAILY FOR 10 DAYS   potassium chloride 10 MEQ tablet Commonly known as:  KLOR-CON Take 10 mEq by mouth 2 (two) times daily.   potassium chloride 10 MEQ tablet Commonly known as: KLOR-CON   rosuvastatin 5 MG tablet Commonly known as: CRESTOR Take by mouth.   simvastatin 20 MG tablet Commonly known as: ZOCOR Take by mouth.   True Metrix Level 1 Low Soln   Xeljanz 5 MG Tabs Generic drug: Tofacitinib Citrate Take 5 mg by mouth 2 (two) times daily.       Allergies:  Allergies  Allergen Reactions  . Doxazosin Other (See Comments)    "headache" Other reaction(s): Headache  . Fish Allergy Anaphylaxis, Itching, Nausea Only and Swelling    She states her tongue turns black. She states her tongue turns black.  . Dicloxacillin Nausea And Vomiting    Has patient had a PCN reaction causing immediate rash, facial/tongue/throat swelling, SOB or lightheadedness with hypotension: No Has patient had a PCN reaction causing severe rash involving mucus membranes or skin necrosis: No Has patient had a PCN reaction that required hospitalization: Unknown Has patient had a PCN reaction occurring within the last 10 years: Unknown If all of the above answers are "NO", then may proceed with Cephalosporin use.  Other reaction(s): Vomiting  . Iodine Hives    Per patient, she had a previous reaction/hives from an iodine injection Per patient, she had a previous reaction/hives from an iodine injection  . Sulfa Antibiotics Swelling    Loss of appetite   Constipation bloating Other reaction(s): Headache Loss of appetit   Constipation bloating Other reaction(s): Headache Loss of appetit   Constipation bloating  . Beta Adrenergic Blockers Other (See Comments)    "Unknown"  . Cardura [Doxazosin Mesylate]     Headache   . Etodolac Itching and Swelling  . Sulfamethoxazole-Trimethoprim Nausea Only  . Azithromycin Itching  . Ciprofloxacin Nausea Only and Rash  . Clopidogrel Nausea And Vomiting  . Colchicine Nausea Only and Other (See Comments)    Dizzness  . Glucosamine  Nausea And Vomiting and Other (See Comments)  . Leflunomide Diarrhea and Itching  . Meloxicam Itching  . Methotrexate Nausea Only  . Zoloft [Sertraline] Nausea And Vomiting    Family History: Family History  Problem Relation Age of Onset  . Diabetes type II Mother   . Hypertension Mother   . Colon polyps Mother   . Breast cancer Maternal Aunt     Social History:   reports that she has  never smoked. She has never used smokeless tobacco. She reports that she does not drink alcohol and does not use drugs.  Physical Exam: BP 131/73   Pulse 63   Ht '5\' 2"'  (1.575 m)   Wt 184 lb (83.5 kg)   BMI 33.65 kg/m   Constitutional:  Well nourished. Alert and oriented, No acute distress. HEENT: [place] AT, mask in place.  Trachea midline Cardiovascular: No clubbing, cyanosis, or edema. Respiratory: Normal respiratory effort, no increased work of breathing. Neurologic: Grossly intact, no focal deficits, moving all 4 extremities. Psychiatric: Normal mood and affect.   Laboratory Data: Specimen:  Urine  Ref Range & Units 8 d ago  Color Yellow  Yellow      Clarity Clear  Clear      Specific Gravity 1.000 - 1.030  1.010      pH, Urine 5.0 - 8.0  6.5      Protein, Urinalysis Negative, Trace mg/dL Negative      Glucose, Urinalysis Negative mg/dL Negative      Ketones, Urinalysis Negative mg/dL Negative      Blood, Urinalysis Negative  SmallAbnormal      Nitrite, Urinalysis Negative  Negative      Leukocyte Esterase, Urinalysis Negative  Negative      White Blood Cells, Urinalysis None Seen, 0-3 /hpf None Seen      Red Blood Cells, Urinalysis None Seen, 0-3 /hpf 0-3      Bacteria, Urinalysis None Seen /hpf RareAbnormal      Squamous Epithelial Cells, Urinalysis Rare, Few, None Seen /hpf ModerateAbnormal      Resulting Agency  [facility] - LAB  Specimen Collected: 02/01/20 9:07 AM Last Resulted: 02/01/20 9:44 AM  Received From: [facility]  Result  Received: 02/09/20 12:57 PM   Specimen:  Blood  Ref Range & Units 8 d ago  Glucose 70 - 110 mg/dL 143High      Sodium 136 - 145 mmol/L 140      Potassium 3.6 - 5.1 mmol/L 3.8      Chloride 97 - 109 mmol/L 103      Carbon Dioxide (CO2) 22.0 - 32.0 mmol/L 31.2      Urea Nitrogen (BUN) 7 - 25 mg/dL 8      Creatinine 0.6 - 1.1 mg/dL 0.7      Glomerular Filtration Rate (eGFR), MDRD Estimate >60 mL/min/1.73sq m 82      Calcium 8.7 - 10.3 mg/dL 9.6      AST  8 - 39 U/L 17      ALT  5 - 38 U/L 7      Alk Phos (alkaline Phosphatase) 34 - 104 U/L 63      Albumin 3.5 - 4.8 g/dL 4.0      Bilirubin, Total 0.3 - 1.2 mg/dL 0.8      Protein, Total 6.1 - 7.9 g/dL 7.2      A/G Ratio 1.0 - 5.0 gm/dL 1.3      Resulting Agency  [facility] - LAB  Specimen Collected: 02/01/20 9:06 AM Last Resulted: 02/01/20 11:14 AM  Received From: [facility]  Result Received: 02/09/20 12:57 PM   Specimen:  Blood  Ref Range & Units 8 d ago  WBC (White Blood Cell Count) 4.1 - 10.2 10^3/uL 3.8Low      RBC (Red Blood Cell Count) 4.04 - 5.48 10^6/uL 4.37      Hemoglobin 12.0 - 15.0 gm/dL 12.7  Hematocrit 35.0 - 47.0 % 36.5      MCV (Mean Corpuscular Volume) 80.0 - 100.0 fl 83.5      MCH (Mean Corpuscular Hemoglobin) 27.0 - 31.2 pg 29.1      MCHC (Mean Corpuscular Hemoglobin Concentration) 32.0 - 36.0 gm/dL 34.8      Platelet Count 150 - 450 10^3/uL 286      RDW-CV (Red Cell Distribution Width) 11.6 - 14.8 % 14.8      MPV (Mean Platelet Volume) 9.4 - 12.4 fl 10.1      Neutrophils 1.50 - 7.80 10^3/uL 2.49      Lymphocytes 1.00 - 3.60 10^3/uL 0.80Low      Monocytes 0.00 - 1.50 10^3/uL 0.42      Eosinophils 0.00 - 0.55 10^3/uL 0.09      Basophils 0.00 - 0.09 10^3/uL 0.01      Neutrophil % 32.0 - 70.0 % 65.3      Lymphocyte % 10.0 - 50.0 % 21.0      Monocyte % 4.0 - 13.0 % 11.0      Eosinophil % 1.0 - 5.0 % 2.4      Basophil% 0.0 - 2.0 % 0.3       Immature Granulocyte % <=0.7 % 0.0      Immature Granulocyte Count <=0.06 10^3/L 0.00      Resulting Agency  [facility] - LAB  Specimen Collected: 02/01/20 8:57 AM Last Resulted: 02/01/20 10:11 AM  Received From: [facility]  Result Received: 02/09/20 12:57 PM   Specimen:  Blood  Ref Range & Units 8 d ago  Hemoglobin A1C 4.2 - 5.6 % 6.2High      Average Blood Glucose (Calc) mg/dL [facility] - LAB  Narrative Performed by [facility] - LAB Normal Range:  4.2 - 5.6%  Increased Risk: 5.7 - 6.4%  Diabetes:    >= 6.5%  Glycemic Control for adults with diabetes: <7%  Specimen Collected: 02/01/20 8:57 AM Last Resulted: 02/01/20 11:27 AM  Received From: [facility]  Result Received: 02/09/20 12:57 PM  I have reviewed the labs.  Pertinent Imaging: CLINICAL DATA:  74 year old female with kidney stones.  EXAM: ABDOMEN - 1 VIEW  COMPARISON:  Radiograph dated 09/29/2019 CT abdomen pelvis dated 07/29/2019.  FINDINGS: Several punctate radiopaque foci noted over the right renal silhouette. There is a faint 4 mm focus over the left renal silhouette, possibly artifactual. No obvious stone noted along the course of the ureters. Multiple punctate phleboliths noted over the pelvis.  There is moderate stool throughout the colon. No bowel dilatation or evidence of obstruction. No free air. Degenerative changes of the lower lumbar spine. No acute osseous pathology.  IMPRESSION: Punctate right renal calculi.   Electronically Signed   By: Anner Crete M.D.   On: 02/12/2020 12:03 I have independently reviewed the films.  See HPI.   Assessment & Plan:    1. Bilateral nephrolithiasis Today's KUB demonstrates stability of her bilateral nephrolithiasis She will return in 1 year for KUB  2. Vaginal atrophy She is reluctant to start any vaginal estrogen cream due to her  breast cancer history Advised her to use over-the-counter vaginal lubricants are all well for her discomfort  3. High risk hematuria Hematuria work-up completed in May 2021-bilateral nephrolithiasis She does not endorse any gross hematuria UA from Dr. Linton Ham office in October 2021 was negative for microscopic hematuria She will  return in 1 year for recheck on UA and report any episodes of gross hematuria in the interim  Return in about 1 year (around 02/09/2021) for KUB, UA and office visit .  Zara Council, PA-C  [facility] [address], [place], [place] [address] [phone]

## 2020-02-10 ENCOUNTER — Other Ambulatory Visit: Payer: Self-pay

## 2020-02-10 ENCOUNTER — Ambulatory Visit: Payer: Medicare HMO | Admitting: Urology

## 2020-02-10 ENCOUNTER — Encounter: Payer: Self-pay | Admitting: Urology

## 2020-02-10 ENCOUNTER — Ambulatory Visit
Admission: RE | Admit: 2020-02-10 | Discharge: 2020-02-10 | Disposition: A | Discharge status: Home / Self Care | Payer: Medicare HMO | Attending: Urology | Admitting: Urology

## 2020-02-10 ENCOUNTER — Ambulatory Visit
Admission: RE | Admit: 2020-02-10 | Discharge: 2020-02-10 | Disposition: A | Discharge status: Home / Self Care | Payer: Medicare HMO | Source: Ambulatory Visit | Attending: Urology | Admitting: Urology

## 2020-02-10 VITALS — BP 131/73 | HR 63 | Ht 62.0 in | Wt 184.0 lb

## 2020-02-10 DIAGNOSIS — N952 Postmenopausal atrophic vaginitis: Secondary | ICD-10-CM

## 2020-02-10 DIAGNOSIS — N200 Calculus of kidney: Secondary | ICD-10-CM

## 2020-02-10 DIAGNOSIS — M47816 Spondylosis without myelopathy or radiculopathy, lumbar region: Secondary | ICD-10-CM | POA: Diagnosis not present

## 2020-02-10 DIAGNOSIS — I878 Other specified disorders of veins: Secondary | ICD-10-CM | POA: Diagnosis not present

## 2020-02-10 DIAGNOSIS — R319 Hematuria, unspecified: Secondary | ICD-10-CM | POA: Diagnosis not present

## 2020-02-10 NOTE — Patient Instructions (Signed)
You can apply over the counter creams/gels for vaginal dryness and/or olive oil for vaginal discomfort

## 2020-02-15 DIAGNOSIS — M5136 Other intervertebral disc degeneration, lumbar region: Secondary | ICD-10-CM | POA: Diagnosis not present

## 2020-02-15 DIAGNOSIS — M9905 Segmental and somatic dysfunction of pelvic region: Secondary | ICD-10-CM | POA: Diagnosis not present

## 2020-02-15 DIAGNOSIS — M9903 Segmental and somatic dysfunction of lumbar region: Secondary | ICD-10-CM | POA: Diagnosis not present

## 2020-02-15 DIAGNOSIS — M5416 Radiculopathy, lumbar region: Secondary | ICD-10-CM | POA: Diagnosis not present

## 2020-02-20 DIAGNOSIS — M8588 Other specified disorders of bone density and structure, other site: Secondary | ICD-10-CM | POA: Diagnosis not present

## 2020-03-03 NOTE — Discharge Instructions (Signed)
Instructions after Total Knee Replacement   Elaine Cox Elaine Cox, Jr., M.D.     Dept. of Orthopaedics & Sports Medicine  [facility]  [address]  [place], [place]  [address]  Phone: [phone]   Fax: [phone]    DIET: Drink plenty of non-alcoholic fluids. Resume your normal diet. Include foods high in fiber.  ACTIVITY:  You may use crutches or a walker with weight-bearing as tolerated, unless instructed otherwise. You may be weaned off of the walker or crutches by your Physical Therapist.  Do NOT place pillows under the knee. Anything placed under the knee could limit your ability to straighten the knee.   Continue doing gentle exercises. Exercising will reduce the pain and swelling, increase motion, and prevent muscle weakness.   Please continue to use the TED compression stockings for 6 weeks. You may remove the stockings at night, but should reapply them in the morning. Do not drive or operate any equipment until instructed.  WOUND CARE:  Continue to use the PolarCare or ice packs periodically to reduce pain and swelling. You may bathe or shower after the staples are removed at the first office visit following surgery.  MEDICATIONS: You may resume your regular medications. Please take the pain medication as prescribed on the medication. Do not take pain medication on an empty stomach. You have been given a prescription for a blood thinner (Lovenox or Coumadin). Please take the medication as instructed. (NOTE: After completing a 2 week course of Lovenox, take one Enteric-coated aspirin once a day. This along with elevation will help reduce the possibility of phlebitis in your operated leg.) Do not drive or drink alcoholic beverages when taking pain medications.  CALL THE OFFICE FOR: Temperature above 101 degrees Excessive bleeding or drainage on the dressing. Excessive swelling, coldness, or paleness of the toes. Persistent nausea and vomiting.  FOLLOW-UP:  You  should have an appointment to return to the office in 10-14 days after surgery. Arrangements have been made for continuation of Physical Therapy (either home therapy or outpatient therapy).   [facility] Department Directory         [email or web address]       [email or web address]          Cardiology  Appointments: [place] - [phone] [place] - [phone]  Endocrinology  Appointments: [place] - [phone] [place] - [phone]  Gastroenterology  Appointments: [place] - [phone] [place] - [phone]        General Surgery   Appointments: [place] - [phone]  Internal Medicine/Family Medicine  Appointments: [place] - [phone] [place] - [phone] [place] - [phone]  Metabolic and Weigh Loss Surgery  Appointments: [place] - [phone]        Neurology  Appointments: [place] - [phone] [place] - [phone]  Neurosurgery  Appointments: [place] - [phone]  Obstetrics & Gynecology  Appointments: [place] - [phone] [place] - [phone]        Pediatrics  Appointments: [place] - [phone] [place] - [phone]  Physiatry  Appointments: [place] -[phone]  Physical Therapy  Appointments: [place] - [phone] [place] - [phone]        Podiatry  Appointments: [place] - [phone] [place] - [phone]  Pulmonology  Appointments: [place] - [phone]  Rheumatology  Appointments: [place] - [phone]         Location: [facility]  [address] , [place]  [address]  [place] Location: [facility] [address] [place], [place]  [address]  [place] Location: [facility] [address] [place],   [address]    

## 2020-03-07 ENCOUNTER — Other Ambulatory Visit: Payer: Self-pay

## 2020-03-07 ENCOUNTER — Encounter
Admission: RE | Admit: 2020-03-07 | Discharge: 2020-03-07 | Disposition: A | Discharge status: Home / Self Care | Payer: Medicare HMO | Source: Ambulatory Visit | Attending: Orthopedic Surgery | Admitting: Orthopedic Surgery

## 2020-03-07 DIAGNOSIS — Z01810 Encounter for preprocedural cardiovascular examination: Secondary | ICD-10-CM | POA: Diagnosis not present

## 2020-03-07 DIAGNOSIS — M1711 Unilateral primary osteoarthritis, right knee: Secondary | ICD-10-CM | POA: Diagnosis not present

## 2020-03-07 DIAGNOSIS — Z01818 Encounter for other preprocedural examination: Secondary | ICD-10-CM | POA: Insufficient documentation

## 2020-03-07 LAB — CBC
HCT: 36.8 % (ref 36.0–46.0)
Hemoglobin: 13.1 g/dL (ref 12.0–15.0)
MCH: 29.5 pg (ref 26.0–34.0)
MCHC: 35.6 g/dL (ref 30.0–36.0)
MCV: 82.9 fL (ref 80.0–100.0)
Platelets: 267 10*3/uL (ref 150–400)
RBC: 4.44 MIL/uL (ref 3.87–5.11)
RDW: 15.4 % (ref 11.5–15.5)
WBC: 3.8 10*3/uL — ABNORMAL LOW (ref 4.0–10.5)
nRBC: 0 % (ref 0.0–0.2)

## 2020-03-07 LAB — TYPE AND SCREEN
ABO/RH(D): B POS
Antibody Screen: NEGATIVE

## 2020-03-07 LAB — URINALYSIS, ROUTINE W REFLEX MICROSCOPIC
Bilirubin Urine: NEGATIVE
Glucose, UA: NEGATIVE mg/dL
Hgb urine dipstick: NEGATIVE
Ketones, ur: NEGATIVE mg/dL
Leukocytes,Ua: NEGATIVE
Nitrite: NEGATIVE
Protein, ur: 30 mg/dL — AB
Specific Gravity, Urine: 1.028 (ref 1.005–1.030)
pH: 5 (ref 5.0–8.0)

## 2020-03-07 LAB — SURGICAL PCR SCREEN
MRSA, PCR: NEGATIVE
Staphylococcus aureus: NEGATIVE

## 2020-03-07 LAB — COMPREHENSIVE METABOLIC PANEL
ALT: 13 U/L (ref 0–44)
AST: 24 U/L (ref 15–41)
Albumin: 3.9 g/dL (ref 3.5–5.0)
Alkaline Phosphatase: 57 U/L (ref 38–126)
Anion gap: 10 (ref 5–15)
BUN: 14 mg/dL (ref 8–23)
CO2: 28 mmol/L (ref 22–32)
Calcium: 9.7 mg/dL (ref 8.9–10.3)
Chloride: 101 mmol/L (ref 98–111)
Creatinine, Ser: 0.77 mg/dL (ref 0.44–1.00)
GFR, Estimated: >60 mL/min (ref >60)
Glucose, Bld: 103 mg/dL — ABNORMAL HIGH (ref 70–99)
Potassium: 3.7 mmol/L (ref 3.5–5.1)
Sodium: 139 mmol/L (ref 135–145)
Total Bilirubin: 1.1 mg/dL (ref 0.3–1.2)
Total Protein: 8.2 g/dL — ABNORMAL HIGH (ref 6.5–8.1)

## 2020-03-07 LAB — HEMOGLOBIN A1C
Hgb A1c MFr Bld: 6.6 % — ABNORMAL HIGH (ref 4.8–5.6)
Mean Plasma Glucose: 142.72 mg/dL

## 2020-03-07 LAB — C-REACTIVE PROTEIN: CRP: 0.6 mg/dL (ref <1.0)

## 2020-03-07 LAB — PROTIME-INR
INR: 1 (ref 0.8–1.2)
Prothrombin Time: 12.9 seconds (ref 11.4–15.2)

## 2020-03-07 LAB — APTT: aPTT: 33 seconds (ref 24–36)

## 2020-03-07 LAB — SEDIMENTATION RATE: Sed Rate: 28 mm/hr (ref 0–30)

## 2020-03-07 NOTE — Patient Instructions (Addendum)
Your procedure is scheduled on: 03/14/20 - Wednesday Report to the Registration Desk on the 1st floor of the [facility]. To find out your arrival time, please call [phone] between 1PM - 3PM on: 03/13/20- Tuesday  REMEMBER: Instructions that are not followed completely may result in serious medical risk, up to and including death; or upon the discretion of your surgeon and anesthesiologist your surgery may need to be rescheduled.  Do not eat food after midnight the night before surgery.  No gum chewing, lozengers or hard candies.  You may however, drink CLEAR liquids up to 2 hours before you are scheduled to arrive for your surgery. Do not drink anything within 2 hours of your scheduled arrival time.  Type 1 and Type 2 diabetics should only drink water.  In addition, your doctor has ordered for you to drink the provided - Gatorade G2 Drinking this carbohydrate drink up to two hours before surgery helps to reduce insulin resistance and improve patient outcomes. Please complete drinking 2 hours prior to scheduled arrival time.  TAKE THESE MEDICATIONS THE MORNING OF SURGERY WITH A SIP OF WATER: - metoprolol succinate (TOPROL-XL) 100 MG 24 hr tablet - omeprazole (PRILOSEC) 20 MG capsule, (take one the night before and one on the morning of surgery - helps to prevent nausea after surgery.) - amLODipine (NORVASC) 2.5 MG tablet - Tofacitinib Citrate (XELJANZ) 5 MG TABS - hydrOXYzine (ATARAX/VISTARIL) 10 MG tablet   Stop Metformin  2 days prior to surgery. Do not take on 12/6, 12/07, and do not take the morning of surgery.  Follow recommendations from Cardiologist, Pulmonologist or PCP regarding stopping Aspirin stop taking today 03/07/20, Coumadin, Plavix, Eliquis, Pradaxa, or Pletal. Per patient , stopped taking Plavix on 02-29-20.  One week prior to surgery: Stop taking 03/07/20. Stop Anti-inflammatories (NSAIDS) such as Advil, Aleve, Ibuprofen, Motrin, Naproxen, Naprosyn and Aspirin  based products such as Excedrin, Goodys Powder, BC Powder.  Stop ANY OVER THE COUNTER supplements until after surgery.APPLE CIDER VINEGAR PO- Stop taking 03/07/20. (However, you may continue taking Vitamin D, Vitamin B, and multivitamin up until the day before surgery.)  No Alcohol for 24 hours before or after surgery.  No Smoking including e-cigarettes for 24 hours prior to surgery.  No chewable tobacco products for at least 6 hours prior to surgery.  No nicotine patches on the day of surgery.  Do not use any "recreational" drugs for at least a week prior to your surgery.  Please be advised that the combination of cocaine and anesthesia may have negative outcomes, up to and including death. If you test positive for cocaine, your surgery will be cancelled.  On the morning of surgery brush your teeth with toothpaste and water, you may rinse your mouth with mouthwash if you wish. Do not swallow any toothpaste or mouthwash.  Do not wear jewelry, make-up, hairpins, clips or nail polish.  Do not wear lotions, powders, or perfumes.   Do not shave body from the neck down 48 hours prior to surgery just in case you cut yourself which could leave a site for infection.  Also, freshly shaved skin may become irritated if using the CHG soap.  Contact lenses, hearing aids and dentures may not be worn into surgery.  Do not bring valuables to the hospital. [facility] is not responsible for any missing/lost belongings or valuables.   Use CHG Soap or wipes as directed on instruction sheet.  Notify your doctor if there is any change in your medical  condition (cold, fever, infection).  Wear comfortable clothing (specific to your surgery type) to the hospital.  Plan for stool softeners for home use; pain medications have a tendency to cause constipation. You can also help prevent constipation by eating foods high in fiber such as fruits and vegetables and drinking plenty of fluids as your diet  allows.  After surgery, you can help prevent lung complications by doing breathing exercises.  Take deep breaths and cough every 1-2 hours. Your doctor may order a device called an Incentive Spirometer to help you take deep breaths. When coughing or sneezing, hold a pillow firmly against your incision with both hands. This is called "splinting." Doing this helps protect your incision. It also decreases belly discomfort.  If you are being admitted to the hospital overnight, leave your suitcase in the car. After surgery it may be brought to your room.  If you are being discharged the day of surgery, you will not be allowed to drive home. You will need a responsible adult (18 years or older) to drive you home and stay with you that night.   If you are taking public transportation, you will need to have a responsible adult (18 years or older) with you. Please confirm with your physician that it is acceptable to use public transportation.   Please call the [facility]. at [phone] if you have any questions about these instructions.  Visitation Policy:  Patients undergoing a surgery or procedure may have one family member or support person with them as long as that person is not COVID-19 positive or experiencing its symptoms.  That person may remain in the waiting area during the procedure.  Inpatient Visitation Update:   In an effort to ensure the safety of our team members and our patients, we are implementing a change to our visitation policy:  Effective Monday, Aug. 9, at 7 a.m., inpatients will be allowed one support person.  o The support person may change daily.  o The support person must pass our screening, gel in and out, and wear a mask at all times, including in the patient's room.  o Patients must also wear a mask when staff or their support person are in the room.  o Masking is required regardless of vaccination status.  Systemwide, no visitors 17 or  younger.

## 2020-03-09 ENCOUNTER — Encounter: Payer: Self-pay | Admitting: Orthopedic Surgery

## 2020-03-09 LAB — URINE CULTURE
Culture: <10000 — AB
Special Requests: NORMAL

## 2020-03-09 LAB — IGE: IgE (Immunoglobulin E), Serum: 148 IU/mL (ref 6–495)

## 2020-03-09 NOTE — Progress Notes (Signed)
[facility] Perioperative Services  Pre-Admission/Anesthesia Testing Clinical Review  Date: 03/13/20  Patient Demographics:  Name: Elaine Cox DOB:   July 01, 1945 MRN:   [id number]  Planned Surgical Procedure(s):    Case: [id number] Date/Time: 03/14/20 1130   Procedure: COMPUTER ASSISTED TOTAL KNEE ARTHROPLASTY (Right Knee) - FOLLOWING 1ST CASE   Anesthesia type: Choice   Pre-op diagnosis: Primary osteoarthritis of right knee M17.11   Location: [facility] OR ROOM 01 / [place] [facility]S FOR ANESTHESIA GROUP   Surgeons: Dereck Leep, MD    NOTE: Available PAT nursing documentation and vital signs have been reviewed. Clinical nursing staff has updated patient's PMH/PSHx, current medication list, and drug allergies/intolerances to ensure comprehensive history available to assist in medical decision making as it pertains to the aforementioned surgical procedure and anticipated anesthetic course.   Clinical Discussion:  Elaine Cox is a 74 y.o. female who is submitted for pre-surgical anesthesia review and clearance prior to her undergoing the above procedure. Patient has never been a smoker. Pertinent PMH includes: CAD, angina, MI (8309), systolic murmur, CVA (4076, 2012), HTN, HLD, T2DM, dyspnea, orthopnea, GERD (on daily PPI), OA, RA, breast cancer, lumbago, anxiety (on BZO).  Patient is followed by cardiology Ubaldo Glassing, MD). She was last seen in the cardiology clinic on 11/03/2019; notes reviewed.  At the time of her clinic visit, patient was doing "fairly well" from a cardiovascular standpoint.  Activity level had decreased due to knee pain.  Patient underwent cardiac catheterization 02/2018 that revealed two-vessel CAD; 75% stenosis to mid LAD 1, 95% stenosis to mid LAD to, 75% stenosis to the distal RCA, and 60% stenosis to the proximal RCA to mid RCA.  DES x 2 were placed. Follow-up TTE on 03/08/2018 revealed normal left ventricular systolic function with LVEF of 55-60%.   Hypertension moderately controlled at 148/90 on prescribed CCB and beta-blocker therapy.  Patient is on a statin for her HLD. She has continued on  DAPT therapy (ASA + clopidogrel) following her stent placement.  ECG in the office revealed normal sinus rhythm with nonspecific ST and T wave changes.  Patient made mention of plans for elective orthopedic procedure later on in the year.  Cardiology recommending myocardial perfusion imaging study prior to proceeding.  No changes were made to patient's medication regimen.  Patient to follow-up with outpatient cardiology in 6 months.  Patient scheduled to undergo an elective right total knee arthroplasty on 03/14/2020 with Dr. Skip Estimable.  Given patient's past medical history significant for cardiovascular disease and intervention, presurgical cardiac clearance was sought by the performing surgeon's office.  Clearance was previously discussed with cardiology, however further testing was required.  Since patient was last seen by cardiology she has undergone a myocardial perfusion imaging study (12/05/2019) that revealed no evidence of stress-induced myocardial ischemia or arrhythmia; LVEF 64% (see full interpretation of cardiovascular testing below).  Per cardiology, "this patient is optimized for surgery and may proceed with the planned procedural course with a MODERATE risk stratification". Again, this patient is on daily DAPT  therapy. She has been instructed on recommendations for holding her prescribed antiplatelets medications prior to surgery. Patient advised PAT RN that she decided to stop her clopidogrel on 02/29/2020 in preparation for surgery, which will make for a total of a 14 day wash out on the day of her procedure. Patient encouraged to reach out to Dr. Clydell Hakim office to clarify as the normal hold period is generally 5-7 days. RN instructed patient to stop her ASA  1 week prior to surgery; last dose 03/06/2020.  She denies previous perioperative  complications with anesthesia. She underwent a general anesthetic course here (ASA III) in 12/2017 with no documented complications.   Vitals with BMI 03/07/2020 02/10/2020 09/29/2019  Height 5\' 2"  5\' 2"  -  Weight 191 lbs 2 oz 184 lbs -  BMI 09.32 67.12 -  Systolic 458 099 833  Diastolic 64 73 78  Pulse 64 63 69    Providers/Specialists:   NOTE: Primary physician provider listed below. Patient may have been seen by APP or partner within same practice.   PROVIDER ROLE LAST OV  Hooten, Laurice Record, MD Orthopedics (Surgeon)  03/07/2020  Tracie Harrier, MD Primary Care Provider  09/28/2019  Bartholome Bill, MD Cardiology  11/03/2019   Allergies:  Doxazosin, Fish allergy, Dicloxacillin, Iodine, Sulfa antibiotics, Beta adrenergic blockers, Cardura [doxazosin mesylate], Etodolac, Sulfamethoxazole-trimethoprim, Azithromycin, Ciprofloxacin, Clopidogrel, Colchicine, Glucosamine, Leflunomide, Meloxicam, Methotrexate, and Zoloft [sertraline]  Current Home Medications:   No current facility-administered medications for this encounter.   Marland Kitchen acetaminophen (TYLENOL) 325 MG tablet  . ALPRAZolam (XANAX) 0.25 MG tablet  . amLODipine (NORVASC) 2.5 MG tablet  . APPLE CIDER VINEGAR PO  . aspirin EC 81 MG tablet  . Biotin 800 MCG TABS  . bisacodyl (DULCOLAX) 5 MG EC tablet  . hydrOXYzine (ATARAX/VISTARIL) 10 MG tablet  . Menthol-Methyl Salicylate (MUSCLE RUB) 10-15 % CREA  . metFORMIN (GLUCOPHAGE) 500 MG tablet  . metoprolol succinate (TOPROL-XL) 100 MG 24 hr tablet  . nitroGLYCERIN (NITROSTAT) 0.4 MG SL tablet  . omeprazole (PRILOSEC) 20 MG capsule  . Polyvinyl Alcohol-Povidone (MURINE TEARS FOR DRY EYES OP)  . Potassium Chloride ER 20 MEQ TBCR  . rosuvastatin (CRESTOR) 5 MG tablet  . Tofacitinib Citrate (XELJANZ) 5 MG TABS  . ACCU-CHEK GUIDE test strip  . Alcohol Swabs (B-D SINGLE USE SWABS REGULAR) PADS  . Blood Glucose Calibration (TRUE METRIX LEVEL 1) Low SOLN  . Blood Glucose Monitoring Suppl  ([place]) DEVI  . clopidogrel (PLAVIX) 75 MG tablet  . diphenhydrAMINE (BENADRYL) 25 MG tablet  . ibuprofen (ADVIL,MOTRIN) 200 MG tablet  . mupirocin ointment (BACTROBAN) 2 %  . Potassium 75 MG TABS  . potassium chloride (K-DUR,KLOR-CON) 10 MEQ tablet  . simvastatin (ZOCOR) 20 MG tablet   History:   Past Medical History:  Diagnosis Date  . Anginal pain ([place]) 06/2016   being followed by dr. Ubaldo Glassing  . Anxiety   . Arthritis    Osteoarthritis  . Arthritis    Rheumatoid  . Breast cancer ([place]) 2000   Right Breast - Chemotherapy  . 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  . Coronary artery disease   . Diabetes mellitus without complication [facility])    Patient takes Metformin  . Dyspnea 03/2017  . GERD (gastroesophageal reflux disease)   . History of abdominal hysterectomy   . History of eyelid surgery   . History of kidney stones   . Hyperlipidemia   . Hypertension   . Lumbago   . Murmur   . Myocardial infarction ([place]) 07/2010  . Orthopnea 03/2017  . Personal history of chemotherapy 2000   right breast ca  . Sinus disorder   . Stroke [facility]) 2006, 2012  . Wears dentures    full upper and lower   Past Surgical History:  Procedure Laterality Date  . ABDOMINAL HYSTERECTOMY    . BREAST SURGERY Right    mastectomy   . CARDIAC CATHETERIZATION  2012  stent placed  . CATARACT EXTRACTION W/PHACO Right 06/24/2017   Procedure: CATARACT EXTRACTION PHACO AND INTRAOCULAR LENS PLACEMENT ([place]) COMPLICATED RIGHT DIABETIC;  Surgeon: Leandrew Koyanagi, MD;  Location: [place];  Service: Ophthalmology;  Laterality: Right;  Diabetes - oral med  . COLONOSCOPY    . COLONOSCOPY WITH PROPOFOL N/A 12/08/2017   Procedure: COLONOSCOPY WITH PROPOFOL;  Surgeon: Toledo, Benay Pike, MD;  Location: [facility] ENDOSCOPY;  Service: Gastroenterology;  Laterality: N/A;  . CORONARY ANGIOPLASTY    . CORONARY STENT INTERVENTION N/A 02/24/2018   Procedure: CORONARY  STENT INTERVENTION;  Surgeon: [name], MD;  Location: [place] CV LAB;  Service: Cardiovascular;  Laterality: N/A;  . CYSTOSCOPY N/A 09/08/2016   Procedure: CYSTOSCOPY;  Surgeon: Ward, Honor Loh, MD;  Location: [facility] ORS;  Service: Gynecology;  Laterality: N/A;  . EYE SURGERY Left    Cataract Extraction with IOL  . KNEE ARTHROSCOPY W/ MENISCAL REPAIR Right   . LAPAROSCOPIC BILATERAL SALPINGO OOPHERECTOMY Bilateral 09/08/2016   Procedure: LAPAROSCOPIC BILATERAL SALPINGO OOPHORECTOMY;  Surgeon: Ward, Honor Loh, MD;  Location: [facility] ORS;  Service: Gynecology;  Laterality: Bilateral;  . LEFT HEART CATH AND CORONARY ANGIOGRAPHY Left 02/24/2018   Procedure: LEFT HEART CATH AND CORONARY ANGIOGRAPHY;  Surgeon: [name], MD;  Location: [place] CV LAB;  Service: Cardiovascular;  Laterality: Left;  Marland Kitchen MASTECTOMY Right 2000   BREAST CA   Family History  Problem Relation Age of Onset  . Diabetes type II Mother   . Hypertension Mother   . Colon polyps Mother   . Breast cancer Maternal Aunt    Social History   Tobacco Use  . Smoking status: Never Smoker  . Smokeless tobacco: Never Used  Vaping Use  . Vaping Use: Never used  Substance Use Topics  . Alcohol use: No  . Drug use: No    Pertinent Clinical Results:  LABS: Labs reviewed: Acceptable for surgery.  Hospital Outpatient Visit on 03/12/2020  Component Date Value Ref Range Status  . SARS Coronavirus 2 03/12/2020 NEGATIVE  NEGATIVE Final   Comment: (NOTE) SARS-CoV-2 target nucleic acids are NOT DETECTED.  The SARS-CoV-2 RNA is generally detectable in upper and lower respiratory specimens during the acute phase of infection. Negative results do not preclude SARS-CoV-2 infection, do not rule out co-infections with other pathogens, and should not be used as the sole basis for treatment or other patient management decisions. Negative results must be combined with clinical observations, patient history, and  epidemiological information. The expected result is Negative.  Fact Sheet for Patients: SugarRoll.be  Fact Sheet for Healthcare Providers: https://www.woods-mathews.com/  This test is not yet approved or cleared by the [place] FDA and  has been authorized for detection and/or diagnosis of SARS-CoV-2 by FDA under an Emergency Use Authorization (EUA). This EUA will remain  in effect (meaning this test can be used) for the duration of the COVID-19 declaration under Se                          ction 564(b)(1) of the Act, 21 U.S.C. section 360bbb-3(b)(1), unless the authorization is terminated or revoked sooner.  Performed at [facility], [place] [address], [place], [place] [address]     ECG: Date: 03/07/2020 Time ECG obtained: 1434 PM Rate: 66 bpm Rhythm:  Normal sinus rhythm with sinus arrhythmia Axis (leads I and aVF): Normal Intervals: PR 176 ms. QRS 80 ms. QTc 492 ms. ST segment and T wave changes:  Nonspecific T wave abnormality  Comparison: Similar to previous tracing obtained on 03/07/2018   IMAGING / PROCEDURES: LEXISCAN done on 12/05/2019 1. LVEF 64% 2. Regional wall motion reveals normal myocardial thickening and wall motion 3. No evidence of stress-induced myocardial ischemia or arrhythmia 4. No artifacts noted 5. Left ventricular cavity size normal 6. The overall quality of the study is good  ECHOCARDIOGRAM done on 03/08/2018  Left ventricle: The cavity size was normal. Systolic function was normal. The estimated ejection fraction was in the range of 55% to 60%.  Aortic valve: There was trivial regurgitation.   Mitral valve: There was mild regurgitation.  LEFT HEART CATHETERIZATION AND CORONARY ANGIOGRAPHY done on 02/24/2018 1. Preserved left ventricular function with anteroapical hypokinesis; LVEF 2. Two-vessel coronary artery disease   Ost 2nd Diag to 2nd Diag lesion is 75% stenosed.  Mid LAD-1 lesion  is 75% stenosed.  Mid LAD-2 lesion is 95% stenosed.  Dist RCA lesion is 75% stenosed.  Prox RCA to Mid RCA lesion is 60% stenosed. 3. Successful PCI, with overlapping DES mid LAD  A drug-eluting stent was successfully placed using a STENT RESOLUTE ONYX 2.25X12.  Post intervention, there is a 0% residual stenosis.  A drug-eluting stent was successfully placed using a STENT RESOLUTE ONYX 2.5X15.  Post intervention, there is a 0% residual stenosis.  Post intervention, there is a 50% residual stenosis. 4. Residual 75% stenosis distal RCA treated medically       Impression and Plan:  Elaine Cox has been referred for pre-anesthesia review and clearance prior to her undergoing the planned anesthetic and procedural courses. Available labs, pertinent testing, and imaging results were personally reviewed by me. This patient has been appropriately cleared by cardiology.   Based on clinical review performed today (03/13/20), barring any significant acute changes in the patient's overall condition, it is anticipated that she will be able to proceed with the planned surgical intervention. Any acute changes in clinical condition may necessitate her procedure being postponed and/or cancelled. Pre-surgical instructions were reviewed with the patient during her PAT appointment and questions were fielded by PAT clinical staff.  Honor Loh, MSN, APRN, FNP-C, CEN [facility]  Peri-operative Services Nurse Practitioner Phone: [phone] 03/13/20 2:04 PM  NOTE: This note has been prepared using Dragon dictation software. Despite my best ability to proofread, there is always the potential that unintentional transcriptional errors may still occur from this process.

## 2020-03-11 NOTE — H&P (Signed)
ORTHOPAEDIC HISTORY & PHYSICAL Progress Notes Elaine Cox, [place] - 03/07/2020 3:00 PM EST  Chief Complaint: No chief complaint on file.  Reason for Visit: The patient is a 74 y.o. female who presents today for history and physical for right total knee arthroplasty with Dr. Marry Guan on 03/14/2020. Patient has severe tricompartmental osteoarthritis most severe in the medial compartment of the right knee. She complains of severe medial joint line knee pain with occasional swelling and giving away sensation. She denies any locking or catching. Her pain is aggravated with any type of weightbearing activity. She describes some aching and throbbing at nighttime with rest. She is unable to walk long distances due to her right knee pain. She is ambulating with a cane. She has tried activity modifications, narcotic medications, intra-articular cortisone injections with no relief. Right knee pain is interfering with quality of life and activities daily living.  Of note, the patient has seropositive rheumatoid arthritis. She is being treated with Morrie Sheldon. She is followed by Rheumatology  Medications: Current Outpatient Medications  Medication Sig Dispense Refill  . alcohol swabs PadM  . atorvastatin (LIPITOR) 80 MG tablet Take by mouth  . ACCU-CHEK SOFT DEV LANCETS kit Use 1 each 2 (two) times daily Use as instructed. 200 each 3  . ACCU-CHEK SOFTCLIX LANCETS lancets  . ALPRAZolam (XANAX) 0.25 MG tablet Take 1 tablet (0.25 mg total) by mouth once daily as needed for Sleep for up to 30 days 30 tablet 1  . amLODIPine (NORVASC) 2.5 MG tablet Take 1 tablet (2.5 mg total) by mouth once daily 90 tablet 3  . amLODIPine (NORVASC) 5 MG tablet Take 1 tablet (5 mg total) by mouth once daily 90 tablet 3  . APPLE CIDER VINEGAR ORAL Take 1 tablet by mouth once daily  . aspirin 81 MG EC tablet Take 81 mg by mouth once daily.  . biotin/calcium carbonate (BIOTIN 100+10 ORAL) Take 1 tablet by mouth once daily  .  bisacodyl (DULCOLAX) 5 mg EC tablet Take 1 tablet by mouth once daily  . blood glucose diagnostic (ACCU-CHEK GUIDE TEST STRIPS) test strip 1 each (1 strip total) by XX route 2 (two) times daily 200 each 3  . blood-glucose meter (ACCU-CHEK AVIVA PLUS METER) Misc by XX route as directed for 180 days 200 each 1  . metFORMIN (GLUCOPHAGE) 500 MG tablet Take 1 tablet (500 mg total) by mouth 2 (two) times daily with meals 180 tablet 3  . metoprolol succinate (TOPROL-XL) 100 MG XL tablet TAKE 1 TABLET EVERY DAY 90 tablet 1  . nitroGLYcerin (NITROSTAT) 0.4 MG SL tablet DISSOLVE 1 TABLET UNDER THE TONGUE EVERY 5 MINUTES AS NEEDED FOR CHEST PAIN. MAY TAKE UP TO 3 DOSES 25 tablet 1  . nystatin (MYCOSTATIN) 100,000 unit/gram powder Apply topically 3 (three) times daily 60 g 1  . omeprazole (PRILOSEC) 20 MG DR capsule Take 1 capsule (20 mg total) by mouth once daily 90 capsule 3  . phenazopyridine (PYRIDIUM) 200 MG tablet Take one tablet 3 times a day for 3 days. 9 tablet 0  . rosuvastatin (CRESTOR) 5 MG tablet Take 1 tablet (5 mg total) by mouth every Monday, Wednesday, and Friday for 180 days 36 tablet 1  . tofacitinib (XELJANZ) 5 mg immediate release tablet Take 1 tablet (5 mg total) by mouth 2 (two) times daily 180 tablet 1  . TRUE METRIX LEVEL 1 Soln Use 1 each as directed 1 each 2   No current facility-administered medications for this visit.  Allergies: Allergies  Allergen Reactions  . Fish Containing Products Anaphylaxis, Itching, Nausea and Swelling  She states her tongue turns black.  . Shellfish Containing Products Anaphylaxis  . Iodine Hives  Per patient, she had a previous reaction/hives from an iodine injection  . Beta-Blockers (Beta-Adrenergic Blocking Agts) Unknown  . Azithromycin Itching  . Bactrim [Sulfamethoxazole-Trimethoprim] Nausea  . Cardura [Doxazosin] Headache  . Ciprofloxacin Nausea and Rash  . Colchicine Nausea and Dizziness  . Dicloxacillin Vomiting  . Glucosamine  Unknown  . Leflunomide Diarrhea and Itching  . Lodine [Etodolac] Itching  . Methotrexate Nausea  . Mobic [Meloxicam] Itching  . Sulfa (Sulfonamide Antibiotics) Headache  Loss of appetit Constipation bloating   Past Medical History: Past Medical History:  Diagnosis Date  . Anxiety  . Breast cancer (CMS-HCC) 08/1998  Right Mastectomy  . Cataracts, bilateral  . Diabetes mellitus type 2, uncomplicated (CMS-HCC)  . Heart attack (CMS-HCC) 02/2018  . Heart disease  . History of positive PPD  INH 6 months 2007-2008  . Hyperlipidemia  . Hypertension  . Kidney stones  . Osteoarthritis ([place]) 11/16/2013  a. Right knee  . Osteoarthritis of right knee  . 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 INH 6 months 2007-2008.  Marland Kitchen Rheumatoid arthritis (CMS-HCC)  seropositive, methotrexate complicated by nausea and hair thinning, Sjogren's staus post tear duct pluggings, but negative Sjogren's antibodies, intolerance to leflunomide and sulfa with GI upset, Plaquenil, s/p Remicade, stopped by patient because of concern about infections, [place]  . Stroke (CMS-HCC)  Left-sided numbness. CT with CVA. Carotids Negative. CT angiogram without Aneurysm   Past Surgical History: Past Surgical History:  Procedure Laterality Date  . ABDOMINAL HYSTERECTOMY W/ PARTIAL VAGINACTOMY  . bladder tack  . CARDIAC CATHETERIZATION 07-2010  CAD with distal RCA stenosis-PCI to RCA-  . COLONOSCOPY 04/2012  TUBULAR ADENOMA; REPEAT 2019  . COLONOSCOPY 12/08/2017  Tubular adenoma of the colon/Hyperplastic colon polyp/Repeat 8yr/TKT  . EYELID SURGERY  . HYSTERECTOMY  . LAPAROSCOPIC SALPINGECTOMY Bilateral 08/2016  And Oopherectomy ( Dr CLarey Days  . 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 Right knee surgery   Social History: Social History   Socioeconomic History  . Marital status: Widowed  Spouse name: Not on file  . Number of children: 3  . Years of education: 188 . Highest education  level: High school graduate  Occupational History  . Occupation: Retired  Tobacco Use  . Smoking status: Never Smoker  . Smokeless tobacco: Never Used  Vaping Use  . Vaping Use: Never used  Substance and Sexual Activity  . Alcohol use: No  Alcohol/week: 0.0 standard drinks  . Drug use: No  . Sexual activity: Defer  Partners: Male  Birth control/protection: Post-menopausal  Other Topics Concern  . Not on file  Social History Narrative  . Not on file   Social Determinants of Health   Financial Resource Strain: Not on file  Food Insecurity: Not on file  Transportation Needs: Not on file  Physical Activity: Not on file  Stress: Not on file  Social Connections: Not on file  Housing Stability: Not on file   Family History: Family History  Problem Relation Age of Onset  . Diabetes Mother  . High blood pressure (Hypertension) Mother  . Other Mother  Heart condition  . Diabetes type II Mother  . Colon polyps Mother  . No Known Problems Father   Review of Systems: A comprehensive 14 point ROS was performed, reviewed, and the pertinent  orthopaedic findings are documented in the HPI.  Exam LMP (LMP Unknown)   General:  Well-developed, well-nourished female seen in no acute distress.  Antalgic gait. Varus thrust to the right knee. Ambulates with a cane  HEENT:  Atraumatic, normocephalic. Pupils are equal and reactive to light. Extraocular motion is intact. Sclera are clear. Oropharynx is clear with moist mucosa.  Neck:  Supple, nontender, and with good ROM. No thyromegaly, adenopathy, JVD, or carotid bruits.  Lungs:  Clear to auscultation bilaterally.  Cardiovascular:  Regular rate and rhythm. Normal S1, S2. No murmur . No appreciable gallops or rubs. Peripheral pulses are palpable. No lower extremity edema. Homan`s test is negative.  Abdomen:  Soft, nontender, nondistended. Bowel sounds are present.  Extremities: Good strength, stability, and range of motion of  the upper extremities. Good range of motion of the hips and ankles.  Right Knee: Soft tissue swelling: mild Effusion: none Erythema: none Crepitance: mild Tenderness: medial Alignment: relative varus Mediolateral laxity: medial pseudolaxity Posterior sag: negative Patellar tracking: Good tracking without evidence of subluxation or tilt Atrophy: No significant atrophy.  Quadriceps tone was fair to good. Range of motion: 10-100 degrees  Neurologic:  Awake, alert, and oriented.  Sensory function is intact to pinprick and light touch.  Motor strength is judged to be 5/5.  Motor coordination is within normal limits.  No apparent clonus. No tremor.   Imaging: AP lateral and sunrise views of the right knee are ordered interpreted by me in the office today. Impression: Patient has 90% loss of joint space in the medial compartment with subchondral sclerosing and mild subchondral cyst formation along the medial tibial plateau. There is sclerotic changes in the lateral compartment with joint space narrowing in the lateral compartment. Moderate to severe subchondral changes in the patellofemoral compartment. Patella tracks well the trochlear groove. No evidence of acute bony abnormality.  Impression: Severe tricompartmental osteoarthritis most severe in the medial compartment  Plan:  67. 74 year old female with severe right knee osteoarthritis who is failed all conservative treatment. Pain is interfering with quality of life and activities day living. Risks, benefits, complications of a right total knee arthroplasty have been discussed with the patient. Patient has agreed and consented to a right total knee arthroplasty with Dr. Marry Guan on 03/14/2020.  Rachelle Hora, PA-C  This note was generated in part with voice recognition software and I apologize for any typographical errors that were not detected and corrected.   Electronically signed by Elaine Gottron, PA at 03/07/2020 4:14 PM  EST

## 2020-03-12 ENCOUNTER — Other Ambulatory Visit
Admission: RE | Admit: 2020-03-12 | Discharge: 2020-03-12 | Disposition: A | Discharge status: Home / Self Care | Payer: Medicare HMO | Source: Ambulatory Visit | Attending: Orthopedic Surgery | Admitting: Orthopedic Surgery

## 2020-03-12 ENCOUNTER — Other Ambulatory Visit: Payer: Self-pay

## 2020-03-12 DIAGNOSIS — Z7984 Long term (current) use of oral hypoglycemic drugs: Secondary | ICD-10-CM | POA: Diagnosis not present

## 2020-03-12 DIAGNOSIS — K219 Gastro-esophageal reflux disease without esophagitis: Secondary | ICD-10-CM | POA: Diagnosis present

## 2020-03-12 DIAGNOSIS — Z96659 Presence of unspecified artificial knee joint: Secondary | ICD-10-CM | POA: Diagnosis not present

## 2020-03-12 DIAGNOSIS — I252 Old myocardial infarction: Secondary | ICD-10-CM | POA: Diagnosis not present

## 2020-03-12 DIAGNOSIS — Z170 Estrogen receptor positive status [ER+]: Secondary | ICD-10-CM | POA: Diagnosis not present

## 2020-03-12 DIAGNOSIS — R531 Weakness: Secondary | ICD-10-CM | POA: Diagnosis not present

## 2020-03-12 DIAGNOSIS — Z9221 Personal history of antineoplastic chemotherapy: Secondary | ICD-10-CM | POA: Diagnosis not present

## 2020-03-12 DIAGNOSIS — I10 Essential (primary) hypertension: Secondary | ICD-10-CM | POA: Diagnosis present

## 2020-03-12 DIAGNOSIS — Z7982 Long term (current) use of aspirin: Secondary | ICD-10-CM | POA: Diagnosis not present

## 2020-03-12 DIAGNOSIS — Z90710 Acquired absence of both cervix and uterus: Secondary | ICD-10-CM | POA: Diagnosis not present

## 2020-03-12 DIAGNOSIS — M1711 Unilateral primary osteoarthritis, right knee: Secondary | ICD-10-CM | POA: Diagnosis present

## 2020-03-12 DIAGNOSIS — M3500 Sicca syndrome, unspecified: Secondary | ICD-10-CM | POA: Diagnosis present

## 2020-03-12 DIAGNOSIS — R0602 Shortness of breath: Secondary | ICD-10-CM | POA: Diagnosis not present

## 2020-03-12 DIAGNOSIS — I2510 Atherosclerotic heart disease of native coronary artery without angina pectoris: Secondary | ICD-10-CM | POA: Diagnosis present

## 2020-03-12 DIAGNOSIS — Z20822 Contact with and (suspected) exposure to covid-19: Secondary | ICD-10-CM | POA: Diagnosis present

## 2020-03-12 DIAGNOSIS — Z01812 Encounter for preprocedural laboratory examination: Secondary | ICD-10-CM | POA: Insufficient documentation

## 2020-03-12 DIAGNOSIS — M1990 Unspecified osteoarthritis, unspecified site: Secondary | ICD-10-CM | POA: Diagnosis not present

## 2020-03-12 DIAGNOSIS — M059 Rheumatoid arthritis with rheumatoid factor, unspecified: Secondary | ICD-10-CM | POA: Diagnosis present

## 2020-03-12 DIAGNOSIS — Z79899 Other long term (current) drug therapy: Secondary | ICD-10-CM | POA: Diagnosis not present

## 2020-03-12 DIAGNOSIS — Z96651 Presence of right artificial knee joint: Secondary | ICD-10-CM | POA: Diagnosis not present

## 2020-03-12 DIAGNOSIS — E119 Type 2 diabetes mellitus without complications: Secondary | ICD-10-CM | POA: Diagnosis present

## 2020-03-12 DIAGNOSIS — E785 Hyperlipidemia, unspecified: Secondary | ICD-10-CM | POA: Diagnosis present

## 2020-03-12 DIAGNOSIS — Z471 Aftercare following joint replacement surgery: Secondary | ICD-10-CM | POA: Diagnosis not present

## 2020-03-12 DIAGNOSIS — Z853 Personal history of malignant neoplasm of breast: Secondary | ICD-10-CM | POA: Diagnosis not present

## 2020-03-12 DIAGNOSIS — Z8673 Personal history of transient ischemic attack (TIA), and cerebral infarction without residual deficits: Secondary | ICD-10-CM | POA: Diagnosis not present

## 2020-03-12 DIAGNOSIS — Z9011 Acquired absence of right breast and nipple: Secondary | ICD-10-CM | POA: Diagnosis not present

## 2020-03-13 ENCOUNTER — Encounter: Payer: Self-pay | Admitting: Orthopedic Surgery

## 2020-03-13 DIAGNOSIS — R102 Pelvic and perineal pain unspecified side: Secondary | ICD-10-CM | POA: Insufficient documentation

## 2020-03-13 LAB — SARS CORONAVIRUS 2 (TAT 6-24 HRS): SARS Coronavirus 2: NEGATIVE

## 2020-03-14 ENCOUNTER — Inpatient Hospital Stay: Payer: Medicare HMO

## 2020-03-14 ENCOUNTER — Other Ambulatory Visit: Payer: Self-pay

## 2020-03-14 ENCOUNTER — Inpatient Hospital Stay: Payer: Medicare HMO | Admitting: Urgent Care

## 2020-03-14 ENCOUNTER — Inpatient Hospital Stay
Admission: RE | Admit: 2020-03-14 | Discharge: 2020-03-16 | DRG: 470 | Disposition: A | Discharge status: Home Health | Payer: Medicare HMO | Attending: Orthopedic Surgery | Admitting: Orthopedic Surgery

## 2020-03-14 ENCOUNTER — Encounter: Payer: Self-pay | Admitting: Orthopedic Surgery

## 2020-03-14 ENCOUNTER — Encounter
Admission: RE | Disposition: A | Discharge status: Home Health | Payer: Self-pay | Source: Home / Self Care | Attending: Orthopedic Surgery

## 2020-03-14 DIAGNOSIS — Z7982 Long term (current) use of aspirin: Secondary | ICD-10-CM | POA: Diagnosis not present

## 2020-03-14 DIAGNOSIS — M3500 Sicca syndrome, unspecified: Secondary | ICD-10-CM | POA: Diagnosis present

## 2020-03-14 DIAGNOSIS — Z9221 Personal history of antineoplastic chemotherapy: Secondary | ICD-10-CM

## 2020-03-14 DIAGNOSIS — Z8673 Personal history of transient ischemic attack (TIA), and cerebral infarction without residual deficits: Secondary | ICD-10-CM | POA: Diagnosis not present

## 2020-03-14 DIAGNOSIS — Z20822 Contact with and (suspected) exposure to covid-19: Secondary | ICD-10-CM | POA: Diagnosis present

## 2020-03-14 DIAGNOSIS — Z7984 Long term (current) use of oral hypoglycemic drugs: Secondary | ICD-10-CM

## 2020-03-14 DIAGNOSIS — E785 Hyperlipidemia, unspecified: Secondary | ICD-10-CM | POA: Diagnosis present

## 2020-03-14 DIAGNOSIS — I10 Essential (primary) hypertension: Secondary | ICD-10-CM | POA: Diagnosis present

## 2020-03-14 DIAGNOSIS — K219 Gastro-esophageal reflux disease without esophagitis: Secondary | ICD-10-CM | POA: Diagnosis present

## 2020-03-14 DIAGNOSIS — E119 Type 2 diabetes mellitus without complications: Secondary | ICD-10-CM | POA: Diagnosis present

## 2020-03-14 DIAGNOSIS — Z170 Estrogen receptor positive status [ER+]: Secondary | ICD-10-CM

## 2020-03-14 DIAGNOSIS — I252 Old myocardial infarction: Secondary | ICD-10-CM | POA: Diagnosis not present

## 2020-03-14 DIAGNOSIS — M1990 Unspecified osteoarthritis, unspecified site: Secondary | ICD-10-CM | POA: Diagnosis not present

## 2020-03-14 DIAGNOSIS — I2510 Atherosclerotic heart disease of native coronary artery without angina pectoris: Secondary | ICD-10-CM | POA: Diagnosis present

## 2020-03-14 DIAGNOSIS — Z79899 Other long term (current) drug therapy: Secondary | ICD-10-CM | POA: Diagnosis not present

## 2020-03-14 DIAGNOSIS — M059 Rheumatoid arthritis with rheumatoid factor, unspecified: Secondary | ICD-10-CM | POA: Diagnosis present

## 2020-03-14 DIAGNOSIS — Z90710 Acquired absence of both cervix and uterus: Secondary | ICD-10-CM

## 2020-03-14 DIAGNOSIS — Z853 Personal history of malignant neoplasm of breast: Secondary | ICD-10-CM | POA: Diagnosis not present

## 2020-03-14 DIAGNOSIS — R0602 Shortness of breath: Secondary | ICD-10-CM | POA: Diagnosis not present

## 2020-03-14 DIAGNOSIS — M1711 Unilateral primary osteoarthritis, right knee: Secondary | ICD-10-CM | POA: Diagnosis present

## 2020-03-14 DIAGNOSIS — Z9011 Acquired absence of right breast and nipple: Secondary | ICD-10-CM | POA: Diagnosis not present

## 2020-03-14 DIAGNOSIS — Z96659 Presence of unspecified artificial knee joint: Secondary | ICD-10-CM

## 2020-03-14 HISTORY — DX: Cardiac murmur, unspecified: R01.1

## 2020-03-14 HISTORY — PX: KNEE ARTHROPLASTY: SHX992

## 2020-03-14 LAB — GLUCOSE, CAPILLARY
Glucose-Capillary: 153 mg/dL — ABNORMAL HIGH (ref 70–99)
Glucose-Capillary: 200 mg/dL — ABNORMAL HIGH (ref 70–99)
Glucose-Capillary: 193 mg/dL — ABNORMAL HIGH (ref 70–99)
Glucose-Capillary: 225 mg/dL — ABNORMAL HIGH (ref 70–99)

## 2020-03-14 SURGERY — ARTHROPLASTY, KNEE, TOTAL, USING IMAGELESS COMPUTER-ASSISTED NAVIGATION
Anesthesia: Spinal | Site: Knee | Laterality: Right

## 2020-03-14 MED ORDER — BUPIVACAINE HCL (PF) 0.25 % IJ SOLN
INTRAMUSCULAR | Status: DC | PRN
  Administered 2020-03-14: 60 mL

## 2020-03-14 MED ORDER — GABAPENTIN 300 MG PO CAPS
ORAL_CAPSULE | ORAL | Status: AC
  Administered 2020-03-14: 300 mg via ORAL
  Filled 2020-03-14: qty 1

## 2020-03-14 MED ORDER — ACETAMINOPHEN 10 MG/ML IV SOLN
INTRAVENOUS | Status: AC
  Filled 2020-03-14: qty 100

## 2020-03-14 MED ORDER — ACETAMINOPHEN 10 MG/ML IV SOLN
1000.0000 mg | Freq: Four times a day (QID) | INTRAVENOUS | Status: AC
  Administered 2020-03-14 – 2020-03-15 (×4): 1000 mg via INTRAVENOUS
  Filled 2020-03-14 (×4): qty 100

## 2020-03-14 MED ORDER — INSULIN ASPART 100 UNIT/ML ~~LOC~~ SOLN
2.0000 [IU] | Freq: Once | SUBCUTANEOUS | Status: AC
  Administered 2020-03-14: 2 [IU] via SUBCUTANEOUS

## 2020-03-14 MED ORDER — HYDROXYZINE HCL 10 MG PO TABS
10.0000 mg | ORAL_TABLET | Freq: Every day | ORAL | Status: DC
  Administered 2020-03-15 – 2020-03-16 (×2): 10 mg via ORAL
  Filled 2020-03-14 (×2): qty 1

## 2020-03-14 MED ORDER — PHENOL 1.4 % MT LIQD
1.0000 | OROMUCOSAL | Status: DC | PRN
  Filled 2020-03-14: qty 177

## 2020-03-14 MED ORDER — CEFAZOLIN SODIUM-DEXTROSE 2-4 GM/100ML-% IV SOLN
2.0000 g | Freq: Four times a day (QID) | INTRAVENOUS | Status: AC
  Administered 2020-03-14 – 2020-03-15 (×2): 2 g via INTRAVENOUS
  Filled 2020-03-14 (×2): qty 100

## 2020-03-14 MED ORDER — GABAPENTIN 300 MG PO CAPS
300.0000 mg | ORAL_CAPSULE | Freq: Every day | ORAL | Status: DC
  Administered 2020-03-14 – 2020-03-15 (×2): 300 mg via ORAL
  Filled 2020-03-14 (×2): qty 1

## 2020-03-14 MED ORDER — SODIUM CHLORIDE 0.9 % IV SOLN
INTRAVENOUS | Status: DC | PRN
  Administered 2020-03-14: 60 mL

## 2020-03-14 MED ORDER — HYDROMORPHONE HCL 1 MG/ML IJ SOLN: 0.5000 mg | INTRAMUSCULAR | Status: DC | PRN

## 2020-03-14 MED ORDER — CELECOXIB 200 MG PO CAPS: 400.0000 mg | ORAL_CAPSULE | Freq: Once | ORAL | Status: AC

## 2020-03-14 MED ORDER — TRANEXAMIC ACID-NACL 1000-0.7 MG/100ML-% IV SOLN
INTRAVENOUS | Status: AC
  Filled 2020-03-14: qty 100

## 2020-03-14 MED ORDER — HYDRALAZINE HCL 20 MG/ML IJ SOLN
INTRAMUSCULAR | Status: DC | PRN
  Administered 2020-03-14 (×2): 5 mg via INTRAVENOUS

## 2020-03-14 MED ORDER — ENSURE PRE-SURGERY PO LIQD
296.0000 mL | Freq: Once | ORAL | Status: DC
  Filled 2020-03-14: qty 296

## 2020-03-14 MED ORDER — MENTHOL 3 MG MT LOZG
1.0000 | LOZENGE | OROMUCOSAL | Status: DC | PRN
  Filled 2020-03-14: qty 9

## 2020-03-14 MED ORDER — ALUM & MAG HYDROXIDE-SIMETH 200-200-20 MG/5ML PO SUSP: 30.0000 mL | ORAL | Status: DC | PRN

## 2020-03-14 MED ORDER — SURGIPHOR WOUND IRRIGATION SYSTEM - OPTIME
TOPICAL | Status: DC | PRN
  Administered 2020-03-14: 1 via TOPICAL

## 2020-03-14 MED ORDER — SODIUM CHLORIDE 0.9 % IV SOLN: INTRAVENOUS | Status: DC

## 2020-03-14 MED ORDER — METOPROLOL SUCCINATE ER 50 MG PO TB24
100.0000 mg | ORAL_TABLET | Freq: Every day | ORAL | Status: DC
  Administered 2020-03-15: 100 mg via ORAL
  Filled 2020-03-14: qty 2

## 2020-03-14 MED ORDER — METOCLOPRAMIDE HCL 10 MG PO TABS
10.0000 mg | ORAL_TABLET | Freq: Three times a day (TID) | ORAL | Status: AC
  Administered 2020-03-14 – 2020-03-16 (×8): 10 mg via ORAL
  Filled 2020-03-14 (×8): qty 1

## 2020-03-14 MED ORDER — ONDANSETRON HCL 4 MG/2ML IJ SOLN: 4.0000 mg | Freq: Four times a day (QID) | INTRAMUSCULAR | Status: DC | PRN

## 2020-03-14 MED ORDER — DEXAMETHASONE SODIUM PHOSPHATE 10 MG/ML IJ SOLN
INTRAMUSCULAR | Status: AC
  Administered 2020-03-14: 8 mg via INTRAVENOUS
  Filled 2020-03-14: qty 1

## 2020-03-14 MED ORDER — BUPIVACAINE HCL (PF) 0.5 % IJ SOLN
INTRAMUSCULAR | Status: DC | PRN
  Administered 2020-03-14: 2.8 mL

## 2020-03-14 MED ORDER — FENTANYL CITRATE (PF) 100 MCG/2ML IJ SOLN: 25.0000 ug | INTRAMUSCULAR | Status: DC | PRN

## 2020-03-14 MED ORDER — DIPHENHYDRAMINE HCL 12.5 MG/5ML PO ELIX: 12.5000 mg | ORAL_SOLUTION | ORAL | Status: DC | PRN

## 2020-03-14 MED ORDER — TRAMADOL HCL 50 MG PO TABS
50.0000 mg | ORAL_TABLET | ORAL | Status: DC | PRN
  Administered 2020-03-14: 50 mg via ORAL
  Filled 2020-03-14: qty 1

## 2020-03-14 MED ORDER — TRANEXAMIC ACID-NACL 1000-0.7 MG/100ML-% IV SOLN: 1000.0000 mg | INTRAVENOUS | Status: DC

## 2020-03-14 MED ORDER — MAGNESIUM HYDROXIDE 400 MG/5ML PO SUSP
30.0000 mL | Freq: Every day | ORAL | Status: DC
  Administered 2020-03-15 – 2020-03-16 (×2): 30 mL via ORAL
  Filled 2020-03-14 (×2): qty 30

## 2020-03-14 MED ORDER — ONDANSETRON HCL 4 MG PO TABS: 4.0000 mg | ORAL_TABLET | Freq: Four times a day (QID) | ORAL | Status: DC | PRN

## 2020-03-14 MED ORDER — CELECOXIB 200 MG PO CAPS
ORAL_CAPSULE | ORAL | Status: AC
  Administered 2020-03-14: 400 mg via ORAL
  Filled 2020-03-14: qty 2

## 2020-03-14 MED ORDER — CHLORHEXIDINE GLUCONATE 4 % EX LIQD
60.0000 mL | Freq: Once | CUTANEOUS | Status: AC
  Administered 2020-03-14: 4 via TOPICAL

## 2020-03-14 MED ORDER — ONDANSETRON HCL 4 MG/2ML IJ SOLN
INTRAMUSCULAR | Status: DC | PRN
  Administered 2020-03-14: 4 mg via INTRAVENOUS

## 2020-03-14 MED ORDER — PROPOFOL 500 MG/50ML IV EMUL
INTRAVENOUS | Status: DC | PRN
  Administered 2020-03-14: 50 ug/kg/min via INTRAVENOUS

## 2020-03-14 MED ORDER — ALPRAZOLAM 0.25 MG PO TABS
0.2500 mg | ORAL_TABLET | Freq: Every day | ORAL | Status: DC
  Administered 2020-03-14 – 2020-03-15 (×2): 0.25 mg via ORAL
  Filled 2020-03-14 (×2): qty 1

## 2020-03-14 MED ORDER — INSULIN ASPART 100 UNIT/ML ~~LOC~~ SOLN
0.0000 [IU] | Freq: Three times a day (TID) | SUBCUTANEOUS | Status: DC
  Administered 2020-03-14: 3 [IU] via SUBCUTANEOUS
  Administered 2020-03-15: 5 [IU] via SUBCUTANEOUS
  Administered 2020-03-15: 3 [IU] via SUBCUTANEOUS
  Administered 2020-03-15: 8 [IU] via SUBCUTANEOUS
  Administered 2020-03-16: 3 [IU] via SUBCUTANEOUS
  Administered 2020-03-16: 2 [IU] via SUBCUTANEOUS
  Filled 2020-03-14 (×6): qty 1

## 2020-03-14 MED ORDER — TRANEXAMIC ACID 1000 MG/10ML IV SOLN
INTRAVENOUS | Status: AC
  Filled 2020-03-14: qty 10

## 2020-03-14 MED ORDER — MIDAZOLAM HCL 2 MG/2ML IJ SOLN
INTRAMUSCULAR | Status: AC
  Filled 2020-03-14: qty 2

## 2020-03-14 MED ORDER — NITROGLYCERIN 0.4 MG SL SUBL: 0.4000 mg | SUBLINGUAL_TABLET | SUBLINGUAL | Status: DC | PRN

## 2020-03-14 MED ORDER — CEFAZOLIN SODIUM-DEXTROSE 2-4 GM/100ML-% IV SOLN
INTRAVENOUS | Status: AC
  Filled 2020-03-14: qty 100

## 2020-03-14 MED ORDER — PROPOFOL 500 MG/50ML IV EMUL
INTRAVENOUS | Status: AC
  Filled 2020-03-14: qty 50

## 2020-03-14 MED ORDER — GABAPENTIN 300 MG PO CAPS: 300.0000 mg | ORAL_CAPSULE | Freq: Once | ORAL | Status: AC

## 2020-03-14 MED ORDER — CHLORHEXIDINE GLUCONATE 0.12 % MT SOLN
OROMUCOSAL | Status: AC
  Administered 2020-03-14: 15 mL via OROMUCOSAL
  Filled 2020-03-14: qty 15

## 2020-03-14 MED ORDER — ENOXAPARIN SODIUM 30 MG/0.3ML ~~LOC~~ SOLN
30.0000 mg | Freq: Two times a day (BID) | SUBCUTANEOUS | Status: DC
  Administered 2020-03-15 – 2020-03-16 (×3): 30 mg via SUBCUTANEOUS
  Filled 2020-03-14 (×3): qty 0.3

## 2020-03-14 MED ORDER — POLYVINYL ALCOHOL-POVIDONE 5-6 MG/ML OP SOLN: Freq: Every day | OPHTHALMIC | Status: DC | PRN

## 2020-03-14 MED ORDER — TRANEXAMIC ACID-NACL 1000-0.7 MG/100ML-% IV SOLN: 1000.0000 mg | Freq: Once | INTRAVENOUS | Status: DC

## 2020-03-14 MED ORDER — SODIUM CHLORIDE 0.9 % IR SOLN
Status: DC | PRN
  Administered 2020-03-14: 500 mL

## 2020-03-14 MED ORDER — SENNOSIDES-DOCUSATE SODIUM 8.6-50 MG PO TABS
1.0000 | ORAL_TABLET | Freq: Two times a day (BID) | ORAL | Status: DC
  Administered 2020-03-14 – 2020-03-16 (×4): 1 via ORAL
  Filled 2020-03-14 (×4): qty 1

## 2020-03-14 MED ORDER — PANTOPRAZOLE SODIUM 40 MG PO TBEC
40.0000 mg | DELAYED_RELEASE_TABLET | Freq: Two times a day (BID) | ORAL | Status: DC
  Administered 2020-03-14 – 2020-03-16 (×4): 40 mg via ORAL
  Filled 2020-03-14 (×4): qty 1

## 2020-03-14 MED ORDER — DEXAMETHASONE SODIUM PHOSPHATE 10 MG/ML IJ SOLN: 8.0000 mg | Freq: Once | INTRAMUSCULAR | Status: AC

## 2020-03-14 MED ORDER — PROPOFOL 10 MG/ML IV BOLUS
INTRAVENOUS | Status: DC | PRN
  Administered 2020-03-14: 40 mg via INTRAVENOUS

## 2020-03-14 MED ORDER — CELECOXIB 200 MG PO CAPS
200.0000 mg | ORAL_CAPSULE | Freq: Two times a day (BID) | ORAL | Status: DC
  Administered 2020-03-14 – 2020-03-16 (×4): 200 mg via ORAL
  Filled 2020-03-14 (×4): qty 1

## 2020-03-14 MED ORDER — INSULIN ASPART 100 UNIT/ML ~~LOC~~ SOLN
SUBCUTANEOUS | Status: AC
  Filled 2020-03-14: qty 1

## 2020-03-14 MED ORDER — ACETAMINOPHEN 325 MG PO TABS: 325.0000 mg | ORAL_TABLET | Freq: Four times a day (QID) | ORAL | Status: DC | PRN

## 2020-03-14 MED ORDER — ORAL CARE MOUTH RINSE: 15.0000 mL | Freq: Once | OROMUCOSAL | Status: AC

## 2020-03-14 MED ORDER — MIDAZOLAM HCL 5 MG/5ML IJ SOLN
INTRAMUSCULAR | Status: DC | PRN
  Administered 2020-03-14: 2 mg via INTRAVENOUS

## 2020-03-14 MED ORDER — METFORMIN HCL 500 MG PO TABS
500.0000 mg | ORAL_TABLET | Freq: Two times a day (BID) | ORAL | Status: DC
  Administered 2020-03-15 – 2020-03-16 (×3): 500 mg via ORAL
  Filled 2020-03-14 (×3): qty 1

## 2020-03-14 MED ORDER — POLYVINYL ALCOHOL 1.4 % OP SOLN
1.0000 [drp] | Freq: Every day | OPHTHALMIC | Status: DC | PRN
  Filled 2020-03-14: qty 15

## 2020-03-14 MED ORDER — BISACODYL 10 MG RE SUPP: 10.0000 mg | Freq: Every day | RECTAL | Status: DC | PRN

## 2020-03-14 MED ORDER — CEFAZOLIN SODIUM-DEXTROSE 2-4 GM/100ML-% IV SOLN
2.0000 g | INTRAVENOUS | Status: AC
  Administered 2020-03-14: 2 g via INTRAVENOUS

## 2020-03-14 MED ORDER — CHLORHEXIDINE GLUCONATE 0.12 % MT SOLN: 15.0000 mL | Freq: Once | OROMUCOSAL | Status: AC

## 2020-03-14 MED ORDER — ACETAMINOPHEN 10 MG/ML IV SOLN
INTRAVENOUS | Status: DC | PRN
  Administered 2020-03-14: 1000 mg via INTRAVENOUS

## 2020-03-14 MED ORDER — PNEUMOCOCCAL VAC POLYVALENT 25 MCG/0.5ML IJ INJ: 0.5000 mL | INJECTION | INTRAMUSCULAR | Status: DC

## 2020-03-14 MED ORDER — FLEET ENEMA 7-19 GM/118ML RE ENEM: 1.0000 | ENEMA | Freq: Once | RECTAL | Status: DC | PRN

## 2020-03-14 MED ORDER — ONDANSETRON HCL 4 MG/2ML IJ SOLN: 4.0000 mg | Freq: Once | INTRAMUSCULAR | Status: DC | PRN

## 2020-03-14 MED ORDER — ROSUVASTATIN CALCIUM 5 MG PO TABS
5.0000 mg | ORAL_TABLET | ORAL | Status: DC
  Administered 2020-03-16: 5 mg via ORAL
  Filled 2020-03-14 (×2): qty 1

## 2020-03-14 MED ORDER — OXYCODONE HCL 5 MG PO TABS
5.0000 mg | ORAL_TABLET | ORAL | Status: DC | PRN
  Administered 2020-03-14: 5 mg via ORAL
  Administered 2020-03-14 – 2020-03-16 (×2): 10 mg via ORAL
  Filled 2020-03-14: qty 2
  Filled 2020-03-14: qty 1
  Filled 2020-03-14: qty 2

## 2020-03-14 MED ORDER — FERROUS SULFATE 325 (65 FE) MG PO TABS
325.0000 mg | ORAL_TABLET | Freq: Two times a day (BID) | ORAL | Status: DC
  Administered 2020-03-14 – 2020-03-16 (×4): 325 mg via ORAL
  Filled 2020-03-14 (×4): qty 1

## 2020-03-14 MED ORDER — AMLODIPINE BESYLATE 5 MG PO TABS
2.5000 mg | ORAL_TABLET | Freq: Every day | ORAL | Status: DC
  Administered 2020-03-15 – 2020-03-16 (×2): 2.5 mg via ORAL
  Filled 2020-03-14 (×2): qty 1

## 2020-03-14 SURGICAL SUPPLY — 81 items
ATTUNE PSFEM RTSZ5 NARCEM KNEE (Femur) ×2 IMPLANT
ATTUNE PSRP INSE SZ 5 7MM KNEE (Insert) ×1 IMPLANT
ATTUNE PSRP INSE SZ5 7 KNEE (Insert) ×1 IMPLANT
BASE TIBIAL ROT PLAT SZ 5 KNEE (Knees) IMPLANT
BATTERY INSTRU NAVIGATION (MISCELLANEOUS) ×12 IMPLANT
BLADE SAW 70X12.5 (BLADE) ×3 IMPLANT
BLADE SAW 90X13X1.19 OSCILLAT (BLADE) ×3 IMPLANT
BLADE SAW 90X25X1.19 OSCILLAT (BLADE) ×3 IMPLANT
BONE CEMENT GENTAMICIN (Cement) ×6 IMPLANT
BSPLAT TIB 5 CMNT ROT PLAT STR (Knees) ×1 IMPLANT
BTRY SRG DRVR LF (MISCELLANEOUS) ×4
CANISTER PREVENA PLUS 150 (CANNISTER) ×3 IMPLANT
CANISTER SUCT 3000ML PPV (MISCELLANEOUS) ×3 IMPLANT
CEMENT BONE GENTAMICIN 40 (Cement) IMPLANT
COOLER POLAR GLACIER W/PUMP (MISCELLANEOUS) ×3 IMPLANT
COVER WAND RF STERILE (DRAPES) ×3 IMPLANT
CUFF TOURN SGL QUICK 24 (TOURNIQUET CUFF)
CUFF TOURN SGL QUICK 30 (TOURNIQUET CUFF)
CUFF TRNQT CYL 24X4X16.5-23 (TOURNIQUET CUFF) IMPLANT
CUFF TRNQT CYL 30X4X21-28X (TOURNIQUET CUFF) IMPLANT
DRAPE 3/4 80X56 (DRAPES) ×3 IMPLANT
DRSG DERMACEA 8X12 NADH (GAUZE/BANDAGES/DRESSINGS) ×3 IMPLANT
DRSG MEPILEX SACRM 8.7X9.8 (GAUZE/BANDAGES/DRESSINGS) ×3 IMPLANT
DRSG OPSITE POSTOP 4X14 (GAUZE/BANDAGES/DRESSINGS) ×3 IMPLANT
DRSG TEGADERM 4X4.75 (GAUZE/BANDAGES/DRESSINGS) ×3 IMPLANT
DURAPREP 26ML APPLICATOR (WOUND CARE) ×6 IMPLANT
ELECT REM PT RETURN 9FT ADLT (ELECTROSURGICAL) ×3
ELECTRODE REM PT RTRN 9FT ADLT (ELECTROSURGICAL) ×1 IMPLANT
EX-PIN ORTHOLOCK NAV 4X150 (PIN) ×6 IMPLANT
GLOVE BIO SURGEON STRL SZ7.5 (GLOVE) ×6 IMPLANT
GLOVE BIOGEL M STRL SZ7.5 (GLOVE) ×6 IMPLANT
GLOVE BIOGEL PI IND STRL 7.5 (GLOVE) ×1 IMPLANT
GLOVE BIOGEL PI INDICATOR 7.5 (GLOVE) ×2
GLOVE INDICATOR 8.0 STRL GRN (GLOVE) ×3 IMPLANT
GOWN STRL REUS W/ TWL LRG LVL3 (GOWN DISPOSABLE) ×2 IMPLANT
GOWN STRL REUS W/ TWL XL LVL3 (GOWN DISPOSABLE) ×1 IMPLANT
GOWN STRL REUS W/TWL LRG LVL3 (GOWN DISPOSABLE) ×6
GOWN STRL REUS W/TWL XL LVL3 (GOWN DISPOSABLE) ×3
HEMOVAC 400CC 10FR (MISCELLANEOUS) ×3 IMPLANT
HOLDER FOLEY CATH W/STRAP (MISCELLANEOUS) ×3 IMPLANT
HOOD PEEL AWAY FLYTE STAYCOOL (MISCELLANEOUS) ×6 IMPLANT
IRRIGATION SURGIPHOR STRL (IV SOLUTION) ×3 IMPLANT
KIT PREVENA INCISION MGT20CM45 (CANNISTER) ×3 IMPLANT
KIT PUMP PREVENA PLUS 14DAY (MISCELLANEOUS) ×3 IMPLANT
KIT TURNOVER KIT A (KITS) ×3 IMPLANT
KNIFE SCULPS 14X20 (INSTRUMENTS) ×3 IMPLANT
LABEL OR SOLS (LABEL) ×3 IMPLANT
MANIFOLD NEPTUNE II (INSTRUMENTS) ×6 IMPLANT
NDL SAFETY ECLIPSE 18X1.5 (NEEDLE) ×1 IMPLANT
NDL SPNL 20GX3.5 QUINCKE YW (NEEDLE) ×2 IMPLANT
NEEDLE HYPO 18GX1.5 SHARP (NEEDLE) ×3
NEEDLE SPNL 20GX3.5 QUINCKE YW (NEEDLE) ×6 IMPLANT
NS IRRIG 500ML POUR BTL (IV SOLUTION) ×3 IMPLANT
PACK TOTAL KNEE (MISCELLANEOUS) ×3 IMPLANT
PAD WRAPON POLAR KNEE (MISCELLANEOUS) ×1 IMPLANT
PATELLA MEDIAL ATTUN 35MM KNEE (Knees) ×2 IMPLANT
PENCIL SMOKE ULTRAEVAC 22 CON (MISCELLANEOUS) ×3 IMPLANT
PIN FIXATION 1/8DIA X 3INL (PIN) ×9 IMPLANT
PULSAVAC PLUS IRRIG FAN TIP (DISPOSABLE) ×3
SOL .9 NS 3000ML IRR  AL (IV SOLUTION) ×3
SOL .9 NS 3000ML IRR AL (IV SOLUTION) ×1
SOL .9 NS 3000ML IRR UROMATIC (IV SOLUTION) ×1 IMPLANT
SOL PREP PVP 2OZ (MISCELLANEOUS) ×3
SOLUTION PREP PVP 2OZ (MISCELLANEOUS) ×1 IMPLANT
SPONGE DRAIN TRACH 4X4 STRL 2S (GAUZE/BANDAGES/DRESSINGS) ×3 IMPLANT
STAPLER SKIN PROX 35W (STAPLE) ×3 IMPLANT
STOCKINETTE IMPERV 14X48 (MISCELLANEOUS) IMPLANT
STRAP TIBIA SHORT (MISCELLANEOUS) ×3 IMPLANT
SUCTION FRAZIER HANDLE 10FR (MISCELLANEOUS) ×3
SUCTION TUBE FRAZIER 10FR DISP (MISCELLANEOUS) ×1 IMPLANT
SUT VIC AB 0 CT1 36 (SUTURE) ×6 IMPLANT
SUT VIC AB 1 CT1 36 (SUTURE) ×6 IMPLANT
SUT VIC AB 2-0 CT2 27 (SUTURE) ×3 IMPLANT
SYR 20ML LL LF (SYRINGE) ×3 IMPLANT
SYR 30ML LL (SYRINGE) ×6 IMPLANT
TIBIAL BASE ROT PLAT SZ 5 KNEE (Knees) ×3 IMPLANT
TIP FAN IRRIG PULSAVAC PLUS (DISPOSABLE) ×1 IMPLANT
TOWEL OR 17X26 4PK STRL BLUE (TOWEL DISPOSABLE) ×3 IMPLANT
TOWER CARTRIDGE SMART MIX (DISPOSABLE) ×3 IMPLANT
TRAY FOLEY MTR SLVR 16FR STAT (SET/KITS/TRAYS/PACK) ×3 IMPLANT
WRAPON POLAR PAD KNEE (MISCELLANEOUS) ×3

## 2020-03-14 NOTE — Progress Notes (Signed)
   03/14/20 0550  Clinical Encounter Type  Visited With Patient and family together  Visit Type Initial;Spiritual support  Referral From Nurse  Consult/Referral To Chaplain  Chaplain responded to an OR for AD. When chaplain arrived at the room, daughter was sitting at bedside. Chaplain educated Pt and daughter on AD. Daughter explained Pt has a POA, but her deceased son is listed on it and she needs to update it. Chaplain told them, when form is filled out to have a chaplain paged, then chaplain will find two witnesses and a notary. Chaplain asked if she could pray with them and they said yes. Chaplain prayed and left.

## 2020-03-14 NOTE — Anesthesia Preprocedure Evaluation (Addendum)
Anesthesia Evaluation  Patient identified by MRN, date of birth, ID band Patient awake    Reviewed: Allergy & Precautions, NPO status , Patient's Chart, lab work & pertinent test results, reviewed documented beta blocker date and time   Airway Mallampati: III  TM Distance: >3 FB     Dental  (+) Upper Dentures, Lower Dentures   Pulmonary shortness of breath,           Cardiovascular hypertension, Pt. on medications and Pt. on home beta blockers + angina + CAD, + Past MI, + Cardiac Stents and + Orthopnea  + Valvular Problems/Murmurs      Neuro/Psych PSYCHIATRIC DISORDERS Anxiety TIACVA    GI/Hepatic GERD  ,  Endo/Other  diabetes, Type 2  Renal/GU      Musculoskeletal  (+) Arthritis ,   Abdominal   Peds  Hematology negative hematology ROS (+)   Anesthesia Other Findings Sounds like she had a TIA, rather than a stroke.  Reproductive/Obstetrics                             Anesthesia Physical  Anesthesia Plan  ASA: III  Anesthesia Plan: Spinal   Post-op Pain Management:    Induction: Intravenous  PONV Risk Score and Plan:   Airway Management Planned: Nasal Cannula  Additional Equipment:   Intra-op Plan:   Post-operative Plan:   Informed Consent: I have reviewed the patients History and Physical, chart, labs and discussed the procedure including the risks, benefits and alternatives for the proposed anesthesia with the patient or authorized representative who has indicated his/her understanding and acceptance.     Dental advisory given  Plan Discussed with: CRNA and Surgeon  Anesthesia Plan Comments:        Anesthesia Quick Evaluation

## 2020-03-14 NOTE — Anesthesia Procedure Notes (Signed)
Spinal  Patient location during procedure: OR Start time: 03/14/2020 11:10 AM End time: 03/14/2020 11:13 AM Staffing Performed: resident/CRNA  Resident/CRNA: , , CRNA Preanesthetic Checklist Completed: patient identified, IV checked, site marked, risks and benefits discussed, surgical consent, monitors and equipment checked, pre-op evaluation and timeout performed Spinal Block Patient position: sitting Prep: Betadine Patient monitoring: heart rate, continuous pulse ox, blood pressure and cardiac monitor Approach: midline Location: L3-4 Injection technique: single-shot Needle Needle type: Whitacre and Introducer  Needle gauge: 25 G Needle length: 9 cm Assessment Sensory level: T10 Additional Notes Negative paresthesia. Negative blood return. Positive free-flowing CSF. Expiration date of kit checked and confirmed. Patient tolerated procedure well, without complications.       

## 2020-03-14 NOTE — Evaluation (Signed)
Physical Therapy Evaluation Patient Details Name: Elaine Cox MRN: [id number] DOB: [birth year] Today's Date: 03/14/2020   History of Present Illness  74 y/o female s/p R TKA 03/14/20.  Clinical Impression  Pt lethargic with limited ability to fully participate.  At ~1600 she was awake and alert and eager to work with PT (nursing finishing checking her in on the floor), however on arrival shortly before 1700 she was very lethargic, feeling "drunk" and struggling to keep her eyes open along with some nausea; per daughter this has happened with oxy in the past.  She showed good effort as she was able, but frankly it was a struggled to her to to do more than a rep or 2 of the exercises before needing to be woken back up.  Despite meds she had quite a bit of pain with modest POD0 exercises and gentle ROM tasks (limited to ~50* of flexion).  Deferred mobility 2/2 mental status/lethargy.  Will see in the AM and continued with TKA protocol as pt is able.  Pt hopes to go home but per today's performance may need STR, TBD.      Follow Up Recommendations SNF (pt hoping to improve enough to be able to go home)    Equipment Recommendations  3in1 (PT) (TBD)    Recommendations for Other Services       Precautions / Restrictions Precautions Precautions: Fall;Knee Precaution Booklet Issued: Yes (comment) (HEP) Restrictions Weight Bearing Restrictions: Yes RLE Weight Bearing: Weight bearing as tolerated      Mobility  Bed Mobility               General bed mobility comments: deferred mobility secondary to pt falling sleep repeatedly t/o exercises    Transfers                    Ambulation/Gait                Stairs            Wheelchair Mobility    Modified Rankin (Stroke Patients Only)       Balance                                             Pertinent Vitals/Pain Pain Assessment: 0-10 Pain Score: 7  Pain Location: R knee     Home Living Family/patient expects to be discharged to:: Unsure Living Arrangements: Children Available Help at Discharge: Family;Available 24 hours/day (son-in-law works from home, available)   Home Access: Stairs to enter Entrance Stairs-Rails: None Technical brewer of Steps: [place]: Environmental consultant - 2 wheels;Cane - single point      Prior Function Level of Independence: Independent with assistive device(s)         Comments: Pt reports she has been using [place] for (~weekly) community activities, cruises furniture in the home     Hand Dominance        Extremity/Trunk Assessment   Upper Extremity Assessment Upper Extremity Assessment: Generalized weakness    Lower Extremity Assessment Lower Extremity Assessment: Generalized weakness (expected post-op weakness, no R AROM SLRs)       Communication   Communication: No difficulties  Cognition Arousal/Alertness: Lethargic Behavior During Therapy: WFL for tasks assessed/performed Overall Cognitive Status: Within Functional Limits for tasks assessed  General Comments: pt falling sleep t/o session, daughter reports this has happened with oxy in the past      General Comments General comments (skin integrity, edema, etc.): Initially attempted to see pt shortly after 1600, she was awake, eager to participate but still being checked-in by nursing and wondering about dinner.  However, willing to work with PT; this PT came back after seeing another pt with vastly different pt status.  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    Exercises Total Joint Exercises Ankle Circles/Pumps: AROM;10 reps Quad Sets: Strengthening;10 reps Short Arc Quad: AAROM;5 reps Heel Slides: AAROM;5 reps (with resisted leg extensions) Hip ABduction/ADduction: AROM;AAROM;10 reps Straight Leg Raises: AAROM;5 reps (god effort but with signficant  pain and weakness) Knee Flexion: PROM;5 reps Goniometric ROM: 0-50   Assessment/Plan    PT Assessment Patient needs continued PT services  PT Problem List Decreased strength;Decreased range of motion;Decreased activity tolerance;Decreased mobility;Decreased balance;Decreased coordination;Decreased knowledge of use of DME;Decreased safety awareness;Pain       PT Treatment Interventions DME instruction;Gait training;Stair training;Functional mobility training;Therapeutic activities;Therapeutic exercise;Balance training;Neuromuscular re-education;Patient/family education    PT Goals (Current goals can be found in the Care Plan section)  Acute Rehab PT Goals Patient Stated Goal: go home PT Goal Formulation: With patient Time For Goal Achievement: 03/28/20 Potential to Achieve Goals: Fair    Frequency BID   Barriers to discharge        Co-evaluation               AM-PAC PT "6 Clicks" Mobility  Outcome Measure Help needed turning from your back to your side while in a flat bed without using bedrails?: A Lot Help needed moving from lying on your back to sitting on the side of a flat bed without using bedrails?: A Lot Help needed moving to and from a bed to a chair (including a wheelchair)?: A Lot Help needed standing up from a chair using your arms (e.g., wheelchair or bedside chair)?: A Lot Help needed to walk in hospital room?: A Lot Help needed climbing 3-5 steps with a railing? : A Lot 6 Click Score: 12    End of Session   Activity Tolerance: Patient limited by pain;Patient limited by lethargy Patient left: with bed alarm set;with call bell/phone within reach;with family/visitor present;with nursing/sitter in room Nurse Communication: Mobility status (h/o side effects from pain meds) PT Visit Diagnosis: Muscle weakness (generalized) (M62.81);Difficulty in walking, not elsewhere classified (R26.2);Pain Pain - Right/Left: Right Pain - part of body: Knee    Time:  0102-7253 PT Time Calculation (min) (ACUTE ONLY): 25 min   Charges:   PT Evaluation $PT Eval Low Complexity: 1 Low PT Treatments $Therapeutic Exercise: 8-22 mins        Kreg Shropshire, DPT 03/14/2020, 5:53 PM

## 2020-03-14 NOTE — Transfer of Care (Signed)
Immediate Anesthesia Transfer of Care Note  Patient: Elaine Cox  Procedure(s) Performed: COMPUTER ASSISTED TOTAL KNEE ARTHROPLASTY (Right Knee)  Patient Location: PACU  Anesthesia Type:Spinal  Level of Consciousness: awake, alert  and oriented  Airway & Oxygen Therapy: Patient Spontanous Breathing and Patient connected to face mask oxygen  Post-op Assessment: Report given to RN and Post -op Vital signs reviewed and stable  Post vital signs: Reviewed and stable  Last Vitals:  Vitals Value Taken Time  BP 129/54 03/14/20 1439  Temp    Pulse 69 03/14/20 1443  Resp 28 03/14/20 1443  SpO2 97 % 03/14/20 1443  Vitals shown include unvalidated device data.  Last Pain:  Vitals:   03/14/20 1441  TempSrc:   PainSc: (P) Asleep         Complications: No complications documented.

## 2020-03-14 NOTE — Op Note (Signed)
OPERATIVE NOTE  DATE OF SURGERY:  03/14/2020  PATIENT NAME:  Elaine Cox   DOB: 1945-08-14  MRN: [id number]  PRE-OPERATIVE DIAGNOSIS: Degenerative arthrosis of the right knee, primary  POST-OPERATIVE DIAGNOSIS:  Same  PROCEDURE:  Right total knee arthroplasty using computer-assisted navigation  SURGEON:  Marciano Sequin. M.D.  ASSISTANT: Cassell Smiles, PA-C (present and scrubbed throughout the case, critical for assistance with exposure, retraction, instrumentation, and closure)  ANESTHESIA: spinal  ESTIMATED BLOOD LOSS: 50 mL  FLUIDS REPLACED: 1100 mL of crystalloid  TOURNIQUET TIME: 84 minutes  DRAINS: 2 medium Hemovac drains  SOFT TISSUE RELEASES: Anterior cruciate ligament, posterior cruciate ligament, deep medial collateral ligament, patellofemoral ligament  IMPLANTS UTILIZED: DePuy Attune size 5N posterior stabilized femoral component (cemented), size 5 rotating platform tibial component (cemented), 35 mm medialized dome patella (cemented), and a 7 mm stabilized rotating platform polyethylene insert.  INDICATIONS FOR SURGERY: Elaine Cox is a 74 y.o. year old female with a long history of progressive knee pain. X-rays demonstrated severe degenerative changes in tricompartmental fashion. The patient had not seen any significant improvement despite conservative nonsurgical intervention. After discussion of the risks and benefits of surgical intervention, the patient expressed understanding of the risks benefits and agree with plans for total knee arthroplasty.   The risks, benefits, and alternatives were discussed at length including but not limited to the risks of infection, bleeding, nerve injury, stiffness, blood clots, the need for revision surgery, cardiopulmonary complications, among others, and they were willing to proceed.  PROCEDURE IN DETAIL: The patient was brought into the operating room and, after adequate spinal anesthesia was achieved, a tourniquet was  placed on the patient's upper thigh. The patient's knee and leg were cleaned and prepped with alcohol and DuraPrep and draped in the usual sterile fashion. A "timeout" was performed as per usual protocol. The lower extremity was exsanguinated using an Esmarch, and the tourniquet was inflated to 300 mmHg. An anterior longitudinal incision was made followed by a standard mid vastus approach. The deep fibers of the medial collateral ligament were elevated in a subperiosteal fashion off of the medial flare of the tibia so as to maintain a continuous soft tissue sleeve. The patella was subluxed laterally and the patellofemoral ligament was incised. Inspection of the knee demonstrated severe degenerative changes with full-thickness loss of articular cartilage. Osteophytes were debrided using a rongeur. Anterior and posterior cruciate ligaments were excised. Two 4.0 mm Schanz pins were inserted in the femur and into the tibia for attachment of the array of trackers used for computer-assisted navigation. Hip center was identified using a circumduction technique. Distal landmarks were mapped using the computer. The distal femur and proximal tibia were mapped using the computer. The distal femoral cutting guide was positioned using computer-assisted navigation so as to achieve a 5 distal valgus cut. The femur was sized and it was felt that a size 5N femoral component was appropriate. A size 5 femoral cutting guide was positioned and the anterior cut was performed and verified using the computer. This was followed by completion of the posterior and chamfer cuts. Femoral cutting guide for the central box was then positioned in the center box cut was performed.  Attention was then directed to the proximal tibia. Medial and lateral menisci were excised. The extramedullary tibial cutting guide was positioned using computer-assisted navigation so as to achieve a 0 varus-valgus alignment and 3 posterior slope. The cut was  performed and verified using the computer. The proximal tibia was  sized and it was felt that a size 5 tibial tray was appropriate. Tibial and femoral trials were inserted followed by insertion of a 7 mm polyethylene insert. This allowed for excellent mediolateral soft tissue balancing both in flexion and in full extension. Finally, the patella was cut and prepared so as to accommodate a 35 mm medialized dome patella. A patella trial was placed and the knee was placed through a range of motion with excellent patellar tracking appreciated. The femoral trial was removed after debridement of posterior osteophytes. The central post-hole for the tibial component was reamed followed by insertion of a keel punch. Tibial trials were then removed. Cut surfaces of bone were irrigated with copious amounts of normal saline using pulsatile lavage and then suctioned dry. Polymethylmethacrylate cement with gentamicin was prepared in the usual fashion using a vacuum mixer. Cement was applied to the cut surface of the proximal tibia as well as along the undersurface of a size 5 rotating platform tibial component. Tibial component was positioned and impacted into place. Excess cement was removed using [employer] fast streamer. Cement was then applied to the cut surfaces of the femur as well as along the posterior flanges of the size 5N femoral component. The femoral component was positioned and impacted into place. Excess cement was removed using [employer] fast streamer. A 7 mm polyethylene trial was inserted and the knee was brought into full extension with steady axial compression applied. Finally, cement was applied to the backside of a 35 mm medialized dome patella and the patellar component was positioned and patellar clamp applied. Excess cement was removed using [employer] fast streamer. After adequate curing of the cement, the tourniquet was deflated after a total tourniquet time of 84 minutes. Hemostasis was achieved using electrocautery. The knee was  irrigated with copious amounts of normal saline using pulsatile lavage followed by 500 ml of Surgiphor and then suctioned dry. 20 mL of 1.3% Exparel and 60 mL of 0.25% Marcaine in 40 mL of normal saline was injected along the posterior capsule, medial and lateral gutters, and along the arthrotomy site. A 7 mm stabilized rotating platform polyethylene insert was inserted and the knee was placed through a range of motion with excellent mediolateral soft tissue balancing appreciated and excellent patellar tracking noted. 2 medium drains were placed in the wound bed and brought out through separate stab incisions. The medial parapatellar portion of the incision was reapproximated using interrupted sutures of #1 Vicryl. Subcutaneous tissue was approximated in layers using first #0 Vicryl followed #2-0 Vicryl. The skin was approximated with skin staples. A sterile dressing was applied.  The patient tolerated the procedure well and was transported to the recovery room in stable condition.    Jaaziah Schulke P. Holley Bouche., M.D.

## 2020-03-14 NOTE — H&P (Signed)
The patient has been re-examined, and the chart reviewed, and there have been no interval changes to the documented history and physical.    The risks, benefits, and alternatives have been discussed at length. The patient expressed understanding of the risks benefits and agreed with plans for surgical intervention.  Elaine Cox, Jr. M.D.    

## 2020-03-14 NOTE — Anesthesia Procedure Notes (Signed)
Date/Time: 03/14/2020 11:14 AM Performed by: Nelda Marseille, CRNA Pre-anesthesia Checklist: Patient identified, Emergency Drugs available, Suction available, Patient being monitored and Timeout performed Oxygen Delivery Method: Simple face mask

## 2020-03-15 ENCOUNTER — Encounter: Payer: Self-pay | Admitting: Orthopedic Surgery

## 2020-03-15 LAB — GLUCOSE, CAPILLARY
Glucose-Capillary: 214 mg/dL — ABNORMAL HIGH (ref 70–99)
Glucose-Capillary: 251 mg/dL — ABNORMAL HIGH (ref 70–99)
Glucose-Capillary: 153 mg/dL — ABNORMAL HIGH (ref 70–99)
Glucose-Capillary: 174 mg/dL — ABNORMAL HIGH (ref 70–99)

## 2020-03-15 NOTE — TOC Initial Note (Signed)
Transition of Care [facility]) - Initial/Assessment Note    Patient Details  Name: Elaine Cox MRN: [id number] Date of Birth: 1945-07-22  Transition of Care [facility]) CM/SW Contact:    Shelbie Ammons, RN Phone Number: 03/15/2020, 2:12 PM  Clinical Narrative:   RNCM met with patient and daughter in room. Patient resting in bed and reports to feeling better today aside from small concern about her blood sugar being elevated. Patient reports that she is happy to know she will be going home and thinks that will be tomorrow she reports that she has already been contacted by [facility] and they are supposed to come out once she gets home. Patient reports that she is agreeable to having a 3N1 delivered as well.  RNCM reached out to [facility] with [employer] and she will deliver equipment.  RNCM reached out to [place] with [facility] and they are ready to service patient.                 Expected Discharge Plan: [place] Barriers to Discharge: No Barriers Identified   Patient Goals and CMS Choice        Expected Discharge Plan and Services Expected Discharge Plan: [place]       Living arrangements for the past 2 months: Single Family Home                 DME Arranged: 3-N-1 DME Agency: [employer] Date DME Agency Contacted: 03/15/20 Time DME Agency Contacted: [phone] Representative spoke with at DME Agency: Mardene Celeste [place]: PT,OT [place] Agency: [facility] (formerly [employer]) Date [place]: 03/15/20 Time [place]: 43 Representative spoke with at [place]: [place] Arrangements/Services Living arrangements for the past 2 months: [place] with:: Self Patient language and need for interpreter reviewed:: Yes Do you feel safe going back to the place where you live?: Yes      Need for Family Participation in Patient Care: Yes (Comment) Care giver support system in place?: Yes (comment)    Criminal Activity/Legal Involvement Pertinent to Current Situation/Hospitalization: No - Comment as needed  Activities of Daily Living Home Assistive Devices/Equipment: Cane (specify quad or straight),CBG Meter,Eyeglasses,Dentures (specify type) ADL Screening (condition at time of admission) Patient's cognitive ability adequate to safely complete daily activities?: Yes Is the patient deaf or have difficulty hearing?: No Does the patient have difficulty seeing, even when wearing glasses/contacts?: No Does the patient have difficulty concentrating, remembering, or making decisions?: Yes Patient able to express need for assistance with ADLs?: Yes Does the patient have difficulty dressing or bathing?: No Independently performs ADLs?: Yes (appropriate for developmental age) Does the patient have difficulty walking or climbing stairs?: Yes Weakness of Legs: Both Weakness of Arms/Hands: None  Permission Sought/Granted                  Emotional Assessment Appearance:: Appears stated age     Orientation: : Oriented to Self,Oriented to Place,Oriented to  Time,Oriented to Situation Alcohol / Substance Use: Not Applicable Psych Involvement: No (comment)  Admission diagnosis:  Total knee replacement status [Z96.659] Patient Active Problem List   Diagnosis Date Noted  . 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 Pelvic pressure in female 03/13/2020  . Panic anxiety syndrome 11/18/2018  . Syncope 03/07/2018  . NSTEMI (non-ST elevated myocardial infarction) ([place]) 02/22/2018  . Anxiety 02/22/2018  . Diabetes ([place]) 02/22/2018  . HTN (hypertension) 02/22/2018  .  GERD (gastroesophageal reflux disease) 02/22/2018  . CAD (coronary artery disease) 02/22/2018  . HLD (hyperlipidemia) 02/22/2018  . Unilateral primary osteoarthritis, right knee 09/23/2017  . Bursitis of right shoulder 05/25/2017  . Encounter for long-term (current) use of high-risk medication 01/24/2016  . Numbness and  tingling in left arm 10/24/2015  . 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 Elbow joint effusion, left 07/13/2015  . 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 Stroke ([place]) 03/14/2014  . Osteoarthritis 11/16/2013  . 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 Benign essential hypertension 07/02/2013  . Enthesopathy of hip region 07/02/2013  . Low back pain 07/02/2013   PCP:  Tracie Harrier, MD Pharmacy:   [facility] [address] [place] [address] [place] [address] Phone: [phone] Fax: [phone]  [id number] [id number]: [phone]: [phone]    Social Determinants of Health (SDOH) Interventions    Readmission Risk Interventions No flowsheet data found.

## 2020-03-15 NOTE — Progress Notes (Signed)
Physical Therapy Treatment Patient Details Name: Elaine Cox MRN: [id number] DOB: 1945-08-24 Today's Date: 03/15/2020    History of Present Illness 74 y/o female s/p R TKA 03/14/20.    PT Comments    Pt continues to improve POD1 and was able to ambulate >100 ft, negotiate up down 4 steps with rail (will need to do single step with walker to simulate entry into home).  She continues to do well with mobility and strength and despite expected pain has done well.  Her HR stays in the 70s t/o most of the session and O2 remains in the high 90s.  Pt doing well, eager to go home when medically clear.     Follow Up Recommendations  Home health PT     Equipment Recommendations  3in1 (PT)    Recommendations for Other Services       Precautions / Restrictions Precautions Precautions: Fall;Knee Precaution Booklet Issued: Yes (comment) Restrictions RLE Weight Bearing: Weight bearing as tolerated    Mobility  Bed Mobility Overal bed mobility: Modified Independent             General bed mobility comments: easily gets to EOB from supine w/o assist  Transfers Overall transfer level: Modified independent Equipment used: Rolling walker (2 wheeled)             General transfer comment: Pt continues to need cuing for appropriate sequencing and set up, but did not need physical assist getting up to standing  Ambulation/Gait Ambulation/Gait assistance: Supervision Gait Distance (Feet): 110 Feet Assistive device: Rolling walker (2 wheeled)       General Gait Details: Again with some minimal hesistancy intially with R WBing but quickly able to increase speed, cadence and confidence and eventually taking reciprocal steps with consistent walker momentum.  vitals stable and appropriate t/o the effort   Stairs Stairs: Yes Stairs assistance: Supervision Stair Management: Two rails;Step to pattern;Forwards Number of Stairs: 4 General stair comments: Pt able to ascend and  descend steps without assist and with good safety   Wheelchair Mobility    Modified Rankin (Stroke Patients Only)       Balance Overall balance assessment: Modified Independent   Sitting balance-Leahy Scale: Good     Standing balance support: Bilateral upper extremity supported Standing balance-Leahy Scale: Good Standing balance comment: still with rare, small-scale buckling but good ability to self arrest and no LOBs or near LOBs                            Cognition Arousal/Alertness: Awake/alert Behavior During Therapy: WFL for tasks assessed/performed Overall Cognitive Status: Within Functional Limits for tasks assessed                                        Exercises Total Joint Exercises Ankle Circles/Pumps: AROM;10 reps Quad Sets: Strengthening;15 reps Heel Slides: Strengthening;10 reps Hip ABduction/ADduction: Strengthening;10 reps Straight Leg Raises: AROM;10 reps Long Arc Quad: Strengthening;10 reps Knee Flexion: PROM;5 reps    General Comments        Pertinent Vitals/Pain Pain Assessment: 0-10 Pain Score: 6  Pain Location: R knee    Home Living                      Prior Function            PT Goals (current  goals can now be found in the care plan section) Progress towards PT goals: Not progressing toward goals - comment    Frequency    BID      PT Plan Discharge plan needs to be updated    Co-evaluation              AM-PAC PT "6 Clicks" Mobility   Outcome Measure  Help needed turning from your back to your side while in a flat bed without using bedrails?: None Help needed moving from lying on your back to sitting on the side of a flat bed without using bedrails?: A Little Help needed moving to and from a bed to a chair (including a wheelchair)?: A Little Help needed standing up from a chair using your arms (e.g., wheelchair or bedside chair)?: A Little Help needed to walk in hospital room?:  A Little Help needed climbing 3-5 steps with a railing? : A Little 6 Click Score: 19    End of Session Equipment Utilized During Treatment: Gait belt Activity Tolerance: Patient limited by pain;Patient limited by lethargy Patient left: with call bell/phone within reach;with family/visitor present;with chair alarm set   PT Visit Diagnosis: Muscle weakness (generalized) (M62.81);Difficulty in walking, not elsewhere classified (R26.2);Pain Pain - Right/Left: Right Pain - part of body: Knee     Time: 1530-1609 PT Time Calculation (min) (ACUTE ONLY): 39 min  Charges:  $Gait Training: 8-22 mins $Therapeutic Exercise: 8-22 mins $Therapeutic Activity: 8-22 mins                     Kreg Shropshire, DPT 03/15/2020, 5:37 PM

## 2020-03-15 NOTE — Progress Notes (Signed)
Physical Therapy Treatment Patient Details Name: Elaine Cox MRN: [id number] DOB: [birth year] Today's Date: 03/15/2020    History of Present Illness 74 y/o female s/p R TKA 03/14/20.    PT Comments    Pt showed great effort with AM and did well with mobility, gait, ROM and strength.  She was able to do SLRs w/o assist, had nearly 80* of flexion and was able to ambulate ~75 ft with slow but safe ambulation.  She did have some buckling that she self arrested with purposeful quad engagement and cuing from PT but no LOBs or overt safety issues.  Daughter present and pleased with the improvement from yesterday.  Good effort and doing well enough that she should be able to go home with HHPT once medically ready post surgery.  Follow Up Recommendations  Home health PT     Equipment Recommendations  3in1 (PT)    Recommendations for Other Services       Precautions / Restrictions Precautions Precautions: Fall;Knee Precaution Booklet Issued: Yes (comment) Restrictions Weight Bearing Restrictions: Yes RLE Weight Bearing: Weight bearing as tolerated    Mobility  Bed Mobility Overal bed mobility: Modified Independent             General bed mobility comments: Pt slow but determined to get to EOB w/o assist, managed to do so with rail use  Transfers Overall transfer level: Modified independent Equipment used: Rolling walker (2 wheeled)             General transfer comment: multiple bouts of rising sit to stand, VCs/reminders for UE use and set up/sequencing.  Ambulation/Gait Ambulation/Gait assistance: Min Gaffer (Feet): 75 Feet Assistive device: Rolling walker (2 wheeled)       General Gait Details: Pt intiaily with some hesitancy with R WBing (occasional small grade buckling when not purposefully engaging quad) but showed improvement and appropraite increase in speed with increased ambulation.  She had some fatigue, but O2 and HR remain  stable and appropriate t/o.  Pt reliant on walker but safe and ultimately showed great first bout of ambulation.   Stairs             Wheelchair Mobility    Modified Rankin (Stroke Patients Only)       Balance Overall balance assessment: Needs assistance Sitting-balance support: No upper extremity supported Sitting balance-Leahy Scale: Good     Standing balance support: Bilateral upper extremity supported Standing balance-Leahy Scale: Fair Standing balance comment: reliant on walker in standing, needed to pursposefully engage quad to arrest occasional buckling                            Cognition Arousal/Alertness: Awake/alert Behavior During Therapy: WFL for tasks assessed/performed Overall Cognitive Status: Within Functional Limits for tasks assessed                                 General Comments: Pt much more alert and able to particiapte this session      Exercises Total Joint Exercises Ankle Circles/Pumps: AROM;10 reps Quad Sets: Strengthening;10 reps Short Arc Quad: Strengthening;10 reps Heel Slides: AROM;AAROM;10 reps (with resisted leg extensions) Hip ABduction/ADduction: Strengthening;10 reps Straight Leg Raises: AROM;10 reps Knee Flexion: PROM;5 reps Goniometric ROM: 0-79    General Comments        Pertinent Vitals/Pain Pain Assessment: 0-10 Pain Score: 6     Home  Living Family/patient expects to be discharged to:: Private residence                    Prior Function            PT Goals (current goals can now be found in the care plan section) Progress towards PT goals: Progressing toward goals    Frequency    BID      PT Plan Discharge plan needs to be updated    Co-evaluation              AM-PAC PT "6 Clicks" Mobility   Outcome Measure  Help needed turning from your back to your side while in a flat bed without using bedrails?: None Help needed moving from lying on your back to  sitting on the side of a flat bed without using bedrails?: A Little Help needed moving to and from a bed to a chair (including a wheelchair)?: A Little Help needed standing up from a chair using your arms (e.g., wheelchair or bedside chair)?: A Little Help needed to walk in hospital room?: A Little Help needed climbing 3-5 steps with a railing? : A Little 6 Click Score: 19    End of Session Equipment Utilized During Treatment: Gait belt Activity Tolerance: Patient limited by pain;Patient limited by lethargy Patient left: with call bell/phone within reach;with family/visitor present;with chair alarm set Nurse Communication: Mobility status PT Visit Diagnosis: Muscle weakness (generalized) (M62.81);Difficulty in walking, not elsewhere classified (R26.2);Pain Pain - Right/Left: Right Pain - part of body: Knee     Time: 0921-1009 PT Time Calculation (min) (ACUTE ONLY): 48 min  Charges:  $Gait Training: 8-22 mins $Therapeutic Exercise: 8-22 mins $Therapeutic Activity: 8-22 mins                     Kreg Shropshire, DPT 03/15/2020, 12:05 PM

## 2020-03-15 NOTE — Progress Notes (Signed)
  Subjective: 1 Day Post-Op Procedure(s) (LRB): COMPUTER ASSISTED TOTAL KNEE ARTHROPLASTY (Right) Patient reports pain as well-controlled.   Patient is well, and has had no acute complaints or problems Plan is to go Home after hospital stay, assuming adequate and increased PT progress. Negative for chest pain and shortness of breath Fever: no Gastrointestinal: negative for nausea and vomiting.  Patient has not had a bowel movement.  Objective: Vital signs in last 24 hours: Temp:  [97.4 F (36.3 C)-99.5 F (37.5 C)] 98.4 F (36.9 C) (12/09 0720) Pulse Rate:  [56-74] 71 (12/09 0720) Resp:  [12-24] 16 (12/09 0720) BP: (127-159)/(54-78) 149/67 (12/09 0720) SpO2:  [95 %-99 %] 98 % (12/09 0720) Weight:  [86.7 kg] 86.7 kg (12/08 1642)  Intake/Output from previous day:  Intake/Output Summary (Last 24 hours) at 03/15/2020 0806 Last data filed at 03/15/2020 0530 Gross per 24 hour  Intake 2153.11 ml  Output 805 ml  Net 1348.11 ml    Intake/Output this shift: No intake/output data recorded.  Labs: No results for input(s): HGB in the last 72 hours. No results for input(s): WBC, RBC, HCT, PLT in the last 72 hours. No results for input(s): NA, K, CL, CO2, BUN, CREATININE, GLUCOSE, CALCIUM in the last 72 hours. No results for input(s): LABPT, INR in the last 72 hours.   EXAM General - Patient is Alert, Appropriate and Oriented Extremity - Neurovascular intact Dorsiflexion/Plantar flexion intact Compartment soft Dressing/Incision -Postoperative dressing remains in place., Polar Care in place and working. , Hemovac in place.  Motor Function - intact, moving foot and toes well on exam.  Cardiovascular- Regular rate and rhythm, no murmurs/rubs/gallops Respiratory- Lungs clear to auscultation bilaterally Gastrointestinal- soft, nontender and active bowel sounds   Assessment/Plan: 1 Day Post-Op Procedure(s) (LRB): COMPUTER ASSISTED TOTAL KNEE ARTHROPLASTY (Right) Active Problems:    Total knee replacement status  Estimated body mass index is 34.96 kg/m as calculated from the following:   Height as of this encounter: 5\' 2"  (1.575 m).   Weight as of this encounter: 86.7 kg. Advance diet Up with therapy Plan for discharge tomorrow pending adequate progress with PT.     DVT Prophylaxis - Lovenox, Ted hose and foot pumps Weight-Bearing as tolerated to right leg  Cassell Smiles, PA-C [facility] Orthopaedic Surgery 03/15/2020, 8:06 AM

## 2020-03-15 NOTE — Evaluation (Signed)
Occupational Therapy Evaluation Patient Details Name: Elaine Cox MRN: [id number] DOB: 1945/07/12 Today's Date: 03/15/2020    History of Present Illness 74 y/o female s/p R TKA 03/14/20.   Clinical Impression   Pt seen for OT evaluation this date, POD#1 from above surgery. Pt was independent in all ADL prior to surgery, however, becoming more difficult 2/2 R knee pain. Pt reports using [facility] for community mobility and holding onto furniture or walls inside the home. Pt lives with her daughter and son in law who provide assist for meals, laundry, PRN supervision for showering for safety, and transportation occasionally. Pt is eager to return to PLOF with less pain and improved safety and independence. Pt currently requires minimal assist for LB dressing and bathing while in seated position due to pain and limited AROM of R knee. Pt/dtr instructed in polar care mgt, falls prevention strategies, home/routines modifications, DME/AE for LB bathing and dressing tasks, and compression stocking mgt. Handout provided to support recall and carryover. Pt would benefit from skilled OT services including additional instruction in dressing techniques with or without assistive devices for dressing and bathing skills to support recall and carryover prior to discharge and ultimately to maximize safety, independence, and minimize falls risk and caregiver burden. Recommend [place] upon discharge.     Follow Up Recommendations  Home health OT    Equipment Recommendations  3 in 1 bedside commode;Tub/shower seat    Recommendations for Other Services       Precautions / Restrictions Precautions Precautions: Fall;Knee Precaution Booklet Issued: Yes (comment) Restrictions Weight Bearing Restrictions: Yes RLE Weight Bearing: Weight bearing as tolerated      Mobility Bed Mobility Overal bed mobility: Modified Independent             General bed mobility comments: deferred, up in recliner     Transfers Overall transfer level: Modified independent Equipment used: Rolling walker (2 wheeled)             General transfer comment: multiple bouts of rising sit to stand, VCs/reminders for UE use and set up/sequencing.    Balance Overall balance assessment: Needs assistance Sitting-balance support: No upper extremity supported Sitting balance-Leahy Scale: Good     Standing balance support: Bilateral upper extremity supported Standing balance-Leahy Scale: Fair Standing balance comment: reliant on walker in standing, needed to pursposefully engage quad to arrest occasional buckling                           ADL either performed or assessed with clinical judgement   ADL Overall ADL's : Needs assistance/impaired                                       General ADL Comments: Min A for LB ADL, Sup for ADL transfers     Vision Baseline Vision/History: Wears glasses Wears Glasses: At all times Patient Visual Report: No change from baseline       Perception     Praxis      Pertinent Vitals/Pain Pain Assessment: 0-10 Pain Score: 5  Pain Location: R knee Pain Descriptors / Indicators: Aching Pain Intervention(s): Limited activity within patient's tolerance;Monitored during session;Premedicated before session;Repositioned;Ice applied     Hand Dominance Right   Extremity/Trunk Assessment Upper Extremity Assessment Upper Extremity Assessment: Generalized weakness   Lower Extremity Assessment Lower Extremity Assessment: Generalized weakness;RLE deficits/detail RLE Deficits /  Details: s/p TKA       Communication Communication Communication: No difficulties   Cognition Arousal/Alertness: Awake/alert Behavior During Therapy: WFL for tasks assessed/performed Overall Cognitive Status: Within Functional Limits for tasks assessed                                 General Comments: Pt much more alert and able to particiapte  this session   General Comments       Exercises Exercises: Total Joint Total Joint Exercises Ankle Circles/Pumps: AROM;10 reps Quad Sets: Strengthening;10 reps Short Arc Quad: Strengthening;10 reps Heel Slides: AROM;AAROM;10 reps (with resisted leg extensions) Hip ABduction/ADduction: Strengthening;10 reps Straight Leg Raises: AROM;10 reps Knee Flexion: PROM;5 reps Goniometric ROM: 0-79 Other Exercises Other Exercises: Pt/dtr instructed in falls prevention, compression stocking mgt, polar care mgt, AE/DME, home/routines modifications, and bathroom set up   Shoulder Instructions      Home Living Family/patient expects to be discharged to:: Unsure Living Arrangements: Children Available Help at Discharge: Family;Available 24 hours/day (son-in-law works from home, available)   Home Access: Stairs to enter Technical brewer of Steps: 1 Entrance Stairs-Rails: None                 Home Equipment: Environmental consultant - 2 wheels;Cane - single point          Prior Functioning/Environment Level of Independence: Independent with assistive device(s)        Comments: Pt reports she has been using [place] for (~weekly) community activities, cruises furniture in the home        OT Problem List: Decreased strength;Pain;Decreased range of motion;Impaired balance (sitting and/or standing);Decreased knowledge of use of DME or AE      OT Treatment/Interventions: Self-care/ADL training;Therapeutic exercise;Therapeutic activities;DME and/or AE instruction;Patient/family education;Balance training    OT Goals(Current goals can be found in the care plan section) Acute Rehab OT Goals Patient Stated Goal: go home OT Goal Formulation: With patient/family Time For Goal Achievement: 03/29/20 Potential to Achieve Goals: Good ADL Goals Pt Will Perform Lower Body Dressing: with min guard assist;sit to/from stand;with adaptive equipment Pt Will Transfer to Toilet: with modified  independence;ambulating (BSC over toilet, LRAD for amb) Additional ADL Goal #1: Pt will independently instruct family/caregiver in polar care mgt Additional ADL Goal #2: Pt will independently instruct family/caregiver in compression stocking mgt  OT Frequency: Min 1X/week   Barriers to D/C:            Co-evaluation              AM-PAC OT "6 Clicks" Daily Activity     Outcome Measure Help from another person eating meals?: None Help from another person taking care of personal grooming?: None Help from another person toileting, which includes using toliet, bedpan, or urinal?: A Little Help from another person bathing (including washing, rinsing, drying)?: A Little Help from another person to put on and taking off regular upper body clothing?: None Help from another person to put on and taking off regular lower body clothing?: A Little 6 Click Score: 21   End of Session    Activity Tolerance: Patient tolerated treatment well Patient left: in chair;with call bell/phone within reach;with chair alarm set;Other (comment) (polar care in place)  OT Visit Diagnosis: Other abnormalities of gait and mobility (R26.89);Muscle weakness (generalized) (M62.81);Pain Pain - Right/Left: Right Pain - part of body: Knee  Time: 1975-8832 OT Time Calculation (min): 29 min Charges:  OT General Charges $OT Visit: 1 Visit OT Evaluation $OT Eval Low Complexity: 1 Low OT Treatments $Self Care/Home Management : 23-37 mins  Jeni Salles, MPH, MS, OTR/L ascom [phone] 03/15/20, 12:27 PM

## 2020-03-15 NOTE — Progress Notes (Signed)
Inpatient Diabetes Program Recommendations  AACE/ADA: New Consensus Statement on Inpatient Glycemic Control (2015)  Target Ranges:  Prepandial:   less than 140 mg/dL      Peak postprandial:   less than 180 mg/dL (1-2 hours)      Critically ill patients:  140 - 180 mg/dL   Lab Results  Component Value Date   GLUCAP 251 (H) 03/15/2020   HGBA1C 6.6 (H) 03/07/2020    Review of Glycemic Control Results for HANALEI, GLACE (MRN [id number]) as of 03/15/2020 12:04  Ref. Range 03/14/2020 14:59 03/14/2020 16:25 03/14/2020 21:18 03/15/2020 07:19 03/15/2020 11:50  Glucose-Capillary Latest Ref Range: 70 - 99 mg/dL 200 (H) 193 (H) 225 (H) 214 (H) 251 (H)   Diabetes history: DM 2 Outpatient Diabetes medications:  Metformin 500 mg bid Current orders for Inpatient glycemic control:  Novolog moderate tid with meals Metformin 500 mg bid Inpatient Diabetes Program Recommendations:   Note blood sugars>200 mg/dL.  Patient did receive Decadron 8 mg x 1 yesterday which likely increased CBG's.   Will follow.   Thanks  Adah Perl, RN, BC-ADM Inpatient Diabetes Coordinator Pager [phone] (8a-5p)

## 2020-03-15 NOTE — Discharge Summary (Signed)
Physician Discharge Summary  Patient ID: Elaine Cox MRN: [id number] DOB/AGE: Aug 10, 1945 74 y.o.  Admit date: 03/14/2020 Discharge date: 03/16/2020  Admission Diagnoses:  Total knee replacement status [Z96.659]  Surgeries:Procedure(s): Right total knee arthroplasty using computer-assisted navigation  SURGEON:  Marciano Sequin. M.D.  ASSISTANT: Cassell Smiles, PA-C (present and scrubbed throughout the case, critical for assistance with exposure, retraction, instrumentation, and closure)  ANESTHESIA: spinal  ESTIMATED BLOOD LOSS: 50 mL  FLUIDS REPLACED: 1100 mL of crystalloid  TOURNIQUET TIME: 84 minutes  DRAINS: 2 medium Hemovac drains  SOFT TISSUE RELEASES: Anterior cruciate ligament, posterior cruciate ligament, deep medial collateral ligament, patellofemoral ligament  IMPLANTS UTILIZED: DePuy Attune size 5N posterior stabilized femoral component (cemented), size 5 rotating platform tibial component (cemented), 35 mm medialized dome patella (cemented), and a 7 mm stabilized rotating platform polyethylene insert.  Discharge Diagnoses: Patient Active Problem List   Diagnosis Date Noted  . 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 Pelvic pressure in female 03/13/2020  . Panic anxiety syndrome 11/18/2018  . Syncope 03/07/2018  . NSTEMI (non-ST elevated myocardial infarction) ([place]) 02/22/2018  . Anxiety 02/22/2018  . Diabetes ([place]) 02/22/2018  . HTN (hypertension) 02/22/2018  . GERD (gastroesophageal reflux disease) 02/22/2018  . CAD (coronary artery disease) 02/22/2018  . HLD (hyperlipidemia) 02/22/2018  . Unilateral primary osteoarthritis, right knee 09/23/2017  . Bursitis of right shoulder 05/25/2017  . Encounter for long-term (current) use of high-risk medication 01/24/2016  . Numbness and tingling in left arm 10/24/2015  . 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 Elbow joint effusion, left 07/13/2015  . 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 Stroke ([place]) 03/14/2014  . Osteoarthritis 11/16/2013  . 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 Benign essential hypertension 07/02/2013  . Enthesopathy of hip region 07/02/2013  . Low back pain 07/02/2013    Past Medical History:  Diagnosis Date  . Anginal pain ([place]) 06/2016   being followed by dr. Ubaldo Glassing  . Anxiety   . Arthritis    Osteoarthritis  . Arthritis    Rheumatoid  . Breast cancer ([place]) 2000   Right Breast - Chemotherapy  . 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  . Coronary artery disease   . Diabetes mellitus without complication [facility])    Patient takes Metformin  . Dyspnea 03/2017  . GERD (gastroesophageal reflux disease)   . History of abdominal hysterectomy   . History of eyelid surgery   . History of kidney stones   . Hyperlipidemia   . Hypertension   . Lumbago   . Murmur   . Myocardial infarction ([place]) 07/2010  . Orthopnea 03/2017  . Personal history of chemotherapy 2000   right breast ca  . Sinus disorder   . Stroke [facility]) 2006, 2012  . Wears dentures    full upper and lower     Transfusion:    Consultants (if any):   Discharged Condition: Improved  Hospital Course: Elaine Cox is an 74 y.o. female who was admitted 03/14/2020 with a diagnosis of right knee osteoarthritis and went to the operating room on 03/14/2020 and underwent right total knee arthroplasty. The patient received perioperative antibiotics for prophylaxis (see below). The patient tolerated the procedure well and was transported to PACU in stable condition. After meeting PACU criteria, the patient was subsequently transferred to the Orthopaedics/Rehabilitation unit.   The patient received DVT prophylaxis in the form of early mobilization, Lovenox, Foot Pumps and TED hose.  A sacral pad had been placed and heels were elevated off of the bed with rolled towels in order to protect skin integrity. Foley catheter was  discontinued on postoperative day #0. Wound drains were discontinued on postoperative day #2. The surgical incision was healing well without signs of infection.  Physical therapy was initiated postoperatively for transfers, gait training, and strengthening. Occupational therapy was initiated for activities of daily living and evaluation for assisted devices. Rehabilitation goals were reviewed in detail with the patient. The patient made steady progress with physical therapy and physical therapy recommended discharge to Home.   The patient achieved the preliminary goals of this hospitalization and was felt to be medically and orthopaedically appropriate for discharge.  She was given perioperative antibiotics:  Anti-infectives (From admission, onward)   Start     Dose/Rate Route Frequency Ordered Stop   03/14/20 1800  ceFAZolin (ANCEF) IVPB 2g/100 mL premix        2 g 200 mL/hr over 30 Minutes Intravenous Every 6 hours 03/14/20 1540 03/15/20 0434   03/14/20 1000  ceFAZolin (ANCEF) IVPB 2g/100 mL premix        2 g 200 mL/hr over 30 Minutes Intravenous On call to O.R. 03/14/20 0948 03/14/20 1135   03/14/20 0926  ceFAZolin (ANCEF) 2-4 GM/100ML-% IVPB       Note to Pharmacy: Milinda Cave   : cabinet override      03/14/20 0926 03/14/20 1127    .  Recent vital signs:  Vitals:   03/16/20 0834 03/16/20 1145  BP: (!) 152/85 120/63  Pulse: 67 60  Resp: 17 16  Temp: 98 F (36.7 C) 98.5 F (36.9 C)  SpO2: 97% 95%    Recent laboratory studies:  No results for input(s): WBC, HGB, HCT, PLT, K, CL, CO2, BUN, CREATININE, GLUCOSE, CALCIUM, LABPT, INR in the last 72 hours.  Diagnostic Studies: DG Knee Right Port  Result Date: 03/14/2020 CLINICAL DATA:  Total knee arthroplasty.  Postop. EXAM: PORTABLE RIGHT KNEE - 1-2 VIEW COMPARISON:  03/08/2018 femur radiographs. FINDINGS: Total knee arthroplasty. Drain within the joint. Surgical staples anteriorly. No hardware complication. IMPRESSION:  Expected appearance after right knee arthroplasty. Electronically Signed   By: Abigail Miyamoto M.D.   On: 03/14/2020 15:48    Discharge Medications:   Allergies as of 03/16/2020      Reactions   Doxazosin Other (See Comments)   "headache"   Fish Allergy Anaphylaxis, Itching, Nausea Only, Swelling   She states her tongue turns black.   Dicloxacillin Nausea And Vomiting   Has patient had a PCN reaction causing immediate rash, facial/tongue/throat swelling, SOB or lightheadedness with hypotension: No Has patient had a PCN reaction causing severe rash involving mucus membranes or skin necrosis: No Has patient had a PCN reaction that required hospitalization: Unknown Has patient had a PCN reaction occurring within the last 10 years: Unknown If all of the above answers are "NO", then may proceed with Cephalosporin use.   Iodine Hives   Per patient, she had a previous reaction/hives from an iodine injection   Sulfa Antibiotics Swelling   Loss of appetite   Constipation bloating Other reaction(s): Headache   Beta Adrenergic Blockers Other (See Comments)   "Unknown"   Cardura [doxazosin Mesylate]    Headache    Etodolac Itching, Swelling   Sulfamethoxazole-trimethoprim Nausea Only   Azithromycin Itching   Ciprofloxacin Nausea Only, Rash   Clopidogrel Nausea And Vomiting   Colchicine Nausea Only, Other (See Comments)   Dizzness  Glucosamine Nausea And Vomiting, Other (See Comments)   Leflunomide Diarrhea, Itching   Meloxicam Itching   Methotrexate Nausea Only   Zoloft [sertraline] Nausea And Vomiting      Medication List    STOP taking these medications   aspirin EC 81 MG tablet     TAKE these medications   Accu-Chek Guide test strip Generic drug: glucose blood   acetaminophen 325 MG tablet Commonly known as: TYLENOL Take 2 tablets (650 mg total) by mouth every 6 (six) hours as needed for mild pain (or Fever >/= 101).   ALPRAZolam 0.25 MG tablet Commonly known as:  XANAX Take 0.25 mg by mouth at bedtime.   amLODipine 2.5 MG tablet Commonly known as: NORVASC Take 2.5 mg by mouth daily.   APPLE CIDER VINEGAR PO Take 2 tablets by mouth daily.   B-D SINGLE USE SWABS REGULAR Pads   Biotin 800 MCG Tabs Take 800 mcg by mouth daily.   bisacodyl 5 MG EC tablet Commonly known as: DULCOLAX Take 5 mg by mouth daily.   celecoxib 200 MG capsule Commonly known as: CELEBREX Take 1 capsule (200 mg total) by mouth 2 (two) times daily.   enoxaparin 40 MG/0.4ML injection Commonly known as: LOVENOX Inject 0.4 mLs (40 mg total) into the skin daily for 14 days.   GlucoCom Blood Glucose Monitor Devi by XX route once daily   hydrOXYzine 10 MG tablet Commonly known as: ATARAX/VISTARIL Take 10 mg by mouth daily.   metFORMIN 500 MG tablet Commonly known as: GLUCOPHAGE Take 500 mg by mouth 2 (two) times daily with a meal.   metoprolol succinate 100 MG 24 hr tablet Commonly known as: TOPROL-XL Take 100 mg by mouth daily.   MURINE TEARS FOR DRY EYES OP Apply 1 drop to eye daily as needed (dry eyes).   Muscle Rub 10-15 % Crea Apply 1 application topically as needed for muscle pain.   nitroGLYCERIN 0.4 MG SL tablet Commonly known as: NITROSTAT Place 0.4 mg under the tongue every 5 (five) minutes as needed for chest pain.   omeprazole 20 MG capsule Commonly known as: PRILOSEC Take 20 mg by mouth daily.   oxyCODONE 5 MG immediate release tablet Commonly known as: Oxy IR/ROXICODONE Take 1 tablet (5 mg total) by mouth every 4 (four) hours as needed for moderate pain (pain score 4-6).   Potassium Chloride ER 20 MEQ Tbcr Take 20 mEq by mouth daily.   rosuvastatin 5 MG tablet Commonly known as: CRESTOR Take 5 mg by mouth every Monday, Wednesday, and Friday.   traMADol 50 MG tablet Commonly known as: ULTRAM Take 1 tablet (50 mg total) by mouth every 4 (four) hours as needed for moderate pain.   True Metrix Level 1 Low Soln   Xeljanz 5 MG  Tabs Generic drug: Tofacitinib Citrate Take 5 mg by mouth 2 (two) times daily.            Durable Medical Equipment  (From admission, onward)         Start     Ordered   03/14/20 1541  DME Walker rolling  Once       Question:  Patient needs a walker to treat with the following condition  Answer:  Total knee replacement status   03/14/20 1540   03/14/20 1541  DME Bedside commode  Once       Question:  Patient needs a bedside commode to treat with the following condition  Answer:  Total knee replacement status  03/14/20 1540          Disposition:  home with home health PT     Follow-up Information    [facility] On 03/29/2020.   Specialty: Orthopedic Surgery Why: at 10:45am Contact information: [place] [address] [phone]        Dereck Leep, MD On 04/26/2020.   Specialty: Orthopedic Surgery Why: at 2:15pm Contact information: [place] [address] [place], PA-C 03/16/2020, 12:32 PM

## 2020-03-15 NOTE — Anesthesia Postprocedure Evaluation (Signed)
Anesthesia Post Note  Patient: Elaine Cox  Procedure(s) Performed: COMPUTER ASSISTED TOTAL KNEE ARTHROPLASTY (Right Knee)  Patient location during evaluation: Nursing Unit Anesthesia Type: Spinal Level of consciousness: awake, awake and alert, oriented and patient cooperative Pain management: pain level not controlled Vital Signs Assessment: post-procedure vital signs reviewed and stable Respiratory status: spontaneous breathing, nonlabored ventilation and respiratory function stable Cardiovascular status: stable Postop Assessment: no headache, no backache, no apparent nausea or vomiting, patient able to bend at knees and adequate PO intake Anesthetic complications: no   No complications documented.   Last Vitals:  Vitals:   03/15/20 0550 03/15/20 0720  BP: 127/63 (!) 149/67  Pulse: 74 71  Resp: 17 16  Temp: 37.1 C 36.9 C  SpO2: 97% 98%    Last Pain:  Vitals:   03/15/20 0550  TempSrc: Oral  PainSc:                  Ricki Miller

## 2020-03-16 LAB — GLUCOSE, CAPILLARY
Glucose-Capillary: 142 mg/dL — ABNORMAL HIGH (ref 70–99)
Glucose-Capillary: 153 mg/dL — ABNORMAL HIGH (ref 70–99)

## 2020-03-16 MED ORDER — TRAMADOL HCL 50 MG PO TABS: 50.0000 mg | ORAL_TABLET | ORAL | 0 refills | Status: DC | PRN

## 2020-03-16 MED ORDER — CELECOXIB 200 MG PO CAPS: 200.0000 mg | ORAL_CAPSULE | Freq: Two times a day (BID) | ORAL | 0 refills | Status: DC

## 2020-03-16 MED ORDER — ENOXAPARIN SODIUM 40 MG/0.4ML ~~LOC~~ SOLN: 40.0000 mg | SUBCUTANEOUS | 0 refills | Status: DC

## 2020-03-16 MED ORDER — OXYCODONE HCL 5 MG PO TABS: 5.0000 mg | ORAL_TABLET | ORAL | 0 refills | Status: DC | PRN

## 2020-03-16 NOTE — Progress Notes (Signed)
Physical Therapy Treatment Patient Details Name: Elaine Cox MRN: [id number] DOB: [birth year] Today's Date: 03/16/2020    History of Present Illness 74 y/o female s/p R TKA 03/14/20.    PT Comments    Pt received standing in room with daughter present & assisting with hygiene at sink. Pt pleasant & eager to d/c home today. Pt ambulates around nurses station with RW & supervision with 2 standing rest breaks 2/2 fatigue. PT provides education & demo re: single step negotiation with pt completing task with min assist with RW. Pt notes dizziness with stair negotiation but states it's due to height (BP = 149/68 mmHg LUE (sitting in recliner after), HR = 66 bpm, SPO2 = 97%). Will continue to follow pt acutely to focus on gait with LRAD, R knee ROM & strengthening.      Follow Up Recommendations  Home health PT     Equipment Recommendations  3in1 (PT)    Recommendations for Other Services       Precautions / Restrictions Precautions Precautions: Fall;Knee Restrictions Weight Bearing Restrictions: Yes RLE Weight Bearing: Weight bearing as tolerated    Mobility  Bed Mobility               General bed mobility comments: pt received standing in room with daughter present  Transfers Overall transfer level: Modified independent Equipment used: Rolling walker (2 wheeled)             General transfer comment: occasional cuing for safe hand placement  Ambulation/Gait Ambulation/Gait assistance: Supervision Gait Distance (Feet): 250 Feet Assistive device: Rolling walker (2 wheeled) Gait Pattern/deviations: Decreased stance time - right;Decreased weight shift to right Gait velocity: decreased   General Gait Details: decreased knee flexion, decreased heel strike RLE   Stairs Stairs: Yes Stairs assistance: Min assist Stair Management: No rails;With walker Number of Stairs: 1 (6") General stair comments: Pt negotiates 6" curb step with RW & min assist with demo &  instructional cuing for compensatory technique.   Wheelchair Mobility    Modified Rankin (Stroke Patients Only)       Balance Overall balance assessment: Modified Independent Sitting-balance support: No upper extremity supported Sitting balance-Leahy Scale: Good     Standing balance support: Bilateral upper extremity supported Standing balance-Leahy Scale: Good Standing balance comment: BUE support on RW during gait                            Cognition Arousal/Alertness: Awake/alert Behavior During Therapy: WFL for tasks assessed/performed Overall Cognitive Status: Within Functional Limits for tasks assessed                                        Exercises Total Joint Exercises Goniometric ROM: 5 degrees-82 degrees (5 degrees from full extension when performing quad sets, 82 degrees knee flexion when performing heel slides with RLE on towel on floor)    General Comments        Pertinent Vitals/Pain Pain Assessment: 0-10 Pain Score: 5  Pain Location: R knee Pain Descriptors / Indicators: Aching;Sore ("grabbing") Pain Intervention(s): Monitored during session;Limited activity within patient's tolerance;Patient requesting pain meds-RN notified    Home Living                      Prior Function            PT  Goals (current goals can now be found in the care plan section) Acute Rehab PT Goals Patient Stated Goal: go home PT Goal Formulation: With patient Time For Goal Achievement: 03/28/20 Potential to Achieve Goals: Good Progress towards PT goals: Progressing toward goals    Frequency    BID      PT Plan Current plan remains appropriate    Co-evaluation              AM-PAC PT "6 Clicks" Mobility   Outcome Measure  Help needed turning from your back to your side while in a flat bed without using bedrails?: None Help needed moving from lying on your back to sitting on the side of a flat bed without using  bedrails?: None Help needed moving to and from a bed to a chair (including a wheelchair)?: None Help needed standing up from a chair using your arms (e.g., wheelchair or bedside chair)?: None Help needed to walk in hospital room?: A Little Help needed climbing 3-5 steps with a railing? : A Little 6 Click Score: 22    End of Session Equipment Utilized During Treatment: Gait belt Activity Tolerance: Patient tolerated treatment well Patient left: in chair;with family/visitor present (with polar care donned R knee, daughter reports she will don foot pumps after assisting pt with changing socks) Nurse Communication: Mobility status PT Visit Diagnosis: Muscle weakness (generalized) (M62.81);Difficulty in walking, not elsewhere classified (R26.2);Pain Pain - Right/Left: Right Pain - part of body: Knee     Time: 1031-5945 PT Time Calculation (min) (ACUTE ONLY): 31 min  Charges:  $Gait Training: 8-22 mins $Therapeutic Activity: 8-22 mins                     Lavone Nian, PT, DPT 03/16/20, 11:05 AM    Waunita Schooner 03/16/2020, 11:04 AM

## 2020-03-16 NOTE — Progress Notes (Signed)
  Subjective: 2 Days Post-Op Procedure(s) (LRB): COMPUTER ASSISTED TOTAL KNEE ARTHROPLASTY (Right) Patient reports pain as well-controlled.   Patient is well, and has had no acute complaints or problems Plan is to go Home after hospital stay. Negative for chest pain and shortness of breath Fever: no Gastrointestinal: negative for nausea and vomiting.  Patient has had a bowel movement.  Objective: Vital signs in last 24 hours: Temp:  [98.2 F (36.8 C)-98.7 F (37.1 C)] 98.2 F (36.8 C) (12/10 0446) Pulse Rate:  [62-70] 63 (12/10 0446) Resp:  [15-16] 16 (12/10 0446) BP: (155-173)/(66-73) 161/73 (12/10 0446) SpO2:  [94 %-98 %] 97 % (12/10 0446)  Intake/Output from previous day:  Intake/Output Summary (Last 24 hours) at 03/16/2020 0811 Last data filed at 03/15/2020 1900 Gross per 24 hour  Intake 240 ml  Output 30 ml  Net 210 ml    Intake/Output this shift: No intake/output data recorded.  Labs: No results for input(s): HGB in the last 72 hours. No results for input(s): WBC, RBC, HCT, PLT in the last 72 hours. No results for input(s): NA, K, CL, CO2, BUN, CREATININE, GLUCOSE, CALCIUM in the last 72 hours. No results for input(s): LABPT, INR in the last 72 hours.   EXAM General - Patient is Alert, Appropriate and Oriented Extremity - Neurovascular intact Dorsiflexion/Plantar flexion intact Compartment soft Dressing/Incision -clean, dry, Postoperative dressing remains in place., Polar Care in place and working. , Hemovac in place. , following dressing removal, minimal sanguinous drainage noted  Motor Function - intact, moving foot and toes well on exam.  Cardiovascular- Regular rate and rhythm, no murmurs/rubs/gallops Respiratory- Lungs clear to auscultation bilaterally Gastrointestinal- soft and nontender   Assessment/Plan: 2 Days Post-Op Procedure(s) (LRB): COMPUTER ASSISTED TOTAL KNEE ARTHROPLASTY (Right) Active Problems:   Total knee replacement status  Estimated  body mass index is 34.96 kg/m as calculated from the following:   Height as of this encounter: 5\' 2"  (1.575 m).   Weight as of this encounter: 86.7 kg. Advance diet Discharge home with home health pending completion of PT goals.   Post op dressing removed. Hemovac removed. Mini compression dressing applied.  DVT Prophylaxis - Lovenox, Ted hose and foot pumps Weight-Bearing as tolerated to right leg  Cassell Smiles, PA-C [facility] 03/16/2020, 8:11 AM

## 2020-03-17 DIAGNOSIS — E1136 Type 2 diabetes mellitus with diabetic cataract: Secondary | ICD-10-CM | POA: Diagnosis not present

## 2020-03-17 DIAGNOSIS — Z96651 Presence of right artificial knee joint: Secondary | ICD-10-CM | POA: Diagnosis not present

## 2020-03-17 DIAGNOSIS — F419 Anxiety disorder, unspecified: Secondary | ICD-10-CM | POA: Diagnosis not present

## 2020-03-17 DIAGNOSIS — M059 Rheumatoid arthritis with rheumatoid factor, unspecified: Secondary | ICD-10-CM | POA: Diagnosis not present

## 2020-03-17 DIAGNOSIS — Z853 Personal history of malignant neoplasm of breast: Secondary | ICD-10-CM | POA: Diagnosis not present

## 2020-03-17 DIAGNOSIS — Z96659 Presence of unspecified artificial knee joint: Secondary | ICD-10-CM | POA: Diagnosis not present

## 2020-03-17 DIAGNOSIS — E785 Hyperlipidemia, unspecified: Secondary | ICD-10-CM | POA: Diagnosis not present

## 2020-03-17 DIAGNOSIS — I119 Hypertensive heart disease without heart failure: Secondary | ICD-10-CM | POA: Diagnosis not present

## 2020-03-17 DIAGNOSIS — Z471 Aftercare following joint replacement surgery: Secondary | ICD-10-CM | POA: Diagnosis not present

## 2020-03-17 DIAGNOSIS — I252 Old myocardial infarction: Secondary | ICD-10-CM | POA: Diagnosis not present

## 2020-03-17 DIAGNOSIS — R531 Weakness: Secondary | ICD-10-CM | POA: Diagnosis not present

## 2020-03-19 ENCOUNTER — Other Ambulatory Visit: Payer: Self-pay

## 2020-03-19 NOTE — Patient Outreach (Signed)
[place] [facility]) Care Management  03/19/2020  Elaine Cox Feb 01, 1946 [id number]    EMMI-General Discharge RED ON EMMI ALERT Day # 1 Date: 03/18/2020 Red Alert Reason:"Transportation to follow-up? No"   Outreach attempt # 1 to patient. Spoke with patient who denies any acute issues or concerns at present. She voices that she is doing fairly well.Reviewed and addressed red alert. Patient reports she does not think she will have transportation to get to surgeon follow up appt on 03/29/20. RN CM reviewed [employer] transportation benefit. Patient states she recalls hearing about it but has never taken advantage of it. RN CM provided her with contact info to call and arrange for transport. She will follow up wit them today. If she is unable to get transportation through their services patient instructed to call RN CM back. She verbalized understanding.      Plan: RN CM will close case at this time.   Enzo Montgomery, RN,BSN,CCM [facility] Telephonic Care Management Coordinator Direct Phone: [phone] Toll Free: [phone] Fax: [phone]

## 2020-03-20 DIAGNOSIS — E1136 Type 2 diabetes mellitus with diabetic cataract: Secondary | ICD-10-CM | POA: Diagnosis not present

## 2020-03-20 DIAGNOSIS — F419 Anxiety disorder, unspecified: Secondary | ICD-10-CM | POA: Diagnosis not present

## 2020-03-20 DIAGNOSIS — I252 Old myocardial infarction: Secondary | ICD-10-CM | POA: Diagnosis not present

## 2020-03-20 DIAGNOSIS — Z96651 Presence of right artificial knee joint: Secondary | ICD-10-CM | POA: Diagnosis not present

## 2020-03-20 DIAGNOSIS — I119 Hypertensive heart disease without heart failure: Secondary | ICD-10-CM | POA: Diagnosis not present

## 2020-03-20 DIAGNOSIS — M059 Rheumatoid arthritis with rheumatoid factor, unspecified: Secondary | ICD-10-CM | POA: Diagnosis not present

## 2020-03-20 DIAGNOSIS — Z471 Aftercare following joint replacement surgery: Secondary | ICD-10-CM | POA: Diagnosis not present

## 2020-03-20 DIAGNOSIS — Z853 Personal history of malignant neoplasm of breast: Secondary | ICD-10-CM | POA: Diagnosis not present

## 2020-03-20 DIAGNOSIS — E785 Hyperlipidemia, unspecified: Secondary | ICD-10-CM | POA: Diagnosis not present

## 2020-03-22 ENCOUNTER — Other Ambulatory Visit: Payer: Self-pay

## 2020-03-22 DIAGNOSIS — I119 Hypertensive heart disease without heart failure: Secondary | ICD-10-CM | POA: Diagnosis not present

## 2020-03-22 DIAGNOSIS — Z96651 Presence of right artificial knee joint: Secondary | ICD-10-CM | POA: Diagnosis not present

## 2020-03-22 DIAGNOSIS — M059 Rheumatoid arthritis with rheumatoid factor, unspecified: Secondary | ICD-10-CM | POA: Diagnosis not present

## 2020-03-22 DIAGNOSIS — I252 Old myocardial infarction: Secondary | ICD-10-CM | POA: Diagnosis not present

## 2020-03-22 DIAGNOSIS — E1136 Type 2 diabetes mellitus with diabetic cataract: Secondary | ICD-10-CM | POA: Diagnosis not present

## 2020-03-22 DIAGNOSIS — E785 Hyperlipidemia, unspecified: Secondary | ICD-10-CM | POA: Diagnosis not present

## 2020-03-22 DIAGNOSIS — Z471 Aftercare following joint replacement surgery: Secondary | ICD-10-CM | POA: Diagnosis not present

## 2020-03-22 DIAGNOSIS — F419 Anxiety disorder, unspecified: Secondary | ICD-10-CM | POA: Diagnosis not present

## 2020-03-22 DIAGNOSIS — Z853 Personal history of malignant neoplasm of breast: Secondary | ICD-10-CM | POA: Diagnosis not present

## 2020-03-22 NOTE — Patient Outreach (Signed)
[place] [facility]) Care Management  03/22/2020  JOURNIE HOWSON [birth year] [id number]   EMMI-General Discharge RED ON EMMI ALERT Day # 4 Date: 03/21/2020 Red Alert Reason: "Lost interest in things? Yes Sad/hopeless/anxious/empty? Yes"   Incoming call from patient returning RN CM call. Spoke with patient who denies any acute issues or concerns at present. Reviewed and addressed red alerts. Patient voices some typical frustration with her health and trying to recover from surgery. She reports it has been difficult for her to have to rely on her daughter to assist her more than usual. She states this has caused her to feel somewhat "down and bothered." However, she denies any sxs of depression. She has strong support sytem to assist her. She voices that she feels like she is making progress but knows it will take time to return to baseline. She denies any RN CM  needs or concerns at this time. She has completed post discharge automated calls and was appreciative of follow up call.     Plan: RN CM will close case at this time.  Enzo Montgomery, RN,BSN,CCM [facility] Telephonic Care Management Coordinator Direct Phone: [phone] Toll Free: [phone] Fax: [phone]

## 2020-03-22 NOTE — Patient Outreach (Signed)
[place] [facility]) Care Management  03/22/2020  Elaine Cox December 10, 1945 [id number]    EMMI-General Discharge RED ON EMMI ALERT Day # 4 Date: 03/21/2020 Red Alert Reason: "Lost interest in things? Yes Sad/hopeless/anxious/empty? Yes"     Outreach attempt #1 to patient. No answer. RN CM left HIPAA compliant voicemail message along with contact info.     Plan: RN CM will make outreach attempt to patient within 3-4 business days. RN CM will send unsuccessful outreach letter to patient.  Enzo Montgomery, RN,BSN,CCM [place] [facility] Telephonic Care Management Coordinator Direct Phone: [phone] Toll Free: [phone] Fax: [phone]

## 2020-03-23 DIAGNOSIS — F419 Anxiety disorder, unspecified: Secondary | ICD-10-CM | POA: Diagnosis not present

## 2020-03-23 DIAGNOSIS — Z853 Personal history of malignant neoplasm of breast: Secondary | ICD-10-CM | POA: Diagnosis not present

## 2020-03-23 DIAGNOSIS — M059 Rheumatoid arthritis with rheumatoid factor, unspecified: Secondary | ICD-10-CM | POA: Diagnosis not present

## 2020-03-23 DIAGNOSIS — Z96651 Presence of right artificial knee joint: Secondary | ICD-10-CM | POA: Diagnosis not present

## 2020-03-23 DIAGNOSIS — E1136 Type 2 diabetes mellitus with diabetic cataract: Secondary | ICD-10-CM | POA: Diagnosis not present

## 2020-03-23 DIAGNOSIS — E785 Hyperlipidemia, unspecified: Secondary | ICD-10-CM | POA: Diagnosis not present

## 2020-03-23 DIAGNOSIS — I119 Hypertensive heart disease without heart failure: Secondary | ICD-10-CM | POA: Diagnosis not present

## 2020-03-23 DIAGNOSIS — Z471 Aftercare following joint replacement surgery: Secondary | ICD-10-CM | POA: Diagnosis not present

## 2020-03-23 DIAGNOSIS — I252 Old myocardial infarction: Secondary | ICD-10-CM | POA: Diagnosis not present

## 2020-03-26 DIAGNOSIS — F419 Anxiety disorder, unspecified: Secondary | ICD-10-CM | POA: Diagnosis not present

## 2020-03-26 DIAGNOSIS — I252 Old myocardial infarction: Secondary | ICD-10-CM | POA: Diagnosis not present

## 2020-03-26 DIAGNOSIS — I119 Hypertensive heart disease without heart failure: Secondary | ICD-10-CM | POA: Diagnosis not present

## 2020-03-26 DIAGNOSIS — Z853 Personal history of malignant neoplasm of breast: Secondary | ICD-10-CM | POA: Diagnosis not present

## 2020-03-26 DIAGNOSIS — E1136 Type 2 diabetes mellitus with diabetic cataract: Secondary | ICD-10-CM | POA: Diagnosis not present

## 2020-03-26 DIAGNOSIS — Z96651 Presence of right artificial knee joint: Secondary | ICD-10-CM | POA: Diagnosis not present

## 2020-03-26 DIAGNOSIS — Z471 Aftercare following joint replacement surgery: Secondary | ICD-10-CM | POA: Diagnosis not present

## 2020-03-26 DIAGNOSIS — E785 Hyperlipidemia, unspecified: Secondary | ICD-10-CM | POA: Diagnosis not present

## 2020-03-26 DIAGNOSIS — M059 Rheumatoid arthritis with rheumatoid factor, unspecified: Secondary | ICD-10-CM | POA: Diagnosis not present

## 2020-03-27 DIAGNOSIS — E1136 Type 2 diabetes mellitus with diabetic cataract: Secondary | ICD-10-CM | POA: Diagnosis not present

## 2020-03-27 DIAGNOSIS — F419 Anxiety disorder, unspecified: Secondary | ICD-10-CM | POA: Diagnosis not present

## 2020-03-27 DIAGNOSIS — I252 Old myocardial infarction: Secondary | ICD-10-CM | POA: Diagnosis not present

## 2020-03-27 DIAGNOSIS — Z96651 Presence of right artificial knee joint: Secondary | ICD-10-CM | POA: Diagnosis not present

## 2020-03-27 DIAGNOSIS — Z471 Aftercare following joint replacement surgery: Secondary | ICD-10-CM | POA: Diagnosis not present

## 2020-03-27 DIAGNOSIS — M059 Rheumatoid arthritis with rheumatoid factor, unspecified: Secondary | ICD-10-CM | POA: Diagnosis not present

## 2020-03-27 DIAGNOSIS — E785 Hyperlipidemia, unspecified: Secondary | ICD-10-CM | POA: Diagnosis not present

## 2020-03-27 DIAGNOSIS — I119 Hypertensive heart disease without heart failure: Secondary | ICD-10-CM | POA: Diagnosis not present

## 2020-03-27 DIAGNOSIS — Z853 Personal history of malignant neoplasm of breast: Secondary | ICD-10-CM | POA: Diagnosis not present

## 2020-03-29 DIAGNOSIS — M25661 Stiffness of right knee, not elsewhere classified: Secondary | ICD-10-CM | POA: Diagnosis not present

## 2020-03-29 DIAGNOSIS — Z96651 Presence of right artificial knee joint: Secondary | ICD-10-CM | POA: Diagnosis not present

## 2020-03-29 DIAGNOSIS — M25561 Pain in right knee: Secondary | ICD-10-CM | POA: Diagnosis not present

## 2020-03-29 DIAGNOSIS — M6281 Muscle weakness (generalized): Secondary | ICD-10-CM | POA: Diagnosis not present

## 2020-04-03 DIAGNOSIS — M25561 Pain in right knee: Secondary | ICD-10-CM | POA: Diagnosis not present

## 2020-04-03 DIAGNOSIS — Z96651 Presence of right artificial knee joint: Secondary | ICD-10-CM | POA: Diagnosis not present

## 2020-04-05 DIAGNOSIS — Z96651 Presence of right artificial knee joint: Secondary | ICD-10-CM | POA: Diagnosis not present

## 2020-04-05 DIAGNOSIS — M25561 Pain in right knee: Secondary | ICD-10-CM | POA: Diagnosis not present

## 2020-04-07 DIAGNOSIS — Z471 Aftercare following joint replacement surgery: Secondary | ICD-10-CM | POA: Diagnosis not present

## 2020-04-10 DIAGNOSIS — E7849 Other hyperlipidemia: Secondary | ICD-10-CM | POA: Diagnosis not present

## 2020-04-10 DIAGNOSIS — I10 Essential (primary) hypertension: Secondary | ICD-10-CM | POA: Diagnosis not present

## 2020-04-10 DIAGNOSIS — I214 Non-ST elevation (NSTEMI) myocardial infarction: Secondary | ICD-10-CM | POA: Diagnosis not present

## 2020-04-10 DIAGNOSIS — E1165 Type 2 diabetes mellitus with hyperglycemia: Secondary | ICD-10-CM | POA: Diagnosis not present

## 2020-04-10 DIAGNOSIS — I2510 Atherosclerotic heart disease of native coronary artery without angina pectoris: Secondary | ICD-10-CM | POA: Diagnosis not present

## 2020-04-10 DIAGNOSIS — I6310 Cerebral infarction due to embolism of unspecified precerebral artery: Secondary | ICD-10-CM | POA: Diagnosis not present

## 2020-04-11 DIAGNOSIS — M25561 Pain in right knee: Secondary | ICD-10-CM | POA: Diagnosis not present

## 2020-04-11 DIAGNOSIS — Z96651 Presence of right artificial knee joint: Secondary | ICD-10-CM | POA: Diagnosis not present

## 2020-04-17 DIAGNOSIS — J069 Acute upper respiratory infection, unspecified: Secondary | ICD-10-CM | POA: Diagnosis not present

## 2020-04-26 DIAGNOSIS — Z96651 Presence of right artificial knee joint: Secondary | ICD-10-CM | POA: Diagnosis not present

## 2020-05-02 DIAGNOSIS — Z20822 Contact with and (suspected) exposure to covid-19: Secondary | ICD-10-CM | POA: Diagnosis not present

## 2020-05-02 DIAGNOSIS — Z03818 Encounter for observation for suspected exposure to other biological agents ruled out: Secondary | ICD-10-CM | POA: Diagnosis not present

## 2020-05-06 ENCOUNTER — Emergency Department
Admission: EM | Admit: 2020-05-06 | Discharge: 2020-05-06 | Disposition: A | Discharge status: Home / Self Care | Payer: Medicare HMO | Attending: Emergency Medicine | Admitting: Emergency Medicine

## 2020-05-06 ENCOUNTER — Emergency Department: Payer: Medicare HMO

## 2020-05-06 ENCOUNTER — Other Ambulatory Visit: Payer: Self-pay

## 2020-05-06 ENCOUNTER — Encounter: Payer: Self-pay | Admitting: Emergency Medicine

## 2020-05-06 DIAGNOSIS — I10 Essential (primary) hypertension: Secondary | ICD-10-CM | POA: Diagnosis not present

## 2020-05-06 DIAGNOSIS — Z7901 Long term (current) use of anticoagulants: Secondary | ICD-10-CM | POA: Insufficient documentation

## 2020-05-06 DIAGNOSIS — E119 Type 2 diabetes mellitus without complications: Secondary | ICD-10-CM | POA: Diagnosis not present

## 2020-05-06 DIAGNOSIS — Z7984 Long term (current) use of oral hypoglycemic drugs: Secondary | ICD-10-CM | POA: Diagnosis not present

## 2020-05-06 DIAGNOSIS — R1110 Vomiting, unspecified: Secondary | ICD-10-CM | POA: Diagnosis not present

## 2020-05-06 DIAGNOSIS — Z853 Personal history of malignant neoplasm of breast: Secondary | ICD-10-CM | POA: Diagnosis not present

## 2020-05-06 DIAGNOSIS — Z96651 Presence of right artificial knee joint: Secondary | ICD-10-CM | POA: Insufficient documentation

## 2020-05-06 DIAGNOSIS — R110 Nausea: Secondary | ICD-10-CM | POA: Insufficient documentation

## 2020-05-06 DIAGNOSIS — I2510 Atherosclerotic heart disease of native coronary artery without angina pectoris: Secondary | ICD-10-CM | POA: Insufficient documentation

## 2020-05-06 DIAGNOSIS — U071 COVID-19: Secondary | ICD-10-CM | POA: Diagnosis not present

## 2020-05-06 DIAGNOSIS — Z79899 Other long term (current) drug therapy: Secondary | ICD-10-CM | POA: Insufficient documentation

## 2020-05-06 DIAGNOSIS — R059 Cough, unspecified: Secondary | ICD-10-CM | POA: Diagnosis present

## 2020-05-06 DIAGNOSIS — R112 Nausea with vomiting, unspecified: Secondary | ICD-10-CM | POA: Diagnosis not present

## 2020-05-06 DIAGNOSIS — R079 Chest pain, unspecified: Secondary | ICD-10-CM | POA: Diagnosis not present

## 2020-05-06 LAB — COMPREHENSIVE METABOLIC PANEL
ALT: 11 U/L (ref 0–44)
AST: 27 U/L (ref 15–41)
Albumin: 3.7 g/dL (ref 3.5–5.0)
Alkaline Phosphatase: 40 U/L (ref 38–126)
Anion gap: 15 (ref 5–15)
BUN: 8 mg/dL (ref 8–23)
CO2: 22 mmol/L (ref 22–32)
Calcium: 8.9 mg/dL (ref 8.9–10.3)
Chloride: 95 mmol/L — ABNORMAL LOW (ref 98–111)
Creatinine, Ser: 0.76 mg/dL (ref 0.44–1.00)
GFR, Estimated: >60 mL/min (ref >60)
Glucose, Bld: 177 mg/dL — ABNORMAL HIGH (ref 70–99)
Potassium: 3.3 mmol/L — ABNORMAL LOW (ref 3.5–5.1)
Sodium: 132 mmol/L — ABNORMAL LOW (ref 135–145)
Total Bilirubin: 0.8 mg/dL (ref 0.3–1.2)
Total Protein: 7.8 g/dL (ref 6.5–8.1)

## 2020-05-06 LAB — CBC
HCT: 33.9 % — ABNORMAL LOW (ref 36.0–46.0)
Hemoglobin: 11.2 g/dL — ABNORMAL LOW (ref 12.0–15.0)
MCH: 26.8 pg (ref 26.0–34.0)
MCHC: 33 g/dL (ref 30.0–36.0)
MCV: 81.1 fL (ref 80.0–100.0)
Platelets: 189 10*3/uL (ref 150–400)
RBC: 4.18 MIL/uL (ref 3.87–5.11)
RDW: 16.1 % — ABNORMAL HIGH (ref 11.5–15.5)
WBC: 3.4 10*3/uL — ABNORMAL LOW (ref 4.0–10.5)
nRBC: 0 % (ref 0.0–0.2)

## 2020-05-06 LAB — LIPASE, BLOOD: Lipase: 34 U/L (ref 11–51)

## 2020-05-06 MED ORDER — LACTATED RINGERS IV BOLUS
1000.0000 mL | Freq: Once | INTRAVENOUS | Status: AC
  Administered 2020-05-06: 1000 mL via INTRAVENOUS

## 2020-05-06 MED ORDER — PREDNISONE 10 MG (21) PO TBPK: ORAL_TABLET | ORAL | 0 refills | Status: DC

## 2020-05-06 MED ORDER — ONDANSETRON HCL 4 MG/2ML IJ SOLN
4.0000 mg | Freq: Once | INTRAMUSCULAR | Status: AC
  Administered 2020-05-06: 4 mg via INTRAVENOUS
  Filled 2020-05-06: qty 2

## 2020-05-06 MED ORDER — TRAMADOL HCL 50 MG PO TABS
50.0000 mg | ORAL_TABLET | Freq: Once | ORAL | Status: AC
  Administered 2020-05-06: 50 mg via ORAL
  Filled 2020-05-06: qty 1

## 2020-05-06 NOTE — ED Provider Notes (Signed)
[facility] Emergency Department Provider Note ____________________________________________   Event Date/Time   First MD Initiated Contact with Patient 05/06/20 1312     (approximate)  I have reviewed the triage vital signs and the nursing notes.   HISTORY  Chief Complaint Generalized Body Aches and Nausea  HPI Elaine Cox is a 75 y.o. female with history of MI, CAD, DM,  presents to the emergency department for treatment and evaluation after being diagnosed with COVID-19.  She states that she has had cough, fever, and generalized body aches.  She also states that she has had nausea and some intermittent diarrhea as well.         Past Medical History:  Diagnosis Date  . Anginal pain ([place]) 06/2016   being followed by dr. Ubaldo Glassing  . Anxiety   . Arthritis    Osteoarthritis  . Arthritis    Rheumatoid  . Breast cancer ([place]) 2000   Right Breast - Chemotherapy  . 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  . Coronary artery disease   . Diabetes mellitus without complication [facility])    Patient takes Metformin  . Dyspnea 03/2017  . GERD (gastroesophageal reflux disease)   . History of abdominal hysterectomy   . History of eyelid surgery   . History of kidney stones   . Hyperlipidemia   . Hypertension   . Lumbago   . Murmur   . Myocardial infarction ([place]) 07/2010  . Orthopnea 03/2017  . Personal history of chemotherapy 2000   right breast ca  . Sinus disorder   . Stroke [facility]) 2006, 2012  . Wears dentures    full upper and lower    Patient Active Problem List   Diagnosis Date Noted  . 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 Pelvic pressure in female 03/13/2020  . Panic anxiety syndrome 11/18/2018  . Syncope 03/07/2018  . NSTEMI (non-ST elevated myocardial infarction) ([place]) 02/22/2018  . Anxiety 02/22/2018  . Diabetes ([place]) 02/22/2018  . HTN (hypertension) 02/22/2018  . GERD (gastroesophageal reflux disease)  02/22/2018  . CAD (coronary artery disease) 02/22/2018  . HLD (hyperlipidemia) 02/22/2018  . Unilateral primary osteoarthritis, right knee 09/23/2017  . Bursitis of right shoulder 05/25/2017  . Encounter for long-term (current) use of high-risk medication 01/24/2016  . Numbness and tingling in left arm 10/24/2015  . 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 Elbow joint effusion, left 07/13/2015  . 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 Stroke ([place]) 03/14/2014  . Osteoarthritis 11/16/2013  . 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 Benign essential hypertension 07/02/2013  . Enthesopathy of hip region 07/02/2013  . Low back pain 07/02/2013    Past Surgical History:  Procedure Laterality Date  . ABDOMINAL HYSTERECTOMY    . BREAST SURGERY Right    mastectomy   . CARDIAC CATHETERIZATION  2012   stent placed  . CATARACT EXTRACTION W/PHACO Right 06/24/2017   Procedure: CATARACT EXTRACTION PHACO AND INTRAOCULAR LENS PLACEMENT ([place]) COMPLICATED RIGHT DIABETIC;  Surgeon: Leandrew Koyanagi, MD;  Location: [place];  Service: Ophthalmology;  Laterality: Right;  Diabetes - oral med  . COLONOSCOPY    . COLONOSCOPY WITH PROPOFOL N/A 12/08/2017   Procedure: COLONOSCOPY WITH PROPOFOL;  Surgeon: Toledo, Benay Pike, MD;  Location: [facility];  Service: Gastroenterology;  Laterality: N/A;  . CORONARY ANGIOPLASTY    . CORONARY STENT INTERVENTION N/A 02/24/2018   Procedure: CORONARY STENT INTERVENTION;  Surgeon: [name], MD;  Location: [place] CV LAB;  Service: Cardiovascular;  Laterality: N/A;  . CYSTOSCOPY N/A 09/08/2016   Procedure: CYSTOSCOPY;  Surgeon: Ward, Honor Loh, MD;  Location: [facility] ORS;  Service: Gynecology;  Laterality: N/A;  . EYE SURGERY Left    Cataract Extraction with IOL  . KNEE ARTHROPLASTY Right 03/14/2020   Procedure: COMPUTER ASSISTED TOTAL KNEE ARTHROPLASTY;  Surgeon: Dereck Leep, MD;   Location: [facility] OR[facility];  Service: Orthopedics;  Laterality: Right;  FOLLOWING 1ST CASE  . KNEE ARTHROSCOPY W/ MENISCAL REPAIR Right   . LAPAROSCOPIC BILATERAL SALPINGO OOPHERECTOMY Bilateral 09/08/2016   Procedure: LAPAROSCOPIC BILATERAL SALPINGO OOPHORECTOMY;  Surgeon: Ward, Honor Loh, MD;  Location: [facility] OR[facility];  Service: Gynecology;  Laterality: Bilateral;  . LEFT HEART CATH AND CORONARY ANGIOGRAPHY Left 02/24/2018   Procedure: LEFT HEART CATH AND CORONARY ANGIOGRAPHY;  Surgeon: [name], MD;  Location: [place] CV LAB;  Service: Cardiovascular;  Laterality: Left;  Marland Kitchen MASTECTOMY Right 2000   BREAST CA    Prior to Admission medications   Medication Sig Start Date End Date Taking? Authorizing Provider  ACCU-CHEK GUIDE test strip  12/08/19   [provider]  acetaminophen (TYLENOL) 325 MG tablet Take 2 tablets (650 mg total) by mouth every 6 (six) hours as needed for mild pain (or Fever >/= 101). 03/09/18   Nicholes Mango, MD  Alcohol Swabs (B-D SINGLE USE SWABS REGULAR) PADS  12/20/19   [provider]  ALPRAZolam Duanne Moron) 0.25 MG tablet Take 0.25 mg by mouth at bedtime. 03/16/18   [provider]  amLODipine (NORVASC) 2.5 MG tablet Take 2.5 mg by mouth daily.  02/08/20 02/07/21  [provider]  APPLE CIDER VINEGAR PO Take 2 tablets by mouth daily.    [provider]  Biotin 800 MCG TABS Take 800 mcg by mouth daily.     [provider]  bisacodyl (DULCOLAX) 5 MG EC tablet Take 5 mg by mouth daily.     [provider]  Blood Glucose Calibration (TRUE METRIX LEVEL 1) Low SOLN  10/24/19   [provider]  Blood Glucose Monitoring Suppl (GLUCOCOM BLOOD GLUCOSE MONITOR) DEVI by XX route once daily 06/28/19 06/27/20  [provider]  celecoxib (CELEBREX) 200 MG capsule Take 1 capsule (200 mg total) by mouth 2 (two) times daily. 03/16/20   Tamala Julian B, PA-C  enoxaparin (LOVENOX) 40 MG/0.4ML injection Inject 0.4 mLs  (40 mg total) into the skin daily for 14 days. 03/16/20 03/30/20  Tamala Julian B, PA-C  hydrOXYzine (ATARAX/VISTARIL) 10 MG tablet Take 10 mg by mouth daily.     [provider]  Menthol-Methyl Salicylate (MUSCLE RUB) 10-15 % CREA Apply 1 application topically as needed for muscle pain.    [provider]  metFORMIN (GLUCOPHAGE) 500 MG tablet Take 500 mg by mouth 2 (two) times daily with a meal.    [provider]  metoprolol succinate (TOPROL-XL) 100 MG 24 hr tablet Take 100 mg by mouth daily.  06/15/19   [provider]  nitroGLYCERIN (NITROSTAT) 0.4 MG SL tablet Place 0.4 mg under the tongue every 5 (five) minutes as needed for chest pain.     [provider]  omeprazole (PRILOSEC) 20 MG capsule Take 20 mg by mouth daily.    [provider]  oxyCODONE (OXY IR/ROXICODONE) 5 MG immediate release tablet Take 1 tablet (5 mg total) by mouth every 4 (four) hours as needed for moderate pain (pain score 4-6). 03/16/20   Fausto Skillern,  PA-C  Polyvinyl Alcohol-Povidone (MURINE TEARS FOR DRY EYES OP) Apply 1 drop to eye daily as needed (dry eyes).    [provider]  Potassium Chloride ER 20 MEQ TBCR Take 20 mEq by mouth daily.  11/19/18   [provider]  predniSONE (STERAPRED UNI-PAK 21 TAB) 10 MG (21) TBPK tablet Take 6 tablets on the first day and decrease by 1 tablet each day until finished. 05/06/20   Sherrie George B, FNP  rosuvastatin (CRESTOR) 5 MG tablet Take 5 mg by mouth every Monday, Wednesday, and Friday.  09/28/19 09/27/20  [provider]  Tofacitinib Citrate (XELJANZ) 5 MG TABS Take 5 mg by mouth 2 (two) times daily.     [provider]  traMADol (ULTRAM) 50 MG tablet Take 1 tablet (50 mg total) by mouth every 4 (four) hours as needed for moderate pain. 03/16/20   Fausto Skillern, PA-C    Allergies Doxazosin, Fish allergy, Dicloxacillin, Iodine, Sulfa antibiotics, Beta adrenergic blockers, Cardura  [doxazosin mesylate], Etodolac, Sulfamethoxazole-trimethoprim, Azithromycin, Ciprofloxacin, Clopidogrel, Colchicine, Glucosamine, Leflunomide, Meloxicam, Methotrexate, and Zoloft [sertraline]  Family History  Problem Relation Age of Onset  . Diabetes type II Mother   . Hypertension Mother   . Colon polyps Mother   . Breast cancer Maternal Aunt     Social History Social History   Tobacco Use  . Smoking status: Never Smoker  . Smokeless tobacco: Never Used  Vaping Use  . Vaping Use: Never used  Substance Use Topics  . Alcohol use: No  . Drug use: No    Review of Systems  Constitutional: Positive for fever/chills/body aches Eyes: No visual changes. ENT: Positive for sore throat. Cardiovascular: Denies chest pain. Respiratory: Denies shortness of breath.  Positive for cough Gastrointestinal: No abdominal pain.  Positive nausea, no vomiting.  Positive for diarrhea.  No constipation. Genitourinary: Negative for dysuria. Musculoskeletal: Negative for back pain. Skin: Negative for rash. Neurological: Negative for headaches, focal weakness or numbness.  ____________________________________________   PHYSICAL EXAM:  VITAL SIGNS: ED Triage Vitals  Enc Vitals Group     BP 05/06/20 1040 (!) 109/53     Pulse Rate 05/06/20 1040 71     Resp 05/06/20 1040 16     Temp 05/06/20 1040 99.4 F (37.4 C)     Temp Source 05/06/20 1040 Oral     SpO2 05/06/20 1040 97 %     Weight 05/06/20 1038 177 lb (80.3 kg)     Height 05/06/20 1038 5\' 2"  (1.575 m)     Head Circumference --      Peak Flow --      Pain Score 05/06/20 1038 10     Pain Loc --      Pain Edu? --      Excl. in [place]? --     Constitutional: Alert and oriented. Well appearing and in no acute distress. Eyes: Conjunctivae are normal.  Head: Atraumatic. Nose: No congestion/rhinnorhea. Mouth/Throat: Mucous membranes are moist.  Oropharynx non-erythematous. Neck: No stridor.   Hematological/Lymphatic/Immunilogical: No  cervical lymphadenopathy. Cardiovascular: Normal rate, regular rhythm. Grossly normal heart sounds.  Good peripheral circulation. Respiratory: Normal respiratory effort.  No retractions. Lungs CTAB. Gastrointestinal: Soft and nontender. No distention. No abdominal bruits. No CVA tenderness. Genitourinary:  Musculoskeletal: No lower extremity tenderness nor edema.  No joint effusions. Neurologic:  Normal speech and language. No gross focal neurologic deficits are appreciated. No gait instability. Skin:  Skin is warm, dry and intact. No rash noted. Psychiatric: Mood and  affect are normal. Speech and behavior are normal.  ____________________________________________   LABS (all labs ordered are listed, but only abnormal results are displayed)  Labs Reviewed  COMPREHENSIVE METABOLIC PANEL - Abnormal; Notable for the following components:      Result Value   Sodium 132 (*)    Potassium 3.3 (*)    Chloride 95 (*)    Glucose, Bld 177 (*)    All other components within normal limits  CBC - Abnormal; Notable for the following components:   WBC 3.4 (*)    Hemoglobin 11.2 (*)    HCT 33.9 (*)    RDW 16.1 (*)    All other components within normal limits  LIPASE, BLOOD   ____________________________________________  EKG  ED ECG REPORT I, Atarah Cadogan, FNP-BC personally viewed and interpreted this ECG.   Date: 05/06/2020  EKG Time: 1018  Rate: 78  Rhythm: normal EKG, normal sinus rhythm  Axis: normal  Intervals:none  ST&T Change: no ST elevation  ____________________________________________  RADIOLOGY  ED MD interpretation:    No evidence of pneumonia on chest x-ray.  I, Sherrie George, personally viewed and evaluated these images (plain radiographs) as part of my medical decision making, as well as reviewing the written report by the radiologist.  Official radiology report(s): DG Chest 1 View  Result Date: 05/06/2020 CLINICAL DATA:  Chest pain, nausea and vomiting for 2  days. COVID positive 4 days ago. EXAM: CHEST  1 VIEW COMPARISON:  Chest x-ray dated 03/07/2018. FINDINGS: Heart size and mediastinal contours are within normal limits. Lungs appear clear. No pleural effusion or pneumothorax is seen. Osseous structures about the chest are unremarkable. IMPRESSION: No active disease. No evidence of pneumonia. Electronically Signed   By: Franki Cabot M.D.   On: 05/06/2020 14:30    ____________________________________________   PROCEDURES  Procedure(s) performed (including Critical Care):  Procedures  ____________________________________________   INITIAL IMPRESSION / ASSESSMENT AND PLAN     75 year old female presenting to the emergency department after being diagnosed with COVID-19.  See HPI for further details.  Plan will be to review protocol labs and testing obtained while awaiting ER room assignment.  DIFFERENTIAL DIAGNOSIS  COVID-19, COVID-19 pneumonia, viral illness  ED COURSE   Clinical Course as of 05/06/20 2107  Nancy Fetter May 06, 2020  1410 Nausea has resolved. No vomiting after applesauce and crackers.  Chest x-ray without concern for pneumonia. Oxygen saturation on room air is 95%.  Labs overall reassuring. Will give one dose of K-Dur in addition to her daily dose.  Plan will be to discharge her home with prednisone. She was made aware that it will likely cause her glucose to increase and that strict adherence to diabetic diet in addition to her medications is important. She acknowledged understanding. IV fluids still infusing. Once completed, she will be ready for discharge. She agrees and feels comfortable with the plan. [CT]    Clinical Course User Index [CT] Shonteria Abeln B, FNP   ___________________________________________   FINAL CLINICAL IMPRESSION(S) / ED DIAGNOSES  Final diagnoses:  [place]     ED Discharge Orders         Ordered    predniSONE (STERAPRED UNI-PAK 21 TAB) 10 MG (21) TBPK tablet  Status:  Discontinued         05/06/20 1620    predniSONE (STERAPRED UNI-PAK 21 TAB) 10 MG (21) TBPK tablet        05/06/20 1626           Leda Gauze  D Melkonian was evaluated in Emergency Department on 05/06/2020 for the symptoms described in the history of present illness. She was evaluated in the context of the global COVID-19 pandemic, which necessitated consideration that the patient might be at risk for infection with the SARS-CoV-2 virus that causes COVID-19. Institutional protocols and algorithms that pertain to the evaluation of patients at risk for COVID-19 are in a state of rapid change based on information released by regulatory bodies including the CDC and federal and state organizations. These policies and algorithms were followed during the patient's care in the ED.   Note:  This document was prepared using Dragon voice recognition software and may include unintentional dictation errors.   Victorino Dike, FNP 05/06/20 2107    Harvest Dark, MD 05/08/20 2216

## 2020-05-06 NOTE — ED Triage Notes (Signed)
Pt reports tested positive for covid on 1/26 and is having some body aches all over and some nausea and intermittent dirrhea

## 2020-05-06 NOTE — ED Notes (Signed)
Patient given apple sauce and graham crackers. 

## 2020-05-06 NOTE — ED Notes (Signed)
Pt up to use bathroom 

## 2020-05-25 DIAGNOSIS — I2510 Atherosclerotic heart disease of native coronary artery without angina pectoris: Secondary | ICD-10-CM | POA: Diagnosis not present

## 2020-05-25 DIAGNOSIS — I214 Non-ST elevation (NSTEMI) myocardial infarction: Secondary | ICD-10-CM | POA: Diagnosis not present

## 2020-05-28 DIAGNOSIS — N907 Vulvar cyst: Secondary | ICD-10-CM | POA: Diagnosis not present

## 2020-05-30 DIAGNOSIS — Z780 Asymptomatic menopausal state: Secondary | ICD-10-CM | POA: Diagnosis not present

## 2020-05-30 DIAGNOSIS — D649 Anemia, unspecified: Secondary | ICD-10-CM | POA: Diagnosis not present

## 2020-05-30 DIAGNOSIS — M05722 Rheumatoid arthritis with rheumatoid factor of left elbow without organ or systems involvement: Secondary | ICD-10-CM | POA: Diagnosis not present

## 2020-05-30 DIAGNOSIS — I10 Essential (primary) hypertension: Secondary | ICD-10-CM | POA: Diagnosis not present

## 2020-05-30 DIAGNOSIS — F419 Anxiety disorder, unspecified: Secondary | ICD-10-CM | POA: Diagnosis not present

## 2020-05-30 DIAGNOSIS — M05721 Rheumatoid arthritis with rheumatoid factor of right elbow without organ or systems involvement: Secondary | ICD-10-CM | POA: Diagnosis not present

## 2020-05-30 DIAGNOSIS — Z0000 Encounter for general adult medical examination without abnormal findings: Secondary | ICD-10-CM | POA: Diagnosis not present

## 2020-05-30 DIAGNOSIS — M1711 Unilateral primary osteoarthritis, right knee: Secondary | ICD-10-CM | POA: Diagnosis not present

## 2020-05-30 DIAGNOSIS — E1165 Type 2 diabetes mellitus with hyperglycemia: Secondary | ICD-10-CM | POA: Diagnosis not present

## 2020-05-30 DIAGNOSIS — I2510 Atherosclerotic heart disease of native coronary artery without angina pectoris: Secondary | ICD-10-CM | POA: Diagnosis not present

## 2020-05-30 DIAGNOSIS — E538 Deficiency of other specified B group vitamins: Secondary | ICD-10-CM | POA: Diagnosis not present

## 2020-06-06 DIAGNOSIS — F418 Other specified anxiety disorders: Secondary | ICD-10-CM | POA: Diagnosis not present

## 2020-06-06 DIAGNOSIS — Z79899 Other long term (current) drug therapy: Secondary | ICD-10-CM | POA: Diagnosis not present

## 2020-06-06 DIAGNOSIS — M069 Rheumatoid arthritis, unspecified: Secondary | ICD-10-CM | POA: Diagnosis not present

## 2020-06-06 DIAGNOSIS — D649 Anemia, unspecified: Secondary | ICD-10-CM | POA: Diagnosis not present

## 2020-06-06 DIAGNOSIS — E119 Type 2 diabetes mellitus without complications: Secondary | ICD-10-CM | POA: Diagnosis not present

## 2020-06-06 DIAGNOSIS — E538 Deficiency of other specified B group vitamins: Secondary | ICD-10-CM | POA: Diagnosis not present

## 2020-06-06 DIAGNOSIS — J019 Acute sinusitis, unspecified: Secondary | ICD-10-CM | POA: Diagnosis not present

## 2020-06-06 DIAGNOSIS — I2510 Atherosclerotic heart disease of native coronary artery without angina pectoris: Secondary | ICD-10-CM | POA: Diagnosis not present

## 2020-06-06 DIAGNOSIS — I10 Essential (primary) hypertension: Secondary | ICD-10-CM | POA: Diagnosis not present

## 2020-06-07 DIAGNOSIS — D649 Anemia, unspecified: Secondary | ICD-10-CM | POA: Diagnosis not present

## 2020-07-12 DIAGNOSIS — R399 Unspecified symptoms and signs involving the genitourinary system: Secondary | ICD-10-CM | POA: Diagnosis not present

## 2020-07-12 DIAGNOSIS — R309 Painful micturition, unspecified: Secondary | ICD-10-CM | POA: Diagnosis not present

## 2020-08-13 DIAGNOSIS — R112 Nausea with vomiting, unspecified: Secondary | ICD-10-CM | POA: Diagnosis not present

## 2020-08-13 DIAGNOSIS — R509 Fever, unspecified: Secondary | ICD-10-CM | POA: Diagnosis not present

## 2020-08-13 DIAGNOSIS — N390 Urinary tract infection, site not specified: Secondary | ICD-10-CM | POA: Diagnosis not present

## 2020-08-13 DIAGNOSIS — R300 Dysuria: Secondary | ICD-10-CM | POA: Diagnosis not present

## 2020-08-14 ENCOUNTER — Inpatient Hospital Stay
Admission: EM | Admit: 2020-08-14 | Discharge: 2020-08-16 | DRG: 872 | Disposition: A | Discharge status: Home / Self Care | Payer: Medicare HMO | Attending: Internal Medicine | Admitting: Internal Medicine

## 2020-08-14 ENCOUNTER — Emergency Department: Payer: Medicare HMO

## 2020-08-14 ENCOUNTER — Other Ambulatory Visit: Payer: Self-pay

## 2020-08-14 DIAGNOSIS — M05721 Rheumatoid arthritis with rheumatoid factor of right elbow without organ or systems involvement: Secondary | ICD-10-CM | POA: Diagnosis present

## 2020-08-14 DIAGNOSIS — Z8673 Personal history of transient ischemic attack (TIA), and cerebral infarction without residual deficits: Secondary | ICD-10-CM | POA: Diagnosis not present

## 2020-08-14 DIAGNOSIS — R6520 Severe sepsis without septic shock: Secondary | ICD-10-CM | POA: Diagnosis present

## 2020-08-14 DIAGNOSIS — F419 Anxiety disorder, unspecified: Secondary | ICD-10-CM | POA: Diagnosis present

## 2020-08-14 DIAGNOSIS — A419 Sepsis, unspecified organism: Secondary | ICD-10-CM | POA: Diagnosis not present

## 2020-08-14 DIAGNOSIS — E785 Hyperlipidemia, unspecified: Secondary | ICD-10-CM | POA: Diagnosis present

## 2020-08-14 DIAGNOSIS — M069 Rheumatoid arthritis, unspecified: Secondary | ICD-10-CM | POA: Diagnosis present

## 2020-08-14 DIAGNOSIS — R55 Syncope and collapse: Secondary | ICD-10-CM | POA: Diagnosis not present

## 2020-08-14 DIAGNOSIS — Z803 Family history of malignant neoplasm of breast: Secondary | ICD-10-CM

## 2020-08-14 DIAGNOSIS — Z9010 Acquired absence of unspecified breast and nipple: Secondary | ICD-10-CM

## 2020-08-14 DIAGNOSIS — Z91013 Allergy to seafood: Secondary | ICD-10-CM

## 2020-08-14 DIAGNOSIS — N200 Calculus of kidney: Secondary | ICD-10-CM | POA: Diagnosis not present

## 2020-08-14 DIAGNOSIS — Z833 Family history of diabetes mellitus: Secondary | ICD-10-CM

## 2020-08-14 DIAGNOSIS — I959 Hypotension, unspecified: Secondary | ICD-10-CM | POA: Diagnosis not present

## 2020-08-14 DIAGNOSIS — Z7982 Long term (current) use of aspirin: Secondary | ICD-10-CM | POA: Diagnosis not present

## 2020-08-14 DIAGNOSIS — N12 Tubulo-interstitial nephritis, not specified as acute or chronic: Secondary | ICD-10-CM | POA: Diagnosis not present

## 2020-08-14 DIAGNOSIS — Z87440 Personal history of urinary (tract) infections: Secondary | ICD-10-CM

## 2020-08-14 DIAGNOSIS — Z7984 Long term (current) use of oral hypoglycemic drugs: Secondary | ICD-10-CM

## 2020-08-14 DIAGNOSIS — I10 Essential (primary) hypertension: Secondary | ICD-10-CM | POA: Diagnosis present

## 2020-08-14 DIAGNOSIS — M1990 Unspecified osteoarthritis, unspecified site: Secondary | ICD-10-CM | POA: Diagnosis present

## 2020-08-14 DIAGNOSIS — Z886 Allergy status to analgesic agent status: Secondary | ICD-10-CM

## 2020-08-14 DIAGNOSIS — K219 Gastro-esophageal reflux disease without esophagitis: Secondary | ICD-10-CM | POA: Diagnosis present

## 2020-08-14 DIAGNOSIS — I700 Atherosclerosis of aorta: Secondary | ICD-10-CM | POA: Diagnosis not present

## 2020-08-14 DIAGNOSIS — I252 Old myocardial infarction: Secondary | ICD-10-CM

## 2020-08-14 DIAGNOSIS — R42 Dizziness and giddiness: Secondary | ICD-10-CM | POA: Diagnosis not present

## 2020-08-14 DIAGNOSIS — E119 Type 2 diabetes mellitus without complications: Secondary | ICD-10-CM

## 2020-08-14 DIAGNOSIS — B370 Candidal stomatitis: Secondary | ICD-10-CM | POA: Diagnosis not present

## 2020-08-14 DIAGNOSIS — Z6832 Body mass index (BMI) 32.0-32.9, adult: Secondary | ICD-10-CM

## 2020-08-14 DIAGNOSIS — Z9221 Personal history of antineoplastic chemotherapy: Secondary | ICD-10-CM | POA: Diagnosis not present

## 2020-08-14 DIAGNOSIS — Z882 Allergy status to sulfonamides status: Secondary | ICD-10-CM

## 2020-08-14 DIAGNOSIS — Z853 Personal history of malignant neoplasm of breast: Secondary | ICD-10-CM | POA: Diagnosis not present

## 2020-08-14 DIAGNOSIS — Z79899 Other long term (current) drug therapy: Secondary | ICD-10-CM

## 2020-08-14 DIAGNOSIS — E669 Obesity, unspecified: Secondary | ICD-10-CM | POA: Diagnosis present

## 2020-08-14 DIAGNOSIS — I2510 Atherosclerotic heart disease of native coronary artery without angina pectoris: Secondary | ICD-10-CM | POA: Diagnosis not present

## 2020-08-14 DIAGNOSIS — Z20822 Contact with and (suspected) exposure to covid-19: Secondary | ICD-10-CM | POA: Diagnosis present

## 2020-08-14 DIAGNOSIS — Z87442 Personal history of urinary calculi: Secondary | ICD-10-CM | POA: Diagnosis not present

## 2020-08-14 DIAGNOSIS — Z8371 Family history of colonic polyps: Secondary | ICD-10-CM

## 2020-08-14 DIAGNOSIS — M47816 Spondylosis without myelopathy or radiculopathy, lumbar region: Secondary | ICD-10-CM | POA: Diagnosis not present

## 2020-08-14 DIAGNOSIS — Z888 Allergy status to other drugs, medicaments and biological substances status: Secondary | ICD-10-CM

## 2020-08-14 DIAGNOSIS — Z96651 Presence of right artificial knee joint: Secondary | ICD-10-CM | POA: Diagnosis present

## 2020-08-14 DIAGNOSIS — Z7902 Long term (current) use of antithrombotics/antiplatelets: Secondary | ICD-10-CM

## 2020-08-14 DIAGNOSIS — Z955 Presence of coronary angioplasty implant and graft: Secondary | ICD-10-CM

## 2020-08-14 DIAGNOSIS — Z881 Allergy status to other antibiotic agents status: Secondary | ICD-10-CM

## 2020-08-14 DIAGNOSIS — Z8249 Family history of ischemic heart disease and other diseases of the circulatory system: Secondary | ICD-10-CM

## 2020-08-14 LAB — CBC WITH DIFFERENTIAL/PLATELET
Abs Immature Granulocytes: 0.06 10*3/uL (ref 0.00–0.07)
Abs Immature Granulocytes: 0.06 10*3/uL (ref 0.00–0.07)
Basophils Absolute: 0 10*3/uL (ref 0.0–0.1)
Basophils Absolute: 0 10*3/uL (ref 0.0–0.1)
Basophils Relative: 0 %
Basophils Relative: 0 %
Eosinophils Absolute: 0 10*3/uL (ref 0.0–0.5)
Eosinophils Absolute: 0.1 10*3/uL (ref 0.0–0.5)
Eosinophils Relative: 0 %
Eosinophils Relative: 0 %
HCT: 29.8 % — ABNORMAL LOW (ref 36.0–46.0)
HCT: 27.8 % — ABNORMAL LOW (ref 36.0–46.0)
Hemoglobin: 10.2 g/dL — ABNORMAL LOW (ref 12.0–15.0)
Hemoglobin: 9.5 g/dL — ABNORMAL LOW (ref 12.0–15.0)
Immature Granulocytes: 0 %
Immature Granulocytes: 0 %
Lymphocytes Relative: 6 %
Lymphocytes Relative: 8 %
Lymphs Abs: 0.8 10*3/uL (ref 0.7–4.0)
Lymphs Abs: 1.1 10*3/uL (ref 0.7–4.0)
MCH: 26.8 pg (ref 26.0–34.0)
MCH: 26.8 pg (ref 26.0–34.0)
MCHC: 34.2 g/dL (ref 30.0–36.0)
MCHC: 34.2 g/dL (ref 30.0–36.0)
MCV: 78.2 fL — ABNORMAL LOW (ref 80.0–100.0)
MCV: 78.3 fL — ABNORMAL LOW (ref 80.0–100.0)
Monocytes Absolute: 1.1 10*3/uL — ABNORMAL HIGH (ref 0.1–1.0)
Monocytes Absolute: 1.3 10*3/uL — ABNORMAL HIGH (ref 0.1–1.0)
Monocytes Relative: 7 %
Monocytes Relative: 9 %
Neutro Abs: 13.3 10*3/uL — ABNORMAL HIGH (ref 1.7–7.7)
Neutro Abs: 11 10*3/uL — ABNORMAL HIGH (ref 1.7–7.7)
Neutrophils Relative %: 87 %
Neutrophils Relative %: 83 %
Platelets: 348 10*3/uL (ref 150–400)
Platelets: 347 10*3/uL (ref 150–400)
RBC: 3.81 MIL/uL — ABNORMAL LOW (ref 3.87–5.11)
RBC: 3.55 MIL/uL — ABNORMAL LOW (ref 3.87–5.11)
RDW: 18.1 % — ABNORMAL HIGH (ref 11.5–15.5)
RDW: 18.4 % — ABNORMAL HIGH (ref 11.5–15.5)
WBC: 15.4 10*3/uL — ABNORMAL HIGH (ref 4.0–10.5)
WBC: 13.5 10*3/uL — ABNORMAL HIGH (ref 4.0–10.5)
nRBC: 0 % (ref 0.0–0.2)
nRBC: 0 % (ref 0.0–0.2)

## 2020-08-14 LAB — COMPREHENSIVE METABOLIC PANEL
ALT: 7 U/L (ref 0–44)
AST: 16 U/L (ref 15–41)
Albumin: 3.3 g/dL — ABNORMAL LOW (ref 3.5–5.0)
Alkaline Phosphatase: 37 U/L — ABNORMAL LOW (ref 38–126)
Anion gap: 12 (ref 5–15)
BUN: 15 mg/dL (ref 8–23)
CO2: 27 mmol/L (ref 22–32)
Calcium: 8.6 mg/dL — ABNORMAL LOW (ref 8.9–10.3)
Chloride: 95 mmol/L — ABNORMAL LOW (ref 98–111)
Creatinine, Ser: 1.06 mg/dL — ABNORMAL HIGH (ref 0.44–1.00)
GFR, Estimated: 55 mL/min — ABNORMAL LOW (ref >60)
Glucose, Bld: 163 mg/dL — ABNORMAL HIGH (ref 70–99)
Potassium: 3.5 mmol/L (ref 3.5–5.1)
Sodium: 134 mmol/L — ABNORMAL LOW (ref 135–145)
Total Bilirubin: 1 mg/dL (ref 0.3–1.2)
Total Protein: 7.7 g/dL (ref 6.5–8.1)

## 2020-08-14 LAB — LACTIC ACID, PLASMA
Lactic Acid, Venous: 2.9 mmol/L (ref 0.5–1.9)
Lactic Acid, Venous: 2.8 mmol/L (ref 0.5–1.9)
Lactic Acid, Venous: 3.3 mmol/L (ref 0.5–1.9)

## 2020-08-14 LAB — GLUCOSE, CAPILLARY: Glucose-Capillary: 116 mg/dL — ABNORMAL HIGH (ref 70–99)

## 2020-08-14 LAB — URINALYSIS, COMPLETE (UACMP) WITH MICROSCOPIC
Bilirubin Urine: NEGATIVE
Glucose, UA: NEGATIVE mg/dL
Ketones, ur: NEGATIVE mg/dL
Nitrite: NEGATIVE
Protein, ur: 100 mg/dL — AB
Specific Gravity, Urine: 1.013 (ref 1.005–1.030)
WBC, UA: >50 WBC/hpf — ABNORMAL HIGH (ref 0–5)
pH: 5 (ref 5.0–8.0)

## 2020-08-14 LAB — RESP PANEL BY RT-PCR (FLU A&B, COVID) ARPGX2
Influenza A by PCR: NEGATIVE
Influenza B by PCR: NEGATIVE
SARS Coronavirus 2 by RT PCR: NEGATIVE

## 2020-08-14 LAB — PROTIME-INR
INR: 1.2 (ref 0.8–1.2)
Prothrombin Time: 15.2 seconds (ref 11.4–15.2)

## 2020-08-14 LAB — APTT: aPTT: 38 seconds — ABNORMAL HIGH (ref 24–36)

## 2020-08-14 LAB — MAGNESIUM: Magnesium: 1.4 mg/dL — ABNORMAL LOW (ref 1.7–2.4)

## 2020-08-14 LAB — TROPONIN I (HIGH SENSITIVITY)
Troponin I (High Sensitivity): 25 ng/L — ABNORMAL HIGH (ref <18)
Troponin I (High Sensitivity): 25 ng/L — ABNORMAL HIGH (ref <18)

## 2020-08-14 MED ORDER — ONDANSETRON HCL 4 MG/2ML IJ SOLN
4.0000 mg | Freq: Once | INTRAMUSCULAR | Status: AC
  Administered 2020-08-14: 4 mg via INTRAVENOUS
  Filled 2020-08-14: qty 2

## 2020-08-14 MED ORDER — PANTOPRAZOLE SODIUM 40 MG PO TBEC
40.0000 mg | DELAYED_RELEASE_TABLET | Freq: Every day | ORAL | Status: DC
  Administered 2020-08-15 – 2020-08-16 (×2): 40 mg via ORAL
  Filled 2020-08-14 (×2): qty 1

## 2020-08-14 MED ORDER — ACETAMINOPHEN 650 MG RE SUPP: 650.0000 mg | Freq: Four times a day (QID) | RECTAL | Status: DC | PRN

## 2020-08-14 MED ORDER — ONDANSETRON HCL 4 MG/2ML IJ SOLN: 4.0000 mg | Freq: Four times a day (QID) | INTRAMUSCULAR | Status: DC | PRN

## 2020-08-14 MED ORDER — OXYCODONE HCL 5 MG PO TABS: 5.0000 mg | ORAL_TABLET | ORAL | Status: DC | PRN

## 2020-08-14 MED ORDER — ROSUVASTATIN CALCIUM 5 MG PO TABS
5.0000 mg | ORAL_TABLET | ORAL | Status: DC
  Administered 2020-08-15: 5 mg via ORAL
  Filled 2020-08-14: qty 1

## 2020-08-14 MED ORDER — NITROGLYCERIN 0.4 MG SL SUBL: 0.4000 mg | SUBLINGUAL_TABLET | SUBLINGUAL | Status: DC | PRN

## 2020-08-14 MED ORDER — METFORMIN HCL 500 MG PO TABS
500.0000 mg | ORAL_TABLET | Freq: Two times a day (BID) | ORAL | Status: DC
  Administered 2020-08-14 – 2020-08-16 (×4): 500 mg via ORAL
  Filled 2020-08-14 (×4): qty 1

## 2020-08-14 MED ORDER — ALPRAZOLAM 0.5 MG PO TABS
0.2500 mg | ORAL_TABLET | Freq: Every day | ORAL | Status: DC
  Administered 2020-08-14 – 2020-08-15 (×2): 0.25 mg via ORAL
  Filled 2020-08-14 (×2): qty 1

## 2020-08-14 MED ORDER — LACTATED RINGERS IV SOLN: INTRAVENOUS | Status: DC

## 2020-08-14 MED ORDER — AMLODIPINE BESYLATE 5 MG PO TABS
2.5000 mg | ORAL_TABLET | Freq: Every day | ORAL | Status: DC
  Administered 2020-08-15 – 2020-08-16 (×2): 2.5 mg via ORAL
  Filled 2020-08-14 (×2): qty 1

## 2020-08-14 MED ORDER — CELECOXIB 200 MG PO CAPS: 200.0000 mg | ORAL_CAPSULE | Freq: Two times a day (BID) | ORAL | Status: DC

## 2020-08-14 MED ORDER — METOPROLOL SUCCINATE ER 100 MG PO TB24
100.0000 mg | ORAL_TABLET | Freq: Every day | ORAL | Status: DC
  Administered 2020-08-15: 100 mg via ORAL
  Filled 2020-08-14: qty 1

## 2020-08-14 MED ORDER — HYDROXYZINE HCL 10 MG PO TABS
10.0000 mg | ORAL_TABLET | Freq: Every day | ORAL | Status: DC
  Administered 2020-08-15 – 2020-08-16 (×2): 10 mg via ORAL
  Filled 2020-08-14 (×2): qty 1

## 2020-08-14 MED ORDER — ONDANSETRON HCL 4 MG PO TABS: 4.0000 mg | ORAL_TABLET | Freq: Four times a day (QID) | ORAL | Status: DC | PRN

## 2020-08-14 MED ORDER — ACETAMINOPHEN 325 MG PO TABS: 650.0000 mg | ORAL_TABLET | Freq: Four times a day (QID) | ORAL | Status: DC | PRN

## 2020-08-14 MED ORDER — SODIUM CHLORIDE 0.9 % IV SOLN
2.0000 g | INTRAVENOUS | Status: DC
  Administered 2020-08-15 – 2020-08-16 (×2): 2 g via INTRAVENOUS
  Filled 2020-08-14 (×2): qty 2

## 2020-08-14 MED ORDER — LACTATED RINGERS IV BOLUS
1000.0000 mL | Freq: Once | INTRAVENOUS | Status: AC
  Administered 2020-08-14: 1000 mL via INTRAVENOUS

## 2020-08-14 MED ORDER — ENOXAPARIN SODIUM 40 MG/0.4ML IJ SOSY
40.0000 mg | PREFILLED_SYRINGE | INTRAMUSCULAR | Status: DC
  Administered 2020-08-14 – 2020-08-15 (×2): 40 mg via SUBCUTANEOUS
  Filled 2020-08-14 (×2): qty 0.4

## 2020-08-14 MED ORDER — MAGNESIUM SULFATE 2 GM/50ML IV SOLN
2.0000 g | Freq: Once | INTRAVENOUS | Status: AC
  Administered 2020-08-14: 2 g via INTRAVENOUS
  Filled 2020-08-14 (×2): qty 50

## 2020-08-14 MED ORDER — SODIUM CHLORIDE 0.9 % IV BOLUS
1000.0000 mL | Freq: Once | INTRAVENOUS | Status: AC
  Administered 2020-08-14: 1000 mL via INTRAVENOUS

## 2020-08-14 MED ORDER — SODIUM CHLORIDE 0.9 % IV SOLN
1.0000 g | Freq: Once | INTRAVENOUS | Status: AC
  Administered 2020-08-14: 1 g via INTRAVENOUS
  Filled 2020-08-14: qty 10

## 2020-08-14 NOTE — Plan of Care (Signed)

## 2020-08-14 NOTE — ED Notes (Addendum)
Called lab to come draw cultures due to pt being hard stick. MD Tamala Julian aware, per order will start antibiotics and fluids in order to not delay care at this time.

## 2020-08-14 NOTE — ED Notes (Signed)
Informed RN bed assigned 1446 

## 2020-08-14 NOTE — ED Provider Notes (Addendum)
[facility] Emergency Department Provider Note  ____________________________________________   Event Date/Time   First MD Initiated Contact with Patient 08/14/20 1047     (approximate)  I have reviewed the triage vital signs and the nursing notes.   HISTORY  Chief Complaint Hypotension and Near Syncope    HPI Elaine Cox is a 75 y.o. female presents emergency department with hypotension this morning feeling woozy/lightheaded.  Patient states she was started on a new antibiotic yesterday for UTI.  Has had several recurrent UTIs.  They started her on doxycycline and Zofran for nausea.  States she has not been able to sleep.  States when she urinates she has sharp pain typical of when she has had a kidney stone before.  Is complaining of upper back pain.  States she also had fever and chills overnight.  States the urinary symptoms started 2 days ago.  She was seen by her PCP at [facility] yesterday    Past Medical History:  Diagnosis Date  . Anginal pain ([place]) 06/2016   being followed by dr. Ubaldo Glassing  . Anxiety   . Arthritis    Osteoarthritis  . Arthritis    Rheumatoid  . Breast cancer ([place]) 2000   Right Breast - Chemotherapy  . 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  . Coronary artery disease   . Diabetes mellitus without complication [facility])    Patient takes Metformin  . Dyspnea 03/2017  . GERD (gastroesophageal reflux disease)   . History of abdominal hysterectomy   . History of eyelid surgery   . History of kidney stones   . Hyperlipidemia   . Hypertension   . Lumbago   . Murmur   . Myocardial infarction ([place]) 07/2010  . Orthopnea 03/2017  . Personal history of chemotherapy 2000   right breast ca  . Sinus disorder   . Stroke [facility]) 2006, 2012  . Wears dentures    full upper and lower    Patient Active Problem List   Diagnosis Date Noted  . Pyelonephritis 08/14/2020  . 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 Pelvic  pressure in female 03/13/2020  . Panic anxiety syndrome 11/18/2018  . Syncope 03/07/2018  . NSTEMI (non-ST elevated myocardial infarction) ([place]) 02/22/2018  . Anxiety 02/22/2018  . Diabetes ([place]) 02/22/2018  . HTN (hypertension) 02/22/2018  . GERD (gastroesophageal reflux disease) 02/22/2018  . CAD (coronary artery disease) 02/22/2018  . HLD (hyperlipidemia) 02/22/2018  . Unilateral primary osteoarthritis, right knee 09/23/2017  . Bursitis of right shoulder 05/25/2017  . Encounter for long-term (current) use of high-risk medication 01/24/2016  . Numbness and tingling in left arm 10/24/2015  . 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 Elbow joint effusion, left 07/13/2015  . 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 Stroke ([place]) 03/14/2014  . Osteoarthritis 11/16/2013  . 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 Benign essential hypertension 07/02/2013  . Enthesopathy of hip region 07/02/2013  . Low back pain 07/02/2013    Past Surgical History:  Procedure Laterality Date  . ABDOMINAL HYSTERECTOMY    . BREAST SURGERY Right    mastectomy   . CARDIAC CATHETERIZATION  2012   stent placed  . CATARACT EXTRACTION W/PHACO Right 06/24/2017   Procedure: CATARACT EXTRACTION PHACO AND INTRAOCULAR LENS PLACEMENT ([place]) COMPLICATED RIGHT DIABETIC;  Surgeon: Leandrew Koyanagi, MD;  Location: [place];  Service: Ophthalmology;  Laterality: Right;  Diabetes - oral med  . COLONOSCOPY    .  COLONOSCOPY WITH PROPOFOL N/A 12/08/2017   Procedure: COLONOSCOPY WITH PROPOFOL;  Surgeon: Toledo, Benay Pike, MD;  Location: [facility];  Service: Gastroenterology;  Laterality: N/A;  . CORONARY ANGIOPLASTY    . CORONARY STENT INTERVENTION N/A 02/24/2018   Procedure: CORONARY STENT INTERVENTION;  Surgeon: [name], MD;  Location: [place] [facility];  Service: Cardiovascular;  Laterality: N/A;  . CYSTOSCOPY N/A 09/08/2016   Procedure:  CYSTOSCOPY;  Surgeon: Ward, Honor Loh, MD;  Location: [facility] OR[facility];  Service: Gynecology;  Laterality: N/A;  . EYE SURGERY Left    Cataract Extraction with IOL  . KNEE ARTHROPLASTY Right 03/14/2020   Procedure: COMPUTER ASSISTED TOTAL KNEE ARTHROPLASTY;  Surgeon: Dereck Leep, MD;  Location: [facility] OR[facility];  Service: Orthopedics;  Laterality: Right;  FOLLOWING 1ST CASE  . KNEE ARTHROSCOPY W/ MENISCAL REPAIR Right   . LAPAROSCOPIC BILATERAL SALPINGO OOPHERECTOMY Bilateral 09/08/2016   Procedure: LAPAROSCOPIC BILATERAL SALPINGO OOPHORECTOMY;  Surgeon: Ward, Honor Loh, MD;  Location: [facility] OR[facility];  Service: Gynecology;  Laterality: Bilateral;  . LEFT HEART CATH AND CORONARY ANGIOGRAPHY Left 02/24/2018   Procedure: LEFT HEART CATH AND CORONARY ANGIOGRAPHY;  Surgeon: [name], MD;  Location: [place] [facility];  Service: Cardiovascular;  Laterality: Left;  Marland Kitchen MASTECTOMY Right 2000   BREAST CA    Prior to Admission medications   Medication Sig Start Date End Date Taking? Authorizing Provider  acetaminophen (TYLENOL) 325 MG tablet Take 2 tablets (650 mg total) by mouth every 6 (six) hours as needed for mild pain (or Fever >/= 101). 03/09/18  Yes Gouru, Illene Silver, MD  ALPRAZolam Duanne Moron) 0.25 MG tablet Take 0.25 mg by mouth at bedtime. 03/16/18  Yes [provider]  amLODipine (NORVASC) 2.5 MG tablet Take 2.5 mg by mouth daily.  02/08/20 02/07/21 Yes [provider]  amLODipine (NORVASC) 5 MG tablet Take 5 mg by mouth daily.   Yes [provider]  Biotin 800 MCG TABS Take 800 mcg by mouth daily.    Yes [provider]  cyanocobalamin 1000 MCG tablet Take 1 tablet by mouth daily. 06/06/20 06/06/21 Yes [provider]  hydrOXYzine (ATARAX/VISTARIL) 10 MG tablet Take 10 mg by mouth daily.   Yes [provider]  iron polysaccharides (NIFEREX) 150 MG capsule Take 150 mg by mouth daily. 06/06/20 06/06/21 Yes [provider]  Menthol-Methyl Salicylate (MUSCLE  RUB) 10-15 % CREA Apply 1 application topically as needed for muscle pain.   Yes [provider]  metFORMIN (GLUCOPHAGE) 500 MG tablet Take 500 mg by mouth 2 (two) times daily with a meal.   Yes [provider]  metoprolol succinate (TOPROL-XL) 100 MG 24 hr tablet Take 100 mg by mouth daily.  06/15/19  Yes [provider]  nitroGLYCERIN (NITROSTAT) 0.4 MG SL tablet Place 0.4 mg under the tongue every 5 (five) minutes as needed for chest pain.    Yes [provider]  omeprazole (PRILOSEC) 20 MG capsule Take 20 mg by mouth daily.   Yes [provider]  ondansetron (ZOFRAN-ODT) 4 MG disintegrating tablet Take 4 mg by mouth every 8 (eight) hours as needed. 08/13/20  Yes [provider]  polyethylene glycol (MIRALAX / GLYCOLAX) 17 g packet Take 17 g by mouth daily.   Yes [provider]  Polyvinyl Alcohol-Povidone (MURINE TEARS FOR DRY EYES OP) Apply 1 drop to eye daily as needed (dry eyes).   Yes [provider]  Potassium Chloride ER 20 MEQ TBCR Take 20 mEq by mouth daily.  11/19/18  Yes [provider]  rosuvastatin (CRESTOR) 5 MG tablet Take 5 mg by mouth every Monday, Wednesday, and Friday.  09/28/19 09/27/20 Yes [provider]  Tofacitinib Citrate 5 MG TABS Take 5 mg by mouth 2 (two) times daily.    Yes [provider]  ACCU-CHEK GUIDE test strip  12/08/19   [provider]  Alcohol Swabs (B-D SINGLE USE SWABS REGULAR) PADS  12/20/19   [provider]  APPLE CIDER VINEGAR PO Take 2 tablets by mouth daily. Patient not taking: Reported on 08/14/2020    [provider]  bisacodyl (DULCOLAX) 5 MG EC tablet Take 5 mg by mouth daily.  Patient not taking: Reported on 08/14/2020    [provider]  Blood Glucose Calibration (TRUE METRIX LEVEL 1) Low SOLN  10/24/19   [provider]  celecoxib (CELEBREX) 200 MG capsule Take 1 capsule (200 mg total) by mouth 2 (two) times  daily. Patient not taking: Reported on 08/14/2020 03/16/20   Tamala Julian B, PA-C  enoxaparin (LOVENOX) 40 MG/0.4ML injection Inject 0.4 mLs (40 mg total) into the skin daily for 14 days. 03/16/20 03/30/20  Tamala Julian B, PA-C  oxyCODONE (OXY IR/ROXICODONE) 5 MG immediate release tablet Take 1 tablet (5 mg total) by mouth every 4 (four) hours as needed for moderate pain (pain score 4-6). Patient not taking: Reported on 08/14/2020 03/16/20   Fausto Skillern, PA-C  predniSONE (STERAPRED UNI-PAK 21 TAB) 10 MG (21) TBPK tablet Take 6 tablets on the first day and decrease by 1 tablet each day until finished. Patient not taking: Reported on 08/14/2020 05/06/20   Victorino Dike, FNP  traMADol (ULTRAM) 50 MG tablet Take 1 tablet (50 mg total) by mouth every 4 (four) hours as needed for moderate pain. Patient not taking: Reported on 08/14/2020 03/16/20   Tamala Julian B, PA-C    Allergies Doxazosin, Fish allergy, Dicloxacillin, Iodine, Sulfa antibiotics, Beta adrenergic blockers, Cardura [doxazosin mesylate], Etodolac, Sulfamethoxazole-trimethoprim, Azithromycin, Ciprofloxacin, Clopidogrel, Colchicine, Glucosamine, Leflunomide, Meloxicam, Methotrexate, and Zoloft [sertraline]  Family History  Problem Relation Age of Onset  . Diabetes type II Mother   . Hypertension Mother   . Colon polyps Mother   . Breast cancer Maternal Aunt     Social History Social History   Tobacco Use  . Smoking status: Never Smoker  . Smokeless tobacco: Never Used  Vaping Use  . Vaping Use: Never used  Substance Use Topics  . Alcohol use: No  . Drug use: No    Review of Systems  Constitutional: No fever/chills Eyes: No visual changes. ENT: No sore throat. Respiratory: Denies cough Cardiovascular: Denies chest pain Gastrointestinal: Denies abdominal pain Genitourinary: Positive for dysuria. Musculoskeletal: Negative for back pain. Skin: Negative for rash. Psychiatric: no mood changes,      ____________________________________________   PHYSICAL EXAM:  VITAL SIGNS: ED Triage Vitals  Enc Vitals Group     BP 08/14/20 1050 106/64     Pulse Rate 08/14/20 1050 73     Resp 08/14/20 1050 18     Temp 08/14/20 1051 99.3 F (37.4 C)     Temp Source 08/14/20 1051 Oral     SpO2 08/14/20 1050 98 %     Weight 08/14/20 1051 180 lb (81.6 kg)     Height 08/14/20 1051 5\' 2"  (1.575 m)     Head Circumference --      Peak Flow --      Pain Score --  Pain Loc --      Pain Edu? --      Excl. in [place]? --     Constitutional: Alert and oriented. Well appearing and in no acute distress. Eyes: Conjunctivae are normal.  Head: Atraumatic. Nose: No congestion/rhinnorhea. Mouth/Throat: Mucous membranes are moist.   Neck:  supple no lymphadenopathy noted Cardiovascular: Normal rate, regular rhythm. Heart sounds are normal Respiratory: Normal respiratory effort.  No retractions, lungs c t a  Abd: soft tender in the lower quadrants bilaterally, bs normal all 4 quad, positive CVA tenderness GU: deferred Musculoskeletal: FROM all extremities, warm and well perfused Neurologic:  Normal speech and language.  Skin:  Skin is warm, dry and intact. No rash noted. Psychiatric: Mood and affect are normal. Speech and behavior are normal.  ____________________________________________   LABS (all labs ordered are listed, but only abnormal results are displayed)  Labs Reviewed  CBC WITH DIFFERENTIAL/PLATELET - Abnormal; Notable for the following components:      Result Value   WBC 15.4 (*)    RBC 3.81 (*)    Hemoglobin 10.2 (*)    HCT 29.8 (*)    MCV 78.2 (*)    RDW 18.1 (*)    Neutro Abs 13.3 (*)    Monocytes Absolute 1.1 (*)    All other components within normal limits  COMPREHENSIVE METABOLIC PANEL - Abnormal; Notable for the following components:   Sodium 134 (*)    Chloride 95 (*)    Glucose, Bld 163 (*)    Creatinine, Ser 1.06 (*)    Calcium 8.6 (*)    Albumin 3.3 (*)     Alkaline Phosphatase 37 (*)    GFR, Estimated 55 (*)    All other components within normal limits  LACTIC ACID, PLASMA - Abnormal; Notable for the following components:   Lactic Acid, Venous 2.9 (*)    All other components within normal limits  MAGNESIUM - Abnormal; Notable for the following components:   Magnesium 1.4 (*)    All other components within normal limits  URINALYSIS, COMPLETE (UACMP) WITH MICROSCOPIC - Abnormal; Notable for the following components:   Color, Urine AMBER (*)    APPearance CLOUDY (*)    Hgb urine dipstick MODERATE (*)    Protein, ur 100 (*)    Leukocytes,Ua SMALL (*)    WBC, UA >50 (*)    Bacteria, UA RARE (*)    All other components within normal limits  TROPONIN I (HIGH SENSITIVITY) - Abnormal; Notable for the following components:   Troponin I (High Sensitivity) 25 (*)    All other components within normal limits  RESP PANEL BY RT-PCR (FLU A&B, COVID) ARPGX2  URINE CULTURE  CULTURE, BLOOD (SINGLE)  CULTURE, BLOOD (SINGLE)  LACTIC ACID, PLASMA  LACTIC ACID, PLASMA  CBC WITH DIFFERENTIAL/PLATELET  PROTIME-INR  APTT  TROPONIN I (HIGH SENSITIVITY)   ____________________________________________   ____________________________________________  RADIOLOGY  CT renal Chest x-ray  ____________________________________________   PROCEDURES  Procedure(s) performed: No  .Critical Care Performed by: Lucrezia Starch, MD Authorized by: Lucrezia Starch, MD   Critical care provider statement:    Critical care time (minutes):  15   Critical care was necessary to treat or prevent imminent or life-threatening deterioration of the following conditions:  Sepsis   Critical care was time spent personally by me on the following activities:  Discussions with consultants, evaluation of patient's response to treatment, examination of patient, ordering and performing treatments and interventions, ordering and review of laboratory  studies, ordering and review of  radiographic studies, pulse oximetry, re-evaluation of patient's condition, obtaining history from patient or surrogate and review of old charts      ____________________________________________   INITIAL IMPRESSION / [place] / ED COURSE  Pertinent labs & imaging results that were available during my care of the patient were reviewed by me and considered in my medical decision making (see chart for details).   Patient is a 75 year old female presents with UTI/dizziness.  See HPI.  Physical exam shows patient appears stable at this time.  DDx: Sepsis, pyelonephritis, infected kidney stone, kidney stone, UTI  Imaging and labs ordered at this time EKG shows sinus rhythm, see physician read  CT renal stone Chest x-ray reviewed by me confirmed by radiology to be normal  ----------------------------------------- 1:04 PM on 08/14/2020 -----------------------------------------  Patient's lactic acid is elevated at 2.9 confirming sepsis, fluids and antibiotics will be started.  Urine and urine culture to be obtained prior to starting antibiotics.  WBC is elevated at 15.4, troponin elevated at 25, magnesium is at 1.4,  CT renal stone study shows bladder wall thickening   Consult to hospitalist for admission  Dr. Tobie Poet admitting the patient.  Elaine Cox was evaluated in Emergency Department on 08/14/2020 for the symptoms described in the history of present illness. She was evaluated in the context of the global COVID-19 pandemic, which necessitated consideration that the patient might be at risk for infection with the SARS-CoV-2 virus that causes COVID-19. Institutional protocols and algorithms that pertain to the evaluation of patients at risk for COVID-19 are in a state of rapid change based on information released by regulatory bodies including the CDC and federal and state organizations. These policies and algorithms were followed during the patient's care in the ED.     As part of my medical decision making, I reviewed the following data within the [place] notes reviewed and incorporated, Labs reviewed , EKG interpreted NSR, Old chart reviewed, Radiograph reviewed , Discussed with admitting physician Dr. Tobie Poet, Notes from prior ED visits and [place] Controlled Substance Database  ____________________________________________   FINAL CLINICAL IMPRESSION(S) / ED DIAGNOSES  Final diagnoses:  Acute sepsis ([place])  Pyelonephritis      NEW MEDICATIONS STARTED DURING THIS VISIT:  Current Discharge Medication List       Note:  This document was prepared using Dragon voice recognition software and may include unintentional dictation errors.    Versie Starks, PA-C 08/14/20 1613    Lucrezia Starch, MD 08/14/20 1719    Lucrezia Starch, MD 08/14/20 1725

## 2020-08-14 NOTE — ED Triage Notes (Signed)
Pt arrives via pov from home. Pt c/o hypotension this morning and feel "woozy", lightheaded/dizzy. Reports being treated for UTI at this time, started antibiotic yesterday. Pt hx of hypertension. Took medications (7.5mg  amlodipine) after getting low BP reading at home. States vision is "fuzzy". BP at this time 106/64. Pt a&o x 4, able to stand and pivot onto stretcher. NAD noted at this time.

## 2020-08-14 NOTE — H&P (Addendum)
History and Physical   Elaine Cox ZOX:[id number] DOB: 1946/03/09 DOA: 08/14/2020  PCP: Elaine Harrier, MD  Outpatient Specialists: Dr. Ubaldo Cox Patient coming from: home   I have personally briefly reviewed patient's old medical records in [place].  Chief Concern: Weakness and near syncope  HPI: Elaine Cox is a 75 y.o. female with medical history significant for essential hypertension, recurrent urinary tract infection, NIDDM2, CAD, presents to the emergency department for chief concerns of feeling lightheaded.  She woke up feeling nausea. She denies vomiting. She felt weak and dizzy. She denies poor appetite for the last few years. She endorses 100.3 Tmax at clinic yesterday. She endorses feeling subjective fever at home but did not take temperature. She endorses chills. She denies new cough. She denies weight changes, chest pain, shortness of breath, diarrhea, blood in stool, and hematuria. She denies syncope, and lost of consiciousness.   She endorses suprapubic adominal pain and right side flank pain.   She endorses dysuria on 08/13/20. She endorses frequent urination.   Social history: She lives at home with with daughter, Elaine Cox. She never used tobacco products, etoh, recreational drug use. She is currently retired and formerly was a Social research officer, government.  Vaccinations: she has had two doses of Pfizer vaccines  ROS: Constitutional: no weight change, + fever ENT/Mouth: no sore throat, no rhinorrhea Eyes: no eye pain, no vision changes Cardiovascular: no chest pain, no dyspnea,  no edema, no palpitations Respiratory: no cough, no sputum, no wheezing Gastrointestinal: no nausea, no vomiting, no diarrhea, no constipation Genitourinary: no urinary incontinence, no dysuria, no hematuria Musculoskeletal: no arthralgias, no myalgias Skin: no skin lesions, no pruritus, Neuro: + weakness, no loss of consciousness, no syncope Psych: no anxiety, no depression, +  decrease appetite Heme/Lymph: no bruising, no bleeding  ED Course: Discussed with EDP, patient requiring hospitalization for chief concern of weakness and increased frequency.   Vitals in the emergency department was remarkable for 97.9, respiration rate of 18, pulse of 68, blood pressure 127/50, SPO2 of 98% on room air.  Labs in the emergency department was remarkable for serum sodium of 134, potassium 3.5, chloride 95, bicarb 27, BUN 15, serum creatinine of 1.06, magnesium 1.4, eGFR 55.  High-sensitivity troponin was 25, and then 25. Lactic acid was elevated at 2.9, and decreased to 2.8. WBC was remarkable for 15.4, hemoglobin 10.2, hematocrit 29.8, platelets 348.  COVID PCR/influenza A/influenza B were negative.  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  For chest pain  # Hyperlipidemia-rosuvastatin 5 mg oral, Monday, Wednesday, Friday ordered  # Hypertension-resumed amlodipine 2.5 mg p.o. daily at 08/15/2020, metoprolol succinate 100 mg nightly resumed for 5/70/2022 as patient presents with meeting sepsis  criteria  # Anxiety-resumed home alprazolam 0.25 mg p.o. nightly,  hydroxyzine 10 mg p.o. daily for 08/15/2020  # Noninsulin-dependent diabetes mellitus -metformin 500 mg p.o. BID with meals  # GERD - resumed PPI  Chart reviewed.   08/13/2020: Patient was at [facility] clinic urgent care center: Patient was given doxycycline 100 mg twice daily for 10-day course, ondansetron 4 mg p.o. every 6 hours as needed - Patient states she took 1 dose of doxycycline a.m. prior to presentation  Echo on 03/08/2018: EF showed 55 to 60%  Left heart cath in 02/24/2018: Read as two-vessel coronary artery disease with 75% / 95% stenosis mid LAD and second diagonal branch, 75% stenosis of the distal RCA.  Successful PCI with overlapping DES to the mid LAD, residual 75% stenosis to distal RCA treated medically.  DVT prophylaxis: Enoxaparin 40 mg subcutaneous every 24 hours Code Status: Full code Diet: Heart healthy/carb modified Family Communication: No Disposition Plan: Pending clinical course Consults called: None at this time Admission status: MedSurg, observation, with telemetry  Past Medical History:  Diagnosis Date  . Anginal pain ([place]) 06/2016   being followed by dr. Ubaldo Cox  . Anxiety   . Arthritis    Osteoarthritis  . Arthritis    Rheumatoid  . Breast cancer ([place]) 2000   Right Breast - Chemotherapy  . 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  . Coronary artery disease   . Diabetes mellitus without complication [facility])    Patient takes Metformin  . Dyspnea 03/2017  . GERD (gastroesophageal reflux disease)   . History of abdominal hysterectomy   . History of eyelid surgery   . History of kidney stones   . Hyperlipidemia   . Hypertension   . Lumbago   . Murmur   . Myocardial infarction ([place]) 07/2010  . Orthopnea 03/2017  . Personal history of chemotherapy 2000   right breast ca  . Sinus disorder   . Stroke [facility]) 2006, 2012  . Wears dentures    full upper and lower   Past Surgical History:  Procedure Laterality Date  . ABDOMINAL  HYSTERECTOMY    . BREAST SURGERY Right    mastectomy   . CARDIAC CATHETERIZATION  2012   stent placed  . CATARACT EXTRACTION W/PHACO Right 06/24/2017   Procedure: CATARACT EXTRACTION PHACO AND INTRAOCULAR LENS PLACEMENT ([place]) COMPLICATED RIGHT DIABETIC;  Surgeon: Leandrew Koyanagi, MD;  Location: [place];  Service: Ophthalmology;  Laterality: Right;  Diabetes - oral med  . COLONOSCOPY    . COLONOSCOPY WITH PROPOFOL N/A 12/08/2017   Procedure: COLONOSCOPY WITH PROPOFOL;  Surgeon: Toledo, Benay Pike, MD;  Location: [facility] ENDOSCOPY;  Service: Gastroenterology;  Laterality: N/A;  . CORONARY ANGIOPLASTY    . CORONARY STENT INTERVENTION N/A 02/24/2018   Procedure: CORONARY STENT INTERVENTION;  Surgeon: [name], MD;  Location: [place] CV LAB;  Service: Cardiovascular;  Laterality: N/A;  . CYSTOSCOPY N/A 09/08/2016   Procedure: CYSTOSCOPY;  Surgeon: Ward, Honor Loh, MD;  Location: [facility] ORS;  Service: Gynecology;  Laterality: N/A;  . EYE SURGERY Left    Cataract Extraction with IOL  . KNEE ARTHROPLASTY Right 03/14/2020   Procedure: COMPUTER ASSISTED TOTAL KNEE ARTHROPLASTY;  Surgeon: Dereck Leep, MD;  Location: [facility] ORS;  Service: Orthopedics;  Laterality: Right;  FOLLOWING 1ST CASE  . KNEE ARTHROSCOPY W/ MENISCAL REPAIR Right   . LAPAROSCOPIC BILATERAL SALPINGO OOPHERECTOMY Bilateral 09/08/2016   Procedure: LAPAROSCOPIC BILATERAL SALPINGO OOPHORECTOMY;  Surgeon: Ward, Honor Loh, MD;  Location:  [facility] ORS;  Service: Gynecology;  Laterality: Bilateral;  . LEFT HEART CATH AND CORONARY ANGIOGRAPHY Left 02/24/2018   Procedure: LEFT HEART CATH AND CORONARY ANGIOGRAPHY;  Surgeon: [name], MD;  Location: [place] CV LAB;  Service: Cardiovascular;  Laterality: Left;  Marland Kitchen MASTECTOMY Right 2000   BREAST CA   Social History:  reports that she has never smoked. She has never used smokeless tobacco. She reports that she does not drink alcohol and does not use  drugs.  Allergies  Allergen Reactions  . Doxazosin Other (See Comments)    "headache"  . Fish Allergy Anaphylaxis, Itching, Nausea Only and Swelling    She states her tongue turns black.   . Dicloxacillin Nausea And Vomiting    Has patient had a PCN reaction causing immediate rash, facial/tongue/throat swelling, SOB or lightheadedness with hypotension: No Has patient had a PCN reaction causing severe rash involving mucus membranes or skin necrosis: No Has patient had a PCN reaction that required hospitalization: Unknown Has patient had a PCN reaction occurring within the last 10 years: Unknown If all of the above answers are "NO", then may proceed with Cephalosporin use.  . Iodine Hives    Per patient, she had a previous reaction/hives from an iodine injection  . Sulfa Antibiotics Swelling    Loss of appetite   Constipation bloating Other reaction(s): Headache   . Beta Adrenergic Blockers Other (See Comments)    "Unknown"  . Cardura [Doxazosin Mesylate]     Headache   . Etodolac Itching and Swelling  . Sulfamethoxazole-Trimethoprim Nausea Only  . Azithromycin Itching  . Ciprofloxacin Nausea Only and Rash  . Clopidogrel Nausea And Vomiting  . Colchicine Nausea Only and Other (See Comments)    Dizzness  . Glucosamine Nausea And Vomiting and Other (See Comments)  . Leflunomide Diarrhea and Itching  . Meloxicam Itching  . Methotrexate Nausea Only  . Zoloft [Sertraline] Nausea And Vomiting   Family History  Problem Relation Age of Onset  . Diabetes type II Mother   . Hypertension Mother   . Colon polyps Mother   . Breast cancer Maternal Aunt    Family history: Family history reviewed and not pertinent  Prior to Admission medications   Medication Sig Start Date End Date Taking? Authorizing Provider  ACCU-CHEK GUIDE test strip  12/08/19   [provider]  acetaminophen (TYLENOL) 325 MG tablet Take 2 tablets (650 mg total) by mouth every 6 (six) hours as needed for  mild pain (or Fever >/= 101). 03/09/18   Nicholes Mango, MD  Alcohol Swabs (B-D SINGLE USE SWABS REGULAR) PADS  12/20/19   [provider]  ALPRAZolam Duanne Moron) 0.25 MG tablet Take 0.25 mg by mouth at bedtime. 03/16/18   [provider]  amLODipine (NORVASC) 2.5 MG tablet Take 2.5 mg by mouth daily.  02/08/20 02/07/21  [provider]  APPLE CIDER VINEGAR PO Take 2 tablets by mouth daily.    [provider]  Biotin 800 MCG TABS Take 800 mcg by mouth daily.     [provider]  bisacodyl (DULCOLAX) 5 MG EC tablet Take 5 mg by mouth daily.     [provider]  Blood Glucose Calibration (TRUE METRIX LEVEL 1) Low SOLN  10/24/19   [provider]  celecoxib (CELEBREX) 200 MG capsule Take 1 capsule (200 mg total) by mouth 2 (two) times daily. 03/16/20   Tamala Julian B, PA-C  enoxaparin (LOVENOX) 40 MG/0.4ML injection Inject 0.4 mLs (40 mg  total) into the skin daily for 14 days. 03/16/20 03/30/20  Tamala Julian B, PA-C  hydrOXYzine (ATARAX/VISTARIL) 10 MG tablet Take 10 mg by mouth daily.     [provider]  Menthol-Methyl Salicylate (MUSCLE RUB) 10-15 % CREA Apply 1 application topically as needed for muscle pain.    [provider]  metFORMIN (GLUCOPHAGE) 500 MG tablet Take 500 mg by mouth 2 (two) times daily with a meal.    [provider]  metoprolol succinate (TOPROL-XL) 100 MG 24 hr tablet Take 100 mg by mouth daily.  06/15/19   [provider]  nitroGLYCERIN (NITROSTAT) 0.4 MG SL tablet Place 0.4 mg under the tongue every 5 (five) minutes as needed for chest pain.     [provider]  omeprazole (PRILOSEC) 20 MG capsule Take 20 mg by mouth daily.    [provider]  oxyCODONE (OXY IR/ROXICODONE) 5 MG immediate release tablet Take 1 tablet (5 mg total) by mouth every 4 (four) hours as needed for moderate pain (pain score 4-6). 03/16/20   Fausto Skillern, PA-C  Polyvinyl Alcohol-Povidone  (MURINE TEARS FOR DRY EYES OP) Apply 1 drop to eye daily as needed (dry eyes).    [provider]  Potassium Chloride ER 20 MEQ TBCR Take 20 mEq by mouth daily.  11/19/18   [provider]  predniSONE (STERAPRED UNI-PAK 21 TAB) 10 MG (21) TBPK tablet Take 6 tablets on the first day and decrease by 1 tablet each day until finished. 05/06/20   Sherrie George B, FNP  rosuvastatin (CRESTOR) 5 MG tablet Take 5 mg by mouth every Monday, Wednesday, and Friday.  09/28/19 09/27/20  [provider]  Tofacitinib Citrate (XELJANZ) 5 MG TABS Take 5 mg by mouth 2 (two) times daily.     [provider]  traMADol (ULTRAM) 50 MG tablet Take 1 tablet (50 mg total) by mouth every 4 (four) hours as needed for moderate pain. 03/16/20   Fausto Skillern, PA-C   Physical Exam: Vitals:   08/14/20 1051 08/14/20 1339 08/14/20 1548 08/14/20 2012  BP:  140/76 (!) 127/50 (!) 116/56  Pulse:  68 68 68  Resp:  _0 Temp: 99.3 F (37.4 C)  97.9 F (36.6 C) 99.1 F (37.3 C)  TempSrc: Oral  Oral Oral  SpO2:  98% 98% 98%  Weight: 81.6 kg     Height: _1  (1.575 m)      Constitutional: appears age-appropriate, NAD, calm, comfortable Eyes: PERRL, lids and conjunctivae normal ENMT: Mucous membranes are moist. Posterior pharynx clear of any exudate or lesions. Age-appropriate dentition. Hearing appropriate Neck: normal, supple, no masses, no thyromegaly Respiratory: clear to auscultation bilaterally, no wheezing, no crackles. Normal respiratory effort. No accessory muscle use.  Cardiovascular: Regular rate and rhythm, no murmurs / rubs / gallops. No extremity edema. 2+ pedal pulses. No carotid bruits.  Abdomen: Obese abdomen, no tenderness, no masses palpated, no hepatosplenomegaly. Bowel sounds positive.  Musculoskeletal: no clubbing / cyanosis. No joint deformity upper and lower extremities. Good ROM, no contractures, no atrophy. Normal muscle tone.  Skin: no rashes, lesions, ulcers.  No induration Neurologic: Sensation intact. Strength 5/5 in all 4.  Psychiatric: Normal judgment and insight. Alert and oriented x 3. Normal mood.   EKG: independently reviewed, showing sinus rhythm with rate of 70, QTc 469  Chest x-ray on Admission: I personally reviewed and I agree with radiologist reading as below.  DG Chest 2 View  Result Date: 08/14/2020  CLINICAL DATA:  75 year old female with hypotension.  Near syncope. EXAM: CHEST - 2 VIEW COMPARISON:  Portable chest 05/06/2020 and earlier. FINDINGS: Semi upright AP and lateral views of the chest. Lung volumes and mediastinal contours are stable since 2019 and within normal limits. Visualized tracheal air column is within normal limits. Both lungs appear clear. No pneumothorax or pleural effusion. No acute osseous abnormality identified. Negative visible bowel gas pattern. IMPRESSION: Negative.  No acute cardiopulmonary abnormality. Electronically Signed   By: Genevie Ann M.D.   On: 08/14/2020 11:28   CT Renal Stone Study  Result Date: 08/14/2020 CLINICAL DATA:  Pt c/o hypotension this morning and feel "woozy", lightheaded/dizzy. Reports being treated for UTI at this time, started antibiotic yesterday. EXAM: CT ABDOMEN AND PELVIS WITHOUT CONTRAST TECHNIQUE: Multidetector CT imaging of the abdomen and pelvis was performed following the standard protocol without IV contrast. COMPARISON:  CT abdomen pelvis 07/29/2019 FINDINGS: Lower chest: No acute abnormality. Evaluation of the abdominal viscera limited by the lack of IV contrast. Hepatobiliary: No focal liver abnormality is seen. Normal appearance of the gallbladder. Pancreas: Unremarkable. No surrounding inflammatory changes. Spleen: Normal in size without focal abnormality. Adrenals/Urinary Tract: Adrenal glands are unremarkable. There are several tiny stones in the right kidney. No left renal calculi. No hydronephrosis. There is nonspecific mild right greater than left perinephric stranding.  Urinary bladder appears diffusely thick walled. Stomach/Bowel: Stomach is within normal limits. No evidence of bowel wall thickening, distention, or inflammatory changes. Multiple colonic diverticula without evidence of diverticulitis. Vascular/Lymphatic: Aortic atherosclerosis. Vascular patency cannot be assessed in the absence of IV contrast. No enlarged abdominal or pelvic lymph nodes. Reproductive: Status post hysterectomy. No adnexal masses. Other: No abdominal wall hernia or abnormality. No abdominopelvic ascites. Musculoskeletal: Chronic wedge compression fracture at L4. Degenerative changes in the lower lumbar spine. IMPRESSION: 1. Bladder wall appears diffusely thickened, consistent with history of cystitis. Evaluation for upper tract disease is limited by the lack of IV contrast. There is nonspecific mild right greater than left perinephric stranding. 2. Nonobstructing tiny right renal calculi. No hydronephrosis bilaterally. 3.  Aortic atherosclerosis. Electronically Signed   By: Audie Pinto M.D.   On: 08/14/2020 12:03   Labs on Admission: I have personally reviewed following labs  CBC: Recent Labs  Lab 08/14/20 1207 08/14/20 1611  WBC 15.4* 13.5*  NEUTROABS 13.3* 11.0*  HGB 10.2* 9.5*  HCT 29.8* 27.8*  MCV 78.2* 78.3*  PLT 348 831   Basic Metabolic Panel: Recent Labs  Lab 08/14/20 1207  NA 134*  K 3.5  CL 95*  CO2 27  GLUCOSE 163*  BUN 15  CREATININE 1.06*  CALCIUM 8.6*  MG 1.4*   GFR: Estimated Creatinine Clearance: 46.1 mL/min (A) (by C-G formula based on SCr of 1.06 mg/dL (H)).  Liver Function Tests: Recent Labs  Lab 08/14/20 1207  AST 16  ALT 7  ALKPHOS 37*  BILITOT 1.0  PROT 7.7  ALBUMIN 3.3*   Coagulation Profile: Recent Labs  Lab 08/14/20 1208  INR 1.2   CBG: Recent Labs  Lab 08/14/20 1613  GLUCAP 116*   Urine analysis:    Component Value Date/Time   COLORURINE AMBER (A) 08/14/2020 1207   APPEARANCEUR CLOUDY (A) 08/14/2020 1207    APPEARANCEUR Hazy (A) 09/29/2019 1530   LABSPEC 1.013 08/14/2020 1207   PHURINE 5.0 08/14/2020 [place] 08/14/2020 1207   [place] (A) 08/14/2020 [place] 08/14/2020 1207   BILIRUBINUR Negative 09/29/2019 1530  KETONESUR NEGATIVE 08/14/2020 1207   PROTEINUR 100 (A) 08/14/2020 1207   NITRITE NEGATIVE 08/14/2020 1207   LEUKOCYTESUR SMALL (A) 08/14/2020 1207   Mazi Schuff N Alcus Bradly D.O. [facility]  If 7PM-7AM, please contact overnight-coverage provider If 7AM-7PM, please contact day coverage provider [email or web address]  08/14/2020, 10:13 PM

## 2020-08-15 ENCOUNTER — Encounter: Payer: Self-pay | Admitting: Internal Medicine

## 2020-08-15 DIAGNOSIS — E119 Type 2 diabetes mellitus without complications: Secondary | ICD-10-CM | POA: Diagnosis present

## 2020-08-15 DIAGNOSIS — M1990 Unspecified osteoarthritis, unspecified site: Secondary | ICD-10-CM | POA: Diagnosis present

## 2020-08-15 DIAGNOSIS — I252 Old myocardial infarction: Secondary | ICD-10-CM | POA: Diagnosis not present

## 2020-08-15 DIAGNOSIS — Z8673 Personal history of transient ischemic attack (TIA), and cerebral infarction without residual deficits: Secondary | ICD-10-CM | POA: Diagnosis not present

## 2020-08-15 DIAGNOSIS — E785 Hyperlipidemia, unspecified: Secondary | ICD-10-CM | POA: Diagnosis present

## 2020-08-15 DIAGNOSIS — M069 Rheumatoid arthritis, unspecified: Secondary | ICD-10-CM | POA: Diagnosis present

## 2020-08-15 DIAGNOSIS — K219 Gastro-esophageal reflux disease without esophagitis: Secondary | ICD-10-CM | POA: Diagnosis present

## 2020-08-15 DIAGNOSIS — A419 Sepsis, unspecified organism: Secondary | ICD-10-CM | POA: Diagnosis not present

## 2020-08-15 DIAGNOSIS — I10 Essential (primary) hypertension: Secondary | ICD-10-CM | POA: Diagnosis present

## 2020-08-15 DIAGNOSIS — Z7984 Long term (current) use of oral hypoglycemic drugs: Secondary | ICD-10-CM | POA: Diagnosis not present

## 2020-08-15 DIAGNOSIS — N12 Tubulo-interstitial nephritis, not specified as acute or chronic: Secondary | ICD-10-CM

## 2020-08-15 DIAGNOSIS — R55 Syncope and collapse: Secondary | ICD-10-CM | POA: Diagnosis present

## 2020-08-15 DIAGNOSIS — F419 Anxiety disorder, unspecified: Secondary | ICD-10-CM | POA: Diagnosis present

## 2020-08-15 DIAGNOSIS — Z9221 Personal history of antineoplastic chemotherapy: Secondary | ICD-10-CM | POA: Diagnosis not present

## 2020-08-15 DIAGNOSIS — B370 Candidal stomatitis: Secondary | ICD-10-CM | POA: Diagnosis not present

## 2020-08-15 DIAGNOSIS — Z20822 Contact with and (suspected) exposure to covid-19: Secondary | ICD-10-CM | POA: Diagnosis present

## 2020-08-15 DIAGNOSIS — Z87440 Personal history of urinary (tract) infections: Secondary | ICD-10-CM | POA: Diagnosis not present

## 2020-08-15 DIAGNOSIS — E669 Obesity, unspecified: Secondary | ICD-10-CM | POA: Diagnosis present

## 2020-08-15 DIAGNOSIS — Z853 Personal history of malignant neoplasm of breast: Secondary | ICD-10-CM | POA: Diagnosis not present

## 2020-08-15 DIAGNOSIS — R6520 Severe sepsis without septic shock: Secondary | ICD-10-CM | POA: Diagnosis not present

## 2020-08-15 DIAGNOSIS — Z87442 Personal history of urinary calculi: Secondary | ICD-10-CM | POA: Diagnosis not present

## 2020-08-15 DIAGNOSIS — I2510 Atherosclerotic heart disease of native coronary artery without angina pectoris: Secondary | ICD-10-CM | POA: Diagnosis present

## 2020-08-15 DIAGNOSIS — Z7982 Long term (current) use of aspirin: Secondary | ICD-10-CM | POA: Diagnosis not present

## 2020-08-15 DIAGNOSIS — Z955 Presence of coronary angioplasty implant and graft: Secondary | ICD-10-CM | POA: Diagnosis not present

## 2020-08-15 DIAGNOSIS — Z6832 Body mass index (BMI) 32.0-32.9, adult: Secondary | ICD-10-CM | POA: Diagnosis not present

## 2020-08-15 LAB — BASIC METABOLIC PANEL
Anion gap: 8 (ref 5–15)
BUN: 8 mg/dL (ref 8–23)
CO2: 27 mmol/L (ref 22–32)
Calcium: 8.4 mg/dL — ABNORMAL LOW (ref 8.9–10.3)
Chloride: 103 mmol/L (ref 98–111)
Creatinine, Ser: 0.62 mg/dL (ref 0.44–1.00)
GFR, Estimated: >60 mL/min (ref >60)
Glucose, Bld: 183 mg/dL — ABNORMAL HIGH (ref 70–99)
Potassium: 4 mmol/L (ref 3.5–5.1)
Sodium: 138 mmol/L (ref 135–145)

## 2020-08-15 LAB — CBC WITH DIFFERENTIAL/PLATELET
Abs Immature Granulocytes: 0.03 10*3/uL (ref 0.00–0.07)
Basophils Absolute: 0 10*3/uL (ref 0.0–0.1)
Basophils Relative: 0 %
Eosinophils Absolute: 0.1 10*3/uL (ref 0.0–0.5)
Eosinophils Relative: 2 %
HCT: 26.5 % — ABNORMAL LOW (ref 36.0–46.0)
Hemoglobin: 9.2 g/dL — ABNORMAL LOW (ref 12.0–15.0)
Immature Granulocytes: 0 %
Lymphocytes Relative: 6 %
Lymphs Abs: 0.5 10*3/uL — ABNORMAL LOW (ref 0.7–4.0)
MCH: 27 pg (ref 26.0–34.0)
MCHC: 34.7 g/dL (ref 30.0–36.0)
MCV: 77.7 fL — ABNORMAL LOW (ref 80.0–100.0)
Monocytes Absolute: 1.1 10*3/uL — ABNORMAL HIGH (ref 0.1–1.0)
Monocytes Relative: 12 %
Neutro Abs: 7 10*3/uL (ref 1.7–7.7)
Neutrophils Relative %: 80 %
Platelets: 289 10*3/uL (ref 150–400)
RBC: 3.41 MIL/uL — ABNORMAL LOW (ref 3.87–5.11)
RDW: 18.2 % — ABNORMAL HIGH (ref 11.5–15.5)
WBC: 8.8 10*3/uL (ref 4.0–10.5)
nRBC: 0 % (ref 0.0–0.2)

## 2020-08-15 LAB — LACTIC ACID, PLASMA: Lactic Acid, Venous: 1.4 mmol/L (ref 0.5–1.9)

## 2020-08-15 LAB — PROTIME-INR
INR: 1.1 (ref 0.8–1.2)
Prothrombin Time: 14.3 seconds (ref 11.4–15.2)

## 2020-08-15 MED ORDER — ASPIRIN EC 81 MG PO TBEC
81.0000 mg | DELAYED_RELEASE_TABLET | Freq: Every day | ORAL | Status: DC
  Administered 2020-08-15 – 2020-08-16 (×2): 81 mg via ORAL
  Filled 2020-08-15 (×2): qty 1

## 2020-08-15 MED ORDER — FLUCONAZOLE 50 MG PO TABS
150.0000 mg | ORAL_TABLET | Freq: Once | ORAL | Status: AC
  Administered 2020-08-15: 150 mg via ORAL
  Filled 2020-08-15: qty 1

## 2020-08-15 NOTE — Plan of Care (Signed)

## 2020-08-15 NOTE — Progress Notes (Signed)
Po fluconazole 150 mg x 1 for antibiotics induced yeast infection.  Thank you for allowing me to participate in he care of this patient.

## 2020-08-15 NOTE — Progress Notes (Signed)
Progress Note    Elaine Cox  WLN:[id number] DOB: [birth year]  DOA: 08/14/2020 PCP: Tracie Harrier, MD      Brief Narrative:    Medical records reviewed and are as summarized below:  Elaine Cox is a 75 y.o. female with medical history significant for hypertension, recurrent UTIs, type 2 diabetes mellitus, CAD s/p coronary stent, history of stroke, remote history of breast cancer s/p mastectomy and chemotherapy (2000), who presented to the hospital because of generalized weakness nausea, dizziness/near syncope, dysuria, lower abdominal pain and right flank pain.  She was admitted to the hospital for sepsis secondary to pyelonephritis.    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  (Obesity)   Sepsis secondary to pyelonephritis: Continue empiric IV antibiotics.  Follow-up urine and blood cultures.  CAD s/p coronary stent, history of stroke: Continue aspirin and Crestor.  She said she takes aspirin but it was not listed on her admission med rec.  Other comorbidities include hypertension, diabetes mellitus, remote history of breast cancer    Diet Order            Diet heart healthy/carb modified Room service appropriate? Yes; Fluid consistency: Thin  Diet effective now                    Consultants:  None  Procedures:  None    Medications:   . ALPRAZolam  0.25 mg Oral QHS  . amLODipine  2.5 mg Oral Daily  . enoxaparin (LOVENOX) injection  40 mg Subcutaneous Q24H  . hydrOXYzine  10 mg Oral Daily  . metFORMIN  500 mg Oral BID WC  . metoprolol succinate  100 mg Oral QHS  . pantoprazole  40 mg Oral QAC breakfast  . rosuvastatin  5 mg Oral Q  M,W,F   Continuous Infusions: . cefTRIAXone (ROCEPHIN)  IV 2 g (08/15/20 7408)     Anti-infectives (From admission, onward)   Start     Dose/Rate Route Frequency Ordered Stop   08/15/20 1000  cefTRIAXone (ROCEPHIN) 2 g in sodium chloride 0.9 % 100 mL IVPB        2 g 200 mL/hr over 30 Minutes Intravenous Every 24 hours 08/14/20 1410     08/14/20 1300  cefTRIAXone (ROCEPHIN) 1 g in sodium chloride 0.9 % 100 mL IVPB        1 g 200 mL/hr over 30 Minutes Intravenous  Once 08/14/20 1259 08/14/20 1423             Family Communication/Anticipated D/C date and plan/Code Status   DVT prophylaxis: enoxaparin (LOVENOX) injection 40 mg Start: 08/14/20 2200 Place TED hose Start: 08/14/20 1409     Code Status: Full Code  Family Communication: None Disposition Plan:    Status is: Observation  The patient will require care spanning > 2 midnights and should be moved to inpatient because: IV treatments appropriate due to intensity of illness or inability to take PO  Dispo: The patient is from: Home              Anticipated d/c is to: Home              Patient currently is  not medically stable to d/c.   Difficult to place patient No           Subjective:   C/o general weakness. Abdominal pain and flank pain are a little better.  Objective:    Vitals:   08/14/20 2314 08/15/20 0318 08/15/20 0834 08/15/20 1141  BP: (!) 112/56 130/62 (!) 144/74 (!) 142/74  Pulse: 74 75 73 78  Resp: 18 16 18 18   Temp: 99.1 F (37.3 C) 99.8 F (37.7 C) 98.7 F (37.1 C) 98.7 F (37.1 C)  TempSrc: Oral Oral Oral   SpO2: 96% 95% 97% 100%  Weight:      Height:       No data found.   Intake/Output Summary (Last 24 hours) at 08/15/2020 1445 Last data filed at 08/15/2020 1405 Gross per 24 hour  Intake 2743.36 ml  Output --  Net 2743.36 ml   Filed Weights   08/14/20 1051  Weight: 81.6 kg    Exam:  GEN: NAD SKIN: Warm and dry EYES: EOMI ENT: MMM CV: RRR PULM: CTA B ABD:  soft, obese, NT, +BS CNS: AAO x 3, non focal EXT: No edema or tenderness GU: B/l CVA tenderness        Data Reviewed:   I have personally reviewed following labs and imaging studies:  Labs: Labs show the following:   Basic Metabolic Panel: Recent Labs  Lab 08/14/20 1207 08/15/20 1010  NA 134* 138  K 3.5 4.0  CL 95* 103  CO2 27 27  GLUCOSE 163* 183*  BUN 15 8  CREATININE 1.06* 0.62  CALCIUM 8.6* 8.4*  MG 1.4*  --    GFR Estimated Creatinine Clearance: 61.1 mL/min (by C-G formula based on SCr of 0.62 mg/dL). Liver Function Tests: Recent Labs  Lab 08/14/20 1207  AST 16  ALT 7  ALKPHOS 37*  BILITOT 1.0  PROT 7.7  ALBUMIN 3.3*   No results for input(s): LIPASE, AMYLASE in the last 168 hours. No results for input(s): AMMONIA in the last 168 hours. Coagulation profile Recent Labs  Lab 08/14/20 1208 08/15/20 0334  INR 1.2 1.1    CBC: Recent Labs  Lab 08/14/20 1207 08/14/20 1611 08/15/20 1010  WBC 15.4* 13.5* 8.8  NEUTROABS 13.3* 11.0* 7.0  HGB 10.2* 9.5* 9.2*  HCT 29.8* 27.8* 26.5*  MCV 78.2* 78.3* 77.7*  PLT 348 347 289   Cardiac Enzymes: No results for input(s): CKTOTAL, CKMB, CKMBINDEX, TROPONINI in the last 168 hours. BNP (last 3 results) No results for input(s): PROBNP in the last 8760 hours. CBG: Recent Labs  Lab 08/14/20 1613  GLUCAP 116*   D-Dimer: No results for input(s): DDIMER in the last 72 hours. Hgb A1c: No results for input(s): HGBA1C in the last 72 hours. Lipid Profile: No results for input(s): CHOL, HDL, LDLCALC, TRIG, CHOLHDL, LDLDIRECT in the last 72 hours. Thyroid function studies: No results for input(s): TSH, T4TOTAL, T3FREE, THYROIDAB in the last 72 hours.  Invalid input(s): FREET3 Anemia work up: No results for input(s): VITAMINB12, FOLATE, FERRITIN, TIBC, IRON, RETICCTPCT in the last 72 hours. Sepsis Labs: Recent Labs  Lab 08/14/20 1207 08/14/20 1611 08/14/20 2020 08/15/20 0334 08/15/20 1010  WBC 15.4*  13.5*  --   --  8.8  LATICACIDVEN 2.9* 2.8* 3.3* 1.4  --     Microbiology Recent Results (from the past 240 hour(s))  Resp Panel by RT-PCR (Flu A&B, Covid) Nasopharyngeal Swab     Status: None   Collection Time: 08/14/20  2:24 PM  Specimen: Nasopharyngeal Swab; Nasopharyngeal(NP) swabs in vial transport medium  Result Value Ref Range Status   SARS Coronavirus 2 by RT PCR NEGATIVE NEGATIVE Final    Comment: (NOTE) SARS-CoV-2 target nucleic acids are NOT DETECTED.  The SARS-CoV-2 RNA is generally detectable in upper respiratory specimens during the acute phase of infection. The lowest concentration of SARS-CoV-2 viral copies this assay can detect is 138 copies/mL. A negative result does not preclude SARS-Cov-2 infection and should not be used as the sole basis for treatment or other patient management decisions. A negative result may occur with  improper specimen collection/handling, submission of specimen other than nasopharyngeal swab, presence of viral mutation(s) within the areas targeted by this assay, and inadequate number of viral copies(<138 copies/mL). A negative result must be combined with clinical observations, patient history, and epidemiological information. The expected result is Negative.  Fact Sheet for Patients:  EntrepreneurPulse.com.au  Fact Sheet for Healthcare Providers:  IncredibleEmployment.be  This test is no t yet approved or cleared by the [place] FDA and  has been authorized for detection and/or diagnosis of SARS-CoV-2 by FDA under an Emergency Use Authorization (EUA). This EUA will remain  in effect (meaning this test can be used) for the duration of the COVID-19 declaration under Section 564(b)(1) of the Act, 21 U.S.C.section 360bbb-3(b)(1), unless the authorization is terminated  or revoked sooner.       Influenza A by PCR NEGATIVE NEGATIVE Final   Influenza B by PCR NEGATIVE NEGATIVE Final    Comment:  (NOTE) The Xpert Xpress SARS-CoV-2/FLU/RSV plus assay is intended as an aid in the diagnosis of influenza from Nasopharyngeal swab specimens and should not be used as a sole basis for treatment. Nasal washings and aspirates are unacceptable for Xpert Xpress SARS-CoV-2/FLU/RSV testing.  Fact Sheet for Patients: EntrepreneurPulse.com.au  Fact Sheet for Healthcare Providers: IncredibleEmployment.be  This test is not yet approved or cleared by the [place] FDA and has been authorized for detection and/or diagnosis of SARS-CoV-2 by FDA under an Emergency Use Authorization (EUA). This EUA will remain in effect (meaning this test can be used) for the duration of the COVID-19 declaration under Section 564(b)(1) of the Act, 21 U.S.C. section 360bbb-3(b)(1), unless the authorization is terminated or revoked.  Performed at [facility], [address] [place], [place] [address]   Blood culture (routine single)     Status: None (Preliminary result)   Collection Time: 08/14/20  4:11 PM   Specimen: BLOOD  Result Value Ref Range Status   Specimen Description BLOOD LEFT ANTECUBITAL  Final   Special Requests   Final    BOTTLES DRAWN AEROBIC ONLY Blood Culture adequate volume   Culture   Final    NO GROWTH < 24 HOURS Performed at [facility], [address], [place], [place] [address]    Report Status PENDING  Incomplete    Procedures and diagnostic studies:  DG Chest 2 View  Result Date: 08/14/2020 CLINICAL DATA:  75 year old female with hypotension.  Near syncope. EXAM: CHEST - 2 VIEW COMPARISON:  Portable chest 05/06/2020 and earlier. FINDINGS: Semi upright AP and lateral views of the chest. Lung volumes and mediastinal contours are stable since 2019 and within normal limits. Visualized tracheal air column is within normal limits. Both lungs appear clear. No pneumothorax or pleural effusion. No acute osseous abnormality  identified. Negative visible bowel gas pattern. IMPRESSION: Negative.  No acute cardiopulmonary abnormality. Electronically Signed   By: Genevie Ann M.D.   On: 08/14/2020  11:28   CT Renal Stone Study  Result Date: 08/14/2020 CLINICAL DATA:  Pt c/o hypotension this morning and feel "woozy", lightheaded/dizzy. Reports being treated for UTI at this time, started antibiotic yesterday. EXAM: CT ABDOMEN AND PELVIS WITHOUT CONTRAST TECHNIQUE: Multidetector CT imaging of the abdomen and pelvis was performed following the standard protocol without IV contrast. COMPARISON:  CT abdomen pelvis 07/29/2019 FINDINGS: Lower chest: No acute abnormality. Evaluation of the abdominal viscera limited by the lack of IV contrast. Hepatobiliary: No focal liver abnormality is seen. Normal appearance of the gallbladder. Pancreas: Unremarkable. No surrounding inflammatory changes. Spleen: Normal in size without focal abnormality. Adrenals/Urinary Tract: Adrenal glands are unremarkable. There are several tiny stones in the right kidney. No left renal calculi. No hydronephrosis. There is nonspecific mild right greater than left perinephric stranding. Urinary bladder appears diffusely thick walled. Stomach/Bowel: Stomach is within normal limits. No evidence of bowel wall thickening, distention, or inflammatory changes. Multiple colonic diverticula without evidence of diverticulitis. Vascular/Lymphatic: Aortic atherosclerosis. Vascular patency cannot be assessed in the absence of IV contrast. No enlarged abdominal or pelvic lymph nodes. Reproductive: Status post hysterectomy. No adnexal masses. Other: No abdominal wall hernia or abnormality. No abdominopelvic ascites. Musculoskeletal: Chronic wedge compression fracture at L4. Degenerative changes in the lower lumbar spine. IMPRESSION: 1. Bladder wall appears diffusely thickened, consistent with history of cystitis. Evaluation for upper tract disease is limited by the lack of IV contrast. There is  nonspecific mild right greater than left perinephric stranding. 2. Nonobstructing tiny right renal calculi. No hydronephrosis bilaterally. 3.  Aortic atherosclerosis. Electronically Signed   By: Audie Pinto M.D.   On: 08/14/2020 12:03               LOS: 0 days   Havah Ammon  [facility]   Pager on [email or web address]. If 7PM-7AM, please contact night-coverage at [email or web address]     08/15/2020, 2:45 PM

## 2020-08-15 NOTE — Plan of Care (Signed)
  Problem: Clinical Measurements: Goal: Ability to maintain clinical measurements within normal limits will improve Outcome: Progressing Goal: Will remain free from infection Outcome: Progressing Goal: Diagnostic test results will improve Outcome: Progressing Goal: Respiratory complications will improve Outcome: Progressing Goal: Cardiovascular complication will be avoided Outcome: Progressing   Problem: Pain Managment: Goal: General experience of comfort will improve Outcome: Progressing   Problem: Safety: Goal: Ability to remain free from injury will improve Outcome: Progressing   Problem: Skin Integrity: Goal: Risk for impaired skin integrity will decrease Outcome: Progressing   Pt is involved in and agrees with the plan of care. V/S stable. Denies any pain at this time. Lactic acid- 3.3, oncall provider informed and given IVF bolus - latest lactic acid - 1.4. Voiding well.

## 2020-08-15 NOTE — Progress Notes (Signed)
Patient complains of thrush on her tongue and burning with urination; Elaine Cox, OD notified via secure chat; awaiting acknowledgement and/or orders. [name], RN 9:42 PM; 08/15/2020

## 2020-08-16 DIAGNOSIS — A419 Sepsis, unspecified organism: Principal | ICD-10-CM

## 2020-08-16 DIAGNOSIS — R6520 Severe sepsis without septic shock: Secondary | ICD-10-CM

## 2020-08-16 LAB — URINE CULTURE

## 2020-08-16 MED ORDER — FLUCONAZOLE 150 MG PO TABS: 150.0000 mg | ORAL_TABLET | Freq: Once | ORAL | 0 refills | Status: AC | PRN

## 2020-08-16 MED ORDER — CEFDINIR 300 MG PO CAPS: 300.0000 mg | ORAL_CAPSULE | Freq: Two times a day (BID) | ORAL | 0 refills | Status: DC

## 2020-08-16 MED ORDER — CEPHALEXIN 500 MG PO CAPS: 500.0000 mg | ORAL_CAPSULE | Freq: Four times a day (QID) | ORAL | 0 refills | Status: AC

## 2020-08-16 MED ORDER — ASPIRIN 81 MG PO TBEC: 81.0000 mg | DELAYED_RELEASE_TABLET | Freq: Every day | ORAL | Status: AC

## 2020-08-16 NOTE — Progress Notes (Signed)
[name] to be D/C'd Home per MD order.  Discussed prescriptions and follow up appointments with the patient. Prescriptions given to patient, medication list explained in detail. Pt verbalized understanding.  Allergies as of 08/16/2020      Reactions   Doxazosin Other (See Comments)   "headache"   Fish Allergy Anaphylaxis, Itching, Nausea Only, Swelling   She states her tongue turns black.   Dicloxacillin Nausea And Vomiting   Has patient had a PCN reaction causing immediate rash, facial/tongue/throat swelling, SOB or lightheadedness with hypotension: No Has patient had a PCN reaction causing severe rash involving mucus membranes or skin necrosis: No Has patient had a PCN reaction that required hospitalization: Unknown Has patient had a PCN reaction occurring within the last 10 years: Unknown If all of the above answers are "NO", then may proceed with Cephalosporin use.   Iodine Hives   Per patient, she had a previous reaction/hives from an iodine injection   Sulfa Antibiotics Swelling   Loss of appetite   Constipation bloating Other reaction(s): Headache   Beta Adrenergic Blockers Other (See Comments)   "Unknown"   Cardura [doxazosin Mesylate]    Headache    Etodolac Itching, Swelling   Sulfamethoxazole-trimethoprim Nausea Only   Azithromycin Itching   Ciprofloxacin Nausea Only, Rash   Clopidogrel Nausea And Vomiting   Colchicine Nausea Only, Other (See Comments)   Dizzness   Glucosamine Nausea And Vomiting, Other (See Comments)   Leflunomide Diarrhea, Itching   Meloxicam Itching   Methotrexate Nausea Only   Zoloft [sertraline] Nausea And Vomiting      Medication List    STOP taking these medications   APPLE CIDER VINEGAR PO   bisacodyl 5 MG EC tablet Commonly known as: DULCOLAX   celecoxib 200 MG capsule Commonly known as: CELEBREX   enoxaparin 40 MG/0.4ML injection Commonly known as: LOVENOX   oxyCODONE 5 MG immediate release tablet Commonly known as:  Oxy IR/ROXICODONE   polyethylene glycol 17 g packet Commonly known as: MIRALAX / GLYCOLAX   predniSONE 10 MG (21) Tbpk tablet Commonly known as: STERAPRED UNI-PAK 21 TAB   traMADol 50 MG tablet Commonly known as: ULTRAM     TAKE these medications   Accu-Chek Guide test strip Generic drug: glucose blood   acetaminophen 325 MG tablet Commonly known as: TYLENOL Take 2 tablets (650 mg total) by mouth every 6 (six) hours as needed for mild pain (or Fever >/= 101).   ALPRAZolam 0.25 MG tablet Commonly known as: XANAX Take 0.25 mg by mouth at bedtime.   amLODipine 5 MG tablet Commonly known as: NORVASC Take 5 mg by mouth daily.   amLODipine 2.5 MG tablet Commonly known as: NORVASC Take 2.5 mg by mouth daily.   aspirin 81 MG EC tablet Take 1 tablet (81 mg total) by mouth daily. Swallow whole. Start taking on: Aug 17, 2020   B-D SINGLE USE SWABS REGULAR Pads   Biotin 800 MCG Tabs Take 800 mcg by mouth daily.   cephALEXin 500 MG capsule Commonly known as: KEFLEX Take 1 capsule (500 mg total) by mouth 4 (four) times daily for 11 days.   cyanocobalamin 1000 MCG tablet Take 1 tablet by mouth daily.   fluconazole 150 MG tablet Commonly known as: Diflucan Take 1 tablet (150 mg total) by mouth once as needed for up to 1 day (for oral or vaginal thrush). Start taking on: Aug 17, 2020   hydrOXYzine 10 MG tablet Commonly known as: ATARAX/VISTARIL Take 10 mg  by mouth daily.   iron polysaccharides 150 MG capsule Commonly known as: NIFEREX Take 150 mg by mouth daily.   metFORMIN 500 MG tablet Commonly known as: GLUCOPHAGE Take 500 mg by mouth 2 (two) times daily with a meal.   metoprolol succinate 100 MG 24 hr tablet Commonly known as: TOPROL-XL Take 100 mg by mouth daily.   MURINE TEARS FOR DRY EYES OP Apply 1 drop to eye daily as needed (dry eyes).   Muscle Rub 10-15 % Crea Apply 1 application topically as needed for muscle pain.   nitroGLYCERIN 0.4 MG SL  tablet Commonly known as: NITROSTAT Place 0.4 mg under the tongue every 5 (five) minutes as needed for chest pain.   omeprazole 20 MG capsule Commonly known as: PRILOSEC Take 20 mg by mouth daily.   ondansetron 4 MG disintegrating tablet Commonly known as: ZOFRAN-ODT Take 4 mg by mouth every 8 (eight) hours as needed.   Potassium Chloride ER 20 MEQ Tbcr Take 20 mEq by mouth daily.   rosuvastatin 5 MG tablet Commonly known as: CRESTOR Take 5 mg by mouth every Monday, Wednesday, and Friday.   Tofacitinib Citrate 5 MG Tabs Take 5 mg by mouth 2 (two) times daily.   True Metrix Level 1 Low Soln       Vitals:   08/16/20 0347 08/16/20 0823  BP: (!) 154/75 (!) 143/76  Pulse: 68 65  Resp: 16 18  Temp: 98.8 F (37.1 C) 97.8 F (36.6 C)  SpO2: 96% 98%    Skin clean, dry and intact without evidence of skin break down, no evidence of skin tears noted. IV catheter discontinued intact. Site without signs and symptoms of complications. Dressing and pressure applied. Pt denies pain at this time. No complaints noted.  An After Visit Summary was printed and given to the patient. Patient escorted via [place], and D/C home via private auto.  Elaine Cox

## 2020-08-16 NOTE — Discharge Summary (Addendum)
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  Diet recommendation:  Diet Order            Diet - low sodium heart healthy           Diet Carb Modified                   Code Status: Prior     Hospital Course:    Ms. Elaine Cox is a 75 y.o. 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  She was admitted to the hospital for severe sepsis secondary to pyelonephritis.  She was treated with IV antibiotics, IV fluids and analgesics.  Urine culture showed multiple species and there was no growth on blood culture prior to discharge. Chart review showed that a urine culture obtained on 07/12/2020 (from [facility] system) showed E. coli.  She was discharged on Keflex based on culture and sensitivity report from that urine culture.  E. coli was pansensitive.      Discharge Exam:    Vitals:   08/15/20 2022 08/15/20 2351 08/16/20 0347 08/16/20 0823  BP: 137/75 (!) 159/73 (!) 154/75 (!) 143/76  Pulse: 78 70 68 65   Resp: 16 18 16 18   Temp: 99.3 F (37.4 C) 99.3 F (37.4 C) 98.8 F (37.1 C) 97.8 F (36.6 C)  TempSrc: Oral Oral Oral Oral  SpO2: 97% 95% 96% 98%  Weight:      Height:         GEN: NAD SKIN: Warm and dry EYES: EOMI ENT: MMM CV: RRR PULM: CTA B ABD: soft, obese, NT, +BS CNS: AAO x 3, non focal EXT: No edema or tenderness     The results of significant diagnostics from this hospitalization (including imaging, microbiology, ancillary and laboratory) are listed below for reference.     Procedures and Diagnostic Studies:   DG Chest 2 View  Result Date: 08/14/2020 CLINICAL DATA:  75 year old female with hypotension.  Near syncope. EXAM: CHEST - 2 VIEW COMPARISON:  Portable chest 05/06/2020 and earlier. FINDINGS: Semi upright AP and lateral views of the chest. Lung volumes and mediastinal contours are stable since 2019 and within normal limits. Visualized tracheal air column is within normal limits. Both lungs appear clear. No pneumothorax or pleural effusion. No acute osseous abnormality identified. Negative visible bowel gas pattern. IMPRESSION: Negative.  No acute cardiopulmonary abnormality. Electronically Signed   By: Genevie Ann M.D.   On: 08/14/2020 11:28   CT Renal Stone Study  Result Date: 08/14/2020 CLINICAL DATA:  Pt c/o hypotension this morning and feel "woozy", lightheaded/dizzy. Reports being treated for UTI at this time, started  antibiotic yesterday. EXAM: CT ABDOMEN AND PELVIS WITHOUT CONTRAST TECHNIQUE: Multidetector CT imaging of the abdomen and pelvis was performed following the standard protocol without IV contrast. COMPARISON:  CT abdomen pelvis 07/29/2019 FINDINGS: Lower chest: No acute abnormality. Evaluation of the abdominal viscera limited by the lack of IV contrast. Hepatobiliary: No focal liver abnormality is seen. Normal appearance of the gallbladder. Pancreas: Unremarkable. No surrounding inflammatory changes. Spleen: Normal in size without focal abnormality.  Adrenals/Urinary Tract: Adrenal glands are unremarkable. There are several tiny stones in the right kidney. No left renal calculi. No hydronephrosis. There is nonspecific mild right greater than left perinephric stranding. Urinary bladder appears diffusely thick walled. Stomach/Bowel: Stomach is within normal limits. No evidence of bowel wall thickening, distention, or inflammatory changes. Multiple colonic diverticula without evidence of diverticulitis. Vascular/Lymphatic: Aortic atherosclerosis. Vascular patency cannot be assessed in the absence of IV contrast. No enlarged abdominal or pelvic lymph nodes. Reproductive: Status post hysterectomy. No adnexal masses. Other: No abdominal wall hernia or abnormality. No abdominopelvic ascites. Musculoskeletal: Chronic wedge compression fracture at L4. Degenerative changes in the lower lumbar spine. IMPRESSION: 1. Bladder wall appears diffusely thickened, consistent with history of cystitis. Evaluation for upper tract disease is limited by the lack of IV contrast. There is nonspecific mild right greater than left perinephric stranding. 2. Nonobstructing tiny right renal calculi. No hydronephrosis bilaterally. 3.  Aortic atherosclerosis. Electronically Signed   By: Audie Pinto M.D.   On: 08/14/2020 12:03     Labs:   Basic Metabolic Panel: Recent Labs  Lab 08/14/20 1207 08/15/20 1010  NA 134* 138  K 3.5 4.0  CL 95* 103  CO2 27 27  GLUCOSE 163* 183*  BUN 15 8  CREATININE 1.06* 0.62  CALCIUM 8.6* 8.4*  MG 1.4*  --    GFR Estimated Creatinine Clearance: 61.1 mL/min (by C-G formula based on SCr of 0.62 mg/dL). Liver Function Tests: Recent Labs  Lab 08/14/20 1207  AST 16  ALT 7  ALKPHOS 37*  BILITOT 1.0  PROT 7.7  ALBUMIN 3.3*   No results for input(s): LIPASE, AMYLASE in the last 168 hours. No results for input(s): AMMONIA in the last 168 hours. Coagulation profile Recent Labs  Lab 08/14/20 1208 08/15/20 0334  INR 1.2 1.1     CBC: Recent Labs  Lab 08/14/20 1207 08/14/20 1611 08/15/20 1010  WBC 15.4* 13.5* 8.8  NEUTROABS 13.3* 11.0* 7.0  HGB 10.2* 9.5* 9.2*  HCT 29.8* 27.8* 26.5*  MCV 78.2* 78.3* 77.7*  PLT 348 347 289   Cardiac Enzymes: No results for input(s): CKTOTAL, CKMB, CKMBINDEX, TROPONINI in the last 168 hours. BNP: Invalid input(s): POCBNP CBG: Recent Labs  Lab 08/14/20 1613  GLUCAP 116*   D-Dimer No results for input(s): DDIMER in the last 72 hours. Hgb A1c No results for input(s): HGBA1C in the last 72 hours. Lipid Profile No results for input(s): CHOL, HDL, LDLCALC, TRIG, CHOLHDL, LDLDIRECT in the last 72 hours. Thyroid function studies No results for input(s): TSH, T4TOTAL, T3FREE, THYROIDAB in the last 72 hours.  Invalid input(s): FREET3 Anemia work up No results for input(s): VITAMINB12, FOLATE, FERRITIN, TIBC, IRON, RETICCTPCT in the last 72 hours. Microbiology Recent Results (from the past 240 hour(s))  Urine Culture     Status: Abnormal   Collection Time: 08/14/20 12:07 PM   Specimen: Urine, Random  Result Value Ref Range Status   Specimen Description   Final    URINE, RANDOM Performed at [facility], [address],  [place], [place] [address]    Special Requests   Final    NONE Performed at [facility], [address] [place], [place] [address]    Culture MULTIPLE SPECIES PRESENT, SUGGEST RECOLLECTION (A)  Final   Report Status 08/16/2020 FINAL  Final  Resp Panel by RT-PCR (Flu A&B, Covid) Nasopharyngeal Swab     Status: None   Collection Time: 08/14/20  2:24 PM   Specimen: Nasopharyngeal Swab; Nasopharyngeal(NP) swabs in vial transport medium  Result Value Ref Range Status   SARS Coronavirus 2 by RT PCR NEGATIVE NEGATIVE Final    Comment: (NOTE) SARS-CoV-2 target nucleic acids are NOT DETECTED.  The SARS-CoV-2 RNA is generally detectable in upper respiratory specimens during the acute phase of infection. The lowest concentration  of SARS-CoV-2 viral copies this assay can detect is 138 copies/mL. A negative result does not preclude SARS-Cov-2 infection and should not be used as the sole basis for treatment or other patient management decisions. A negative result may occur with  improper specimen collection/handling, submission of specimen other than nasopharyngeal swab, presence of viral mutation(s) within the areas targeted by this assay, and inadequate number of viral copies(<138 copies/mL). A negative result must be combined with clinical observations, patient history, and epidemiological information. The expected result is Negative.  Fact Sheet for Patients:  EntrepreneurPulse.com.au  Fact Sheet for Healthcare Providers:  IncredibleEmployment.be  This test is no t yet approved or cleared by the [place] FDA and  has been authorized for detection and/or diagnosis of SARS-CoV-2 by FDA under an Emergency Use Authorization (EUA). This EUA will remain  in effect (meaning this test can be used) for the duration of the COVID-19 declaration under Section 564(b)(1) of the Act, 21 U.S.C.section 360bbb-3(b)(1), unless the authorization is terminated  or revoked sooner.       Influenza A by PCR NEGATIVE NEGATIVE Final   Influenza B by PCR NEGATIVE NEGATIVE Final    Comment: (NOTE) The Xpert Xpress SARS-CoV-2/FLU/RSV plus assay is intended as an aid in the diagnosis of influenza from Nasopharyngeal swab specimens and should not be used as a sole basis for treatment. Nasal washings and aspirates are unacceptable for Xpert Xpress SARS-CoV-2/FLU/RSV testing.  Fact Sheet for Patients: EntrepreneurPulse.com.au  Fact Sheet for Healthcare Providers: IncredibleEmployment.be  This test is not yet approved or cleared by the [place] FDA and has been authorized for detection and/or diagnosis of SARS-CoV-2 by FDA under an Emergency Use  Authorization (EUA). This EUA will remain in effect (meaning this test can be used) for the duration of the COVID-19 declaration under Section 564(b)(1) of the Act, 21 U.S.C. section 360bbb-3(b)(1), unless the authorization is terminated or revoked.  Performed at [facility], [address] [place], [place] [address]   Blood culture (routine single)     Status: None (Preliminary result)   Collection Time: 08/14/20  4:11 PM   Specimen: BLOOD  Result Value Ref Range Status   Specimen Description BLOOD LEFT ANTECUBITAL  Final   Special Requests   Final    BOTTLES DRAWN AEROBIC ONLY Blood Culture adequate volume   Culture   Final    NO GROWTH 3 DAYS Performed at [facility], [address] [place],  [address]    Report Status PENDING  Incomplete     Discharge Instructions:   Discharge Instructions    Diet - low sodium heart healthy   Complete by: As directed    Diet Carb Modified   Complete by: As  directed    Increase activity slowly   Complete by: As directed      Allergies as of 08/16/2020      Reactions   Doxazosin Other (See Comments)   "headache"   Fish Allergy Anaphylaxis, Itching, Nausea Only, Swelling   She states her tongue turns black.   Dicloxacillin Nausea And Vomiting   Has patient had a PCN reaction causing immediate rash, facial/tongue/throat swelling, SOB or lightheadedness with hypotension: No Has patient had a PCN reaction causing severe rash involving mucus membranes or skin necrosis: No Has patient had a PCN reaction that required hospitalization: Unknown Has patient had a PCN reaction occurring within the last 10 years: Unknown If all of the above answers are "NO", then may proceed with Cephalosporin use.   Iodine Hives   Per patient, she had a previous reaction/hives from an iodine injection   Sulfa Antibiotics Swelling   Loss of appetite   Constipation bloating Other reaction(s): Headache   Beta Adrenergic Blockers  Other (See Comments)   "Unknown"   Cardura [doxazosin Mesylate]    Headache    Etodolac Itching, Swelling   Sulfamethoxazole-trimethoprim Nausea Only   Azithromycin Itching   Ciprofloxacin Nausea Only, Rash   Clopidogrel Nausea And Vomiting   Colchicine Nausea Only, Other (See Comments)   Dizzness   Glucosamine Nausea And Vomiting, Other (See Comments)   Leflunomide Diarrhea, Itching   Meloxicam Itching   Methotrexate Nausea Only   Zoloft [sertraline] Nausea And Vomiting      Medication List    STOP taking these medications   APPLE CIDER VINEGAR PO   bisacodyl 5 MG EC tablet Commonly known as: DULCOLAX   celecoxib 200 MG capsule Commonly known as: CELEBREX   enoxaparin 40 MG/0.4ML injection Commonly known as: LOVENOX   oxyCODONE 5 MG immediate release tablet Commonly known as: Oxy IR/ROXICODONE   polyethylene glycol 17 g packet Commonly known as: MIRALAX / GLYCOLAX   predniSONE 10 MG (21) Tbpk tablet Commonly known as: STERAPRED UNI-PAK 21 TAB   traMADol 50 MG tablet Commonly known as: ULTRAM     TAKE these medications   Accu-Chek Guide test strip Generic drug: glucose blood   acetaminophen 325 MG tablet Commonly known as: TYLENOL Take 2 tablets (650 mg total) by mouth every 6 (six) hours as needed for mild pain (or Fever >/= 101).   ALPRAZolam 0.25 MG tablet Commonly known as: XANAX Take 0.25 mg by mouth at bedtime.   amLODipine 5 MG tablet Commonly known as: NORVASC Take 5 mg by mouth daily.   amLODipine 2.5 MG tablet Commonly known as: NORVASC Take 2.5 mg by mouth daily.   aspirin 81 MG EC tablet Take 1 tablet (81 mg total) by mouth daily. Swallow whole.   B-D SINGLE USE SWABS REGULAR Pads   Biotin 800 MCG Tabs Take 800 mcg by mouth daily.   cephALEXin 500 MG capsule Commonly known as: KEFLEX Take 1 capsule (500 mg total) by mouth 4 (four) times daily for 11 days.   cyanocobalamin 1000 MCG tablet Take 1 tablet by mouth daily.    fluconazole 150 MG tablet Commonly known as: Diflucan Take 1 tablet (150 mg total) by mouth once as needed for up to 1 day (for oral or vaginal thrush).   hydrOXYzine 10 MG tablet Commonly known as: ATARAX/VISTARIL Take 10 mg by mouth daily.   iron polysaccharides 150 MG capsule Commonly known as: NIFEREX Take 150 mg by mouth daily.   metFORMIN 500 MG tablet  Commonly known as: GLUCOPHAGE Take 500 mg by mouth 2 (two) times daily with a meal.   metoprolol succinate 100 MG 24 hr tablet Commonly known as: TOPROL-XL Take 100 mg by mouth daily.   MURINE TEARS FOR DRY EYES OP Apply 1 drop to eye daily as needed (dry eyes).   Muscle Rub 10-15 % Crea Apply 1 application topically as needed for muscle pain.   nitroGLYCERIN 0.4 MG SL tablet Commonly known as: NITROSTAT Place 0.4 mg under the tongue every 5 (five) minutes as needed for chest pain.   omeprazole 20 MG capsule Commonly known as: PRILOSEC Take 20 mg by mouth daily.   ondansetron 4 MG disintegrating tablet Commonly known as: ZOFRAN-ODT Take 4 mg by mouth every 8 (eight) hours as needed.   Potassium Chloride ER 20 MEQ Tbcr Take 20 mEq by mouth daily.   rosuvastatin 5 MG tablet Commonly known as: CRESTOR Take 5 mg by mouth every Monday, Wednesday, and Friday.   Tofacitinib Citrate 5 MG Tabs Take 5 mg by mouth 2 (two) times daily.   True Metrix Level 1 Low Soln         Time coordinating discharge: 35 minutes  Signed:  Lorah Kalina  [facility] 08/17/2020, 4:47 PM   Pager on [email or web address]. If 7PM-7AM, please contact night-coverage at [email or web address]

## 2020-08-19 LAB — CULTURE, BLOOD (SINGLE)
Culture: NO GROWTH
Special Requests: ADEQUATE

## 2020-08-24 DIAGNOSIS — D649 Anemia, unspecified: Secondary | ICD-10-CM | POA: Diagnosis not present

## 2020-08-24 DIAGNOSIS — M159 Polyosteoarthritis, unspecified: Secondary | ICD-10-CM | POA: Diagnosis not present

## 2020-08-24 DIAGNOSIS — E119 Type 2 diabetes mellitus without complications: Secondary | ICD-10-CM | POA: Diagnosis not present

## 2020-08-24 DIAGNOSIS — M1990 Unspecified osteoarthritis, unspecified site: Secondary | ICD-10-CM | POA: Diagnosis not present

## 2020-08-24 DIAGNOSIS — E538 Deficiency of other specified B group vitamins: Secondary | ICD-10-CM | POA: Diagnosis not present

## 2020-08-24 DIAGNOSIS — M059 Rheumatoid arthritis with rheumatoid factor, unspecified: Secondary | ICD-10-CM | POA: Diagnosis not present

## 2020-08-24 DIAGNOSIS — M069 Rheumatoid arthritis, unspecified: Secondary | ICD-10-CM | POA: Diagnosis not present

## 2020-08-24 DIAGNOSIS — Z09 Encounter for follow-up examination after completed treatment for conditions other than malignant neoplasm: Secondary | ICD-10-CM | POA: Diagnosis not present

## 2020-08-24 DIAGNOSIS — N12 Tubulo-interstitial nephritis, not specified as acute or chronic: Secondary | ICD-10-CM | POA: Diagnosis not present

## 2020-08-30 DIAGNOSIS — Z79899 Other long term (current) drug therapy: Secondary | ICD-10-CM | POA: Diagnosis not present

## 2020-08-30 DIAGNOSIS — M059 Rheumatoid arthritis with rheumatoid factor, unspecified: Secondary | ICD-10-CM | POA: Diagnosis not present

## 2020-09-24 DIAGNOSIS — E1165 Type 2 diabetes mellitus with hyperglycemia: Secondary | ICD-10-CM | POA: Diagnosis not present

## 2020-09-24 DIAGNOSIS — I2510 Atherosclerotic heart disease of native coronary artery without angina pectoris: Secondary | ICD-10-CM | POA: Diagnosis not present

## 2020-09-24 DIAGNOSIS — R0789 Other chest pain: Secondary | ICD-10-CM | POA: Diagnosis not present

## 2020-09-24 DIAGNOSIS — F410 Panic disorder [episodic paroxysmal anxiety] without agoraphobia: Secondary | ICD-10-CM | POA: Diagnosis not present

## 2020-09-24 DIAGNOSIS — E7849 Other hyperlipidemia: Secondary | ICD-10-CM | POA: Diagnosis not present

## 2020-09-24 DIAGNOSIS — I10 Essential (primary) hypertension: Secondary | ICD-10-CM | POA: Diagnosis not present

## 2020-09-24 DIAGNOSIS — I214 Non-ST elevation (NSTEMI) myocardial infarction: Secondary | ICD-10-CM | POA: Diagnosis not present

## 2020-10-12 DIAGNOSIS — I10 Essential (primary) hypertension: Secondary | ICD-10-CM | POA: Diagnosis not present

## 2020-10-12 DIAGNOSIS — E538 Deficiency of other specified B group vitamins: Secondary | ICD-10-CM | POA: Diagnosis not present

## 2020-10-12 DIAGNOSIS — R399 Unspecified symptoms and signs involving the genitourinary system: Secondary | ICD-10-CM | POA: Diagnosis not present

## 2020-10-12 DIAGNOSIS — J019 Acute sinusitis, unspecified: Secondary | ICD-10-CM | POA: Diagnosis not present

## 2020-10-12 DIAGNOSIS — F410 Panic disorder [episodic paroxysmal anxiety] without agoraphobia: Secondary | ICD-10-CM | POA: Diagnosis not present

## 2020-10-12 DIAGNOSIS — I2510 Atherosclerotic heart disease of native coronary artery without angina pectoris: Secondary | ICD-10-CM | POA: Diagnosis not present

## 2020-10-12 DIAGNOSIS — R8290 Unspecified abnormal findings in urine: Secondary | ICD-10-CM | POA: Diagnosis not present

## 2020-10-12 DIAGNOSIS — D649 Anemia, unspecified: Secondary | ICD-10-CM | POA: Diagnosis not present

## 2020-10-12 DIAGNOSIS — E1165 Type 2 diabetes mellitus with hyperglycemia: Secondary | ICD-10-CM | POA: Diagnosis not present

## 2020-10-17 DIAGNOSIS — Z8616 Personal history of COVID-19: Secondary | ICD-10-CM | POA: Diagnosis not present

## 2020-10-17 DIAGNOSIS — K219 Gastro-esophageal reflux disease without esophagitis: Secondary | ICD-10-CM | POA: Diagnosis not present

## 2020-10-17 DIAGNOSIS — Z1389 Encounter for screening for other disorder: Secondary | ICD-10-CM | POA: Diagnosis not present

## 2020-10-17 DIAGNOSIS — B9789 Other viral agents as the cause of diseases classified elsewhere: Secondary | ICD-10-CM | POA: Diagnosis not present

## 2020-10-17 DIAGNOSIS — Z8719 Personal history of other diseases of the digestive system: Secondary | ICD-10-CM | POA: Diagnosis not present

## 2020-10-17 DIAGNOSIS — E119 Type 2 diabetes mellitus without complications: Secondary | ICD-10-CM | POA: Diagnosis not present

## 2020-10-17 DIAGNOSIS — Z0000 Encounter for general adult medical examination without abnormal findings: Secondary | ICD-10-CM | POA: Diagnosis not present

## 2020-10-17 DIAGNOSIS — J40 Bronchitis, not specified as acute or chronic: Secondary | ICD-10-CM | POA: Diagnosis not present

## 2020-10-17 DIAGNOSIS — J028 Acute pharyngitis due to other specified organisms: Secondary | ICD-10-CM | POA: Diagnosis not present

## 2020-10-17 DIAGNOSIS — I10 Essential (primary) hypertension: Secondary | ICD-10-CM | POA: Diagnosis not present

## 2020-10-17 DIAGNOSIS — I2510 Atherosclerotic heart disease of native coronary artery without angina pectoris: Secondary | ICD-10-CM | POA: Diagnosis not present

## 2020-10-17 DIAGNOSIS — I119 Hypertensive heart disease without heart failure: Secondary | ICD-10-CM | POA: Diagnosis not present

## 2020-10-17 DIAGNOSIS — J849 Interstitial pulmonary disease, unspecified: Secondary | ICD-10-CM | POA: Diagnosis not present

## 2020-12-11 DIAGNOSIS — H16223 Keratoconjunctivitis sicca, not specified as Sjogren's, bilateral: Secondary | ICD-10-CM | POA: Diagnosis not present

## 2020-12-11 DIAGNOSIS — Z0100 Encounter for examination of eyes and vision without abnormal findings: Secondary | ICD-10-CM | POA: Diagnosis not present

## 2020-12-17 DIAGNOSIS — M5136 Other intervertebral disc degeneration, lumbar region: Secondary | ICD-10-CM | POA: Diagnosis not present

## 2020-12-17 DIAGNOSIS — M5416 Radiculopathy, lumbar region: Secondary | ICD-10-CM | POA: Diagnosis not present

## 2020-12-17 DIAGNOSIS — M9903 Segmental and somatic dysfunction of lumbar region: Secondary | ICD-10-CM | POA: Diagnosis not present

## 2020-12-17 DIAGNOSIS — M9905 Segmental and somatic dysfunction of pelvic region: Secondary | ICD-10-CM | POA: Diagnosis not present

## 2020-12-19 DIAGNOSIS — M5416 Radiculopathy, lumbar region: Secondary | ICD-10-CM | POA: Diagnosis not present

## 2020-12-19 DIAGNOSIS — M9903 Segmental and somatic dysfunction of lumbar region: Secondary | ICD-10-CM | POA: Diagnosis not present

## 2020-12-19 DIAGNOSIS — M9905 Segmental and somatic dysfunction of pelvic region: Secondary | ICD-10-CM | POA: Diagnosis not present

## 2020-12-19 DIAGNOSIS — M5136 Other intervertebral disc degeneration, lumbar region: Secondary | ICD-10-CM | POA: Diagnosis not present

## 2020-12-27 DIAGNOSIS — I2510 Atherosclerotic heart disease of native coronary artery without angina pectoris: Secondary | ICD-10-CM | POA: Diagnosis not present

## 2020-12-27 DIAGNOSIS — J029 Acute pharyngitis, unspecified: Secondary | ICD-10-CM | POA: Diagnosis not present

## 2020-12-27 DIAGNOSIS — E119 Type 2 diabetes mellitus without complications: Secondary | ICD-10-CM | POA: Diagnosis not present

## 2020-12-27 DIAGNOSIS — H60503 Unspecified acute noninfective otitis externa, bilateral: Secondary | ICD-10-CM | POA: Diagnosis not present

## 2020-12-27 DIAGNOSIS — H60502 Unspecified acute noninfective otitis externa, left ear: Secondary | ICD-10-CM | POA: Diagnosis not present

## 2020-12-27 DIAGNOSIS — E1165 Type 2 diabetes mellitus with hyperglycemia: Secondary | ICD-10-CM | POA: Diagnosis not present

## 2020-12-27 DIAGNOSIS — J028 Acute pharyngitis due to other specified organisms: Secondary | ICD-10-CM | POA: Diagnosis not present

## 2020-12-27 DIAGNOSIS — B9789 Other viral agents as the cause of diseases classified elsewhere: Secondary | ICD-10-CM | POA: Diagnosis not present

## 2020-12-27 DIAGNOSIS — I10 Essential (primary) hypertension: Secondary | ICD-10-CM | POA: Diagnosis not present

## 2020-12-27 DIAGNOSIS — N343 Urethral syndrome, unspecified: Secondary | ICD-10-CM | POA: Diagnosis not present

## 2021-01-01 DIAGNOSIS — M05721 Rheumatoid arthritis with rheumatoid factor of right elbow without organ or systems involvement: Secondary | ICD-10-CM | POA: Diagnosis not present

## 2021-01-01 DIAGNOSIS — M05722 Rheumatoid arthritis with rheumatoid factor of left elbow without organ or systems involvement: Secondary | ICD-10-CM | POA: Diagnosis not present

## 2021-01-01 DIAGNOSIS — Z79899 Other long term (current) drug therapy: Secondary | ICD-10-CM | POA: Diagnosis not present

## 2021-01-03 DIAGNOSIS — E1165 Type 2 diabetes mellitus with hyperglycemia: Secondary | ICD-10-CM | POA: Diagnosis not present

## 2021-01-03 DIAGNOSIS — F419 Anxiety disorder, unspecified: Secondary | ICD-10-CM | POA: Diagnosis not present

## 2021-01-03 DIAGNOSIS — E7849 Other hyperlipidemia: Secondary | ICD-10-CM | POA: Diagnosis not present

## 2021-01-03 DIAGNOSIS — I2510 Atherosclerotic heart disease of native coronary artery without angina pectoris: Secondary | ICD-10-CM | POA: Diagnosis not present

## 2021-01-03 DIAGNOSIS — I214 Non-ST elevation (NSTEMI) myocardial infarction: Secondary | ICD-10-CM | POA: Diagnosis not present

## 2021-01-03 DIAGNOSIS — I10 Essential (primary) hypertension: Secondary | ICD-10-CM | POA: Diagnosis not present

## 2021-01-17 DIAGNOSIS — E119 Type 2 diabetes mellitus without complications: Secondary | ICD-10-CM | POA: Diagnosis not present

## 2021-01-17 DIAGNOSIS — Z23 Encounter for immunization: Secondary | ICD-10-CM | POA: Diagnosis not present

## 2021-01-17 DIAGNOSIS — R0982 Postnasal drip: Secondary | ICD-10-CM | POA: Diagnosis not present

## 2021-01-17 DIAGNOSIS — I10 Essential (primary) hypertension: Secondary | ICD-10-CM | POA: Diagnosis not present

## 2021-01-17 DIAGNOSIS — M0579 Rheumatoid arthritis with rheumatoid factor of multiple sites without organ or systems involvement: Secondary | ICD-10-CM | POA: Diagnosis not present

## 2021-01-17 DIAGNOSIS — Z79899 Other long term (current) drug therapy: Secondary | ICD-10-CM | POA: Diagnosis not present

## 2021-01-17 DIAGNOSIS — Z1231 Encounter for screening mammogram for malignant neoplasm of breast: Secondary | ICD-10-CM | POA: Diagnosis not present

## 2021-01-17 DIAGNOSIS — Z8679 Personal history of other diseases of the circulatory system: Secondary | ICD-10-CM | POA: Diagnosis not present

## 2021-01-17 DIAGNOSIS — M069 Rheumatoid arthritis, unspecified: Secondary | ICD-10-CM | POA: Diagnosis not present

## 2021-01-21 ENCOUNTER — Other Ambulatory Visit: Payer: Self-pay | Admitting: Internal Medicine

## 2021-01-21 DIAGNOSIS — Z1231 Encounter for screening mammogram for malignant neoplasm of breast: Secondary | ICD-10-CM

## 2021-02-08 DIAGNOSIS — R300 Dysuria: Secondary | ICD-10-CM | POA: Diagnosis not present

## 2021-02-08 DIAGNOSIS — N907 Vulvar cyst: Secondary | ICD-10-CM | POA: Diagnosis not present

## 2021-02-08 DIAGNOSIS — N898 Other specified noninflammatory disorders of vagina: Secondary | ICD-10-CM | POA: Diagnosis not present

## 2021-02-08 DIAGNOSIS — N949 Unspecified condition associated with female genital organs and menstrual cycle: Secondary | ICD-10-CM | POA: Diagnosis not present

## 2021-02-12 ENCOUNTER — Ambulatory Visit
Admission: RE | Admit: 2021-02-12 | Discharge: 2021-02-12 | Disposition: A | Discharge status: Home / Self Care | Payer: Medicare HMO | Source: Ambulatory Visit | Attending: Internal Medicine | Admitting: Internal Medicine

## 2021-02-12 ENCOUNTER — Other Ambulatory Visit: Payer: Self-pay

## 2021-02-12 DIAGNOSIS — Z1231 Encounter for screening mammogram for malignant neoplasm of breast: Secondary | ICD-10-CM | POA: Diagnosis not present

## 2021-02-13 ENCOUNTER — Other Ambulatory Visit: Payer: Self-pay

## 2021-02-13 DIAGNOSIS — N200 Calculus of kidney: Secondary | ICD-10-CM

## 2021-02-14 NOTE — Progress Notes (Deleted)
02/15/2021 8:42 AM   Elaine Cox 04-26-1945 [id number]  CC: No chief complaint on file.  Urological history: 1. High risk hematuria -non-smoker -work up with non contrast CT (due to IV contrast allergies) and cysto 2021 - NED -no reports of gross heme -UA ***  2. rUTI's -Contributing factors of age, vaginal atrophy and diabetes -Documented positive urine cultures over the last year  Multiple species Aug 14, 2020  Staph epidermidis December 27, 2020  E. coli Aug 13, 2020  E. coli July 12, 2020   3.  Nephrolithiasis -CT Renal stone study 08/2020 - Nonobstructing tiny right renal calculi    HPI: Elaine Cox is a 75 y.o. female who presents today for a 6 month follow up with KUB and UA.  UA ***   KUB ***  PMH: Past Medical History:  Diagnosis Date   Anginal pain ([place]) 06/2016   being followed by dr. Ubaldo Glassing   Anxiety    Arthritis    Osteoarthritis   Arthritis    Rheumatoid   Breast cancer ([place]) 2000   Right Breast - Chemotherapy   Collagen vascular disease ([place])    Rheumatoid Arthritis.   Coronary artery disease    Diabetes mellitus without complication [facility])    Patient takes Metformin   Dyspnea 03/2017   GERD (gastroesophageal reflux disease)    History of abdominal hysterectomy    History of eyelid surgery    History of kidney stones    Hyperlipidemia    Hypertension    Lumbago    Murmur    Myocardial infarction ([place]) 07/2010   Orthopnea 03/2017   Personal history of chemotherapy 2000   right breast ca   Sinus disorder    Stroke [facility]) 2006, 2012   Wears dentures    full upper and lower    Surgical History: Past Surgical History:  Procedure Laterality Date   ABDOMINAL HYSTERECTOMY     BREAST SURGERY Right    mastectomy    CARDIAC CATHETERIZATION  2012   stent placed   CATARACT EXTRACTION W/PHACO Right 06/24/2017   Procedure: CATARACT EXTRACTION PHACO AND INTRAOCULAR LENS PLACEMENT ([place]) COMPLICATED RIGHT DIABETIC;  Surgeon:  Leandrew Koyanagi, MD;  Location: [place];  Service: Ophthalmology;  Laterality: Right;  Diabetes - oral med   COLONOSCOPY     COLONOSCOPY WITH PROPOFOL N/A 12/08/2017   Procedure: COLONOSCOPY WITH PROPOFOL;  Surgeon: Toledo, Benay Pike, MD;  Location: [facility];  Service: Gastroenterology;  Laterality: N/A;   CORONARY ANGIOPLASTY     CORONARY STENT INTERVENTION N/A 02/24/2018   Procedure: CORONARY STENT INTERVENTION;  Surgeon: [name], MD;  Location: [place] [facility];  Service: Cardiovascular;  Laterality: N/A;   CYSTOSCOPY N/A 09/08/2016   Procedure: CYSTOSCOPY;  Surgeon: Ward, Honor Loh, MD;  Location: [facility] OR[facility];  Service: Gynecology;  Laterality: N/A;   EYE SURGERY Left    Cataract Extraction with IOL   KNEE ARTHROPLASTY Right 03/14/2020   Procedure: COMPUTER ASSISTED TOTAL KNEE ARTHROPLASTY;  Surgeon: Dereck Leep, MD;  Location: [facility] OR[facility];  Service: Orthopedics;  Laterality: Right;  FOLLOWING 1ST CASE   KNEE ARTHROSCOPY W/ MENISCAL REPAIR Right    LAPAROSCOPIC BILATERAL SALPINGO OOPHERECTOMY Bilateral 09/08/2016   Procedure: LAPAROSCOPIC BILATERAL SALPINGO OOPHORECTOMY;  Surgeon: Ward, Honor Loh, MD;  Location: [facility] OR[facility];  Service: Gynecology;  Laterality: Bilateral;   LEFT HEART CATH AND CORONARY ANGIOGRAPHY Left 02/24/2018   Procedure: LEFT HEART CATH AND CORONARY ANGIOGRAPHY;  Surgeon: [name], MD;  Location: [place] [facility];  Service: Cardiovascular;  Laterality: Left;   MASTECTOMY Right 2000   BREAST CA    Home Medications:  Allergies as of 02/15/2021       Reactions   Doxazosin Other (See Comments)   "headache"   Fish Allergy Anaphylaxis, Itching, Nausea Only, Swelling   She states her tongue turns black.   Dicloxacillin Nausea And Vomiting   Has patient had a PCN reaction causing immediate rash, facial/tongue/throat swelling, SOB or lightheadedness with hypotension: No Has patient had a PCN reaction causing severe rash  involving mucus membranes or skin necrosis: No Has patient had a PCN reaction that required hospitalization: Unknown Has patient had a PCN reaction occurring within the last 10 years: Unknown If all of the above answers are "NO", then may proceed with Cephalosporin use.   Iodine Hives   Per patient, she had a previous reaction/hives from an iodine injection   Sulfa Antibiotics Swelling   Loss of appetite   Constipation bloating Other reaction(s): Headache   Beta Adrenergic Blockers Other (See Comments)   "Unknown"   Cardura [doxazosin Mesylate]    Headache    Etodolac Itching, Swelling   Sulfamethoxazole-trimethoprim Nausea Only   Azithromycin Itching   Ciprofloxacin Nausea Only, Rash   Clopidogrel Nausea And Vomiting   Colchicine Nausea Only, Other (See Comments)   Dizzness   Glucosamine Nausea And Vomiting, Other (See Comments)   Leflunomide Diarrhea, Itching   Meloxicam Itching   Methotrexate Nausea Only   Zoloft [sertraline] Nausea And Vomiting        Medication List        Accurate as of February 14, 2021  8:42 AM. If you have any questions, ask your nurse or doctor.          Accu-Chek Guide test strip Generic drug: glucose blood   acetaminophen 325 MG tablet Commonly known as: TYLENOL Take 2 tablets (650 mg total) by mouth every 6 (six) hours as needed for mild pain (or Fever >/= 101).   ALPRAZolam 0.25 MG tablet Commonly known as: XANAX Take 0.25 mg by mouth at bedtime.   amLODipine 5 MG tablet Commonly known as: NORVASC Take 5 mg by mouth daily.   aspirin 81 MG EC tablet Take 1 tablet (81 mg total) by mouth daily. Swallow whole.   B-D SINGLE USE SWABS REGULAR Pads   Biotin 800 MCG Tabs Take 800 mcg by mouth daily.   cyanocobalamin 1000 MCG tablet Take 1 tablet by mouth daily.   hydrOXYzine 10 MG tablet Commonly known as: ATARAX/VISTARIL Take 10 mg by mouth daily.   iron polysaccharides 150 MG capsule Commonly known as: NIFEREX Take 150 mg  by mouth daily.   metFORMIN 500 MG tablet Commonly known as: GLUCOPHAGE Take 500 mg by mouth 2 (two) times daily with a meal.   metoprolol succinate 100 MG 24 hr tablet Commonly known as: TOPROL-XL Take 100 mg by mouth daily.   MURINE TEARS FOR DRY EYES OP Apply 1 drop to eye daily as needed (dry eyes).   Muscle Rub 10-15 % Crea Apply 1 application topically as needed for muscle pain.   nitroGLYCERIN 0.4 MG SL tablet Commonly known as: NITROSTAT Place 0.4 mg under the tongue every 5 (five) minutes as needed for chest pain.   omeprazole 20 MG capsule Commonly known as: PRILOSEC Take 20 mg by mouth daily.   ondansetron 4 MG disintegrating tablet Commonly known as: ZOFRAN-ODT Take 4 mg by mouth every 8 (eight)  hours as needed.   Potassium Chloride ER 20 MEQ Tbcr Take 20 mEq by mouth daily.   rosuvastatin 5 MG tablet Commonly known as: CRESTOR Take 5 mg by mouth every Monday, Wednesday, and Friday.   Tofacitinib Citrate 5 MG Tabs Take 5 mg by mouth 2 (two) times daily.   True Metrix Level 1 Low Soln        Allergies:  Allergies  Allergen Reactions   Doxazosin Other (See Comments)    "headache"   Fish Allergy Anaphylaxis, Itching, Nausea Only and Swelling    She states her tongue turns black.    Dicloxacillin Nausea And Vomiting    Has patient had a PCN reaction causing immediate rash, facial/tongue/throat swelling, SOB or lightheadedness with hypotension: No Has patient had a PCN reaction causing severe rash involving mucus membranes or skin necrosis: No Has patient had a PCN reaction that required hospitalization: Unknown Has patient had a PCN reaction occurring within the last 10 years: Unknown If all of the above answers are "NO", then may proceed with Cephalosporin use.   Iodine Hives    Per patient, she had a previous reaction/hives from an iodine injection   Sulfa Antibiotics Swelling    Loss of appetite   Constipation bloating Other reaction(s):  Headache    Beta Adrenergic Blockers Other (See Comments)    "Unknown"   Cardura [Doxazosin Mesylate]     Headache    Etodolac Itching and Swelling   Sulfamethoxazole-Trimethoprim Nausea Only   Azithromycin Itching   Ciprofloxacin Nausea Only and Rash   Clopidogrel Nausea And Vomiting   Colchicine Nausea Only and Other (See Comments)    Dizzness   Glucosamine Nausea And Vomiting and Other (See Comments)   Leflunomide Diarrhea and Itching   Meloxicam Itching   Methotrexate Nausea Only   Zoloft [Sertraline] Nausea And Vomiting    Family History: Family History  Problem Relation Age of Onset   Diabetes type II Mother    Hypertension Mother    Colon polyps Mother    Breast cancer Maternal Aunt     Social History:   reports that she has never smoked. She has never used smokeless tobacco. She reports that she does not drink alcohol and does not use drugs.  Physical Exam: LMP  (LMP Unknown)   Constitutional:  Well nourished. Alert and oriented, No acute distress. HEENT: [place] AT, moist mucus membranes.  Trachea midline, no masses. Cardiovascular: No clubbing, cyanosis, or edema. Respiratory: Normal respiratory effort, no increased work of breathing. GI: Abdomen is soft, non tender, non distended, no abdominal masses. Liver and spleen not palpable.  No hernias appreciated.  Stool sample for occult testing is not indicated.   GU: No CVA tenderness.  No bladder fullness or masses.  *** external genitalia, *** pubic hair distribution, no lesions.  Normal urethral meatus, no lesions, no prolapse, no discharge.   No urethral masses, tenderness and/or tenderness. No bladder fullness, tenderness or masses. *** vagina mucosa, *** estrogen effect, no discharge, no lesions, *** pelvic support, *** cystocele and *** rectocele noted.  No cervical motion tenderness.  Uterus is freely mobile and non-fixed.  No adnexal/parametria masses or tenderness noted.  Anus and perineum are without rashes or  lesions.   ***  Skin: No rashes, bruises or suspicious lesions. Lymph: No cervical or inguinal adenopathy. Neurologic: Grossly intact, no focal deficits, moving all 4 extremities. Psychiatric: Normal mood and affect.    Laboratory Data: Color Yellow, Violet, Light Violet, Dark Violet Yellow  Clarity Clear, Other SL Cloudy Abnormal    Specific Gravity 1.000 - 1.030 1.020   pH, Urine 5.0 - 8.0 6.0   Protein, Urinalysis Negative, Trace mg/dL Negative   Glucose, Urinalysis Negative mg/dL Negative   Ketones, Urinalysis Negative mg/dL Negative   Blood, Urinalysis Negative Small Abnormal    Nitrite, Urinalysis Negative Negative   Leukocyte Esterase, Urinalysis Negative Large Abnormal    White Blood Cells, Urinalysis None Seen, 0-3 /hpf 10-50 Abnormal    Red Blood Cells, Urinalysis None Seen, 0-3 /hpf 4-10 Abnormal    Bacteria, Urinalysis None Seen /hpf Moderate Abnormal    Squamous Epithelial Cells, Urinalysis Rare, Few, None Seen /hpf Rare   Resulting Agency  [place] - [facility]  Specimen Collected: 02/08/21 14:03 Last Resulted: 02/08/21 15:10  Received From: [place]  Result Received: 02/12/21 10:05   Clue Cells, Vaginal None Seen None Seen   Comment: Whiff test negative.   WBC (White Blood Cells), Vaginal None Seen Few Abnormal    Trichomonas, Vaginal None Seen None Seen   Bacteria, Vaginal None Seen Moderate Abnormal    Yeast, Vaginal None Seen None Seen   Resulting Agency  [place] - [facility]  Specimen Collected: 02/08/21 14:03 Last Resulted: 02/08/21 14:13  Received From: [place]  Result Received: 02/12/21 10:05   WBC (White Blood Cell Count) 4.1 - 10.2 10^3/uL 3.9 Low    RBC (Red Blood Cell Count) 4.04 - 5.48 10^6/uL 5.27   Hemoglobin 12.0 - 15.0 gm/dL 14.5   Hematocrit 35.0 - 47.0 % 41.7   MCV (Mean Corpuscular Volume) 80.0 - 100.0 fl 79.1 Low    MCH (Mean Corpuscular Hemoglobin) 27.0 - 31.2 pg 27.5   MCHC (Mean  Corpuscular Hemoglobin Concentration) 32.0 - 36.0 gm/dL 34.8   Platelet Count 150 - 450 10^3/uL 187   RDW-CV (Red Cell Distribution Width) 11.6 - 14.8 % 16.9 High    MPV (Mean Platelet Volume) 9.4 - 12.4 fl 10.0   Neutrophils 1.50 - 7.80 10^3/uL 2.56   Lymphocytes 1.00 - 3.60 10^3/uL 0.76 Low    Monocytes 0.00 - 1.50 10^3/uL 0.35   Eosinophils 0.00 - 0.55 10^3/uL 0.19   Basophils 0.00 - 0.09 10^3/uL 0.02   Neutrophil % 32.0 - 70.0 % 65.8   Lymphocyte % 10.0 - 50.0 % 19.5   Monocyte % 4.0 - 13.0 % 9.0   Eosinophil % 1.0 - 5.0 % 4.9   Basophil% 0.0 - 2.0 % 0.5   Immature Granulocyte % <=0.7 % 0.3   Immature Granulocyte Count <=0.06 10^3/L 0.01   Resulting Agency  [place] - [facility]  Specimen Collected: 12/27/20 10:20 Last Resulted: 12/27/20 10:24  Received From: [place]  Result Received: 02/12/21 10:05   Glucose 70 - 110 mg/dL 170 High    Sodium 136 - 145 mmol/L 141   Potassium 3.6 - 5.1 mmol/L 4.5   Chloride 97 - 109 mmol/L 103   Carbon Dioxide (CO2) 22.0 - 32.0 mmol/L 31.6   Urea Nitrogen (BUN) 7 - 25 mg/dL 8   Creatinine 0.6 - 1.1 mg/dL 0.7   Glomerular Filtration Rate (eGFR), MDRD Estimate >60 mL/min/1.73sq m 82   Calcium 8.7 - 10.3 mg/dL 9.6   AST  8 - 39 U/L 16   ALT  5 - 38 U/L 9   Alk Phos (alkaline Phosphatase) 34 - 104 U/L 72   Albumin 3.5 - 4.8 g/dL 3.7   Bilirubin, Total 0.3 -  1.2 mg/dL 0.9   Protein, Total 6.1 - 7.9 g/dL 7.1   A/G Ratio 1.0 - 5.0 gm/dL 1.1   Resulting Agency  [place] - [facility]  Specimen Collected: 12/27/20 10:20 Last Resulted: 12/27/20 15:21  Received From: [place]  Result Received: 02/12/21 10:05   Urinalysis ***  I have reviewed the labs.  Pertinent Imaging: N/A    Assessment & Plan:    1. Bilateral nephrolithiasis Today's KUB demonstrates stability of her bilateral nephrolithiasis She will return in 1 year for KUB  2. Vaginal atrophy She is reluctant to start any  vaginal estrogen cream due to her breast cancer history Advised her to use over-the-counter vaginal lubricants are all well for her discomfort  3. High risk hematuria Hematuria work-up completed in May 2021-bilateral nephrolithiasis She does not endorse any gross hematuria UA from Dr. Linton Ham office in October 2021 was negative for microscopic hematuria She will return in 1 year for recheck on UA and report any episodes of gross hematuria in the interim  No follow-ups on file.  Zara Council, PA-C  [facility] [address], [place], [place] [address] [phone]

## 2021-02-15 ENCOUNTER — Ambulatory Visit: Payer: Self-pay | Admitting: Urology

## 2021-02-15 DIAGNOSIS — R3129 Other microscopic hematuria: Secondary | ICD-10-CM

## 2021-02-15 DIAGNOSIS — N952 Postmenopausal atrophic vaginitis: Secondary | ICD-10-CM

## 2021-02-15 DIAGNOSIS — N200 Calculus of kidney: Secondary | ICD-10-CM

## 2021-02-19 NOTE — Progress Notes (Signed)
03/17/21 2:56 PM   Elaine Cox 1945-10-01 [id number]  Referring provider:  Tracie Harrier, MD [address] [facility] [place],  [place] [address]  Chief Complaint  Patient presents with   Follow-up   Urological history  Nephrolithiasis  - 02/10/2020 KUB showed stability of bilateral nephrolithiasis - Recent CT on 08/14/2020 revealed Bladder wall appears diffusely thickened, consistent with history of cystitis. Evaluation for upper tract disease is limited by the lack of IV contrast. There is nonspecific mild right greater than left perinephric stranding. Nonobstructing tiny right renal calculi. No hydronephrosis bilaterally. - KUB   2. Vaginal atrophy  - Managed on OTC vaginal lubricants   3. High risk hematuria  - Hematuria work-up completed in May 2021-bilateral nephrolithiasis - Recent urinalysis on 02/08/2021 showed small blood and 4-10 RBCs.    HPI: Elaine Cox is a 75 y.o.female with a personal history of nephrolithiasis, vaginal atrophy and high risk hematuria.    She reports that she just finished a dose of Keflex for a bladder infection .She was experiencing, nausea, burning, and vomiting that went away after Keflex.  She reports that she drinks cranberry juice, orange juice, and that she tries to drink an adequate amount of water.   She denies frequency, urgency, and burning. She denies using any estrogen cream considering she has a history of cancer.   She reports that she does experience constipation.   PMH: Past Medical History:  Diagnosis Date   Anginal pain ([place]) 06/2016   being followed by dr. 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   Coronary artery disease    Diabetes mellitus without complication [facility])    Patient takes Metformin   Dyspnea 03/2017   GERD  (gastroesophageal reflux disease)    History of abdominal hysterectomy    History of eyelid surgery    History of kidney stones    Hyperlipidemia    Hypertension    Lumbago    Murmur    Myocardial infarction ([place]) 07/2010   Orthopnea 03/2017   Personal history of chemotherapy 2000   right breast ca   Sinus disorder    Stroke [facility]) 2006, 2012   Wears dentures    full upper and lower    Surgical History: Past Surgical History:  Procedure Laterality Date   ABDOMINAL HYSTERECTOMY     BREAST SURGERY Right    mastectomy    CARDIAC CATHETERIZATION  2012   stent placed   CATARACT EXTRACTION W/PHACO Right 06/24/2017   Procedure: CATARACT EXTRACTION PHACO AND INTRAOCULAR LENS PLACEMENT ([place]) COMPLICATED RIGHT DIABETIC;  Surgeon: Leandrew Koyanagi, MD;  Location: [place];  Service: Ophthalmology;  Laterality: Right;  Diabetes - oral med   COLONOSCOPY     COLONOSCOPY WITH PROPOFOL N/A 12/08/2017   Procedure: COLONOSCOPY WITH PROPOFOL;  Surgeon: Toledo, Benay Pike, MD;  Location: [facility];  Service: Gastroenterology;  Laterality: N/A;   CORONARY ANGIOPLASTY     CORONARY STENT INTERVENTION N/A 02/24/2018   Procedure: CORONARY STENT INTERVENTION;  Surgeon: [name], MD;  Location: [place] [facility];  Service: Cardiovascular;  Laterality: N/A;   CYSTOSCOPY N/A 09/08/2016   Procedure: CYSTOSCOPY;  Surgeon: Ward, Honor Loh, MD;  Location: [facility];  Service: Gynecology;  Laterality: N/A;   EYE SURGERY Left  Cataract Extraction with IOL   KNEE ARTHROPLASTY Right 03/14/2020   Procedure: COMPUTER ASSISTED TOTAL KNEE ARTHROPLASTY;  Surgeon: Dereck Leep, MD;  Location: [facility];  Service: Orthopedics;  Laterality: Right;  FOLLOWING 1ST CASE   KNEE ARTHROSCOPY W/ MENISCAL REPAIR Right    LAPAROSCOPIC BILATERAL SALPINGO OOPHERECTOMY Bilateral 09/08/2016   Procedure: LAPAROSCOPIC BILATERAL SALPINGO OOPHORECTOMY;  Surgeon: Ward, Honor Loh, MD;  Location: [facility] OR[facility];   Service: Gynecology;  Laterality: Bilateral;   LEFT HEART CATH AND CORONARY ANGIOGRAPHY Left 02/24/2018   Procedure: LEFT HEART CATH AND CORONARY ANGIOGRAPHY;  Surgeon: [name], MD;  Location: [place] CV LAB;  Service: Cardiovascular;  Laterality: Left;   MASTECTOMY Right 2000   BREAST CA    Home Medications:  Allergies as of 02/20/2021       Reactions   Doxazosin Other (See Comments)   "headache"   Fish Allergy Anaphylaxis, Itching, Nausea Only, Swelling   She states her tongue turns black.   Dicloxacillin Nausea And Vomiting   Has patient had a PCN reaction causing immediate rash, facial/tongue/throat swelling, SOB or lightheadedness with hypotension: No Has patient had a PCN reaction causing severe rash involving mucus membranes or skin necrosis: No Has patient had a PCN reaction that required hospitalization: Unknown Has patient had a PCN reaction occurring within the last 10 years: Unknown If all of the above answers are "NO", then may proceed with Cephalosporin use.   Iodine Hives   Per patient, she had a previous reaction/hives from an iodine injection   Sulfa Antibiotics Swelling   Loss of appetite   Constipation bloating Other reaction(s): Headache   Beta Adrenergic Blockers Other (See Comments)   "Unknown"   Cardura [doxazosin Mesylate]    Headache    Etodolac Itching, Swelling   Sulfamethoxazole-trimethoprim Nausea Only   Azithromycin Itching   Ciprofloxacin Nausea Only, Rash   Clopidogrel Nausea And Vomiting   Colchicine Nausea Only, Other (See Comments)   Dizzness   Glucosamine Nausea And Vomiting, Other (See Comments)   Leflunomide Diarrhea, Itching   Meloxicam Itching   Methotrexate Nausea Only   Zoloft [sertraline] Nausea And Vomiting        Medication List        Accurate as of February 20, 2021 11:59 PM. If you have any questions, ask your nurse or doctor.          STOP taking these medications    ondansetron 4 MG  disintegrating tablet Commonly known as: ZOFRAN-ODT Stopped by: Lyndal Alamillo, PA-C       TAKE these medications    Accu-Chek Guide test strip Generic drug: glucose blood   acetaminophen 325 MG tablet Commonly known as: TYLENOL Take 2 tablets (650 mg total) by mouth every 6 (six) hours as needed for mild pain (or Fever >/= 101).   ALPRAZolam 0.25 MG tablet Commonly known as: XANAX Take 0.25 mg by mouth at bedtime.   amLODipine 5 MG tablet Commonly known as: NORVASC Take 5 mg by mouth daily.   aspirin 81 MG EC tablet Take 1 tablet (81 mg total) by mouth daily. Swallow whole.   B-D SINGLE USE SWABS REGULAR Pads   Biotin 800 MCG Tabs Take 800 mcg by mouth daily.   cyanocobalamin 1000 MCG tablet Take 1 tablet by mouth daily.   hydrOXYzine 10 MG tablet Commonly known as: ATARAX Take 10 mg by mouth daily.   iron polysaccharides 150 MG capsule Commonly known as: NIFEREX Take 150 mg by mouth daily.  metFORMIN 500 MG tablet Commonly known as: GLUCOPHAGE Take 500 mg by mouth 2 (two) times daily with a meal.   metoprolol succinate 100 MG 24 hr tablet Commonly known as: TOPROL-XL Take 100 mg by mouth daily.   MURINE TEARS FOR DRY EYES OP Apply 1 drop to eye daily as needed (dry eyes).   Muscle Rub 10-15 % Crea Apply 1 application topically as needed for muscle pain.   nitroGLYCERIN 0.4 MG SL tablet Commonly known as: NITROSTAT Place 0.4 mg under the tongue every 5 (five) minutes as needed for chest pain.   omeprazole 20 MG capsule Commonly known as: PRILOSEC Take 20 mg by mouth daily.   Potassium Chloride ER 20 MEQ Tbcr Take 20 mEq by mouth daily.   rosuvastatin 5 MG tablet Commonly known as: CRESTOR Take 5 mg by mouth every Monday, Wednesday, and Friday.   Tofacitinib Citrate 5 MG Tabs Take 5 mg by mouth 2 (two) times daily.   True Metrix Level 1 Low Soln        Allergies:  Allergies  Allergen Reactions   Doxazosin Other (See Comments)     "headache"   Fish Allergy Anaphylaxis, Itching, Nausea Only and Swelling    She states her tongue turns black.    Dicloxacillin Nausea And Vomiting    Has patient had a PCN reaction causing immediate rash, facial/tongue/throat swelling, SOB or lightheadedness with hypotension: No Has patient had a PCN reaction causing severe rash involving mucus membranes or skin necrosis: No Has patient had a PCN reaction that required hospitalization: Unknown Has patient had a PCN reaction occurring within the last 10 years: Unknown If all of the above answers are "NO", then may proceed with Cephalosporin use.   Iodine Hives    Per patient, she had a previous reaction/hives from an iodine injection   Sulfa Antibiotics Swelling    Loss of appetite   Constipation bloating Other reaction(s): Headache    Beta Adrenergic Blockers Other (See Comments)    "Unknown"   Cardura [Doxazosin Mesylate]     Headache    Etodolac Itching and Swelling   Sulfamethoxazole-Trimethoprim Nausea Only   Azithromycin Itching   Ciprofloxacin Nausea Only and Rash   Clopidogrel Nausea And Vomiting   Colchicine Nausea Only and Other (See Comments)    Dizzness   Glucosamine Nausea And Vomiting and Other (See Comments)   Leflunomide Diarrhea and Itching   Meloxicam Itching   Methotrexate Nausea Only   Zoloft [Sertraline] Nausea And Vomiting    Family History: Family History  Problem Relation Age of Onset   Diabetes type II Mother    Hypertension Mother    Colon polyps Mother    Breast cancer Maternal Aunt     Social History:  reports that she has never smoked. She has never used smokeless tobacco. She reports that she does not drink alcohol and does not use drugs.   Physical Exam: BP (!) 145/78   Pulse (!) 59   Ht 5\' 2"  (1.575 m)   Wt 180 lb (81.6 kg)   LMP  (LMP Unknown)   BMI 32.92 kg/m   Constitutional:  Alert and oriented, No acute distress. HEENT: [place] AT, moist mucus membranes.  Trachea midline, no  masses. Cardiovascular: No clubbing, cyanosis, or edema. Respiratory: Normal respiratory effort, no increased work of breathing. GI: Abdomen is soft, nontender, nondistended, no abdominal masses GU: No CVA tenderness Lymph: No cervical or inguinal lymphadenopathy. Skin: No rashes, bruises or suspicious lesions. Neurologic: Grossly  intact, no focal deficits, moving all 4 extremities. Psychiatric: Normal mood and affect.  Laboratory Data:  Lab Results  Component Value Date   CREATININE 0.62 08/15/2020    Ref Range & Units 1 mo ago  WBC (White Blood Cell Count) 4.1 - 10.2 10^3/uL 3.9 Low    RBC (Red Blood Cell Count) 4.04 - 5.48 10^6/uL 5.27   Hemoglobin 12.0 - 15.0 gm/dL 14.5   Hematocrit 35.0 - 47.0 % 41.7   MCV (Mean Corpuscular Volume) 80.0 - 100.0 fl 79.1 Low    MCH (Mean Corpuscular Hemoglobin) 27.0 - 31.2 pg 27.5   MCHC (Mean Corpuscular Hemoglobin Concentration) 32.0 - 36.0 gm/dL 34.8   Platelet Count 150 - 450 10^3/uL 187   RDW-CV (Red Cell Distribution Width) 11.6 - 14.8 % 16.9 High    MPV (Mean Platelet Volume) 9.4 - 12.4 fl 10.0   Neutrophils 1.50 - 7.80 10^3/uL 2.56   Lymphocytes 1.00 - 3.60 10^3/uL 0.76 Low    Monocytes 0.00 - 1.50 10^3/uL 0.35   Eosinophils 0.00 - 0.55 10^3/uL 0.19   Basophils 0.00 - 0.09 10^3/uL 0.02   Neutrophil % 32.0 - 70.0 % 65.8   Lymphocyte % 10.0 - 50.0 % 19.5   Monocyte % 4.0 - 13.0 % 9.0   Eosinophil % 1.0 - 5.0 % 4.9   Basophil% 0.0 - 2.0 % 0.5   Immature Granulocyte % <=0.7 % 0.3   Immature Granulocyte Count <=0.06 10^3/L 0.01   Resulting Agency  [place] - LAB  Specimen Collected: 12/27/20 10:20 Last Resulted: 12/27/20 10:24  Received From: [place]  Result Received: 02/12/21 10:05    Ref Range & Units 1 mo ago  Cholesterol, Total 100 - 200 mg/dL 178   Triglyceride 35 - 199 mg/dL 105   HDL (High Density Lipoprotein) Cholesterol 35.0 - 85.0 mg/dL 41.3   LDL Calculated 0 - 130 mg/dL 116   VLDL  Cholesterol mg/dL 21   Cholesterol/HDL Ratio  4.3   Resulting Agency  [place] - LAB  Specimen Collected: 12/27/20 10:20 Last Resulted: 12/27/20 15:19  Received From: [place]  Result Received: 01/01/21 16:30    Ref Range & Units 1 mo ago  Creatinine, Random Urine 37.0 - 250.0 mg/dL 206.6   Urine Albumin, Random mg/L 62   Urine Albumin/Creatinine Ratio <30.0 ug/mg 30.0 High    Comment: Urine:         Spot collection               (g/mg creatinine)      Normal               < 30    Moderately          30-299          increased    Clinical             >=300  albuminuria  Resulting Agency  [place] - LAB  Specimen Collected: 12/27/20 10:20 Last Resulted: 12/27/20 13:01  Received From: [place]  Result Received: 02/12/21 10:05    Ref Range & Units 1 mo ago  Thyroid Stimulating Hormone (TSH) 0.450-5.330 uIU/ml uIU/mL 2.791   Comment: Reference Range for Pregnant Females >= 70 yrs old:  Normal Range for 1st trimester: 0.05-3.70 ulU/ml  Normal Range for 2nd trimester: 0.31-4.35 ulU/ml  Resulting Agency  [place] - LAB  Specimen Collected: 12/27/20 10:20 Last Resulted: 12/27/20 13:00  Received From: [place]  Result Received: 01/01/21 16:30   Lab Results  Component Value Date   HGBA1C 6.6 (H) 03/07/2020    Urinalysis Component     Latest Ref Rng & Units 02/20/2021  Specific Gravity, UA     1.005 - 1.030 >1.030 (H)  pH, UA     5.0 - 7.5 6.0  Color, UA     Yellow Yellow  Appearance Ur     Clear Hazy (A)  Leukocytes,UA     Negative Trace (A)  Protein,UA     Negative/Trace 1+ (A)  Glucose, UA     Negative Negative  Ketones, UA     Negative Negative  RBC, UA     Negative Trace (A)  Bilirubin, UA     Negative Negative  Urobilinogen, Ur     0.2 - 1.0 mg/dL 0.2  Nitrite, UA     Negative Negative  Microscopic Examination      See below:   Component     Latest Ref  Rng & Units 02/20/2021          WBC, UA     0 - 5 /hpf 11-30 (A)  RBC     0 - 2 /hpf 0-2  Epithelial Cells (non renal)     0 - 10 /hpf >10 (A)  Renal Epithel, UA     None seen /hpf 0-10 (A)  Casts     None seen /lpf Present (A)  Cast Type     N/A Hyaline casts  Mucus, UA     Not Estab. Present (A)  Bacteria, UA     None seen/Few Few  I have reviewed the labs.   Pertinent Imaging: CLINICAL DATA:  Pt c/o hypotension this morning and feel "woozy", lightheaded/dizzy. Reports being treated for UTI at this time, started antibiotic yesterday.   EXAM: CT ABDOMEN AND PELVIS WITHOUT CONTRAST   TECHNIQUE: Multidetector CT imaging of the abdomen and pelvis was performed following the standard protocol without IV contrast.   COMPARISON:  CT abdomen pelvis 07/29/2019   FINDINGS: Lower chest: No acute abnormality.   Evaluation of the abdominal viscera limited by the lack of IV contrast.   Hepatobiliary: No focal liver abnormality is seen. Normal appearance of the gallbladder.   Pancreas: Unremarkable. No surrounding inflammatory changes.   Spleen: Normal in size without focal abnormality.   Adrenals/Urinary Tract: Adrenal glands are unremarkable. There are several tiny stones in the right kidney. No left renal calculi. No hydronephrosis. There is nonspecific mild right greater than left perinephric stranding. Urinary bladder appears diffusely thick walled.   Stomach/Bowel: Stomach is within normal limits. No evidence of bowel wall thickening, distention, or inflammatory changes. Multiple colonic diverticula without evidence of diverticulitis.   Vascular/Lymphatic: Aortic atherosclerosis. Vascular patency cannot be assessed in the absence of IV contrast. No enlarged abdominal or pelvic lymph nodes.   Reproductive: Status post hysterectomy. No adnexal masses.   Other: No abdominal wall hernia or abnormality. No abdominopelvic ascites.   Musculoskeletal: Chronic wedge  compression fracture at L4. Degenerative changes in the lower lumbar spine.   IMPRESSION: 1. Bladder wall appears diffusely thickened, consistent with history of cystitis. Evaluation for upper tract disease is limited by the lack of IV contrast. There is nonspecific mild right greater than left perinephric stranding.   2. Nonobstructing tiny right renal calculi. No hydronephrosis bilaterally.   3.  Aortic atherosclerosis.     Electronically Signed   By: Jac Canavan.D.  On: 08/14/2020 12:03  CLINICAL DATA:  Nephrolithiasis   EXAM: ABDOMEN - 1 VIEW   COMPARISON:  CT 08/14/2020   FINDINGS: Multiple calcifications overlying the upper pole of the left kidney likely represent vascular calcifications within the splenic artery. A 14 mm calcification, however, has developed overlying the lower pole of the left kidney. Several punctate calculi overlie the interpolar region of the right kidney measuring 1-2 mm in dimension, stable since prior examination. No ureteral calculi. Multiple phleboliths are seen within the pelvis as well as vascular calcifications.   Normal abdominal gas pattern. No organomegaly. No acute bone abnormality.   IMPRESSION: Interval development of 14 mm calcification overlying the lower pole of the left kidney. Stable mild right nephrolithiasis. No definite urolithiasis.     Electronically Signed   By: Fidela Salisbury M.D.   On: 02/21/2021 01:55  I have independently reviewed the films.  Comparing KUB with CT dated 08/14/2020, I feel the calcification on the left is too medial to be a lower pole stone.    Assessment & Plan:    Right sided nephrolithiasis  - stable will continue to monitor yearly with KUB  Recurrent UTIs - Counseled her in UTI prevention supplements such as cranberry tablets, probiotics, yogurt that has active lactobacillus culture, and d-mannose  - RTC in 3 months or sooner if she develops signs of UTI.   Return in 3  months for recheck of symptoms   [facility] [address], [place], [place] [address] [phone]  [facility] as a scribe for [facility], PA-C.,have documented all relevant documentation on the behalf of Shari Natt, PA-C,as directed by  [facility], PA-C while in the presence of [place], PA-C.  I have reviewed the above documentation for accuracy and completeness, and I agree with the above.    Zara Council, PA-C

## 2021-02-20 ENCOUNTER — Ambulatory Visit: Payer: Medicare HMO | Admitting: Urology

## 2021-02-20 ENCOUNTER — Ambulatory Visit
Admission: RE | Admit: 2021-02-20 | Discharge: 2021-02-20 | Disposition: A | Discharge status: Home / Self Care | Payer: Medicare HMO | Source: Ambulatory Visit | Attending: Urology | Admitting: Urology

## 2021-02-20 ENCOUNTER — Other Ambulatory Visit: Payer: Self-pay

## 2021-02-20 ENCOUNTER — Encounter: Payer: Self-pay | Admitting: Urology

## 2021-02-20 VITALS — BP 145/78 | HR 59 | Ht 62.0 in | Wt 180.0 lb

## 2021-02-20 DIAGNOSIS — N200 Calculus of kidney: Secondary | ICD-10-CM

## 2021-02-20 DIAGNOSIS — N390 Urinary tract infection, site not specified: Secondary | ICD-10-CM | POA: Diagnosis not present

## 2021-02-20 DIAGNOSIS — I878 Other specified disorders of veins: Secondary | ICD-10-CM | POA: Diagnosis not present

## 2021-02-20 LAB — MICROSCOPIC EXAMINATION: Epithelial Cells (non renal): >10 /hpf — AB (ref 0–10)

## 2021-02-20 LAB — URINALYSIS, COMPLETE
Bilirubin, UA: NEGATIVE
Glucose, UA: NEGATIVE
Ketones, UA: NEGATIVE
Nitrite, UA: NEGATIVE
Specific Gravity, UA: >1.03 — ABNORMAL HIGH (ref 1.005–1.030)
Urobilinogen, Ur: 0.2 mg/dL (ref 0.2–1.0)
pH, UA: 6 (ref 5.0–7.5)

## 2021-02-20 NOTE — Patient Instructions (Addendum)
D-mannose   Probiotics  Metamucil

## 2021-04-17 DIAGNOSIS — Z111 Encounter for screening for respiratory tuberculosis: Secondary | ICD-10-CM | POA: Diagnosis not present

## 2021-04-17 DIAGNOSIS — Z7960 Long term (current) use of unspecified immunomodulators and immunosuppressants: Secondary | ICD-10-CM | POA: Diagnosis not present

## 2021-04-17 DIAGNOSIS — I10 Essential (primary) hypertension: Secondary | ICD-10-CM | POA: Diagnosis not present

## 2021-04-17 DIAGNOSIS — I2510 Atherosclerotic heart disease of native coronary artery without angina pectoris: Secondary | ICD-10-CM | POA: Diagnosis not present

## 2021-04-17 DIAGNOSIS — M0579 Rheumatoid arthritis with rheumatoid factor of multiple sites without organ or systems involvement: Secondary | ICD-10-CM | POA: Diagnosis not present

## 2021-04-17 DIAGNOSIS — E7849 Other hyperlipidemia: Secondary | ICD-10-CM | POA: Diagnosis not present

## 2021-04-27 DIAGNOSIS — B9689 Other specified bacterial agents as the cause of diseases classified elsewhere: Secondary | ICD-10-CM | POA: Diagnosis not present

## 2021-04-27 DIAGNOSIS — J019 Acute sinusitis, unspecified: Secondary | ICD-10-CM | POA: Diagnosis not present

## 2021-05-07 DIAGNOSIS — J019 Acute sinusitis, unspecified: Secondary | ICD-10-CM | POA: Diagnosis not present

## 2021-05-07 DIAGNOSIS — M069 Rheumatoid arthritis, unspecified: Secondary | ICD-10-CM | POA: Diagnosis not present

## 2021-05-07 DIAGNOSIS — I10 Essential (primary) hypertension: Secondary | ICD-10-CM | POA: Diagnosis not present

## 2021-05-07 DIAGNOSIS — E119 Type 2 diabetes mellitus without complications: Secondary | ICD-10-CM | POA: Diagnosis not present

## 2021-05-07 DIAGNOSIS — J069 Acute upper respiratory infection, unspecified: Secondary | ICD-10-CM | POA: Diagnosis not present

## 2021-05-07 DIAGNOSIS — B370 Candidal stomatitis: Secondary | ICD-10-CM | POA: Diagnosis not present

## 2021-05-17 DIAGNOSIS — I10 Essential (primary) hypertension: Secondary | ICD-10-CM | POA: Diagnosis not present

## 2021-05-17 DIAGNOSIS — M059 Rheumatoid arthritis with rheumatoid factor, unspecified: Secondary | ICD-10-CM | POA: Diagnosis not present

## 2021-05-17 DIAGNOSIS — R0982 Postnasal drip: Secondary | ICD-10-CM | POA: Diagnosis not present

## 2021-05-17 DIAGNOSIS — R8290 Unspecified abnormal findings in urine: Secondary | ICD-10-CM | POA: Diagnosis not present

## 2021-05-17 DIAGNOSIS — M159 Polyosteoarthritis, unspecified: Secondary | ICD-10-CM | POA: Diagnosis not present

## 2021-05-17 DIAGNOSIS — I2510 Atherosclerotic heart disease of native coronary artery without angina pectoris: Secondary | ICD-10-CM | POA: Diagnosis not present

## 2021-05-17 DIAGNOSIS — E1165 Type 2 diabetes mellitus with hyperglycemia: Secondary | ICD-10-CM | POA: Diagnosis not present

## 2021-05-22 DIAGNOSIS — Z9011 Acquired absence of right breast and nipple: Secondary | ICD-10-CM | POA: Diagnosis not present

## 2021-05-22 NOTE — Progress Notes (Unsigned)
05/22/21 10:38 PM   Elaine Cox Oct 25, 1945 [id number]  Referring provider:  Tracie Harrier, [place] [facility] [place],  Rule [address]  No chief complaint on file.  Urological history 1. Microscopic hematuria - non-smoker - Non-contrast CT 08/2020 - non-obstructing tiny right renal calculi - cysto 08/2019 - NED - no reports of gross heme - UA 05/2021 negative for micro heme  2. Nephrolithiasis  - CT renal stone study 08/2020 - three right renal stone ~ 4 mm in size  3. Vaginal atrophy  - Managed on OTC vaginal lubricants   HPI: Elaine Cox is a 76 y.o.female who presents today for a three month follow up.    ***  PMH: Past Medical History:  Diagnosis Date   Anginal pain ([place]) 06/2016   being followed by dr. Ubaldo Glassing   Anxiety    Arthritis    Osteoarthritis   Arthritis    Rheumatoid   Breast cancer ([place]) 2000   Right Breast - Chemotherapy   Collagen vascular disease ([place])    Rheumatoid Arthritis.   Coronary artery disease    Diabetes mellitus without complication [facility])    Patient takes Metformin   Dyspnea 03/2017   GERD (gastroesophageal reflux disease)    History of abdominal hysterectomy    History of eyelid surgery    History of kidney stones    Hyperlipidemia    Hypertension    Lumbago    Murmur    Myocardial infarction ([place]) 07/2010   Orthopnea 03/2017   Personal history of chemotherapy 2000   right breast ca   Sinus disorder    Stroke [facility]) 2006, 2012   Wears dentures    full upper and lower    Surgical History: Past Surgical History:  Procedure Laterality Date   ABDOMINAL HYSTERECTOMY     BREAST SURGERY Right    mastectomy    CARDIAC CATHETERIZATION  2012   stent placed   CATARACT EXTRACTION W/PHACO Right 06/24/2017   Procedure: CATARACT EXTRACTION PHACO AND INTRAOCULAR LENS PLACEMENT ([place]) COMPLICATED RIGHT DIABETIC;  Surgeon: Leandrew Koyanagi, MD;  Location: [place];  Service:  Ophthalmology;  Laterality: Right;  Diabetes - oral med   COLONOSCOPY     COLONOSCOPY WITH PROPOFOL N/A 12/08/2017   Procedure: COLONOSCOPY WITH PROPOFOL;  Surgeon: Toledo, Benay Pike, MD;  Location: [facility];  Service: Gastroenterology;  Laterality: N/A;   CORONARY ANGIOPLASTY     CORONARY STENT INTERVENTION N/A 02/24/2018   Procedure: CORONARY STENT INTERVENTION;  Surgeon: [name], MD;  Location: [place] CV LAB;  Service: Cardiovascular;  Laterality: N/A;   CYSTOSCOPY N/A 09/08/2016   Procedure: CYSTOSCOPY;  Surgeon: Ward, Honor Loh, MD;  Location: [facility];  Service: Gynecology;  Laterality: N/A;   EYE SURGERY Left    Cataract Extraction with IOL   KNEE ARTHROPLASTY Right 03/14/2020   Procedure: COMPUTER ASSISTED TOTAL KNEE ARTHROPLASTY;  Surgeon: Dereck Leep, MD;  Location: [facility];  Service: Orthopedics;  Laterality: Right;  FOLLOWING 1ST CASE   KNEE ARTHROSCOPY W/ MENISCAL REPAIR Right    LAPAROSCOPIC BILATERAL SALPINGO OOPHERECTOMY Bilateral 09/08/2016   Procedure: LAPAROSCOPIC BILATERAL SALPINGO OOPHORECTOMY;  Surgeon: Ward, Honor Loh, MD;  Location: [facility];  Service: Gynecology;  Laterality: Bilateral;   LEFT HEART CATH AND CORONARY ANGIOGRAPHY Left 02/24/2018   Procedure: LEFT HEART CATH AND CORONARY ANGIOGRAPHY;  Surgeon: [name], MD;  Location: [place] [facility];  Service: Cardiovascular;  Laterality: Left;   MASTECTOMY Right 2000  BREAST CA    Home Medications:  Allergies as of 05/23/2021       Reactions   Doxazosin Other (See Comments)   "headache"   Fish Allergy Anaphylaxis, Itching, Nausea Only, Swelling   She states her tongue turns black.   Dicloxacillin Nausea And Vomiting   Has patient had a PCN reaction causing immediate rash, facial/tongue/throat swelling, SOB or lightheadedness with hypotension: No Has patient had a PCN reaction causing severe rash involving mucus membranes or skin necrosis: No Has patient had a PCN  reaction that required hospitalization: Unknown Has patient had a PCN reaction occurring within the last 10 years: Unknown If all of the above answers are "NO", then may proceed with Cephalosporin use.   Iodine Hives   Per patient, she had a previous reaction/hives from an iodine injection   Sulfa Antibiotics Swelling   Loss of appetite   Constipation bloating Other reaction(s): Headache   Beta Adrenergic Blockers Other (See Comments)   "Unknown"   Cardura [doxazosin Mesylate]    Headache    Etodolac Itching, Swelling   Sulfamethoxazole-trimethoprim Nausea Only   Azithromycin Itching   Ciprofloxacin Nausea Only, Rash   Clopidogrel Nausea And Vomiting   Colchicine Nausea Only, Other (See Comments)   Dizzness   Glucosamine Nausea And Vomiting, Other (See Comments)   Leflunomide Diarrhea, Itching   Meloxicam Itching   Methotrexate Nausea Only   Zoloft [sertraline] Nausea And Vomiting        Medication List        Accurate as of May 22, 2021 10:38 PM. If you have any questions, ask your nurse or doctor.          Accu-Chek Guide test strip Generic drug: glucose blood   acetaminophen 325 MG tablet Commonly known as: TYLENOL Take 2 tablets (650 mg total) by mouth every 6 (six) hours as needed for mild pain (or Fever >/= 101).   ALPRAZolam 0.25 MG tablet Commonly known as: XANAX Take 0.25 mg by mouth at bedtime.   amLODipine 5 MG tablet Commonly known as: NORVASC Take 5 mg by mouth daily.   aspirin 81 MG EC tablet Take 1 tablet (81 mg total) by mouth daily. Swallow whole.   B-D SINGLE USE SWABS REGULAR Pads   Biotin 800 MCG Tabs Take 800 mcg by mouth daily.   cyanocobalamin 1000 MCG tablet Take 1 tablet by mouth daily.   hydrOXYzine 10 MG tablet Commonly known as: ATARAX Take 10 mg by mouth daily.   iron polysaccharides 150 MG capsule Commonly known as: NIFEREX Take 150 mg by mouth daily.   metFORMIN 500 MG tablet Commonly known as:  GLUCOPHAGE Take 500 mg by mouth 2 (two) times daily with a meal.   metoprolol succinate 100 MG 24 hr tablet Commonly known as: TOPROL-XL Take 100 mg by mouth daily.   MURINE TEARS FOR DRY EYES OP Apply 1 drop to eye daily as needed (dry eyes).   Muscle Rub 10-15 % Crea Apply 1 application topically as needed for muscle pain.   nitroGLYCERIN 0.4 MG SL tablet Commonly known as: NITROSTAT Place 0.4 mg under the tongue every 5 (five) minutes as needed for chest pain.   omeprazole 20 MG capsule Commonly known as: PRILOSEC Take 20 mg by mouth daily.   Potassium Chloride ER 20 MEQ Tbcr Take 20 mEq by mouth daily.   rosuvastatin 5 MG tablet Commonly known as: CRESTOR Take 5 mg by mouth every Monday, Wednesday, and Friday.   Tofacitinib Citrate  5 MG Tabs Take 5 mg by mouth 2 (two) times daily.   True Metrix Level 1 Low Soln        Allergies:  Allergies  Allergen Reactions   Doxazosin Other (See Comments)    "headache"   Fish Allergy Anaphylaxis, Itching, Nausea Only and Swelling    She states her tongue turns black.    Dicloxacillin Nausea And Vomiting    Has patient had a PCN reaction causing immediate rash, facial/tongue/throat swelling, SOB or lightheadedness with hypotension: No Has patient had a PCN reaction causing severe rash involving mucus membranes or skin necrosis: No Has patient had a PCN reaction that required hospitalization: Unknown Has patient had a PCN reaction occurring within the last 10 years: Unknown If all of the above answers are "NO", then may proceed with Cephalosporin use.   Iodine Hives    Per patient, she had a previous reaction/hives from an iodine injection   Sulfa Antibiotics Swelling    Loss of appetite   Constipation bloating Other reaction(s): Headache    Beta Adrenergic Blockers Other (See Comments)    "Unknown"   Cardura [Doxazosin Mesylate]     Headache    Etodolac Itching and Swelling   Sulfamethoxazole-Trimethoprim Nausea  Only   Azithromycin Itching   Ciprofloxacin Nausea Only and Rash   Clopidogrel Nausea And Vomiting   Colchicine Nausea Only and Other (See Comments)    Dizzness   Glucosamine Nausea And Vomiting and Other (See Comments)   Leflunomide Diarrhea and Itching   Meloxicam Itching   Methotrexate Nausea Only   Zoloft [Sertraline] Nausea And Vomiting    Family History: Family History  Problem Relation Age of Onset   Diabetes type II Mother    Hypertension Mother    Colon polyps Mother    Breast cancer Maternal Aunt     Social History:  reports that she has never smoked. She has never used smokeless tobacco. She reports that she does not drink alcohol and does not use drugs.   Physical Exam: LMP  (LMP Unknown)   Constitutional:  Well nourished. Alert and oriented, No acute distress. HEENT: [place] AT, moist mucus membranes.  Trachea midline, no masses. Cardiovascular: No clubbing, cyanosis, or edema. Respiratory: Normal respiratory effort, no increased work of breathing. GI: Abdomen is soft, non tender, non distended, no abdominal masses. Liver and spleen not palpable.  No hernias appreciated.  Stool sample for occult testing is not indicated.   GU: No CVA tenderness.  No bladder fullness or masses.  *** external genitalia, *** pubic hair distribution, no lesions.  Normal urethral meatus, no lesions, no prolapse, no discharge.   No urethral masses, tenderness and/or tenderness. No bladder fullness, tenderness or masses. *** vagina mucosa, *** estrogen effect, no discharge, no lesions, *** pelvic support, *** cystocele and *** rectocele noted.  No cervical motion tenderness.  Uterus is freely mobile and non-fixed.  No adnexal/parametria masses or tenderness noted.  Anus and perineum are without rashes or lesions.   ***  Skin: No rashes, bruises or suspicious lesions. Lymph: No cervical or inguinal adenopathy. Neurologic: Grossly intact, no focal deficits, moving all 4 extremities. Psychiatric:  Normal mood and affect.    Laboratory Data: WBC (White Blood Cell Count) 4.1 - 10.2 103/uL 6.9   RBC (Red Blood Cell Count) 4.04 - 5.48 106/uL 5.13   Hemoglobin 12.0 - 15.0 gm/dL 14.7   Hematocrit 35.0 - 47.0 % 41.2   MCV (Mean Corpuscular Volume) 80.0 - 100.0 fl 80.3  MCH (Mean Corpuscular Hemoglobin) 27.0 - 31.2 pg 28.7   MCHC (Mean Corpuscular Hemoglobin Concentration) 32.0 - 36.0 gm/dL 35.7   Platelet Count 150 - 450 103/uL 262   RDW-CV (Red Cell Distribution Width) 11.6 - 14.8 % 13.6   MPV (Mean Platelet Volume) 9.4 - 12.4 fl 10.9   Neutrophils 1.50 - 7.80 103/uL 4.02   Lymphocytes 1.00 - 3.60 103/uL 1.95   Monocytes 0.00 - 1.50 103/uL 0.72   Eosinophils 0.00 - 0.55 103/uL 0.12   Basophils 0.00 - 0.09 103/uL 0.02   Neutrophil % 32.0 - 70.0 % 58.6   Lymphocyte % 10.0 - 50.0 % 28.5   Monocyte % 4.0 - 13.0 % 10.5   Eosinophil % 1.0 - 5.0 % 1.8   Basophil% 0.0 - 2.0 % 0.3   Immature Granulocyte % <=0.7 % 0.3   Immature Granulocyte Count <=0.06 10^3/L 0.02   Resulting Agency  [place] - LAB  Specimen Collected: 05/17/21 09:04 Last Resulted: 05/17/21 09:56  Received From: [place]  Result Received: 05/20/21 11:37   Glucose 70 - 110 mg/dL 166 High    Sodium 136 - 145 mmol/L 139   Potassium 3.6 - 5.1 mmol/L 3.6   Chloride 97 - 109 mmol/L 99   Carbon Dioxide (CO2) 22.0 - 32.0 mmol/L 33.6 High    Urea Nitrogen (BUN) 7 - 25 mg/dL 12   Creatinine 0.6 - 1.1 mg/dL 0.7   Glomerular Filtration Rate (eGFR), MDRD Estimate >60 mL/min/1.73sq m 82   Calcium 8.7 - 10.3 mg/dL 9.2   AST  8 - 39 U/L 10   ALT  5 - 38 U/L 5   Alk Phos (alkaline Phosphatase) 34 - 104 U/L 60   Albumin 3.5 - 4.8 g/dL 3.6   Bilirubin, Total 0.3 - 1.2 mg/dL 1.2   Protein, Total 6.1 - 7.9 g/dL 6.7   A/G Ratio 1.0 - 5.0 gm/dL 1.2   Resulting Agency  [place] - LAB  Specimen Collected: 05/17/21 09:04 Last Resulted: 05/17/21 13:16  Received From: [place] [facility]  Result Received: 05/20/21 11:37   Color Yellow, Violet, Light Violet, Dark Violet Yellow   Clarity Clear, Other Clear   Specific Gravity 1.000 - 1.030 >=1.030   pH, Urine 5.0 - 8.0 6.0   Protein, Urinalysis Negative, Trace mg/dL 30 Abnormal    Glucose, Urinalysis Negative mg/dL Negative   Ketones, Urinalysis Negative mg/dL Negative   Blood, Urinalysis Negative Negative   Nitrite, Urinalysis Negative Negative   Leukocyte Esterase, Urinalysis Negative Trace Abnormal    White Blood Cells, Urinalysis None Seen, 0-3 /hpf 4-10 Abnormal    Red Blood Cells, Urinalysis None Seen, 0-3 /hpf None Seen   Bacteria, Urinalysis None Seen /hpf Few Abnormal    Squamous Epithelial Cells, Urinalysis Rare, Few, None Seen /hpf Moderate Abnormal    Hyaline Casts, Urinalysis  2   Comment: 0-2 per LPF  Resulting Agency  [place] - LAB  Specimen Collected: 05/17/21 09:04 Last Resulted: 05/17/21 09:46  Received From: [place]  Result Received: 05/20/21 11:37  I have reviewed the labs.   Pertinent Imaging:  Assessment & Plan:    Right sided nephrolithiasis  - stable will continue to monitor yearly with KUB  Recurrent UTIs - Counseled her in UTI prevention supplements such as cranberry tablets, probiotics, yogurt that has active lactobacillus culture, and d-mannose  - RTC in 3 months or sooner if she develops signs of UTI.  Return in 3 months for recheck of symptoms   Zara Council, PA-C   [place] [address] [place], [place] [address] [phone]

## 2021-05-23 ENCOUNTER — Ambulatory Visit: Payer: Medicare HMO | Admitting: Urology

## 2021-05-23 DIAGNOSIS — N200 Calculus of kidney: Secondary | ICD-10-CM

## 2021-05-23 DIAGNOSIS — N390 Urinary tract infection, site not specified: Secondary | ICD-10-CM

## 2021-05-27 DIAGNOSIS — Z0000 Encounter for general adult medical examination without abnormal findings: Secondary | ICD-10-CM | POA: Diagnosis not present

## 2021-05-27 DIAGNOSIS — I10 Essential (primary) hypertension: Secondary | ICD-10-CM | POA: Diagnosis not present

## 2021-05-27 DIAGNOSIS — I2510 Atherosclerotic heart disease of native coronary artery without angina pectoris: Secondary | ICD-10-CM | POA: Diagnosis not present

## 2021-05-27 DIAGNOSIS — H6123 Impacted cerumen, bilateral: Secondary | ICD-10-CM | POA: Diagnosis not present

## 2021-05-27 DIAGNOSIS — F418 Other specified anxiety disorders: Secondary | ICD-10-CM | POA: Diagnosis not present

## 2021-05-27 DIAGNOSIS — M069 Rheumatoid arthritis, unspecified: Secondary | ICD-10-CM | POA: Diagnosis not present

## 2021-05-27 DIAGNOSIS — B370 Candidal stomatitis: Secondary | ICD-10-CM | POA: Diagnosis not present

## 2021-05-27 DIAGNOSIS — E119 Type 2 diabetes mellitus without complications: Secondary | ICD-10-CM | POA: Diagnosis not present

## 2021-06-05 DIAGNOSIS — Z9011 Acquired absence of right breast and nipple: Secondary | ICD-10-CM | POA: Diagnosis not present

## 2021-06-07 DIAGNOSIS — H6063 Unspecified chronic otitis externa, bilateral: Secondary | ICD-10-CM | POA: Diagnosis not present

## 2021-06-07 DIAGNOSIS — J301 Allergic rhinitis due to pollen: Secondary | ICD-10-CM | POA: Diagnosis not present

## 2021-06-11 DIAGNOSIS — Z96651 Presence of right artificial knee joint: Secondary | ICD-10-CM | POA: Diagnosis not present

## 2021-06-18 DIAGNOSIS — F418 Other specified anxiety disorders: Secondary | ICD-10-CM | POA: Diagnosis not present

## 2021-06-18 DIAGNOSIS — L299 Pruritus, unspecified: Secondary | ICD-10-CM | POA: Diagnosis not present

## 2021-06-18 DIAGNOSIS — M069 Rheumatoid arthritis, unspecified: Secondary | ICD-10-CM | POA: Diagnosis not present

## 2021-06-18 DIAGNOSIS — B379 Candidiasis, unspecified: Secondary | ICD-10-CM | POA: Diagnosis not present

## 2021-06-18 DIAGNOSIS — E119 Type 2 diabetes mellitus without complications: Secondary | ICD-10-CM | POA: Diagnosis not present

## 2021-06-18 DIAGNOSIS — J305 Allergic rhinitis due to food: Secondary | ICD-10-CM | POA: Diagnosis not present

## 2021-06-18 DIAGNOSIS — R0982 Postnasal drip: Secondary | ICD-10-CM | POA: Diagnosis not present

## 2021-06-18 DIAGNOSIS — I2510 Atherosclerotic heart disease of native coronary artery without angina pectoris: Secondary | ICD-10-CM | POA: Diagnosis not present

## 2021-06-18 DIAGNOSIS — B370 Candidal stomatitis: Secondary | ICD-10-CM | POA: Diagnosis not present

## 2021-06-18 DIAGNOSIS — I10 Essential (primary) hypertension: Secondary | ICD-10-CM | POA: Diagnosis not present

## 2021-06-18 DIAGNOSIS — K143 Hypertrophy of tongue papillae: Secondary | ICD-10-CM | POA: Diagnosis not present

## 2021-06-18 DIAGNOSIS — J301 Allergic rhinitis due to pollen: Secondary | ICD-10-CM | POA: Diagnosis not present

## 2021-06-18 DIAGNOSIS — R059 Cough, unspecified: Secondary | ICD-10-CM | POA: Diagnosis not present

## 2021-06-19 ENCOUNTER — Other Ambulatory Visit: Payer: Self-pay | Admitting: *Deleted

## 2021-06-19 ENCOUNTER — Ambulatory Visit
Admission: RE | Admit: 2021-06-19 | Discharge: 2021-06-19 | Disposition: A | Discharge status: Home / Self Care | Payer: Medicare HMO | Attending: Urology | Admitting: Urology

## 2021-06-19 ENCOUNTER — Ambulatory Visit: Payer: Medicare HMO | Admitting: Urology

## 2021-06-19 ENCOUNTER — Ambulatory Visit
Admission: RE | Admit: 2021-06-19 | Discharge: 2021-06-19 | Disposition: A | Discharge status: Home / Self Care | Payer: Medicare HMO | Source: Ambulatory Visit | Attending: Urology | Admitting: Urology

## 2021-06-19 ENCOUNTER — Encounter: Payer: Self-pay | Admitting: Urology

## 2021-06-19 ENCOUNTER — Other Ambulatory Visit: Payer: Self-pay

## 2021-06-19 VITALS — BP 133/79 | HR 60 | Ht 62.0 in | Wt 181.0 lb

## 2021-06-19 DIAGNOSIS — N200 Calculus of kidney: Secondary | ICD-10-CM

## 2021-06-19 DIAGNOSIS — R109 Unspecified abdominal pain: Secondary | ICD-10-CM

## 2021-06-19 DIAGNOSIS — R319 Hematuria, unspecified: Secondary | ICD-10-CM | POA: Diagnosis not present

## 2021-06-19 DIAGNOSIS — N390 Urinary tract infection, site not specified: Secondary | ICD-10-CM

## 2021-06-19 LAB — BLADDER SCAN AMB NON-IMAGING

## 2021-06-19 NOTE — Progress Notes (Signed)
06/28/21 ?9:23 AM  ? ?[name] ?[birth year] ?[id number] ? ?Referring provider:  ?Tracie Harrier, MD ?[place] ?[facility] ?[place],  [place] [address] ? ?Chief Complaint  ?Patient presents with  ? Nephrolithiasis  ? ?Urological history ?1. Microscopic hematuria ?- non-smoker ?- Non-contrast CT 08/2020 - non-obstructing tiny right renal calculi ?- cysto 08/2019 - NED ?- no reports of gross heme ?- UA 05/2021 negative for micro heme ? ?2. Nephrolithiasis  ?- CT renal stone study 08/2020 - three right renal stone ~ 4 mm in size ? ?3. Vaginal atrophy  ?- Managed on OTC vaginal lubricants  ? ?HPI: ?Elaine Cox is a 76 y.o.female who presents today for a three month follow up.   ? ?She is having 1-7 daytime voids, 3 or more night time voids and a mild urge to urinate.  She has 1-2 weekly episodes of leakage.  She wears one depends daily.  She does not limit fluid intake and engages in toilet mapping on occasions.  ? ?Patient denies any modifying or aggravating factors.  Patient denies any gross hematuria, dysuria or suprapubic/flank pain.  Patient denies any fevers, chills, nausea or vomiting.   ? ?PVR 0 mL   ? ?She is having some right flank pain that radiates to the right waist.   ? ?KUB punctate right renal stones, linear calcification in the left upper pole and what appears to be a migration of a 10 mm calcification from the left kidney to down to the left iliac.   ? ?PMH: ?Past Medical History:  ?Diagnosis Date  ? Anginal pain ([place]) 06/2016  ? being followed by dr. Ubaldo Glassing  ? Anxiety   ? Arthritis   ? Osteoarthritis  ? Arthritis   ? Rheumatoid  ? Breast cancer ([place]) 2000  ? Right Breast - Chemotherapy  ? Collagen vascular disease ([place])   ? Rheumatoid Arthritis.  ? Coronary artery disease   ? Diabetes mellitus without complication ([place])   ? Patient takes Metformin  ? Dyspnea 03/2017  ? GERD (gastroesophageal reflux disease)   ? History of abdominal hysterectomy   ? History of eyelid  surgery   ? History of kidney stones   ? Hyperlipidemia   ? Hypertension   ? Lumbago   ? Murmur   ? Myocardial infarction [facility]) 07/2010  ? Orthopnea 03/2017  ? Personal history of chemotherapy 2000  ? right breast ca  ? Sinus disorder   ? Stroke [facility]) 2006, 2012  ? Wears dentures   ? full upper and lower  ? ? ?Surgical History: ?Past Surgical History:  ?Procedure Laterality Date  ? ABDOMINAL HYSTERECTOMY    ? BREAST SURGERY Right   ? mastectomy   ? CARDIAC CATHETERIZATION  2012  ? stent placed  ? CATARACT EXTRACTION W/PHACO Right 06/24/2017  ? Procedure: CATARACT EXTRACTION PHACO AND INTRAOCULAR LENS PLACEMENT ([place]) COMPLICATED RIGHT DIABETIC;  Surgeon: Leandrew Koyanagi, MD;  Location: [place];  Service: Ophthalmology;  Laterality: Right;  Diabetes - oral med  ? COLONOSCOPY    ? COLONOSCOPY WITH PROPOFOL N/A 12/08/2017  ? Procedure: COLONOSCOPY WITH PROPOFOL;  Surgeon: Toledo, Benay Pike, MD;  Location: [facility];  Service: Gastroenterology;  Laterality: N/A;  ? CORONARY ANGIOPLASTY    ? CORONARY STENT INTERVENTION N/A 02/24/2018  ? Procedure: CORONARY STENT INTERVENTION;  Surgeon: [name], MD;  Location: [place] [facility];  Service: Cardiovascular;  Laterality: N/A;  ? CYSTOSCOPY N/A 09/08/2016  ? Procedure: CYSTOSCOPY;  Surgeon: Ward, Vikki Ports  C, MD;  Location: [facility];  Service: Gynecology;  Laterality: N/A;  ? EYE SURGERY Left   ? Cataract Extraction with IOL  ? KNEE ARTHROPLASTY Right 03/14/2020  ? Procedure: COMPUTER ASSISTED TOTAL KNEE ARTHROPLASTY;  Surgeon: Dereck Leep, MD;  Location: [facility];  Service: Orthopedics;  Laterality: Right;  FOLLOWING 1ST CASE  ? KNEE ARTHROSCOPY W/ MENISCAL REPAIR Right   ? LAPAROSCOPIC BILATERAL SALPINGO OOPHERECTOMY Bilateral 09/08/2016  ? Procedure: LAPAROSCOPIC BILATERAL SALPINGO OOPHORECTOMY;  Surgeon: Ward, Honor Loh, MD;  Location: [facility];  Service: Gynecology;  Laterality: Bilateral;  ? LEFT HEART CATH AND CORONARY ANGIOGRAPHY Left  02/24/2018  ? Procedure: LEFT HEART CATH AND CORONARY ANGIOGRAPHY;  Surgeon: [name], MD;  Location: [place] [facility];  Service: Cardiovascular;  Laterality: Left;  ? MASTECTOMY Right 2000  ? BREAST CA  ? ? ?Home Medications:  ?Allergies as of 06/19/2021   ? ?   Reactions  ? Doxazosin Other (See Comments)  ? "headache"  ? Fish Allergy Anaphylaxis, Itching, Nausea Only, Swelling  ? She states her tongue turns black.  ? Dicloxacillin Nausea And Vomiting  ? Has patient had a PCN reaction causing immediate rash, facial/tongue/throat swelling, SOB or lightheadedness with hypotension: No ?Has patient had a PCN reaction causing severe rash involving mucus membranes or skin necrosis: No ?Has patient had a PCN reaction that required hospitalization: Unknown ?Has patient had a PCN reaction occurring within the last 10 years: Unknown ?If all of the above answers are "NO", then may proceed with Cephalosporin use.  ? Iodine Hives  ? Per patient, she had a previous reaction/hives from an iodine injection  ? Sulfa Antibiotics Swelling  ? Loss of appetite   Constipation bloating ?Other reaction(s): Headache  ? Beta Adrenergic Blockers Other (See Comments)  ? "Unknown"  ? Cardura [doxazosin Mesylate]   ? Headache   ? Etodolac Itching, Swelling  ? Sulfamethoxazole-trimethoprim Nausea Only  ? Azithromycin Itching  ? Ciprofloxacin Nausea Only, Rash  ? Clopidogrel Nausea And Vomiting  ? Colchicine Nausea Only, Other (See Comments)  ? Dizzness  ? Glucosamine Nausea And Vomiting, Other (See Comments)  ? Leflunomide Diarrhea, Itching  ? Meloxicam Itching  ? Methotrexate Nausea Only  ? Zoloft [sertraline] Nausea And Vomiting  ? ?  ? ?  ?Medication List  ?  ? ?  ? Accurate as of June 19, 2021 11:59 PM. If you have any questions, ask your nurse or doctor.  ?  ?  ? ?  ? ?Accu-Chek Guide test strip ?Generic drug: glucose blood ?  ?acetaminophen 325 MG tablet ?Commonly known as: TYLENOL ?Take 2 tablets (650 mg total) by mouth  every 6 (six) hours as needed for mild pain (or Fever >/= 101). ?  ?ALPRAZolam 0.25 MG tablet ?Commonly known as: Duanne Moron ?Take 0.25 mg by mouth at bedtime. ?  ?amLODipine 5 MG tablet ?Commonly known as: NORVASC ?Take 5 mg by mouth daily. ?  ?aspirin 81 MG EC tablet ?Take 1 tablet (81 mg total) by mouth daily. Swallow whole. ?  ?B-D SINGLE USE SWABS REGULAR Pads ?  ?Biotin 800 MCG Tabs ?Take 800 mcg by mouth daily. ?  ?hydrOXYzine 10 MG tablet ?Commonly known as: ATARAX ?Take 10 mg by mouth daily. ?  ?iron polysaccharides 150 MG capsule ?Commonly known as: NIFEREX ?Take 150 mg by mouth daily. ?  ?metFORMIN 500 MG tablet ?Commonly known as: GLUCOPHAGE ?Take 500 mg by mouth 2 (two) times daily with a meal. ?  ?metoprolol succinate  100 MG 24 hr tablet ?Commonly known as: TOPROL-XL ?Take 100 mg by mouth daily. ?  ?MURINE TEARS FOR DRY EYES OP ?Apply 1 drop to eye daily as needed (dry eyes). ?  ?Muscle Rub 10-15 % Crea ?Apply 1 application topically as needed for muscle pain. ?  ?nitroGLYCERIN 0.4 MG SL tablet ?Commonly known as: NITROSTAT ?Place 0.4 mg under the tongue every 5 (five) minutes as needed for chest pain. ?  ?omeprazole 20 MG capsule ?Commonly known as: PRILOSEC ?Take 20 mg by mouth daily. ?  ?Potassium Chloride ER 20 MEQ Tbcr ?Take 20 mEq by mouth daily. ?  ?rosuvastatin 5 MG tablet ?Commonly known as: CRESTOR ?Take 5 mg by mouth every Monday, Wednesday, and Friday. ?  ?Tofacitinib Citrate 5 MG Tabs ?Take 5 mg by mouth 2 (two) times daily. ?  ?True Metrix Level 1 Low Soln ?  ? ?  ? ? ?Allergies:  ?Allergies  ?Allergen Reactions  ? Doxazosin Other (See Comments)  ?  "headache"  ? Fish Allergy Anaphylaxis, Itching, Nausea Only and Swelling  ?  She states her tongue turns black. ?  ? Dicloxacillin Nausea And Vomiting  ?  Has patient had a PCN reaction causing immediate rash, facial/tongue/throat swelling, SOB or lightheadedness with hypotension: No ?Has patient had a PCN reaction causing severe rash involving  mucus membranes or skin necrosis: No ?Has patient had a PCN reaction that required hospitalization: Unknown ?Has patient had a PCN reaction occurring within the last 10 years: Unknown ?If all of the above answers

## 2021-07-03 ENCOUNTER — Other Ambulatory Visit: Payer: Self-pay

## 2021-07-03 ENCOUNTER — Ambulatory Visit
Admission: RE | Admit: 2021-07-03 | Discharge: 2021-07-03 | Disposition: A | Discharge status: Home / Self Care | Payer: Medicare HMO | Source: Ambulatory Visit | Attending: Urology | Admitting: Urology

## 2021-07-03 DIAGNOSIS — I700 Atherosclerosis of aorta: Secondary | ICD-10-CM | POA: Diagnosis not present

## 2021-07-03 DIAGNOSIS — K5730 Diverticulosis of large intestine without perforation or abscess without bleeding: Secondary | ICD-10-CM | POA: Diagnosis not present

## 2021-07-03 DIAGNOSIS — Z90710 Acquired absence of both cervix and uterus: Secondary | ICD-10-CM | POA: Diagnosis not present

## 2021-07-03 DIAGNOSIS — N200 Calculus of kidney: Secondary | ICD-10-CM | POA: Diagnosis not present

## 2021-07-03 DIAGNOSIS — R109 Unspecified abdominal pain: Secondary | ICD-10-CM | POA: Insufficient documentation

## 2021-07-10 NOTE — Progress Notes (Signed)
07/27/21 ?10:56 AM  ? ?Elaine Cox ?02-14-1946 ?[id number] ? ?Referring provider:  ?Tracie Harrier, MD ?[place] ?[facility] ?[place],  [place] [address] ? ?Chief Complaint  ?Patient presents with  ? Results  ? ?Urological history  ?1. High risk hematuria ?- non-smoker ?- Non-contrast CT 08/2020 - non-obstructing tiny right renal calculi ?- cysto 08/2019 - NED ?- no reports of gross heme ?- UA negative for micro heme - 05/2021 ?  ?2. Nephrolithiasis  ?- CT renal stone study 08/2020 - three right renal stone ~ 4 mm in size ?- KUB on 06/21/2021 visualized Stable multiple tiny right renal calculi. Stable vascular calcification overlying the upper pole of the left kidney or upper pole left renal staghorn type calculus. ? - CT renal stone study on 07/03/2021 visualized multiple small nonobstructive right renal calculi. No left-sided calculi, ureteral calculi, or hydronephrosis. ? ?3. Vaginal atrophy  ?- Managed on OTC vaginal lubricants  ? ?4. rUTI's ?-Contributing factors of age, vaginal atrophy, diabetes and constipation ?-Documented positive urine cultures over the last year ? July 12, 2020 E. Coli ? Aug 13, 2020 E. Coli ? December 27, 2020 Staphylococcus ? February 08, 2021 E. Coli ? ?HPI: ?Elaine Cox is a 76 y.o.female who presents today for a 2 week follow-up with CT results.  ? ?CT renal stone study completed 07/03/2021 three right renal stones approximately 4 mm in size. ? ?She continues to report right-sided flank pain. ? ?She reports urinary urgency which she attributes to her stones, she also reports recurrent UTIs . ? ?Patient denies any modifying or aggravating factors.  Patient denies any gross hematuria, dysuria or suprapubic/flank pain.  Patient denies any fevers, chills, nausea or vomiting.  ? ?PMH: ?Past Medical History:  ?Diagnosis Date  ? Anginal pain ([place]) 06/2016  ? being followed by dr. Ubaldo Glassing  ? Anxiety   ? Arthritis   ? Osteoarthritis  ? Arthritis   ? Rheumatoid  ?  Breast cancer ([place]) 2000  ? Right Breast - Chemotherapy  ? Collagen vascular disease ([place])   ? Rheumatoid Arthritis.  ? Coronary artery disease   ? Diabetes mellitus without complication ([place])   ? Patient takes Metformin  ? Dyspnea 03/2017  ? GERD (gastroesophageal reflux disease)   ? History of abdominal hysterectomy   ? History of eyelid surgery   ? History of kidney stones   ? Hyperlipidemia   ? Hypertension   ? Lumbago   ? Murmur   ? Myocardial infarction [facility]) 07/2010  ? Orthopnea 03/2017  ? Personal history of chemotherapy 2000  ? right breast ca  ? Sinus disorder   ? Stroke [facility]) 2006, 2012  ? Wears dentures   ? full upper and lower  ? ? ?Surgical History: ?Past Surgical History:  ?Procedure Laterality Date  ? ABDOMINAL HYSTERECTOMY    ? BREAST SURGERY Right   ? mastectomy   ? CARDIAC CATHETERIZATION  2012  ? stent placed  ? CATARACT EXTRACTION W/PHACO Right 06/24/2017  ? Procedure: CATARACT EXTRACTION PHACO AND INTRAOCULAR LENS PLACEMENT ([place]) COMPLICATED RIGHT DIABETIC;  Surgeon: Leandrew Koyanagi, MD;  Location: [facility];  Service: Ophthalmology;  Laterality: Right;  Diabetes - oral med  ? COLONOSCOPY    ? COLONOSCOPY WITH PROPOFOL N/A 12/08/2017  ? Procedure: COLONOSCOPY WITH PROPOFOL;  Surgeon: Toledo, Benay Pike, MD;  Location: [facility];  Service: Gastroenterology;  Laterality: N/A;  ? CORONARY ANGIOPLASTY    ? CORONARY STENT INTERVENTION N/A 02/24/2018  ? Procedure:  CORONARY STENT INTERVENTION;  Surgeon: [name], MD;  Location: [place] CV [facility];  Service: Cardiovascular;  Laterality: N/A;  ? CYSTOSCOPY N/A 09/08/2016  ? Procedure: CYSTOSCOPY;  Surgeon: Ward, Honor Loh, MD;  Location: [facility];  Service: Gynecology;  Laterality: N/A;  ? EYE SURGERY Left   ? Cataract Extraction with IOL  ? KNEE ARTHROPLASTY Right 03/14/2020  ? Procedure: COMPUTER ASSISTED TOTAL KNEE ARTHROPLASTY;  Surgeon: Dereck Leep, MD;  Location: [facility];  Service: Orthopedics;  Laterality: Right;   FOLLOWING 1ST CASE  ? KNEE ARTHROSCOPY W/ MENISCAL REPAIR Right   ? LAPAROSCOPIC BILATERAL SALPINGO OOPHERECTOMY Bilateral 09/08/2016  ? Procedure: LAPAROSCOPIC BILATERAL SALPINGO OOPHORECTOMY;  Surgeon: Ward, Honor Loh, MD;  Location: [facility];  Service: Gynecology;  Laterality: Bilateral;  ? LEFT HEART CATH AND CORONARY ANGIOGRAPHY Left 02/24/2018  ? Procedure: LEFT HEART CATH AND CORONARY ANGIOGRAPHY;  Surgeon: [name], MD;  Location: [place] [facility];  Service: Cardiovascular;  Laterality: Left;  ? MASTECTOMY Right 2000  ? BREAST CA  ? ? ?Home Medications:  ?Allergies as of 07/11/2021   ? ?   Reactions  ? Doxazosin Other (See Comments)  ? "headache"  ? Fish Allergy Anaphylaxis, Itching, Nausea Only, Swelling  ? She states her tongue turns black.  ? Dicloxacillin Nausea And Vomiting  ? Has patient had a PCN reaction causing immediate rash, facial/tongue/throat swelling, SOB or lightheadedness with hypotension: No ?Has patient had a PCN reaction causing severe rash involving mucus membranes or skin necrosis: No ?Has patient had a PCN reaction that required hospitalization: Unknown ?Has patient had a PCN reaction occurring within the last 10 years: Unknown ?If all of the above answers are "NO", then may proceed with Cephalosporin use.  ? Iodine Hives  ? Per patient, she had a previous reaction/hives from an iodine injection  ? Sulfa Antibiotics Swelling  ? Loss of appetite   Constipation bloating ?Other reaction(s): Headache  ? Beta Adrenergic Blockers Other (See Comments)  ? "Unknown"  ? Cardura [doxazosin Mesylate]   ? Headache   ? Etodolac Itching, Swelling  ? Sulfamethoxazole-trimethoprim Nausea Only  ? Azithromycin Itching  ? Ciprofloxacin Nausea Only, Rash  ? Clopidogrel Nausea And Vomiting  ? Colchicine Nausea Only, Other (See Comments)  ? Dizzness  ? Glucosamine Nausea And Vomiting, Other (See Comments)  ? Leflunomide Diarrhea, Itching  ? Meloxicam Itching  ? Methotrexate Nausea Only  ? Zoloft  [sertraline] Nausea And Vomiting  ? ?  ? ?  ?Medication List  ?  ? ?  ? Accurate as of July 11, 2021 11:59 PM. If you have any questions, ask your nurse or doctor.  ?  ?  ? ?  ? ?Accu-Chek Guide test strip ?Generic drug: glucose blood ?  ?acetaminophen 325 MG tablet ?Commonly known as: TYLENOL ?Take 2 tablets (650 mg total) by mouth every 6 (six) hours as needed for mild pain (or Fever >/= 101). ?  ?ALPRAZolam 0.25 MG tablet ?Commonly known as: Duanne Moron ?Take 0.25 mg by mouth at bedtime. ?  ?amLODipine 5 MG tablet ?Commonly known as: NORVASC ?Take 5 mg by mouth daily. ?  ?aspirin 81 MG EC tablet ?Take 1 tablet (81 mg total) by mouth daily. Swallow whole. ?  ?B-D SINGLE USE SWABS REGULAR Pads ?  ?Biotin 800 MCG Tabs ?Take 800 mcg by mouth daily. ?  ?hydrOXYzine 10 MG tablet ?Commonly known as: ATARAX ?Take 10 mg by mouth daily. ?  ?iron polysaccharides 150 MG capsule ?Commonly known as:  NIFEREX ?Take 150 mg by mouth daily. ?  ?metFORMIN 500 MG tablet ?Commonly known as: GLUCOPHAGE ?Take 500 mg by mouth 2 (two) times daily with a meal. ?  ?metoprolol succinate 100 MG 24 hr tablet ?Commonly known as: TOPROL-XL ?Take 100 mg by mouth daily. ?  ?MURINE TEARS FOR DRY EYES OP ?Apply 1 drop to eye daily as needed (dry eyes). ?  ?Muscle Rub 10-15 % Crea ?Apply 1 application topically as needed for muscle pain. ?  ?nitroGLYCERIN 0.4 MG SL tablet ?Commonly known as: NITROSTAT ?Place 0.4 mg under the tongue every 5 (five) minutes as needed for chest pain. ?  ?omeprazole 20 MG capsule ?Commonly known as: PRILOSEC ?Take 20 mg by mouth daily. ?  ?Potassium Chloride ER 20 MEQ Tbcr ?Take 20 mEq by mouth daily. ?  ?rosuvastatin 5 MG tablet ?Commonly known as: CRESTOR ?Take 5 mg by mouth every Monday, Wednesday, and Friday. ?  ?Tofacitinib Citrate 5 MG Tabs ?Take 5 mg by mouth 2 (two) times daily. ?  ?True Metrix Level 1 Low Soln ?  ? ?  ? ? ?Allergies:  ?Allergies  ?Allergen Reactions  ? Doxazosin Other (See Comments)  ?  "headache"  ?  Fish Allergy Anaphylaxis, Itching, Nausea Only and Swelling  ?  She states her tongue turns black. ?  ? Dicloxacillin Nausea And Vomiting  ?  Has patient had a PCN reaction causing immediate rash, facial/

## 2021-07-11 ENCOUNTER — Ambulatory Visit: Payer: Medicare HMO | Admitting: Urology

## 2021-07-11 VITALS — BP 159/81 | HR 60 | Ht 62.0 in | Wt 180.0 lb

## 2021-07-11 DIAGNOSIS — N200 Calculus of kidney: Secondary | ICD-10-CM | POA: Diagnosis not present

## 2021-07-11 DIAGNOSIS — R319 Hematuria, unspecified: Secondary | ICD-10-CM

## 2021-07-11 DIAGNOSIS — N390 Urinary tract infection, site not specified: Secondary | ICD-10-CM

## 2021-07-11 DIAGNOSIS — Z87442 Personal history of urinary calculi: Secondary | ICD-10-CM

## 2021-07-27 ENCOUNTER — Encounter: Payer: Self-pay | Admitting: Urology

## 2021-08-05 DIAGNOSIS — L299 Pruritus, unspecified: Secondary | ICD-10-CM | POA: Diagnosis not present

## 2021-08-05 DIAGNOSIS — K219 Gastro-esophageal reflux disease without esophagitis: Secondary | ICD-10-CM | POA: Diagnosis not present

## 2021-08-05 DIAGNOSIS — M069 Rheumatoid arthritis, unspecified: Secondary | ICD-10-CM | POA: Diagnosis not present

## 2021-08-05 DIAGNOSIS — H6093 Unspecified otitis externa, bilateral: Secondary | ICD-10-CM | POA: Diagnosis not present

## 2021-08-05 DIAGNOSIS — F418 Other specified anxiety disorders: Secondary | ICD-10-CM | POA: Diagnosis not present

## 2021-08-05 DIAGNOSIS — E119 Type 2 diabetes mellitus without complications: Secondary | ICD-10-CM | POA: Diagnosis not present

## 2021-08-05 DIAGNOSIS — K143 Hypertrophy of tongue papillae: Secondary | ICD-10-CM | POA: Diagnosis not present

## 2021-08-05 DIAGNOSIS — I10 Essential (primary) hypertension: Secondary | ICD-10-CM | POA: Diagnosis not present

## 2021-08-15 DIAGNOSIS — M0579 Rheumatoid arthritis with rheumatoid factor of multiple sites without organ or systems involvement: Secondary | ICD-10-CM | POA: Diagnosis not present

## 2021-08-15 DIAGNOSIS — Z79899 Other long term (current) drug therapy: Secondary | ICD-10-CM | POA: Diagnosis not present

## 2021-09-17 DIAGNOSIS — I10 Essential (primary) hypertension: Secondary | ICD-10-CM | POA: Diagnosis not present

## 2021-09-17 DIAGNOSIS — E1165 Type 2 diabetes mellitus with hyperglycemia: Secondary | ICD-10-CM | POA: Diagnosis not present

## 2021-09-17 DIAGNOSIS — F419 Anxiety disorder, unspecified: Secondary | ICD-10-CM | POA: Diagnosis not present

## 2021-09-17 DIAGNOSIS — H6123 Impacted cerumen, bilateral: Secondary | ICD-10-CM | POA: Diagnosis not present

## 2021-09-17 DIAGNOSIS — M0579 Rheumatoid arthritis with rheumatoid factor of multiple sites without organ or systems involvement: Secondary | ICD-10-CM | POA: Diagnosis not present

## 2021-09-17 DIAGNOSIS — I2510 Atherosclerotic heart disease of native coronary artery without angina pectoris: Secondary | ICD-10-CM | POA: Diagnosis not present

## 2021-09-17 DIAGNOSIS — B370 Candidal stomatitis: Secondary | ICD-10-CM | POA: Diagnosis not present

## 2021-09-23 DIAGNOSIS — R3129 Other microscopic hematuria: Secondary | ICD-10-CM | POA: Diagnosis not present

## 2021-09-23 DIAGNOSIS — R399 Unspecified symptoms and signs involving the genitourinary system: Secondary | ICD-10-CM | POA: Diagnosis not present

## 2021-09-23 DIAGNOSIS — F330 Major depressive disorder, recurrent, mild: Secondary | ICD-10-CM | POA: Diagnosis not present

## 2021-09-23 DIAGNOSIS — R8290 Unspecified abnormal findings in urine: Secondary | ICD-10-CM | POA: Diagnosis not present

## 2021-09-23 DIAGNOSIS — E119 Type 2 diabetes mellitus without complications: Secondary | ICD-10-CM | POA: Diagnosis not present

## 2021-09-23 DIAGNOSIS — M05721 Rheumatoid arthritis with rheumatoid factor of right elbow without organ or systems involvement: Secondary | ICD-10-CM | POA: Diagnosis not present

## 2021-09-23 DIAGNOSIS — I2510 Atherosclerotic heart disease of native coronary artery without angina pectoris: Secondary | ICD-10-CM | POA: Diagnosis not present

## 2021-09-23 DIAGNOSIS — K219 Gastro-esophageal reflux disease without esophagitis: Secondary | ICD-10-CM | POA: Diagnosis not present

## 2021-09-23 DIAGNOSIS — F418 Other specified anxiety disorders: Secondary | ICD-10-CM | POA: Diagnosis not present

## 2021-09-23 DIAGNOSIS — I10 Essential (primary) hypertension: Secondary | ICD-10-CM | POA: Diagnosis not present

## 2021-09-30 ENCOUNTER — Other Ambulatory Visit: Payer: Self-pay | Admitting: Ophthalmology

## 2021-09-30 DIAGNOSIS — Z0100 Encounter for examination of eyes and vision without abnormal findings: Secondary | ICD-10-CM | POA: Diagnosis not present

## 2021-09-30 DIAGNOSIS — H5340 Unspecified visual field defects: Secondary | ICD-10-CM | POA: Diagnosis not present

## 2021-09-30 DIAGNOSIS — Z961 Presence of intraocular lens: Secondary | ICD-10-CM | POA: Diagnosis not present

## 2021-09-30 DIAGNOSIS — Z79899 Other long term (current) drug therapy: Secondary | ICD-10-CM | POA: Diagnosis not present

## 2021-09-30 DIAGNOSIS — E119 Type 2 diabetes mellitus without complications: Secondary | ICD-10-CM | POA: Diagnosis not present

## 2021-10-06 ENCOUNTER — Ambulatory Visit: Payer: Medicare HMO

## 2021-10-10 ENCOUNTER — Telehealth: Payer: Self-pay

## 2021-10-10 NOTE — Telephone Encounter (Signed)
Please notify patient's daughter that I would be willing to take patient on, however I do not prescribe alprazolam for which she appears to be taking every evening at bedtime.  If the patient is willing to stop the alprazolam then we can work on something else for her to take for sleep.   If she agrees to stop alprazolam then she will need to be scheduled at the end of a session.

## 2021-10-10 NOTE — Telephone Encounter (Signed)
Patient is calling in asking if Elaine Cox would take her on as a patient, daughter -[name] [phone] is a current patient of Kate's.

## 2021-10-10 NOTE — Telephone Encounter (Signed)
Patient was notified, states that she is willing to discuss coming off of the medication.

## 2021-10-15 ENCOUNTER — Ambulatory Visit
Admission: RE | Admit: 2021-10-15 | Discharge: 2021-10-15 | Disposition: A | Discharge status: Home / Self Care | Payer: Medicare HMO | Source: Ambulatory Visit | Attending: Ophthalmology | Admitting: Ophthalmology

## 2021-10-15 DIAGNOSIS — R531 Weakness: Secondary | ICD-10-CM | POA: Diagnosis not present

## 2021-10-15 DIAGNOSIS — H5340 Unspecified visual field defects: Secondary | ICD-10-CM | POA: Insufficient documentation

## 2021-10-15 DIAGNOSIS — H538 Other visual disturbances: Secondary | ICD-10-CM | POA: Diagnosis not present

## 2021-10-15 DIAGNOSIS — R519 Headache, unspecified: Secondary | ICD-10-CM | POA: Diagnosis not present

## 2021-10-15 MED ORDER — GADOBUTROL 1 MMOL/ML IV SOLN
8.0000 mL | Freq: Once | INTRAVENOUS | Status: AC | PRN
  Administered 2021-10-15: 8 mL via INTRAVENOUS

## 2021-10-17 DIAGNOSIS — M0579 Rheumatoid arthritis with rheumatoid factor of multiple sites without organ or systems involvement: Secondary | ICD-10-CM | POA: Diagnosis not present

## 2021-10-17 DIAGNOSIS — M25522 Pain in left elbow: Secondary | ICD-10-CM | POA: Diagnosis not present

## 2021-10-22 DIAGNOSIS — Z961 Presence of intraocular lens: Secondary | ICD-10-CM | POA: Diagnosis not present

## 2021-10-22 DIAGNOSIS — H16223 Keratoconjunctivitis sicca, not specified as Sjogren's, bilateral: Secondary | ICD-10-CM | POA: Diagnosis not present

## 2021-10-22 DIAGNOSIS — Z79899 Other long term (current) drug therapy: Secondary | ICD-10-CM | POA: Diagnosis not present

## 2021-10-22 DIAGNOSIS — H5340 Unspecified visual field defects: Secondary | ICD-10-CM | POA: Diagnosis not present

## 2021-10-23 DIAGNOSIS — I10 Essential (primary) hypertension: Secondary | ICD-10-CM | POA: Diagnosis not present

## 2021-10-23 DIAGNOSIS — I2510 Atherosclerotic heart disease of native coronary artery without angina pectoris: Secondary | ICD-10-CM | POA: Diagnosis not present

## 2021-10-30 ENCOUNTER — Ambulatory Visit: Payer: Medicare HMO | Admitting: Primary Care

## 2021-11-21 ENCOUNTER — Other Ambulatory Visit: Payer: Self-pay | Admitting: Internal Medicine

## 2021-11-21 DIAGNOSIS — Z1231 Encounter for screening mammogram for malignant neoplasm of breast: Secondary | ICD-10-CM

## 2021-11-28 IMAGING — CR DG CHEST 2V
1 series · 2 of 2 positions shown · non-contrast
Comparison: Portable chest 05/06/2020 and earlier.

CLINICAL DATA: 75-year-old female with hypotension.  Near syncope.

EXAM:
CHEST - 2 VIEW

[Series 1: dg chest 2 view · 0.14mm/px · 2 of 2 slices shown]
[im 1/2]
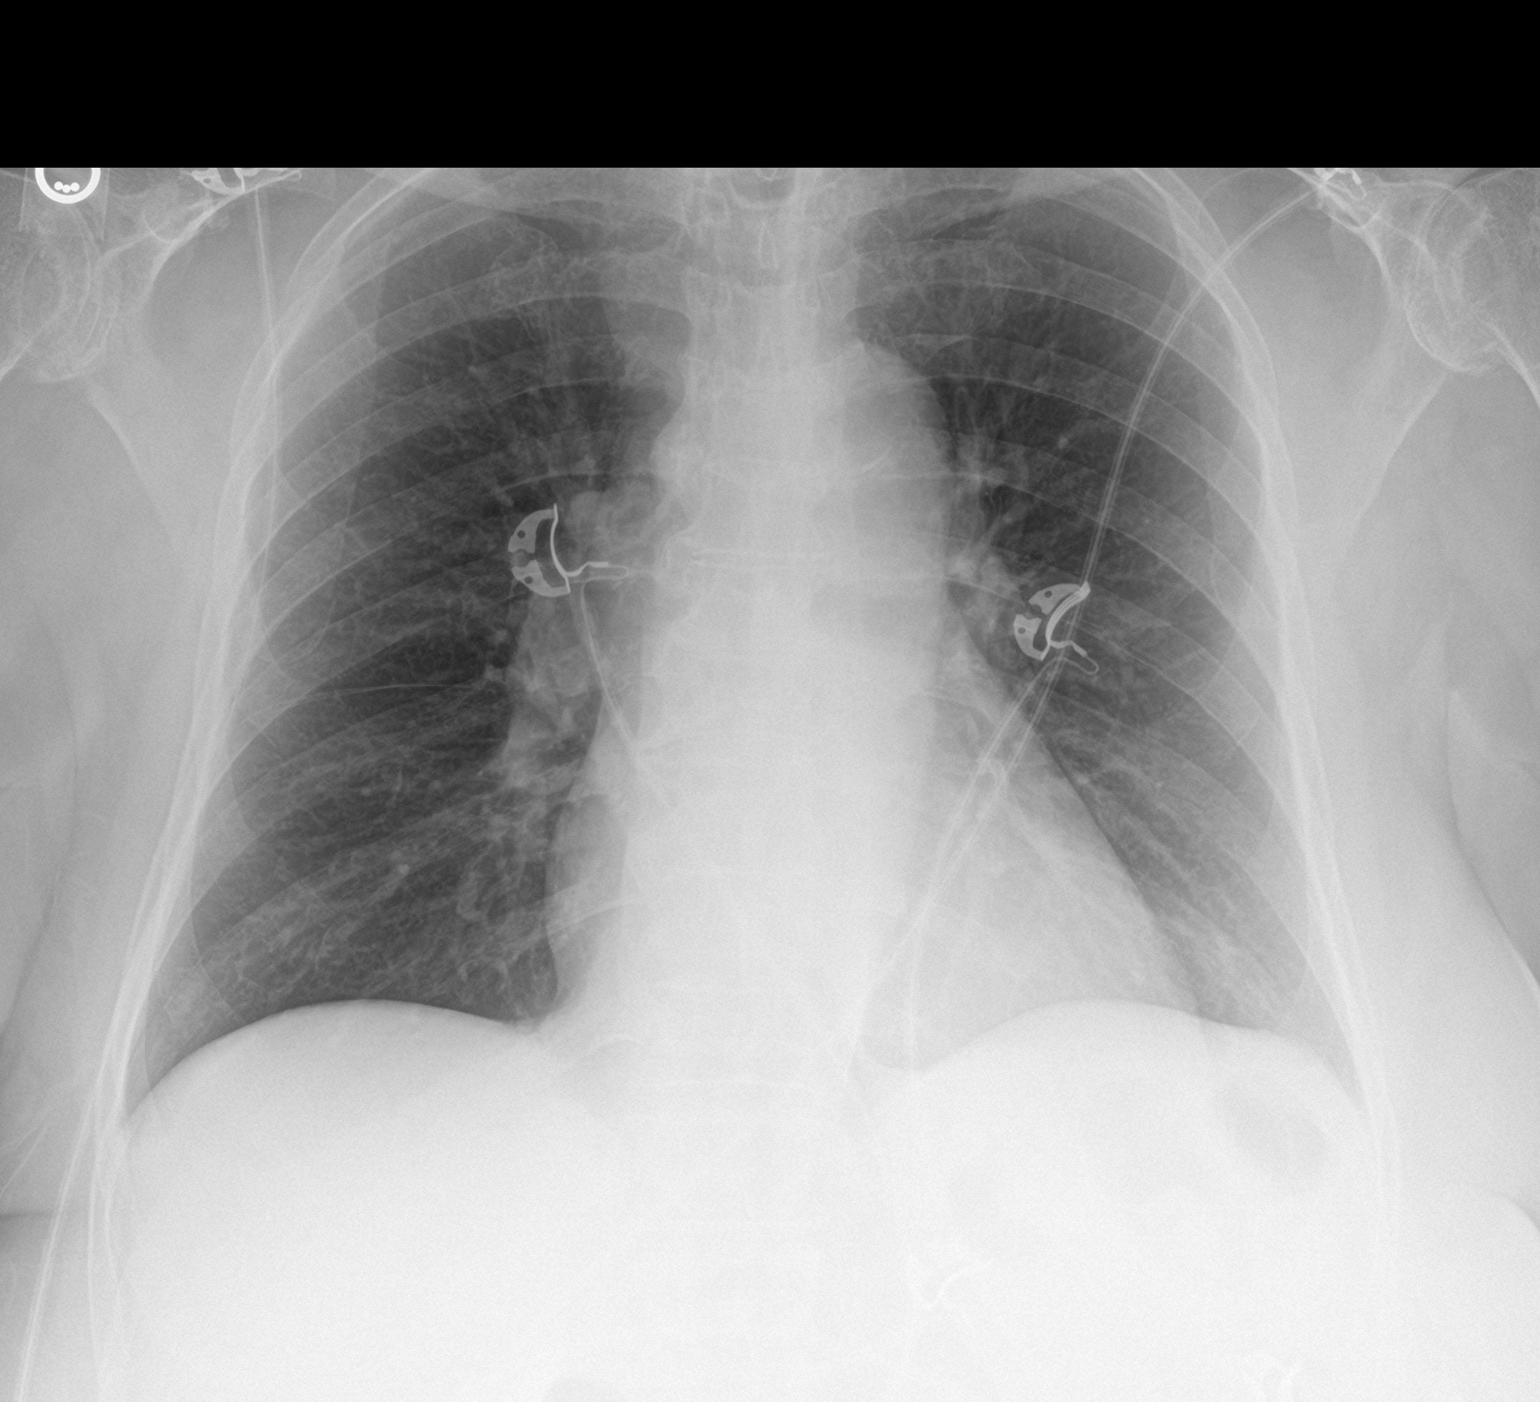
[im 2/2]
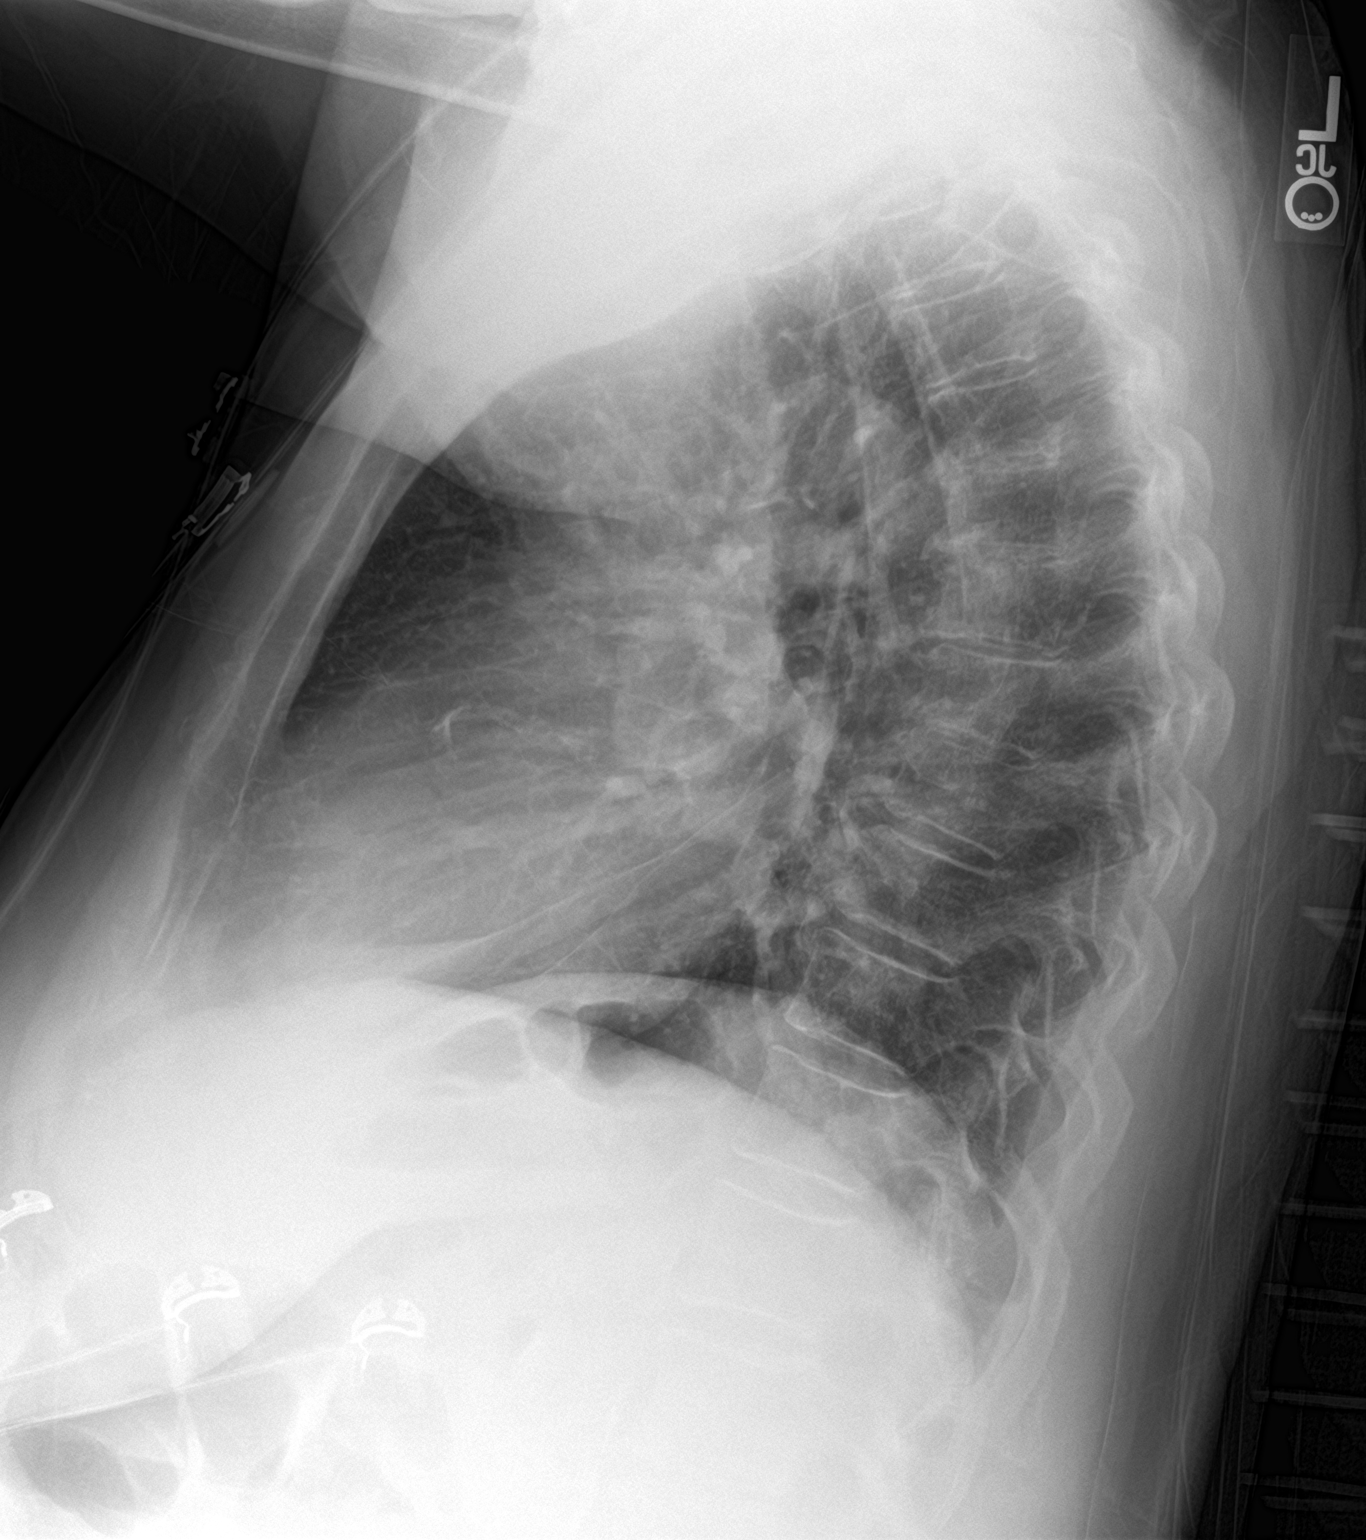

[2 of 2 positions shown; findings below may reference images not displayed]

FINDINGS: Semi upright AP and lateral views of the chest. Lung volumes and
mediastinal contours are stable since 9543 and within normal limits.
Visualized tracheal air column is within normal limits. Both lungs
appear clear. No pneumothorax or pleural effusion. No acute osseous
abnormality identified. Negative visible bowel gas pattern.
IMPRESSION: Negative.  No acute cardiopulmonary abnormality.

## 2021-12-17 DIAGNOSIS — Z0100 Encounter for examination of eyes and vision without abnormal findings: Secondary | ICD-10-CM | POA: Diagnosis not present

## 2021-12-17 DIAGNOSIS — H5340 Unspecified visual field defects: Secondary | ICD-10-CM | POA: Diagnosis not present

## 2021-12-24 DIAGNOSIS — E1165 Type 2 diabetes mellitus with hyperglycemia: Secondary | ICD-10-CM | POA: Diagnosis not present

## 2021-12-24 DIAGNOSIS — N390 Urinary tract infection, site not specified: Secondary | ICD-10-CM | POA: Diagnosis not present

## 2021-12-24 DIAGNOSIS — R300 Dysuria: Secondary | ICD-10-CM | POA: Diagnosis not present

## 2022-01-06 ENCOUNTER — Other Ambulatory Visit: Payer: Self-pay | Admitting: Internal Medicine

## 2022-01-06 DIAGNOSIS — Z1231 Encounter for screening mammogram for malignant neoplasm of breast: Secondary | ICD-10-CM

## 2022-01-08 DIAGNOSIS — I2510 Atherosclerotic heart disease of native coronary artery without angina pectoris: Secondary | ICD-10-CM | POA: Diagnosis not present

## 2022-01-08 DIAGNOSIS — K219 Gastro-esophageal reflux disease without esophagitis: Secondary | ICD-10-CM | POA: Diagnosis not present

## 2022-01-08 DIAGNOSIS — M05721 Rheumatoid arthritis with rheumatoid factor of right elbow without organ or systems involvement: Secondary | ICD-10-CM | POA: Diagnosis not present

## 2022-01-08 DIAGNOSIS — I10 Essential (primary) hypertension: Secondary | ICD-10-CM | POA: Diagnosis not present

## 2022-01-08 DIAGNOSIS — F419 Anxiety disorder, unspecified: Secondary | ICD-10-CM | POA: Diagnosis not present

## 2022-01-08 DIAGNOSIS — M05722 Rheumatoid arthritis with rheumatoid factor of left elbow without organ or systems involvement: Secondary | ICD-10-CM | POA: Diagnosis not present

## 2022-01-08 DIAGNOSIS — E1165 Type 2 diabetes mellitus with hyperglycemia: Secondary | ICD-10-CM | POA: Diagnosis not present

## 2022-01-13 DIAGNOSIS — M05722 Rheumatoid arthritis with rheumatoid factor of left elbow without organ or systems involvement: Secondary | ICD-10-CM | POA: Diagnosis not present

## 2022-01-13 DIAGNOSIS — M05721 Rheumatoid arthritis with rheumatoid factor of right elbow without organ or systems involvement: Secondary | ICD-10-CM | POA: Diagnosis not present

## 2022-01-13 DIAGNOSIS — M25522 Pain in left elbow: Secondary | ICD-10-CM | POA: Diagnosis not present

## 2022-01-13 DIAGNOSIS — Z7960 Long term (current) use of unspecified immunomodulators and immunosuppressants: Secondary | ICD-10-CM | POA: Diagnosis not present

## 2022-01-14 DIAGNOSIS — G8929 Other chronic pain: Secondary | ICD-10-CM | POA: Diagnosis not present

## 2022-01-14 DIAGNOSIS — M19221 Secondary osteoarthritis, right elbow: Secondary | ICD-10-CM | POA: Diagnosis not present

## 2022-01-14 DIAGNOSIS — M19222 Secondary osteoarthritis, left elbow: Secondary | ICD-10-CM | POA: Diagnosis not present

## 2022-01-15 DIAGNOSIS — M05721 Rheumatoid arthritis with rheumatoid factor of right elbow without organ or systems involvement: Secondary | ICD-10-CM | POA: Diagnosis not present

## 2022-01-15 DIAGNOSIS — F419 Anxiety disorder, unspecified: Secondary | ICD-10-CM | POA: Diagnosis not present

## 2022-01-15 DIAGNOSIS — Z23 Encounter for immunization: Secondary | ICD-10-CM | POA: Diagnosis not present

## 2022-01-15 DIAGNOSIS — E119 Type 2 diabetes mellitus without complications: Secondary | ICD-10-CM | POA: Diagnosis not present

## 2022-01-15 DIAGNOSIS — M05722 Rheumatoid arthritis with rheumatoid factor of left elbow without organ or systems involvement: Secondary | ICD-10-CM | POA: Diagnosis not present

## 2022-01-15 DIAGNOSIS — K219 Gastro-esophageal reflux disease without esophagitis: Secondary | ICD-10-CM | POA: Diagnosis not present

## 2022-01-15 DIAGNOSIS — Z6833 Body mass index (BMI) 33.0-33.9, adult: Secondary | ICD-10-CM | POA: Diagnosis not present

## 2022-02-13 ENCOUNTER — Ambulatory Visit
Admission: RE | Admit: 2022-02-13 | Discharge: 2022-02-13 | Disposition: A | Discharge status: Home / Self Care | Payer: Medicare HMO | Source: Ambulatory Visit | Attending: Internal Medicine | Admitting: Internal Medicine

## 2022-02-13 DIAGNOSIS — Z1231 Encounter for screening mammogram for malignant neoplasm of breast: Secondary | ICD-10-CM | POA: Diagnosis not present

## 2022-02-17 DIAGNOSIS — M9905 Segmental and somatic dysfunction of pelvic region: Secondary | ICD-10-CM | POA: Diagnosis not present

## 2022-02-17 DIAGNOSIS — M5136 Other intervertebral disc degeneration, lumbar region: Secondary | ICD-10-CM | POA: Diagnosis not present

## 2022-02-17 DIAGNOSIS — M9903 Segmental and somatic dysfunction of lumbar region: Secondary | ICD-10-CM | POA: Diagnosis not present

## 2022-02-17 DIAGNOSIS — M5416 Radiculopathy, lumbar region: Secondary | ICD-10-CM | POA: Diagnosis not present

## 2022-02-19 DIAGNOSIS — M9905 Segmental and somatic dysfunction of pelvic region: Secondary | ICD-10-CM | POA: Diagnosis not present

## 2022-02-19 DIAGNOSIS — M9903 Segmental and somatic dysfunction of lumbar region: Secondary | ICD-10-CM | POA: Diagnosis not present

## 2022-02-19 DIAGNOSIS — M5416 Radiculopathy, lumbar region: Secondary | ICD-10-CM | POA: Diagnosis not present

## 2022-02-19 DIAGNOSIS — M5136 Other intervertebral disc degeneration, lumbar region: Secondary | ICD-10-CM | POA: Diagnosis not present

## 2022-02-23 DIAGNOSIS — M05721 Rheumatoid arthritis with rheumatoid factor of right elbow without organ or systems involvement: Secondary | ICD-10-CM | POA: Diagnosis not present

## 2022-02-23 DIAGNOSIS — M05722 Rheumatoid arthritis with rheumatoid factor of left elbow without organ or systems involvement: Secondary | ICD-10-CM | POA: Diagnosis not present

## 2022-03-28 DIAGNOSIS — R531 Weakness: Secondary | ICD-10-CM | POA: Diagnosis not present

## 2022-03-28 DIAGNOSIS — E1165 Type 2 diabetes mellitus with hyperglycemia: Secondary | ICD-10-CM | POA: Diagnosis not present

## 2022-03-28 DIAGNOSIS — I10 Essential (primary) hypertension: Secondary | ICD-10-CM | POA: Diagnosis not present

## 2022-03-28 DIAGNOSIS — R1032 Left lower quadrant pain: Secondary | ICD-10-CM | POA: Diagnosis not present

## 2022-03-28 DIAGNOSIS — R1031 Right lower quadrant pain: Secondary | ICD-10-CM | POA: Diagnosis not present

## 2022-03-28 DIAGNOSIS — E119 Type 2 diabetes mellitus without complications: Secondary | ICD-10-CM | POA: Diagnosis not present

## 2022-03-28 DIAGNOSIS — R110 Nausea: Secondary | ICD-10-CM | POA: Diagnosis not present

## 2022-03-28 DIAGNOSIS — M069 Rheumatoid arthritis, unspecified: Secondary | ICD-10-CM | POA: Diagnosis not present

## 2022-04-01 DIAGNOSIS — R1031 Right lower quadrant pain: Secondary | ICD-10-CM | POA: Diagnosis not present

## 2022-04-01 DIAGNOSIS — I10 Essential (primary) hypertension: Secondary | ICD-10-CM | POA: Diagnosis not present

## 2022-04-01 DIAGNOSIS — R531 Weakness: Secondary | ICD-10-CM | POA: Diagnosis not present

## 2022-04-01 DIAGNOSIS — R110 Nausea: Secondary | ICD-10-CM | POA: Diagnosis not present

## 2022-04-01 DIAGNOSIS — R1032 Left lower quadrant pain: Secondary | ICD-10-CM | POA: Diagnosis not present

## 2022-04-01 DIAGNOSIS — E1165 Type 2 diabetes mellitus with hyperglycemia: Secondary | ICD-10-CM | POA: Diagnosis not present

## 2022-04-01 DIAGNOSIS — R195 Other fecal abnormalities: Secondary | ICD-10-CM | POA: Diagnosis not present

## 2022-04-11 DIAGNOSIS — R110 Nausea: Secondary | ICD-10-CM | POA: Diagnosis not present

## 2022-04-11 DIAGNOSIS — Z03818 Encounter for observation for suspected exposure to other biological agents ruled out: Secondary | ICD-10-CM | POA: Diagnosis not present

## 2022-04-11 DIAGNOSIS — R531 Weakness: Secondary | ICD-10-CM | POA: Diagnosis not present

## 2022-04-11 DIAGNOSIS — R6889 Other general symptoms and signs: Secondary | ICD-10-CM | POA: Diagnosis not present

## 2022-05-05 DIAGNOSIS — M0579 Rheumatoid arthritis with rheumatoid factor of multiple sites without organ or systems involvement: Secondary | ICD-10-CM | POA: Diagnosis not present

## 2022-05-21 ENCOUNTER — Telehealth: Payer: Self-pay | Admitting: *Deleted

## 2022-05-21 ENCOUNTER — Encounter: Payer: Self-pay | Admitting: *Deleted

## 2022-05-21 NOTE — Patient Outreach (Signed)
  Care Coordination   Initial Visit Note   05/21/2022 Name: Elaine Cox MRN: [id number] DOB: 06-20-1945  Elaine Cox is a 77 y.o. year old female who sees Tracie Harrier, MD for primary care. I spoke with  [name] by phone today.  What matters to the patients health and wellness today?  Pain management, controlling pain associated with RA.     Goals Addressed             This Visit's Progress    Effective management of RA       Care Coordination Interventions: Evaluation of current treatment plan related to RA and patient's adherence to plan as established by provider Advised patient to Stay active, will restart [employer], using stationary bike at home Reviewed medications with patient and discussed adherence and affordability, state medications are free through Medicaid Reviewed scheduled/upcoming provider appointments including labs tomorrow, AWV on 2/22, ortho on 3/5, cardiology on 3/22, and rheumatology on 6/3  Discussed plans with patient for ongoing care management follow up and provided patient with direct contact information for care management team Screening for signs and symptoms of depression related to chronic disease state  Assessed social determinant of health barriers Discussed monitoring blood sugar and blood pressure daily.  A1C currently at goal at 6.3, blood pressure stable, today 130/78 Discussed pain level, rate 6/10 but tolerable.  Uses ointment daily and Tylenol '500mg'$  daily Assessed need for transportation, state if daughter does not drive her to appointments she drive herself.          SDOH assessments and interventions completed:  Yes  SDOH Interventions Today    Flowsheet Row Most Recent Value  SDOH Interventions   Food Insecurity Interventions Intervention Not Indicated  Housing Interventions Intervention Not Indicated  Transportation Interventions Intervention Not Indicated        Care Coordination Interventions:  Yes,  provided   Follow up plan: Follow up call scheduled for 4/12    Encounter Outcome:  Pt. Visit Completed   Valente David, RN, MSN, [place] Care Management Care Management Coordinator [phone]

## 2022-05-22 DIAGNOSIS — F419 Anxiety disorder, unspecified: Secondary | ICD-10-CM | POA: Diagnosis not present

## 2022-05-22 DIAGNOSIS — K219 Gastro-esophageal reflux disease without esophagitis: Secondary | ICD-10-CM | POA: Diagnosis not present

## 2022-05-22 DIAGNOSIS — M25529 Pain in unspecified elbow: Secondary | ICD-10-CM | POA: Diagnosis not present

## 2022-05-22 DIAGNOSIS — G8929 Other chronic pain: Secondary | ICD-10-CM | POA: Diagnosis not present

## 2022-05-22 DIAGNOSIS — Z6833 Body mass index (BMI) 33.0-33.9, adult: Secondary | ICD-10-CM | POA: Diagnosis not present

## 2022-05-22 DIAGNOSIS — M05722 Rheumatoid arthritis with rheumatoid factor of left elbow without organ or systems involvement: Secondary | ICD-10-CM | POA: Diagnosis not present

## 2022-05-22 DIAGNOSIS — R140 Abdominal distension (gaseous): Secondary | ICD-10-CM | POA: Diagnosis not present

## 2022-05-22 DIAGNOSIS — M05721 Rheumatoid arthritis with rheumatoid factor of right elbow without organ or systems involvement: Secondary | ICD-10-CM | POA: Diagnosis not present

## 2022-05-22 DIAGNOSIS — E1165 Type 2 diabetes mellitus with hyperglycemia: Secondary | ICD-10-CM | POA: Diagnosis not present

## 2022-05-29 DIAGNOSIS — Z79891 Long term (current) use of opiate analgesic: Secondary | ICD-10-CM | POA: Diagnosis not present

## 2022-05-29 DIAGNOSIS — I10 Essential (primary) hypertension: Secondary | ICD-10-CM | POA: Diagnosis not present

## 2022-05-29 DIAGNOSIS — E119 Type 2 diabetes mellitus without complications: Secondary | ICD-10-CM | POA: Diagnosis not present

## 2022-05-29 DIAGNOSIS — M069 Rheumatoid arthritis, unspecified: Secondary | ICD-10-CM | POA: Diagnosis not present

## 2022-05-29 DIAGNOSIS — J019 Acute sinusitis, unspecified: Secondary | ICD-10-CM | POA: Diagnosis not present

## 2022-05-29 DIAGNOSIS — I2510 Atherosclerotic heart disease of native coronary artery without angina pectoris: Secondary | ICD-10-CM | POA: Diagnosis not present

## 2022-05-29 DIAGNOSIS — F32A Depression, unspecified: Secondary | ICD-10-CM | POA: Diagnosis not present

## 2022-05-29 DIAGNOSIS — Z1331 Encounter for screening for depression: Secondary | ICD-10-CM | POA: Diagnosis not present

## 2022-05-29 DIAGNOSIS — Z0000 Encounter for general adult medical examination without abnormal findings: Secondary | ICD-10-CM | POA: Diagnosis not present

## 2022-06-10 DIAGNOSIS — J028 Acute pharyngitis due to other specified organisms: Secondary | ICD-10-CM | POA: Diagnosis not present

## 2022-06-10 DIAGNOSIS — Z96651 Presence of right artificial knee joint: Secondary | ICD-10-CM | POA: Diagnosis not present

## 2022-06-10 DIAGNOSIS — B9789 Other viral agents as the cause of diseases classified elsewhere: Secondary | ICD-10-CM | POA: Diagnosis not present

## 2022-06-10 DIAGNOSIS — E1165 Type 2 diabetes mellitus with hyperglycemia: Secondary | ICD-10-CM | POA: Diagnosis not present

## 2022-06-26 DIAGNOSIS — R8290 Unspecified abnormal findings in urine: Secondary | ICD-10-CM | POA: Diagnosis not present

## 2022-06-26 DIAGNOSIS — R300 Dysuria: Secondary | ICD-10-CM | POA: Diagnosis not present

## 2022-06-26 DIAGNOSIS — R109 Unspecified abdominal pain: Secondary | ICD-10-CM | POA: Diagnosis not present

## 2022-06-30 DIAGNOSIS — R509 Fever, unspecified: Secondary | ICD-10-CM | POA: Diagnosis not present

## 2022-06-30 DIAGNOSIS — I10 Essential (primary) hypertension: Secondary | ICD-10-CM | POA: Diagnosis not present

## 2022-06-30 DIAGNOSIS — M059 Rheumatoid arthritis with rheumatoid factor, unspecified: Secondary | ICD-10-CM | POA: Diagnosis not present

## 2022-06-30 DIAGNOSIS — J069 Acute upper respiratory infection, unspecified: Secondary | ICD-10-CM | POA: Diagnosis not present

## 2022-06-30 DIAGNOSIS — E119 Type 2 diabetes mellitus without complications: Secondary | ICD-10-CM | POA: Diagnosis not present

## 2022-06-30 DIAGNOSIS — I2510 Atherosclerotic heart disease of native coronary artery without angina pectoris: Secondary | ICD-10-CM | POA: Diagnosis not present

## 2022-07-02 DIAGNOSIS — I10 Essential (primary) hypertension: Secondary | ICD-10-CM | POA: Diagnosis not present

## 2022-07-02 DIAGNOSIS — E1165 Type 2 diabetes mellitus with hyperglycemia: Secondary | ICD-10-CM | POA: Diagnosis not present

## 2022-07-02 DIAGNOSIS — I2510 Atherosclerotic heart disease of native coronary artery without angina pectoris: Secondary | ICD-10-CM | POA: Diagnosis not present

## 2022-07-02 DIAGNOSIS — R079 Chest pain, unspecified: Secondary | ICD-10-CM | POA: Diagnosis not present

## 2022-07-02 DIAGNOSIS — F419 Anxiety disorder, unspecified: Secondary | ICD-10-CM | POA: Diagnosis not present

## 2022-07-02 DIAGNOSIS — I214 Non-ST elevation (NSTEMI) myocardial infarction: Secondary | ICD-10-CM | POA: Diagnosis not present

## 2022-07-02 DIAGNOSIS — I6310 Cerebral infarction due to embolism of unspecified precerebral artery: Secondary | ICD-10-CM | POA: Diagnosis not present

## 2022-07-02 DIAGNOSIS — F410 Panic disorder [episodic paroxysmal anxiety] without agoraphobia: Secondary | ICD-10-CM | POA: Diagnosis not present

## 2022-07-09 DIAGNOSIS — I214 Non-ST elevation (NSTEMI) myocardial infarction: Secondary | ICD-10-CM | POA: Diagnosis not present

## 2022-07-09 DIAGNOSIS — I2510 Atherosclerotic heart disease of native coronary artery without angina pectoris: Secondary | ICD-10-CM | POA: Diagnosis not present

## 2022-07-09 NOTE — Progress Notes (Deleted)
07/09/22 10:51 AM   Elaine Cox 1945/12/17 [id number]  Referring provider:  Tracie Harrier, [facility] [place],  [place] [address]  No chief complaint on file.  Urological history  1. High risk hematuria - non-smoker - Non-contrast CT 08/2020 - non-obstructing tiny right renal calculi - cysto 08/2019 - NED - no reports of gross heme - UA (06/2022) 3 RBC's -several UA's over the last year negative for micro heme    2. Nephrolithiasis  - CT renal stone study on 07/03/2021 visualized multiple small nonobstructive right renal calculi. No left-sided calculi, ureteral calculi, or hydronephrosis.  3. Vaginal atrophy  - Managed on OTC vaginal lubricants   4. rUTI's -Contributing factors of age, vaginal atrophy, diabetes and constipation -Documented urine cultures over the last year  June 26, 2022 - no growth  March 28, 2022 - mixed urogenital flora  December 24, 2021 -mixed urogenital flora  September 23, 2021 - E. coli     HPI: Elaine Cox is a 77 y.o.female who presents today for yearly follow up.  KUB ***   PMH: Past Medical History:  Diagnosis Date   Anginal pain ([place]) 06/2016   being followed by dr. Ubaldo Glassing   Anxiety    Arthritis    Osteoarthritis   Arthritis    Rheumatoid   Breast cancer ([place]) 2000   Right Breast - Chemotherapy   Collagen vascular disease ([place])    Rheumatoid Arthritis.   Coronary artery disease    Diabetes mellitus without complication [facility])    Patient takes Metformin   Dyspnea 03/2017   GERD (gastroesophageal reflux disease)    History of abdominal hysterectomy    History of eyelid surgery    History of kidney stones    Hyperlipidemia    Hypertension    Lumbago    Murmur    Myocardial infarction ([place]) 07/2010   Orthopnea 03/2017   Personal history of chemotherapy 2000   right breast ca   Sinus disorder    Stroke [facility]) 2006, 2012   Wears dentures    full upper and lower    Surgical  History: Past Surgical History:  Procedure Laterality Date   ABDOMINAL HYSTERECTOMY     BREAST SURGERY Right    mastectomy    CARDIAC CATHETERIZATION  2012   stent placed   CATARACT EXTRACTION W/PHACO Right 06/24/2017   Procedure: CATARACT EXTRACTION PHACO AND INTRAOCULAR LENS PLACEMENT ([place]) COMPLICATED RIGHT DIABETIC;  Surgeon: Leandrew Koyanagi, MD;  Location: [facility];  Service: Ophthalmology;  Laterality: Right;  Diabetes - oral med   COLONOSCOPY     COLONOSCOPY WITH PROPOFOL N/A 12/08/2017   Procedure: COLONOSCOPY WITH PROPOFOL;  Surgeon: Toledo, Benay Pike, MD;  Location: [facility];  Service: Gastroenterology;  Laterality: N/A;   CORONARY ANGIOPLASTY     CORONARY STENT INTERVENTION N/A 02/24/2018   Procedure: CORONARY STENT INTERVENTION;  Surgeon: [name], MD;  Location: [place] CV LAB;  Service: Cardiovascular;  Laterality: N/A;   CYSTOSCOPY N/A 09/08/2016   Procedure: CYSTOSCOPY;  Surgeon: Ward, Honor Loh, MD;  Location: [facility];  Service: Gynecology;  Laterality: N/A;   EYE SURGERY Left    Cataract Extraction with IOL   KNEE ARTHROPLASTY Right 03/14/2020   Procedure: COMPUTER ASSISTED TOTAL KNEE ARTHROPLASTY;  Surgeon: Dereck Leep, MD;  Location: [facility];  Service: Orthopedics;  Laterality: Right;  FOLLOWING 1ST CASE   KNEE ARTHROSCOPY W/ MENISCAL REPAIR Right    LAPAROSCOPIC BILATERAL SALPINGO OOPHERECTOMY  Bilateral 09/08/2016   Procedure: LAPAROSCOPIC BILATERAL SALPINGO OOPHORECTOMY;  Surgeon: Ward, Honor Loh, MD;  Location: [facility];  Service: Gynecology;  Laterality: Bilateral;   LEFT HEART CATH AND CORONARY ANGIOGRAPHY Left 02/24/2018   Procedure: LEFT HEART CATH AND CORONARY ANGIOGRAPHY;  Surgeon: [name], MD;  Location: [place] [facility];  Service: Cardiovascular;  Laterality: Left;   MASTECTOMY Right 2000   BREAST CA    Home Medications:  Allergies as of 07/10/2022       Reactions   Doxazosin Other (See Comments)    "headache"   Fish Allergy Anaphylaxis, Itching, Nausea Only, Swelling   She states her tongue turns black.   Dicloxacillin Nausea And Vomiting   Has patient had a PCN reaction causing immediate rash, facial/tongue/throat swelling, SOB or lightheadedness with hypotension: No Has patient had a PCN reaction causing severe rash involving mucus membranes or skin necrosis: No Has patient had a PCN reaction that required hospitalization: Unknown Has patient had a PCN reaction occurring within the last 10 years: Unknown If all of the above answers are "NO", then may proceed with Cephalosporin use.   Iodine Hives   Per patient, she had a previous reaction/hives from an iodine injection   Sulfa Antibiotics Swelling   Loss of appetite   Constipation bloating Other reaction(s): Headache   Beta Adrenergic Blockers Other (See Comments)   "Unknown"   Cardura [doxazosin Mesylate]    Headache    Etodolac Itching, Swelling   Sulfamethoxazole-trimethoprim Nausea Only   Azithromycin Itching   Ciprofloxacin Nausea Only, Rash   Clopidogrel Nausea And Vomiting   Colchicine Nausea Only, Other (See Comments)   Dizzness   Glucosamine Nausea And Vomiting, Other (See Comments)   Leflunomide Diarrhea, Itching   Meloxicam Itching   Methotrexate Nausea Only   Zoloft [sertraline] Nausea And Vomiting        Medication List        Accurate as of July 09, 2022 10:51 AM. If you have any questions, ask your nurse or doctor.          Accu-Chek Guide test strip Generic drug: glucose blood   acetaminophen 325 MG tablet Commonly known as: TYLENOL Take 2 tablets (650 mg total) by mouth every 6 (six) hours as needed for mild pain (or Fever >/= 101).   ALPRAZolam 0.25 MG tablet Commonly known as: XANAX Take 0.25 mg by mouth at bedtime.   amLODipine 5 MG tablet Commonly known as: NORVASC Take 5 mg by mouth daily.   aspirin EC 81 MG tablet Take 1 tablet (81 mg total) by mouth daily. Swallow whole.    B-D SINGLE USE SWABS REGULAR Pads   Biotin 800 MCG Tabs Take 800 mcg by mouth daily.   hydrOXYzine 10 MG tablet Commonly known as: ATARAX Take 10 mg by mouth daily.   iron polysaccharides 150 MG capsule Commonly known as: NIFEREX Take 150 mg by mouth daily.   metFORMIN 500 MG tablet Commonly known as: GLUCOPHAGE Take 500 mg by mouth 2 (two) times daily with a meal.   metoprolol succinate 100 MG 24 hr tablet Commonly known as: TOPROL-XL Take 100 mg by mouth daily.   MURINE TEARS FOR DRY EYES OP Apply 1 drop to eye daily as needed (dry eyes).   Muscle Rub 10-15 % Crea Apply 1 application topically as needed for muscle pain.   nitroGLYCERIN 0.4 MG SL tablet Commonly known as: NITROSTAT Place 0.4 mg under the tongue every 5 (five) minutes as needed for  chest pain.   omeprazole 20 MG capsule Commonly known as: PRILOSEC Take 20 mg by mouth daily.   Potassium Chloride ER 20 MEQ Tbcr Take 20 mEq by mouth daily.   rosuvastatin 5 MG tablet Commonly known as: CRESTOR Take 5 mg by mouth every Monday, Wednesday, and Friday.   Tofacitinib Citrate 5 MG Tabs Take 5 mg by mouth 2 (two) times daily.   True Metrix Level 1 Low Soln        Allergies:  Allergies  Allergen Reactions   Doxazosin Other (See Comments)    "headache"   Fish Allergy Anaphylaxis, Itching, Nausea Only and Swelling    She states her tongue turns black.    Dicloxacillin Nausea And Vomiting    Has patient had a PCN reaction causing immediate rash, facial/tongue/throat swelling, SOB or lightheadedness with hypotension: No Has patient had a PCN reaction causing severe rash involving mucus membranes or skin necrosis: No Has patient had a PCN reaction that required hospitalization: Unknown Has patient had a PCN reaction occurring within the last 10 years: Unknown If all of the above answers are "NO", then may proceed with Cephalosporin use.   Iodine Hives    Per patient, she had a previous  reaction/hives from an iodine injection   Sulfa Antibiotics Swelling    Loss of appetite   Constipation bloating Other reaction(s): Headache    Beta Adrenergic Blockers Other (See Comments)    "Unknown"   Cardura [Doxazosin Mesylate]     Headache    Etodolac Itching and Swelling   Sulfamethoxazole-Trimethoprim Nausea Only   Azithromycin Itching   Ciprofloxacin Nausea Only and Rash   Clopidogrel Nausea And Vomiting   Colchicine Nausea Only and Other (See Comments)    Dizzness   Glucosamine Nausea And Vomiting and Other (See Comments)   Leflunomide Diarrhea and Itching   Meloxicam Itching   Methotrexate Nausea Only   Zoloft [Sertraline] Nausea And Vomiting    Family History: Family History  Problem Relation Age of Onset   Diabetes type II Mother    Hypertension Mother    Colon polyps Mother    Breast cancer Maternal Aunt     Social History:  reports that she has never smoked. She has never used smokeless tobacco. She reports that she does not drink alcohol and does not use drugs.   Physical Exam: LMP  (LMP Unknown)   Constitutional:  Well nourished. Alert and oriented, No acute distress. HEENT: [place] AT, moist mucus membranes.  Trachea midline Cardiovascular: No clubbing, cyanosis, or edema. Respiratory: Normal respiratory effort, no increased work of breathing. GU: No CVA tenderness.  No bladder fullness or masses.  Patient with circumcised/uncircumcised phallus. ***Foreskin easily retracted***  Urethral meatus is patent.  No penile discharge. No penile lesions or rashes. Scrotum without lesions, cysts, rashes and/or edema.  Testicles are located scrotally bilaterally. No masses are appreciated in the testicles. Left and right epididymis are normal. Rectal: Patient with  normal sphincter tone. Anus and perineum without scarring or rashes. No rectal masses are appreciated. Prostate is approximately *** grams, *** nodules are appreciated. Seminal vesicles are normal. Neurologic:  Grossly intact, no focal deficits, moving all 4 extremities. Psychiatric: Normal mood and affect.    Laboratory Data: Serum creatinine (05/2022) 0.7 Hemoglobin A1c (05/2022) 6.7 Urinalysis w/Microscopic Order: [id number] Component Ref Range & Units 13 d ago  Color Colorless, Straw, Light Yellow, Yellow, Dark Yellow Yellow  Clarity Clear Cloudy Abnormal   Specific Gravity 1.005 - 1.030 1.022  pH, Urine 5.0 - 8.0 6.0  Protein, Urinalysis Negative mg/dL 1+ Abnormal   Glucose, Urinalysis Negative mg/dL Trace Abnormal   Ketones, Urinalysis Negative mg/dL Negative  Blood, Urinalysis Negative Negative  Nitrite, Urinalysis Negative Negative  Leukocyte Esterase, Urinalysis Negative Negative  Hyaline Casts, Urinalysis /lpf 14  Bilirubin, Urinalysis Negative Negative  Urobilinogen, Urinalysis 0.2 - 1.0 mg/dL 0.2  WBC, UA <=5 /hpf 4  Red Blood Cells, Urinalysis <=3 /hpf 3  Bacteria, Urinalysis 0 - 5 /hpf 0-5  Squamous Epithelial Cells, Urinalysis /hpf 11  Resulting Agency [facility] - LAB   Specimen Collected: 06/26/22 11:29   Performed by: [place]: 06/26/22 12:00  Received From: [facility]  Result Received: 07/09/22 10:51  I have reviewed the labs.   Pertinent Imaging  *** I have independently reviewed the films.  See HPI.    Assessment & Plan:    Right renal stones - KUB ***  2. rUTI's -She has had several urine cultures over the last year for vague urinary symptoms which are likely more due to vaginal atrophy  -Discussed the difference between colonization and recurrent urinary tract infections  -Discussed that vaginal estrogen cream is the treatment for vaginal atrophy  3. High risk hematuria -work up in May 2021 - bilateral nephrolithiasis -CT renal stone as above  -no reports of gross heme -recent UA's negative for micro heme  No follow-ups on file.  Christee Mervine, [place] [address], [place], [place] [address] [phone]

## 2022-07-10 ENCOUNTER — Ambulatory Visit: Payer: Medicare HMO | Admitting: Urology

## 2022-07-15 DIAGNOSIS — J329 Chronic sinusitis, unspecified: Secondary | ICD-10-CM | POA: Diagnosis not present

## 2022-07-15 DIAGNOSIS — J301 Allergic rhinitis due to pollen: Secondary | ICD-10-CM | POA: Diagnosis not present

## 2022-07-15 DIAGNOSIS — R519 Headache, unspecified: Secondary | ICD-10-CM | POA: Diagnosis not present

## 2022-07-15 DIAGNOSIS — H903 Sensorineural hearing loss, bilateral: Secondary | ICD-10-CM | POA: Diagnosis not present

## 2022-07-15 DIAGNOSIS — H6063 Unspecified chronic otitis externa, bilateral: Secondary | ICD-10-CM | POA: Diagnosis not present

## 2022-07-16 ENCOUNTER — Other Ambulatory Visit: Payer: Self-pay | Admitting: Otolaryngology

## 2022-07-16 DIAGNOSIS — J0141 Acute recurrent pansinusitis: Secondary | ICD-10-CM

## 2022-07-18 ENCOUNTER — Ambulatory Visit: Payer: Self-pay | Admitting: *Deleted

## 2022-07-18 NOTE — Patient Outreach (Signed)
  Care Coordination   Follow Up Visit Note   07/18/2022 Name: Elaine Cox MRN: [id number] DOB: 1945-07-14  [name] is a 77 y.o. year old female who sees Barbette Reichmann, MD for primary care. I spoke with  [name] by phone today.  What matters to the patients health and wellness today?  Finding out reason for chronic ENT infections.    Goals Addressed             This Visit's Progress    Effective management of RA   On track    Care Coordination Interventions: Evaluation of current treatment plan related to RA and patient's adherence to plan as established by provider Advised patient to Stay active, will restart [employer], using stationary bike at home Reviewed medications with patient and discussed adherence and affordability, state medications are free through Medicaid Reviewed scheduled/upcoming provider appointments including Stress test on 4/16, CT on 4/18, and will follow up with ENT after CT is resulted.  rheumatology on 6/3 and PCP on 6/25  Discussed plans with patient for ongoing care management follow up and provided patient with direct contact information for care management team Discussed pain level, does not report a level today, state it is more soreness.  Taking Tramadol for relief. Assessed need for transportation, state if daughter does not drive her to appointments she drive herself.       Relief of ear/throat issues       Care Coordination Interventions: Evaluation of current treatment plan related to possible chronic sinusitis and patient's adherence to plan as established by provider Provided education to patient re: upcoming CT scan Reviewed scheduled/upcoming provider appointments including CT on 4/18, ENT appointment to follow Discussed ongoing use of prescribed nasal spray for relief         SDOH assessments and interventions completed:  No     Care Coordination Interventions:  Yes, provided   Interventions Today     Flowsheet Row Most Recent Value  Chronic Disease   Chronic disease during today's visit Other  [RA and ENT issues]  General Interventions   General Interventions Discussed/Reviewed General Interventions Reviewed, Doctor Visits  Doctor Visits Discussed/Reviewed Doctor Visits Reviewed, PCP, Specialist  PCP/Specialist Visits Compliance with follow-up visit        Follow up plan: Follow up call scheduled for 4/30    Encounter Outcome:  Pt. Visit Completed   Elaine Durie, RN,MSN, [facility] Care Management Care Management Coordinator [phone]

## 2022-07-22 DIAGNOSIS — I2510 Atherosclerotic heart disease of native coronary artery without angina pectoris: Secondary | ICD-10-CM | POA: Diagnosis not present

## 2022-07-22 DIAGNOSIS — E7849 Other hyperlipidemia: Secondary | ICD-10-CM | POA: Diagnosis not present

## 2022-07-22 DIAGNOSIS — E1165 Type 2 diabetes mellitus with hyperglycemia: Secondary | ICD-10-CM | POA: Diagnosis not present

## 2022-07-22 DIAGNOSIS — I10 Essential (primary) hypertension: Secondary | ICD-10-CM | POA: Diagnosis not present

## 2022-07-22 DIAGNOSIS — I214 Non-ST elevation (NSTEMI) myocardial infarction: Secondary | ICD-10-CM | POA: Diagnosis not present

## 2022-07-23 ENCOUNTER — Ambulatory Visit
Admission: RE | Admit: 2022-07-23 | Discharge: 2022-07-23 | Disposition: A | Discharge status: Home / Self Care | Payer: Medicare HMO | Source: Ambulatory Visit | Attending: Otolaryngology | Admitting: Otolaryngology

## 2022-07-23 DIAGNOSIS — I6522 Occlusion and stenosis of left carotid artery: Secondary | ICD-10-CM | POA: Diagnosis not present

## 2022-07-23 DIAGNOSIS — J342 Deviated nasal septum: Secondary | ICD-10-CM | POA: Diagnosis not present

## 2022-07-23 DIAGNOSIS — J0141 Acute recurrent pansinusitis: Secondary | ICD-10-CM

## 2022-07-23 DIAGNOSIS — J3489 Other specified disorders of nose and nasal sinuses: Secondary | ICD-10-CM | POA: Diagnosis not present

## 2022-07-23 DIAGNOSIS — J329 Chronic sinusitis, unspecified: Secondary | ICD-10-CM | POA: Diagnosis not present

## 2022-08-05 ENCOUNTER — Ambulatory Visit: Payer: Self-pay | Admitting: *Deleted

## 2022-08-05 NOTE — Patient Outreach (Signed)
  Care Coordination   Follow Up Visit Note   08/05/2022 Name: Elaine Cox MRN: [id number] DOB: 07/23/1945  KAYE LUOMA is a 77 y.o. year old female who sees Barbette Reichmann, MD for primary care. I spoke with  [name] by phone today.  What matters to the patients health and wellness today?  Getting through high pollen season to have relief of allergies.    Goals Addressed             This Visit's Progress    Effective management of RA   On track    Care Coordination Interventions: Evaluation of current treatment plan related to RA and patient's adherence to plan as established by provider Advised patient to Stay active, will restart [employer], using stationary bike at home Reviewed medications with patient and discussed adherence and affordability, state medications are free through [facility] Reviewed scheduled/upcoming provider appointments including rheumatology on 6/3 (does not feel provider is beneficial, considering canceling appointment) and PCP on 6/25  Discussed plans with patient for ongoing care management follow up and provided patient with direct contact information for care management team       Relief of ear/throat issues   On track    Care Coordination Interventions: Evaluation of current treatment plan related to possible chronic sinusitis and patient's adherence to plan as established by provider Provided education to patient re: upcoming CT scan Reviewed scheduled/upcoming provider appointments including CT done on 4/18, showed severe allergies Discussed ongoing use of prescribed nasal spray for relief - report not liking the way it feels to use         SDOH assessments and interventions completed:  No     Care Coordination Interventions:  Yes, provided   Interventions Today    Flowsheet Row Most Recent Value  Chronic Disease   Chronic disease during today's visit Other  [RA and sinus allergies]  General Interventions   General  Interventions Discussed/Reviewed General Interventions Reviewed, Doctor Visits  Doctor Visits Discussed/Reviewed Doctor Visits Reviewed, Specialist  [RA, state PCP is managing pain, feels specialist is a waste of copay, will consider canceling follow up]  PCP/Specialist Visits Compliance with follow-up visit  [ENT appointment completed]  Exercise Interventions   Exercise Discussed/Reviewed Physical Activity, Exercise Reviewed  Physical Activity Discussed/Reviewed Gym, Physical Activity Reviewed  [trying to slowly increase activity and walking at the [facility]  Education Interventions   Education Provided Provided Education  Provided Verbal Education On Blood Sugar Monitoring, Other, When to see the doctor  [blood pressure 127/77 and blood sugar 157 today]  Mental Health Interventions   Mental Health Discussed/Reviewed Mental Health Reviewed, Coping Strategies, Grief and Loss  [State she has had several close deaths in the last 2 years, declines offer to have CSW call for counseling]       Follow up plan: Follow up call scheduled for 6/27    Encounter Outcome:  Pt. Visit Completed   Kemper Durie, RN, MSN, [facility] Care Management Care Management Coordinator [phone]

## 2022-08-08 DIAGNOSIS — M9905 Segmental and somatic dysfunction of pelvic region: Secondary | ICD-10-CM | POA: Diagnosis not present

## 2022-08-08 DIAGNOSIS — M9903 Segmental and somatic dysfunction of lumbar region: Secondary | ICD-10-CM | POA: Diagnosis not present

## 2022-08-08 DIAGNOSIS — M5416 Radiculopathy, lumbar region: Secondary | ICD-10-CM | POA: Diagnosis not present

## 2022-08-08 DIAGNOSIS — M5136 Other intervertebral disc degeneration, lumbar region: Secondary | ICD-10-CM | POA: Diagnosis not present

## 2022-08-14 DIAGNOSIS — M9903 Segmental and somatic dysfunction of lumbar region: Secondary | ICD-10-CM | POA: Diagnosis not present

## 2022-08-14 DIAGNOSIS — M5136 Other intervertebral disc degeneration, lumbar region: Secondary | ICD-10-CM | POA: Diagnosis not present

## 2022-08-14 DIAGNOSIS — M9905 Segmental and somatic dysfunction of pelvic region: Secondary | ICD-10-CM | POA: Diagnosis not present

## 2022-08-14 DIAGNOSIS — M5416 Radiculopathy, lumbar region: Secondary | ICD-10-CM | POA: Diagnosis not present

## 2022-08-21 DIAGNOSIS — M5416 Radiculopathy, lumbar region: Secondary | ICD-10-CM | POA: Diagnosis not present

## 2022-08-21 DIAGNOSIS — M9905 Segmental and somatic dysfunction of pelvic region: Secondary | ICD-10-CM | POA: Diagnosis not present

## 2022-08-21 DIAGNOSIS — M5136 Other intervertebral disc degeneration, lumbar region: Secondary | ICD-10-CM | POA: Diagnosis not present

## 2022-08-21 DIAGNOSIS — M9903 Segmental and somatic dysfunction of lumbar region: Secondary | ICD-10-CM | POA: Diagnosis not present

## 2022-09-04 DIAGNOSIS — M0579 Rheumatoid arthritis with rheumatoid factor of multiple sites without organ or systems involvement: Secondary | ICD-10-CM | POA: Diagnosis not present

## 2022-09-04 DIAGNOSIS — J019 Acute sinusitis, unspecified: Secondary | ICD-10-CM | POA: Diagnosis not present

## 2022-09-04 DIAGNOSIS — M5136 Other intervertebral disc degeneration, lumbar region: Secondary | ICD-10-CM | POA: Diagnosis not present

## 2022-09-04 DIAGNOSIS — M9903 Segmental and somatic dysfunction of lumbar region: Secondary | ICD-10-CM | POA: Diagnosis not present

## 2022-09-04 DIAGNOSIS — M9905 Segmental and somatic dysfunction of pelvic region: Secondary | ICD-10-CM | POA: Diagnosis not present

## 2022-09-04 DIAGNOSIS — Z79891 Long term (current) use of opiate analgesic: Secondary | ICD-10-CM | POA: Diagnosis not present

## 2022-09-04 DIAGNOSIS — M5416 Radiculopathy, lumbar region: Secondary | ICD-10-CM | POA: Diagnosis not present

## 2022-09-04 DIAGNOSIS — I10 Essential (primary) hypertension: Secondary | ICD-10-CM | POA: Diagnosis not present

## 2022-09-04 DIAGNOSIS — R8290 Unspecified abnormal findings in urine: Secondary | ICD-10-CM | POA: Diagnosis not present

## 2022-09-04 DIAGNOSIS — R7309 Other abnormal glucose: Secondary | ICD-10-CM | POA: Diagnosis not present

## 2022-09-04 DIAGNOSIS — Z0289 Encounter for other administrative examinations: Secondary | ICD-10-CM | POA: Diagnosis not present

## 2022-09-04 DIAGNOSIS — I2510 Atherosclerotic heart disease of native coronary artery without angina pectoris: Secondary | ICD-10-CM | POA: Diagnosis not present

## 2022-09-07 DIAGNOSIS — N390 Urinary tract infection, site not specified: Secondary | ICD-10-CM | POA: Diagnosis not present

## 2022-09-07 DIAGNOSIS — Z8639 Personal history of other endocrine, nutritional and metabolic disease: Secondary | ICD-10-CM | POA: Diagnosis not present

## 2022-09-07 DIAGNOSIS — J0140 Acute pansinusitis, unspecified: Secondary | ICD-10-CM | POA: Diagnosis not present

## 2022-09-09 DIAGNOSIS — K219 Gastro-esophageal reflux disease without esophagitis: Secondary | ICD-10-CM | POA: Diagnosis not present

## 2022-09-09 DIAGNOSIS — E119 Type 2 diabetes mellitus without complications: Secondary | ICD-10-CM | POA: Diagnosis not present

## 2022-09-09 DIAGNOSIS — M069 Rheumatoid arthritis, unspecified: Secondary | ICD-10-CM | POA: Diagnosis not present

## 2022-09-09 DIAGNOSIS — F418 Other specified anxiety disorders: Secondary | ICD-10-CM | POA: Diagnosis not present

## 2022-09-09 DIAGNOSIS — N390 Urinary tract infection, site not specified: Secondary | ICD-10-CM | POA: Diagnosis not present

## 2022-09-09 DIAGNOSIS — B9620 Unspecified Escherichia coli [E. coli] as the cause of diseases classified elsewhere: Secondary | ICD-10-CM | POA: Diagnosis not present

## 2022-09-09 DIAGNOSIS — I10 Essential (primary) hypertension: Secondary | ICD-10-CM | POA: Diagnosis not present

## 2022-10-02 ENCOUNTER — Ambulatory Visit: Payer: Self-pay | Admitting: *Deleted

## 2022-10-02 NOTE — Patient Outreach (Signed)
  Care Coordination   Follow Up Visit Note   10/02/2022 Name: MEAGON DUSKIN MRN: [id number] DOB: 1945/07/28  LYNDSAY TALAMANTE is a 77 y.o. year old female who sees Barbette Reichmann, MD for primary care. I spoke with  [name] by phone today.  What matters to the patients health and wellness today?  Keep RA pain controlled.  Denies any urgent concerns, encouraged to contact this care manager with questions.     Goals Addressed             This Visit's Progress    COMPLETED: Effective management of RA   On track    Care Coordination Interventions: Evaluation of current treatment plan related to RA and patient's adherence to plan as established by provider Advised patient to Stay active, will restart [employer], using stationary bike at home Reviewed medications with patient and discussed adherence and affordability, state medications are free through [facility] Reviewed scheduled/upcoming provider appointments including rheumatology on 6/3 (does not feel provider is beneficial, considering canceling appointment) and PCP on 6/25  Discussed plans with patient for ongoing care management follow up and provided patient with direct contact information for care management team  Goal met, report current management of RA      COMPLETED: Relief of ear/throat issues   On track    Care Coordination Interventions: Evaluation of current treatment plan related to possible chronic sinusitis and patient's adherence to plan as established by provider Provided education to patient re: upcoming CT scan Reviewed scheduled/upcoming provider appointments including CT done on 4/18, showed severe allergies Discussed ongoing use of prescribed nasal spray for relief - report not liking the way it feels to use  Seasonal allergies are fine, goal met        SDOH assessments and interventions completed:  No     Care Coordination Interventions:  Yes, provided   Interventions Today    Flowsheet  Row Most Recent Value  Chronic Disease   Chronic disease during today's visit Other  [pain related to RA]  General Interventions   General Interventions Discussed/Reviewed Doctor Visits, General Interventions Reviewed  Doctor Visits Discussed/Reviewed Doctor Visits Reviewed, PCP, Specialist  [cardiology 7/24, PCP in October]  PCP/Specialist Visits Compliance with follow-up visit  Education Interventions   Education Provided Provided Education  Provided Verbal Education On When to see the doctor, Other  [Report pain currently managed, using Tramadol.  Staying out of the heat, increasing gym time to 3 days a week]       Follow up plan: No further intervention required.   Encounter Outcome:  Pt. Visit Completed   Kemper Durie, RN, MSN, [facility] Care Management Care Management Coordinator [phone]

## 2022-10-15 DIAGNOSIS — B9620 Unspecified Escherichia coli [E. coli] as the cause of diseases classified elsewhere: Secondary | ICD-10-CM | POA: Diagnosis not present

## 2022-10-15 DIAGNOSIS — N390 Urinary tract infection, site not specified: Secondary | ICD-10-CM | POA: Diagnosis not present

## 2022-10-22 ENCOUNTER — Telehealth: Payer: Self-pay

## 2022-10-22 NOTE — Telephone Encounter (Signed)
Pt states she as an appt on 7/26 for dysuria and hematuria. She would like to know if she needs an xray prior?  Please advise.

## 2022-10-28 DIAGNOSIS — R0602 Shortness of breath: Secondary | ICD-10-CM | POA: Diagnosis not present

## 2022-10-28 DIAGNOSIS — I25119 Atherosclerotic heart disease of native coronary artery with unspecified angina pectoris: Secondary | ICD-10-CM | POA: Diagnosis not present

## 2022-10-29 DIAGNOSIS — R079 Chest pain, unspecified: Secondary | ICD-10-CM | POA: Diagnosis not present

## 2022-10-29 DIAGNOSIS — I2510 Atherosclerotic heart disease of native coronary artery without angina pectoris: Secondary | ICD-10-CM | POA: Diagnosis not present

## 2022-10-29 DIAGNOSIS — I6310 Cerebral infarction due to embolism of unspecified precerebral artery: Secondary | ICD-10-CM | POA: Diagnosis not present

## 2022-10-29 DIAGNOSIS — E1165 Type 2 diabetes mellitus with hyperglycemia: Secondary | ICD-10-CM | POA: Diagnosis not present

## 2022-10-29 DIAGNOSIS — I214 Non-ST elevation (NSTEMI) myocardial infarction: Secondary | ICD-10-CM | POA: Diagnosis not present

## 2022-10-29 DIAGNOSIS — E7849 Other hyperlipidemia: Secondary | ICD-10-CM | POA: Diagnosis not present

## 2022-10-29 DIAGNOSIS — I10 Essential (primary) hypertension: Secondary | ICD-10-CM | POA: Diagnosis not present

## 2022-10-30 NOTE — Progress Notes (Unsigned)
10/31/22 7:26 PM   Elaine Cox 1945-08-23 [id number]  Referring provider:  Barbette Reichmann, MD [address] [facility] [place],  [place] [address]  Urological history  1. High risk hematuria - non-smoker - Non-contrast CT 08/2020 - non-obstructing tiny right renal calculi - cysto 08/2019 - NED - no reports of gross heme - UA negative for micro heme - 05/2021   2. Nephrolithiasis  - CT renal stone study 08/2020 - three right renal stone ~ 4 mm in size - KUB on 06/21/2021 visualized Stable multiple tiny right renal calculi. Stable vascular calcification overlying the upper pole of the left kidney or upper pole left renal staghorn type calculus.  - CT renal stone study on 07/03/2021 visualized multiple small nonobstructive right renal calculi. No left-sided calculi, ureteral calculi, or hydronephrosis.  3. Vaginal atrophy  - Managed on OTC vaginal lubricants   4. rUTI's -Contributing factors of age, vaginal atrophy, diabetes and constipation -Documented positive urine cultures over the last year  October 15, 2022 mixed urogenital flora  Sep 04, 2018 for E. Coli  June 26, 2022 no growth  March 28, 2022 mixed urogenital flora  December 24, 2021 mixed urogenital flora     Chief Complaint  Patient presents with   Dysuria   Hematuria     HPI: Elaine Cox is a 77 y.o.female who presents today for a 2 week follow-up dysuria and hematuria.   Previous records reviewed.    She had a urinalysis with her PCP in October 15, 2022 which was yellow cloudy, specific gravity 1.023, pH 5.5, trace protein, 3+ heme, 7 WBCs, 159 red blood cells, 0-5 bacteria and 25 squamous epithelial cells.  Urine culture grew out mixed urogenital flora.    She is having intermittent episodes of right flank pain and an instance of intense suprapubic pain that resolved quickly.  She is wondering if she passed a stone.    UA amber slightly cloudy, trace ketone, specific gravity  1.020, pH 5.0, 1+ protein, 0-5 WBCs, 0-2 RBCs, greater than 10 epithelial cells, hyaline casts present, granular casts present, mucus threads present and many bacteria.  KUB bilateral nephrolithiasis, no definite ureteral stones noted    PMH: Past Medical History:  Diagnosis Date   Anginal pain (HCC) 06/2016   being followed by dr. Lady Gary   Anxiety    Arthritis    Osteoarthritis   Arthritis    Rheumatoid   Breast cancer (HCC) 2000   Right Breast - Chemotherapy   Collagen vascular disease (HCC)    Rheumatoid Arthritis.   Coronary artery disease    Diabetes mellitus without complication [facility])    Patient takes Metformin   Dyspnea 03/2017   GERD (gastroesophageal reflux disease)    History of abdominal hysterectomy    History of eyelid surgery    History of kidney stones    Hyperlipidemia    Hypertension    Lumbago    Murmur    Myocardial infarction [facility]) 07/2010   Orthopnea 03/2017   Personal history of chemotherapy 2000   right breast ca   Sinus disorder    Stroke [facility]) 2006, 2012   Wears dentures    full upper and lower    Surgical History: Past Surgical History:  Procedure Laterality Date   ABDOMINAL HYSTERECTOMY     BREAST SURGERY Right    mastectomy    CARDIAC CATHETERIZATION  2012   stent placed   CATARACT EXTRACTION W/PHACO Right 06/24/2017   Procedure: CATARACT  EXTRACTION PHACO AND INTRAOCULAR LENS PLACEMENT (IOC) COMPLICATED RIGHT DIABETIC;  Surgeon: Lockie Mola, MD;  Location: [facility];  Service: Ophthalmology;  Laterality: Right;  Diabetes - oral med   COLONOSCOPY     COLONOSCOPY WITH PROPOFOL N/A 12/08/2017   Procedure: COLONOSCOPY WITH PROPOFOL;  Surgeon: Toledo, Boykin Nearing, MD;  Location: [facility] ENDOSCOPY;  Service: Gastroenterology;  Laterality: N/A;   CORONARY ANGIOPLASTY     CORONARY STENT INTERVENTION N/A 02/24/2018   Procedure: CORONARY STENT INTERVENTION;  Surgeon: Marcina Millard, MD;  Location: [facility] INVASIVE CV LAB;  Service:  Cardiovascular;  Laterality: N/A;   CYSTOSCOPY N/A 09/08/2016   Procedure: CYSTOSCOPY;  Surgeon: Ward, Elenora Fender, MD;  Location: [facility] ORS;  Service: Gynecology;  Laterality: N/A;   EYE SURGERY Left    Cataract Extraction with IOL   KNEE ARTHROPLASTY Right 03/14/2020   Procedure: COMPUTER ASSISTED TOTAL KNEE ARTHROPLASTY;  Surgeon: Donato Heinz, MD;  Location: [facility] ORS;  Service: Orthopedics;  Laterality: Right;  FOLLOWING 1ST CASE   KNEE ARTHROSCOPY W/ MENISCAL REPAIR Right    LAPAROSCOPIC BILATERAL SALPINGO OOPHERECTOMY Bilateral 09/08/2016   Procedure: LAPAROSCOPIC BILATERAL SALPINGO OOPHORECTOMY;  Surgeon: Ward, Elenora Fender, MD;  Location: [facility] ORS;  Service: Gynecology;  Laterality: Bilateral;   LEFT HEART CATH AND CORONARY ANGIOGRAPHY Left 02/24/2018   Procedure: LEFT HEART CATH AND CORONARY ANGIOGRAPHY;  Surgeon: Marcina Millard, MD;  Location: [facility] CV LAB;  Service: Cardiovascular;  Laterality: Left;   MASTECTOMY Right 2000   BREAST CA    Home Medications:  Allergies as of 10/31/2022       Reactions   Doxazosin Other (See Comments)   "headache"   Fish Allergy Anaphylaxis, Itching, Nausea Only, Swelling   She states her tongue turns black.   Dicloxacillin Nausea And Vomiting   Has patient had a PCN reaction causing immediate rash, facial/tongue/throat swelling, SOB or lightheadedness with hypotension: No Has patient had a PCN reaction causing severe rash involving mucus membranes or skin necrosis: No Has patient had a PCN reaction that required hospitalization: Unknown Has patient had a PCN reaction occurring within the last 10 years: Unknown If all of the above answers are "NO", then may proceed with Cephalosporin use.   Iodine Hives   Per patient, she had a previous reaction/hives from an iodine injection   Sulfa Antibiotics Swelling   Loss of appetite   Constipation bloating Other reaction(s): Headache   Beta Adrenergic Blockers Other (See Comments)   "Unknown"    Cardura [doxazosin Mesylate]    Headache    Etodolac Itching, Swelling   Sulfamethoxazole-trimethoprim Nausea Only   Azithromycin Itching   Ciprofloxacin Nausea Only, Rash   Clopidogrel Nausea And Vomiting   Colchicine Nausea Only, Other (See Comments)   Dizzness   Glucosamine Nausea And Vomiting, Other (See Comments)   Leflunomide Diarrhea, Itching   Meloxicam Itching   Methotrexate Nausea Only   Zoloft [sertraline] Nausea And Vomiting        Medication List        Accurate as of October 31, 2022  7:26 PM. If you have any questions, ask your nurse or doctor.          STOP taking these medications    hydrOXYzine 10 MG tablet Commonly known as: ATARAX Stopped by: Ellieanna Funderburg   metFORMIN 500 MG tablet Commonly known as: GLUCOPHAGE Stopped by: Kirston Luty   Tofacitinib Citrate 5 MG Tabs Stopped by: Niley Helbig       TAKE these medications  Accu-Chek Guide test strip Generic drug: glucose blood   acetaminophen 325 MG tablet Commonly known as: TYLENOL Take 2 tablets (650 mg total) by mouth every 6 (six) hours as needed for mild pain (or Fever >/= 101).   acidophilus Caps capsule Take by mouth in the morning and at bedtime.   ALPRAZolam 0.25 MG tablet Commonly known as: XANAX Take 0.25 mg by mouth at bedtime.   amLODipine 5 MG tablet Commonly known as: NORVASC Take 5 mg by mouth daily.   aspirin EC 81 MG tablet Take 1 tablet (81 mg total) by mouth daily. Swallow whole.   B-D SINGLE USE SWABS REGULAR Pads   Biotin 800 MCG Tabs Take 800 mcg by mouth daily.   diphenhydrAMINE 50 MG tablet Commonly known as: BENADRYL Take one hour prior to CT scan Started by: Gal Smolinski   glimepiride 4 MG tablet Commonly known as: AMARYL Take 4 mg by mouth daily.   iron polysaccharides 150 MG capsule Commonly known as: NIFEREX Take 150 mg by mouth daily.   metoprolol succinate 100 MG 24 hr tablet Commonly known as: TOPROL-XL Take 100 mg by  mouth daily. Pt taking 50mg    MURINE TEARS FOR DRY EYES OP Apply 1 drop to eye daily as needed (dry eyes).   Muscle Rub 10-15 % Crea Apply 1 application topically as needed for muscle pain.   nitroGLYCERIN 0.4 MG SL tablet Commonly known as: NITROSTAT Place 0.4 mg under the tongue daily as needed for chest pain.   omeprazole 20 MG capsule Commonly known as: PRILOSEC Take 20 mg by mouth daily.   Potassium Chloride ER 20 MEQ Tbcr Take 20 mEq by mouth daily.   predniSONE 50 MG tablet Commonly known as: DELTASONE Take the one tablet 13 hours, 7 hours and 1 hours prior to the CT Urogram Started by: Lucah Petta   rosuvastatin 5 MG tablet Commonly known as: CRESTOR Take 5 mg by mouth every Monday, Wednesday, and Friday.   True Metrix Level 1 Low Soln        Allergies:  Allergies  Allergen Reactions   Doxazosin Other (See Comments)    "headache"   Fish Allergy Anaphylaxis, Itching, Nausea Only and Swelling    She states her tongue turns black.    Dicloxacillin Nausea And Vomiting    Has patient had a PCN reaction causing immediate rash, facial/tongue/throat swelling, SOB or lightheadedness with hypotension: No Has patient had a PCN reaction causing severe rash involving mucus membranes or skin necrosis: No Has patient had a PCN reaction that required hospitalization: Unknown Has patient had a PCN reaction occurring within the last 10 years: Unknown If all of the above answers are "NO", then may proceed with Cephalosporin use.   Iodine Hives    Per patient, she had a previous reaction/hives from an iodine injection   Sulfa Antibiotics Swelling    Loss of appetite   Constipation bloating Other reaction(s): Headache    Beta Adrenergic Blockers Other (See Comments)    "Unknown"   Cardura [Doxazosin Mesylate]     Headache    Etodolac Itching and Swelling   Sulfamethoxazole-Trimethoprim Nausea Only   Azithromycin Itching   Ciprofloxacin Nausea Only and Rash    Clopidogrel Nausea And Vomiting   Colchicine Nausea Only and Other (See Comments)    Dizzness   Glucosamine Nausea And Vomiting and Other (See Comments)   Leflunomide Diarrhea and Itching   Meloxicam Itching   Methotrexate Nausea Only   Zoloft [Sertraline] Nausea  And Vomiting    Family History: Family History  Problem Relation Age of Onset   Diabetes type II Mother    Hypertension Mother    Colon polyps Mother    Breast cancer Maternal Aunt     Social History:  reports that she has never smoked. She has never used smokeless tobacco. She reports that she does not drink alcohol and does not use drugs.   Physical Exam: BP (!) 147/82   Pulse 65   Wt 176 lb (79.8 kg)   LMP  (LMP Unknown)   BMI 32.19 kg/m   Constitutional:  Well nourished. Alert and oriented, No acute distress. HEENT: [place] AT, moist mucus membranes.  Trachea midline, no masses. Cardiovascular: No clubbing, cyanosis, or edema. Respiratory: Normal respiratory effort, no increased work of breathing. Neurologic: Grossly intact, no focal deficits, moving all 4 extremities. Psychiatric: Normal mood and affect.    Laboratory Data: Comprehensive Metabolic Panel (CMP) Order: [id number] Component Ref Range & Units 1 mo ago  Glucose 70 - 110 mg/dL 409 High   Sodium 811 - 145 mmol/L 138  Potassium 3.6 - 5.1 mmol/L 3.6  Chloride 97 - 109 mmol/L 101  Carbon Dioxide (CO2) 22.0 - 32.0 mmol/L 29.9  Urea Nitrogen (BUN) 7 - 25 mg/dL 10  Creatinine 0.6 - 1.1 mg/dL 0.8  Glomerular Filtration Rate (eGFR) >60 mL/min/1.73sq m 76  Comment: CKD-EPI (2021) does not include patient's race in the calculation of eGFR.  Monitoring changes of plasma creatinine and eGFR over time is useful for monitoring kidney function.  Interpretive Ranges for eGFR (CKD-EPI 2021):  eGFR:       >60 mL/min/1.73 sq. m - Normal eGFR:       30-59 mL/min/1.73 sq. m - Moderately Decreased eGFR:       15-29 mL/min/1.73 sq. m  - Severely  Decreased eGFR:       < 15 mL/min/1.73 sq. m  - Kidney Failure   Note: These eGFR calculations do not apply in acute situations when eGFR is changing rapidly or patients on dialysis.  Calcium 8.7 - 10.3 mg/dL 9.2  AST 8 - 39 U/L 17  ALT 5 - 38 U/L 9  Alk Phos (alkaline Phosphatase) 34 - 104 U/L 81  Albumin 3.5 - 4.8 g/dL 3.8  Bilirubin, Total 0.3 - 1.2 mg/dL 0.9  Protein, Total 6.1 - 7.9 g/dL 6.8  A/G Ratio 1.0 - 5.0 gm/dL 1.3  Resulting Agency [facility]   Specimen Collected: 09/04/22 09:24   Performed by: [facility] Last Resulted: 09/04/22 13:18  Received From: [facility]  Result Received: 10/02/22 10:27    Hemoglobin A1C Order: [id number] Component Ref Range & Units 1 mo ago  Hemoglobin A1C 4.2 - 5.6 % 6.6 High   Average Blood Glucose (Calc) mg/dL 213  Resulting Agency [facility]  Narrative Performed by [facility] Normal Range:    4.2 - 5.6% Increased Risk:  5.7 - 6.4% Diabetes:        >= 6.5% Glycemic Control for adults with diabetes:  <7%    Specimen Collected: 09/04/22 09:24   Performed by: [facility] Resulted: 09/04/22 10:45  Received From: [facility]  Result Received: 10/02/22 10:27    Urinalysis Results for orders placed or performed in visit on 10/31/22  Microscopic Examination   Urine  Result Value Ref Range   WBC, UA 0-5 0 -  5 /hpf   RBC, Urine 0-2 0 - 2 /hpf   Epithelial Cells (non renal) >10 (A) 0 - 10 /hpf   Casts Present (A) None seen /lpf   Cast Type Hyaline casts N/A   Mucus, UA Present (A) Not Estab.   Bacteria, UA Many (A) None seen/Few  Urinalysis, Complete  Result Value Ref Range   Specific Gravity, UA 1.020 1.005 - 1.030   pH, UA 5.0 5.0 - 7.5   Color, UA Amber (A) Yellow   Appearance Ur Hazy (A) Clear   Leukocytes,UA Negative Negative   Protein,UA 1+ (A) Negative/Trace   Glucose, UA Negative Negative    Ketones, UA Trace (A) Negative   RBC, UA Negative Negative   Bilirubin, UA Negative Negative   Urobilinogen, Ur 0.2 0.2 - 1.0 mg/dL   Nitrite, UA Negative Negative   Microscopic Examination See below:     I have reviewed the labs.    Pertinent Imaging  CLINICAL DATA:  77 year old female with history of hematuria.   EXAM: ABDOMEN - 1 VIEW   COMPARISON:  07/03/2021   FINDINGS: The bowel gas pattern is normal. Similar appearance of multiple punctate calcific densities projecting over the interpolar region of the right kidney. Indeterminate proximally 7 mm calcific density projecting over the upper pole the left kidney. Pelvic phleboliths are again visualized.   IMPRESSION: 1. Possible new approximately 7 mm left superior nephrolithiasis. 2. Similar appearing multifocal punctate right interpolar nephrolithiasis.     Electronically Signed   By: Marliss Coots M.D.   On: 11/08/2022 09:07 I have independently reviewed the films.  See HPI.    Assessment & Plan:    Right renal stones - KUB w/ right renal stone  2. rUTI's -continue OTC preventative strategies    3. High risk hematuria -work up in May 2021 - bilateral nephrolithiasis -no reports of gross heme -recent UA positive for micro heme -we discussed that the incidence of large microscopic hematuria may have been caused by the passage of a stone, but we cannot be sure.  AUA guidelines recommend a CT urogram and cystoscopy for further evaluation and we can pursue that now or continue to monitor her urine for episodes of persistent heme and conduct the CT at that time -she would like to have the CTU now, so we will go ahead and order it now -she has a history of possible iodine allergies, so I have sent in script for prednisone and Benadryl  -CT urogram pending   Return in about 3 weeks (around 11/21/2022) for CTU report .  [facility] [address], [address] [place], [place]  [address] [phone]

## 2022-10-31 ENCOUNTER — Other Ambulatory Visit: Payer: Self-pay | Admitting: *Deleted

## 2022-10-31 ENCOUNTER — Ambulatory Visit: Admission: RE | Admit: 2022-10-31 | Payer: Medicare HMO | Source: Ambulatory Visit | Admitting: Urology

## 2022-10-31 ENCOUNTER — Ambulatory Visit
Admission: RE | Admit: 2022-10-31 | Discharge: 2022-10-31 | Disposition: A | Discharge status: Home / Self Care | Payer: Medicare HMO | Attending: Urology | Admitting: Urology

## 2022-10-31 ENCOUNTER — Ambulatory Visit: Payer: Medicare HMO | Admitting: Urology

## 2022-10-31 ENCOUNTER — Encounter: Payer: Self-pay | Admitting: Urology

## 2022-10-31 VITALS — BP 147/82 | HR 65 | Wt 176.0 lb

## 2022-10-31 DIAGNOSIS — R319 Hematuria, unspecified: Secondary | ICD-10-CM

## 2022-10-31 DIAGNOSIS — N200 Calculus of kidney: Secondary | ICD-10-CM

## 2022-10-31 DIAGNOSIS — R3129 Other microscopic hematuria: Secondary | ICD-10-CM | POA: Diagnosis not present

## 2022-10-31 DIAGNOSIS — I878 Other specified disorders of veins: Secondary | ICD-10-CM | POA: Diagnosis not present

## 2022-10-31 DIAGNOSIS — Z87440 Personal history of urinary (tract) infections: Secondary | ICD-10-CM

## 2022-10-31 LAB — URINALYSIS, COMPLETE
Bilirubin, UA: NEGATIVE
Glucose, UA: NEGATIVE
Leukocytes,UA: NEGATIVE
Nitrite, UA: NEGATIVE
RBC, UA: NEGATIVE
Specific Gravity, UA: 1.02 (ref 1.005–1.030)
Urobilinogen, Ur: 0.2 mg/dL (ref 0.2–1.0)
pH, UA: 5 (ref 5.0–7.5)

## 2022-10-31 LAB — MICROSCOPIC EXAMINATION: Epithelial Cells (non renal): >10 /hpf — AB (ref 0–10)

## 2022-10-31 MED ORDER — DIPHENHYDRAMINE HCL 50 MG PO TABS
ORAL_TABLET | ORAL | 0 refills | Status: DC
Start: 2022-10-31 — End: 2023-03-18

## 2022-10-31 MED ORDER — PREDNISONE 50 MG PO TABS
ORAL_TABLET | ORAL | 0 refills | Status: DC
Start: 2022-10-31 — End: 2022-12-17

## 2022-10-31 NOTE — Telephone Encounter (Signed)
Pt had appt today to see SM. She had KUB prior.

## 2022-11-07 DIAGNOSIS — R509 Fever, unspecified: Secondary | ICD-10-CM | POA: Diagnosis not present

## 2022-11-07 DIAGNOSIS — J209 Acute bronchitis, unspecified: Secondary | ICD-10-CM | POA: Diagnosis not present

## 2022-11-07 DIAGNOSIS — J019 Acute sinusitis, unspecified: Secondary | ICD-10-CM | POA: Diagnosis not present

## 2022-11-07 DIAGNOSIS — Z03818 Encounter for observation for suspected exposure to other biological agents ruled out: Secondary | ICD-10-CM | POA: Diagnosis not present

## 2022-11-07 DIAGNOSIS — R051 Acute cough: Secondary | ICD-10-CM | POA: Diagnosis not present

## 2022-11-07 DIAGNOSIS — B9689 Other specified bacterial agents as the cause of diseases classified elsewhere: Secondary | ICD-10-CM | POA: Diagnosis not present

## 2022-11-10 DIAGNOSIS — H5340 Unspecified visual field defects: Secondary | ICD-10-CM | POA: Diagnosis not present

## 2022-11-10 DIAGNOSIS — Z0100 Encounter for examination of eyes and vision without abnormal findings: Secondary | ICD-10-CM | POA: Diagnosis not present

## 2022-11-10 DIAGNOSIS — H26491 Other secondary cataract, right eye: Secondary | ICD-10-CM | POA: Diagnosis not present

## 2022-11-10 DIAGNOSIS — Z961 Presence of intraocular lens: Secondary | ICD-10-CM | POA: Diagnosis not present

## 2022-11-12 ENCOUNTER — Ambulatory Visit
Admission: RE | Admit: 2022-11-12 | Discharge: 2022-11-12 | Disposition: A | Discharge status: Home / Self Care | Payer: Medicare HMO | Source: Ambulatory Visit | Attending: Urology | Admitting: Urology

## 2022-11-12 DIAGNOSIS — H524 Presbyopia: Secondary | ICD-10-CM | POA: Diagnosis not present

## 2022-11-12 DIAGNOSIS — N200 Calculus of kidney: Secondary | ICD-10-CM | POA: Insufficient documentation

## 2022-11-12 DIAGNOSIS — R3129 Other microscopic hematuria: Secondary | ICD-10-CM | POA: Diagnosis not present

## 2022-11-12 DIAGNOSIS — N289 Disorder of kidney and ureter, unspecified: Secondary | ICD-10-CM | POA: Diagnosis not present

## 2022-11-12 MED ORDER — IOHEXOL 300 MG/ML  SOLN
100.0000 mL | Freq: Once | INTRAMUSCULAR | Status: AC | PRN
  Administered 2022-11-12: 100 mL via INTRAVENOUS

## 2022-11-17 NOTE — Progress Notes (Unsigned)
11/18/22 10:11 AM   Elaine Cox [birth year] [id number]  Referring provider:  Barbette Reichmann, MD [address] [facility] [place],  [place] [address]  Urological history  1. High risk hematuria - non-smoker - Non-contrast CT 08/2020 - non-obstructing tiny right renal calculi - cysto 08/2019 - NED   2. Nephrolithiasis  - CT renal stone study 08/2020 - three right renal stone ~ 4 mm in size - KUB on 06/21/2021 visualized Stable multiple tiny right renal calculi. Stable vascular calcification overlying the upper pole of the left kidney or upper pole left renal staghorn type calculus.  - CT renal stone study on 07/03/2021 visualized multiple small nonobstructive right renal calculi. No left-sided calculi, ureteral calculi, or hydronephrosis.  3. Vaginal atrophy  - Managed on OTC vaginal lubricants   4. rUTI's -Contributing factors of age, vaginal atrophy, diabetes and constipation -Documented positive urine cultures over the last year  October 15, 2022 mixed urogenital flora  Sep 04, 2018 for E. Coli  June 26, 2022 no growth  March 28, 2022 mixed urogenital flora     Chief Complaint  Patient presents with   CT SCAN report    HPI: Elaine Cox is a 77 y.o.female who presents today for review of CT scan results.  Previous records reviewed.    At her visit on 07/26/2-024, she had a urinalysis with her PCP in October 15, 2022 which was yellow cloudy, specific gravity 1.023, pH 5.5, trace protein, 3+ heme, 7 WBCs, 159 red blood cells, 0-5 bacteria and 25 squamous epithelial cells.  Urine culture grew out mixed urogenital flora.    She is having intermittent episodes of right flank pain and an instance of intense suprapubic pain that resolved quickly.  She is wondering if she passed a stone.   UA amber slightly cloudy, trace ketone, specific gravity 1.020, pH 5.0, 1+ protein, 0-5 WBCs, 0-2 RBCs, greater than 10 epithelial cells, hyaline casts present, granular  casts present, mucus threads present and many bacteria.  KUB bilateral nephrolithiasis, no definite ureteral stones noted.  CT urogram performed on 11/12/2022 revealed 3 nonobstructing right renal stones.  Right ureter poorly opacified.  She continues to have burning with urination, but she is attributing that mostly to GSM versus UTI.  She does not feel comfortable applying vaginal estrogen cream because her history of breast cancer.  She uses KY jelly from time to time.  Her right-sided flank pain has abated.  Patient denies any modifying or aggravating factors.  Patient denies any gross hematuria, dysuria or suprapubic/flank pain.  Patient denies any fevers, chills, nausea or vomiting.    PMH: Past Medical History:  Diagnosis Date   Anginal pain (HCC) 06/2016   being followed by dr. Lady Gary   Anxiety    Arthritis    Osteoarthritis   Arthritis    Rheumatoid   Breast cancer (HCC) 2000   Right Breast - Chemotherapy   Collagen vascular disease (HCC)    Rheumatoid Arthritis.   Coronary artery disease    Diabetes mellitus without complication [facility])    Patient takes Metformin   Dyspnea 03/2017   GERD (gastroesophageal reflux disease)    History of abdominal hysterectomy    History of eyelid surgery    History of kidney stones    Hyperlipidemia    Hypertension    Lumbago    Murmur    Myocardial infarction [facility]) 07/2010   Orthopnea 03/2017   Personal history of chemotherapy 2000   right breast  ca   Sinus disorder    Stroke [facility]) 2006, 2012   Wears dentures    full upper and lower    Surgical History: Past Surgical History:  Procedure Laterality Date   ABDOMINAL HYSTERECTOMY     BREAST SURGERY Right    mastectomy    CARDIAC CATHETERIZATION  2012   stent placed   CATARACT EXTRACTION W/PHACO Right 06/24/2017   Procedure: CATARACT EXTRACTION PHACO AND INTRAOCULAR LENS PLACEMENT (IOC) COMPLICATED RIGHT DIABETIC;  Surgeon: Lockie Mola, MD;  Location: [facility];  Service: Ophthalmology;  Laterality: Right;  Diabetes - oral med   COLONOSCOPY     COLONOSCOPY WITH PROPOFOL N/A 12/08/2017   Procedure: COLONOSCOPY WITH PROPOFOL;  Surgeon: Toledo, Boykin Nearing, MD;  Location: [facility] END[facility]COPY;  Service: Gastroenterology;  Laterality: N/A;   CORONARY ANGIOPLASTY     CORONARY STENT INTERVENTION N/A 02/24/2018   Procedure: CORONARY STENT INTERVENTION;  Surgeon: Marcina Millard, MD;  Location: [facility];  Service: Cardiovascular;  Laterality: N/A;   CYSTOSCOPY N/A 09/08/2016   Procedure: CYSTOSCOPY;  Surgeon: Ward, Elenora Fender, MD;  Location: [facility] ORS;  Service: Gynecology;  Laterality: N/A;   EYE SURGERY Left    Cataract Extraction with IOL   KNEE ARTHROPLASTY Right 03/14/2020   Procedure: COMPUTER ASSISTED TOTAL KNEE ARTHROPLASTY;  Surgeon: Donato Heinz, MD;  Location: [facility] ORS;  Service: Orthopedics;  Laterality: Right;  FOLLOWING 1ST CASE   KNEE ARTHROSCOPY W/ MENISCAL REPAIR Right    LAPAROSCOPIC BILATERAL SALPINGO OOPHERECTOMY Bilateral 09/08/2016   Procedure: LAPAROSCOPIC BILATERAL SALPINGO OOPHORECTOMY;  Surgeon: Ward, Elenora Fender, MD;  Location: [facility] ORS;  Service: Gynecology;  Laterality: Bilateral;   LEFT HEART CATH AND CORONARY ANGIOGRAPHY Left 02/24/2018   Procedure: LEFT HEART CATH AND CORONARY ANGIOGRAPHY;  Surgeon: Marcina Millard, MD;  Location: [facility];  Service: Cardiovascular;  Laterality: Left;   MASTECTOMY Right 2000   BREAST CA    Home Medications:  Allergies as of 11/18/2022       Reactions   Doxazosin Other (See Comments)   "headache"   Fish Allergy Anaphylaxis, Itching, Nausea Only, Swelling   She states her tongue turns black.   Dicloxacillin Nausea And Vomiting   Has patient had a PCN reaction causing immediate rash, facial/tongue/throat swelling, SOB or lightheadedness with hypotension: No Has patient had a PCN reaction causing severe rash involving mucus membranes or skin necrosis: No Has patient  had a PCN reaction that required hospitalization: Unknown Has patient had a PCN reaction occurring within the last 10 years: Unknown If all of the above answers are "NO", then may proceed with Cephalosporin use.   Iodine Hives   Per patient, she had a previous reaction/hives from an iodine injection   Sulfa Antibiotics Swelling   Loss of appetite   Constipation bloating Other reaction(s): Headache   Beta Adrenergic Blockers Other (See Comments)   "Unknown"   Cardura [doxazosin Mesylate]    Headache    Etodolac Itching, Swelling   Sulfamethoxazole-trimethoprim Nausea Only   Azithromycin Itching   Ciprofloxacin Nausea Only, Rash   Clopidogrel Nausea And Vomiting   Colchicine Nausea Only, Other (See Comments)   Dizzness   Glucosamine Nausea And Vomiting, Other (See Comments)   Leflunomide Diarrhea, Itching   Meloxicam Itching   Methotrexate Nausea Only   Zoloft [sertraline] Nausea And Vomiting        Medication List        Accurate as of November 18, 2022 10:11 AM. If you  have any questions, ask your nurse or doctor.          Accu-Chek Guide test strip Generic drug: glucose blood   acetaminophen 325 MG tablet Commonly known as: TYLENOL Take 2 tablets (650 mg total) by mouth every 6 (six) hours as needed for mild pain (or Fever >/= 101).   acidophilus Caps capsule Take by mouth in the morning and at bedtime.   ALPRAZolam 0.25 MG tablet Commonly known as: XANAX Take 0.25 mg by mouth at bedtime.   amLODipine 5 MG tablet Commonly known as: NORVASC Take 5 mg by mouth daily.   aspirin EC 81 MG tablet Take 1 tablet (81 mg total) by mouth daily. Swallow whole.   B-D SINGLE USE SWABS REGULAR Pads   Biotin 800 MCG Tabs Take 800 mcg by mouth daily.   diphenhydrAMINE 50 MG tablet Commonly known as: BENADRYL Take one hour prior to CT scan   glimepiride 4 MG tablet Commonly known as: AMARYL Take 4 mg by mouth daily.   iron polysaccharides 150 MG capsule Commonly  known as: NIFEREX Take 150 mg by mouth daily.   metoprolol succinate 100 MG 24 hr tablet Commonly known as: TOPROL-XL Take 100 mg by mouth daily. Pt taking 50mg    MURINE TEARS FOR DRY EYES OP Apply 1 drop to eye daily as needed (dry eyes).   Muscle Rub 10-15 % Crea Apply 1 application topically as needed for muscle pain.   nitroGLYCERIN 0.4 MG SL tablet Commonly known as: NITROSTAT Place 0.4 mg under the tongue daily as needed for chest pain.   omeprazole 20 MG capsule Commonly known as: PRILOSEC Take 20 mg by mouth daily.   Potassium Chloride ER 20 MEQ Tbcr Take 20 mEq by mouth daily.   predniSONE 50 MG tablet Commonly known as: DELTASONE Take the one tablet 13 hours, 7 hours and 1 hours prior to the CT Urogram   rosuvastatin 5 MG tablet Commonly known as: CRESTOR Take 5 mg by mouth every Monday, Wednesday, and Friday.   True Metrix Level 1 Low Soln        Allergies:  Allergies  Allergen Reactions   Doxazosin Other (See Comments)    "headache"   Fish Allergy Anaphylaxis, Itching, Nausea Only and Swelling    She states her tongue turns black.    Dicloxacillin Nausea And Vomiting    Has patient had a PCN reaction causing immediate rash, facial/tongue/throat swelling, SOB or lightheadedness with hypotension: No Has patient had a PCN reaction causing severe rash involving mucus membranes or skin necrosis: No Has patient had a PCN reaction that required hospitalization: Unknown Has patient had a PCN reaction occurring within the last 10 years: Unknown If all of the above answers are "NO", then may proceed with Cephalosporin use.   Iodine Hives    Per patient, she had a previous reaction/hives from an iodine injection   Sulfa Antibiotics Swelling    Loss of appetite   Constipation bloating Other reaction(s): Headache    Beta Adrenergic Blockers Other (See Comments)    "Unknown"   Cardura [Doxazosin Mesylate]     Headache    Etodolac Itching and Swelling    Sulfamethoxazole-Trimethoprim Nausea Only   Azithromycin Itching   Ciprofloxacin Nausea Only and Rash   Clopidogrel Nausea And Vomiting   Colchicine Nausea Only and Other (See Comments)    Dizzness   Glucosamine Nausea And Vomiting and Other (See Comments)   Leflunomide Diarrhea and Itching   Meloxicam Itching  Methotrexate Nausea Only   Zoloft [Sertraline] Nausea And Vomiting    Family History: Family History  Problem Relation Age of Onset   Diabetes type II Mother    Hypertension Mother    Colon polyps Mother    Breast cancer Maternal Aunt     Social History:  reports that she has never smoked. She has never used smokeless tobacco. She reports that she does not drink alcohol and does not use drugs.   Physical Exam: BP 109/67   Pulse 69   Ht 5' 2.5" (1.588 m)   Wt 173 lb (78.5 kg)   LMP  (LMP Unknown)   BMI 31.14 kg/m   Constitutional:  Well nourished. Alert and oriented, No acute distress. HEENT: [place] AT, moist mucus membranes.  Trachea midline, no masses. Cardiovascular: No clubbing, cyanosis, or edema. Respiratory: Normal respiratory effort, no increased work of breathing. Neurologic: Grossly intact, no focal deficits, moving all 4 extremities. Psychiatric: Normal mood and affect.    Laboratory Data: N/A  Pertinent Imaging  CLINICAL DATA:  Microscopic hematuria and nephrolithiasis. Right renal stone.   EXAM: CT ABDOMEN AND PELVIS WITHOUT AND WITH CONTRAST   TECHNIQUE: Multidetector CT imaging of the abdomen and pelvis was performed following the standard protocol before and following the bolus administration of intravenous contrast.   RADIATION DOSE REDUCTION: This exam was performed according to the departmental dose-optimization program which includes automated exposure control, adjustment of the mA and/or kV according to patient size and/or use of iterative reconstruction technique.   CONTRAST:  OMNIPAQUE IOHEXOL 300 MG/ML  SOLN   COMPARISON:   07/03/2021.   FINDINGS: Lower chest: Lung bases are clear. Heart is mildly enlarged. No pericardial or pleural effusion. Distal esophagus is grossly unremarkable.   Hepatobiliary: Liver and gallbladder are unremarkable. No biliary ductal dilatation.   Pancreas: Negative.   Spleen: Negative.   Adrenals/Urinary Tract: Adrenal glands are unremarkable. Tiny right renal stones. Subcentimeter low-attenuation lesions in the right kidney, too small to characterize. No specific follow-up necessary. Kidneys are otherwise unremarkable. Ureters are decompressed. Proximal/mid right ureter is poorly opacified, limiting evaluation. Otherwise, no filling defects in the opacified portions of the intrarenal collecting systems, ureters or bladder.   Stomach/Bowel: Stomach, small bowel and colon are unremarkable. Appendix is not readily visualized.   Vascular/Lymphatic: Atherosclerotic calcification of the aorta. No pathologically enlarged lymph nodes.   Reproductive: Hysterectomy.  No adnexal mass.   Other: No free fluid. Mesenteries and peritoneum are unremarkable. Elevated left hemidiaphragm.   Musculoskeletal: No worrisome lytic or sclerotic lesions. Old L4 compression fracture.   IMPRESSION: 1. Tiny right renal stones. 2. Proximal/mid right ureter is poorly opacified, limiting evaluation. No additional findings to explain the patient's hematuria. 3.  Aortic atherosclerosis (ICD10-I70.0).     Electronically Signed   By: Leanna Battles M.D.   On: 11/17/2022 09:42 I have independently reviewed the films.  See HPI.    Assessment & Plan:    Right renal stones - KUB w/ right renal stone -CT urogram with tiny right renal stones, no hydro  2. rUTI's -continue OTC preventative strategies    3. High risk hematuria -work up in May 2021 - bilateral nephrolithiasis -no reports of gross heme -recent UA positive for micro heme w/ negative culture (RBC's 159, urine MUF on 10/15/2022)   -CT urogram noted tiny right renal stones, I recommended follow-up cystoscopy to complete hematuria workup -Explained that the cystoscopy is a safe and common diagnostic test performed by Dr. Lonna Cobb in  the office.  It consist of using a thin, lighted tube to look directly inside the bladder and urethra to evaluate the anatomy.  The procedure is brief, typically taking about 5 minutes. -This will enable [place] to assess bladder health and rule out other bladder conditions (stricture disease, stones, cancer, etc.)  -Advised the patient that there are no restrictions to eating or drinking prior to the cystoscopy -They can continue to take all of their medications as prescribed -They can drive themselves to and from the appointment -I explained that during the procedure, the area around the urethra will be cleansed thoroughly, topical anesthetic will be applied to numb your urethra, the thin tube is then gently inserted through the urethra into your bladder while fluid flows through the tube to the bladder to enable better visualization -I explained the procedure is usually not painful, however there may be some discomfort (pinching feeling), and they may feel an urge to urinate, coolness or fullness in the bladder and then the cystoscope is removed -After the cystoscopy, I advised them that they may experience urinary frequency, hematuria, dysuria which will resolve within 24 to 48 hours -Reviewed red flag signs (fever, bright red blood or blood clots in the urine, abdominal pain or difficulty urinating) and to contact the office immediately or seek treatment in the ED if they should experience any of these -Dr. Lonna Cobb will discuss the results of the cystoscopy at the time of the procedure   Return for repeat cysto for worsening micro heme .  [facility] [address] [place], [place] [address] [phone]

## 2022-11-18 ENCOUNTER — Encounter: Payer: Self-pay | Admitting: Urology

## 2022-11-18 ENCOUNTER — Ambulatory Visit: Payer: Medicare HMO | Admitting: Urology

## 2022-11-18 VITALS — BP 109/67 | HR 69 | Ht 62.5 in | Wt 173.0 lb

## 2022-11-18 DIAGNOSIS — N390 Urinary tract infection, site not specified: Secondary | ICD-10-CM

## 2022-11-18 DIAGNOSIS — N200 Calculus of kidney: Secondary | ICD-10-CM | POA: Diagnosis not present

## 2022-11-18 DIAGNOSIS — R3129 Other microscopic hematuria: Secondary | ICD-10-CM | POA: Diagnosis not present

## 2022-11-18 DIAGNOSIS — Z87440 Personal history of urinary (tract) infections: Secondary | ICD-10-CM

## 2022-11-28 DIAGNOSIS — R319 Hematuria, unspecified: Secondary | ICD-10-CM | POA: Diagnosis not present

## 2022-11-28 DIAGNOSIS — R110 Nausea: Secondary | ICD-10-CM | POA: Diagnosis not present

## 2022-11-28 DIAGNOSIS — F418 Other specified anxiety disorders: Secondary | ICD-10-CM | POA: Diagnosis not present

## 2022-11-28 DIAGNOSIS — M069 Rheumatoid arthritis, unspecified: Secondary | ICD-10-CM | POA: Diagnosis not present

## 2022-11-28 DIAGNOSIS — I10 Essential (primary) hypertension: Secondary | ICD-10-CM | POA: Diagnosis not present

## 2022-11-28 DIAGNOSIS — E119 Type 2 diabetes mellitus without complications: Secondary | ICD-10-CM | POA: Diagnosis not present

## 2022-11-28 DIAGNOSIS — R1010 Upper abdominal pain, unspecified: Secondary | ICD-10-CM | POA: Diagnosis not present

## 2022-12-02 ENCOUNTER — Other Ambulatory Visit: Payer: Self-pay | Admitting: Internal Medicine

## 2022-12-02 DIAGNOSIS — R1010 Upper abdominal pain, unspecified: Secondary | ICD-10-CM

## 2022-12-02 DIAGNOSIS — R110 Nausea: Secondary | ICD-10-CM

## 2022-12-03 ENCOUNTER — Ambulatory Visit
Admission: RE | Admit: 2022-12-03 | Discharge: 2022-12-03 | Disposition: A | Discharge status: Home / Self Care | Payer: Medicare HMO | Source: Ambulatory Visit | Attending: Internal Medicine | Admitting: Internal Medicine

## 2022-12-03 DIAGNOSIS — R110 Nausea: Secondary | ICD-10-CM | POA: Diagnosis not present

## 2022-12-03 DIAGNOSIS — R1010 Upper abdominal pain, unspecified: Secondary | ICD-10-CM | POA: Insufficient documentation

## 2022-12-17 ENCOUNTER — Encounter: Payer: Self-pay | Admitting: Urology

## 2022-12-17 ENCOUNTER — Ambulatory Visit: Payer: Medicare HMO | Admitting: Urology

## 2022-12-17 VITALS — BP 124/77 | HR 81 | Ht 62.0 in | Wt 172.0 lb

## 2022-12-17 DIAGNOSIS — R3129 Other microscopic hematuria: Secondary | ICD-10-CM

## 2022-12-17 DIAGNOSIS — N200 Calculus of kidney: Secondary | ICD-10-CM | POA: Diagnosis not present

## 2022-12-17 DIAGNOSIS — R319 Hematuria, unspecified: Secondary | ICD-10-CM | POA: Diagnosis not present

## 2022-12-17 LAB — URINALYSIS, COMPLETE
Bilirubin, UA: NEGATIVE
Glucose, UA: NEGATIVE
Ketones, UA: NEGATIVE
Leukocytes,UA: NEGATIVE
Nitrite, UA: NEGATIVE
Protein,UA: NEGATIVE
RBC, UA: NEGATIVE
Specific Gravity, UA: 1.015 (ref 1.005–1.030)
Urobilinogen, Ur: 1 mg/dL (ref 0.2–1.0)
pH, UA: 6.5 (ref 5.0–7.5)

## 2022-12-17 LAB — MICROSCOPIC EXAMINATION
Epithelial Cells (non renal): >10 /HPF — AB (ref 0–10)
RBC, Urine: NONE SEEN /HPF (ref 0–2)

## 2022-12-17 NOTE — Progress Notes (Signed)
   12/17/22  CC:  Chief Complaint  Patient presents with   Cysto    HPI: Refer to [facility]'s note 11/18/2022.  UA today negative RBCs on microscopy  Blood pressure 124/77, pulse 81, height 5\' 2"  (1.575 m), weight 172 lb (78 kg). NED. A&Ox3.   No respiratory distress   Abd soft, NT, ND Normal external genitalia with patent urethral meatus  Cystoscopy Procedure Note  Patient identification was confirmed, informed consent was obtained, and patient was prepped using Betadine solution.  Lidocaine jelly was administered per urethral meatus.    Procedure: - Flexible cystoscope introduced, without any difficulty.   - Thorough search of the bladder revealed:    normal urethral meatus    normal urothelium    no stones    no ulcers     no tumors    no urethral polyps    no trabeculation  - Ureteral orifices were normal in position and appearance.  Post-Procedure: - Patient tolerated the procedure well  Assessment/ Plan: No bladder mucosal abnormalities identified on cystoscopy Urine cytology sent Follow-up 75-month with Onalee Hua, MD

## 2022-12-26 DIAGNOSIS — M5136 Other intervertebral disc degeneration, lumbar region: Secondary | ICD-10-CM | POA: Diagnosis not present

## 2022-12-26 DIAGNOSIS — M9903 Segmental and somatic dysfunction of lumbar region: Secondary | ICD-10-CM | POA: Diagnosis not present

## 2022-12-26 DIAGNOSIS — M9905 Segmental and somatic dysfunction of pelvic region: Secondary | ICD-10-CM | POA: Diagnosis not present

## 2022-12-26 DIAGNOSIS — M5416 Radiculopathy, lumbar region: Secondary | ICD-10-CM | POA: Diagnosis not present

## 2022-12-27 ENCOUNTER — Telehealth: Payer: Self-pay | Admitting: Urology

## 2022-12-27 NOTE — Telephone Encounter (Signed)
Urine cytology showed no malignant or abnormal appearing cells.

## 2022-12-29 NOTE — Telephone Encounter (Signed)
Notified patient as instructed, patient pleased. Discussed follow-up appointments, patient agrees

## 2022-12-31 DIAGNOSIS — M9903 Segmental and somatic dysfunction of lumbar region: Secondary | ICD-10-CM | POA: Diagnosis not present

## 2022-12-31 DIAGNOSIS — M5136 Other intervertebral disc degeneration, lumbar region: Secondary | ICD-10-CM | POA: Diagnosis not present

## 2022-12-31 DIAGNOSIS — M9905 Segmental and somatic dysfunction of pelvic region: Secondary | ICD-10-CM | POA: Diagnosis not present

## 2022-12-31 DIAGNOSIS — M5416 Radiculopathy, lumbar region: Secondary | ICD-10-CM | POA: Diagnosis not present

## 2023-01-06 ENCOUNTER — Other Ambulatory Visit: Payer: Self-pay | Admitting: Internal Medicine

## 2023-01-06 DIAGNOSIS — Z1231 Encounter for screening mammogram for malignant neoplasm of breast: Secondary | ICD-10-CM

## 2023-01-07 DIAGNOSIS — M9905 Segmental and somatic dysfunction of pelvic region: Secondary | ICD-10-CM | POA: Diagnosis not present

## 2023-01-07 DIAGNOSIS — M5416 Radiculopathy, lumbar region: Secondary | ICD-10-CM | POA: Diagnosis not present

## 2023-01-07 DIAGNOSIS — M9903 Segmental and somatic dysfunction of lumbar region: Secondary | ICD-10-CM | POA: Diagnosis not present

## 2023-01-07 DIAGNOSIS — M5136 Other intervertebral disc degeneration, lumbar region with discogenic back pain only: Secondary | ICD-10-CM | POA: Diagnosis not present

## 2023-01-14 DIAGNOSIS — M19221 Secondary osteoarthritis, right elbow: Secondary | ICD-10-CM | POA: Diagnosis not present

## 2023-01-14 DIAGNOSIS — I10 Essential (primary) hypertension: Secondary | ICD-10-CM | POA: Diagnosis not present

## 2023-01-14 DIAGNOSIS — R8290 Unspecified abnormal findings in urine: Secondary | ICD-10-CM | POA: Diagnosis not present

## 2023-01-14 DIAGNOSIS — Z6833 Body mass index (BMI) 33.0-33.9, adult: Secondary | ICD-10-CM | POA: Diagnosis not present

## 2023-01-14 DIAGNOSIS — K219 Gastro-esophageal reflux disease without esophagitis: Secondary | ICD-10-CM | POA: Diagnosis not present

## 2023-01-14 DIAGNOSIS — N390 Urinary tract infection, site not specified: Secondary | ICD-10-CM | POA: Diagnosis not present

## 2023-01-14 DIAGNOSIS — M5416 Radiculopathy, lumbar region: Secondary | ICD-10-CM | POA: Diagnosis not present

## 2023-01-14 DIAGNOSIS — M059 Rheumatoid arthritis with rheumatoid factor, unspecified: Secondary | ICD-10-CM | POA: Diagnosis not present

## 2023-01-14 DIAGNOSIS — M5136 Other intervertebral disc degeneration, lumbar region with discogenic back pain only: Secondary | ICD-10-CM | POA: Diagnosis not present

## 2023-01-14 DIAGNOSIS — M9903 Segmental and somatic dysfunction of lumbar region: Secondary | ICD-10-CM | POA: Diagnosis not present

## 2023-01-14 DIAGNOSIS — M9905 Segmental and somatic dysfunction of pelvic region: Secondary | ICD-10-CM | POA: Diagnosis not present

## 2023-01-14 DIAGNOSIS — F419 Anxiety disorder, unspecified: Secondary | ICD-10-CM | POA: Diagnosis not present

## 2023-01-14 DIAGNOSIS — E1165 Type 2 diabetes mellitus with hyperglycemia: Secondary | ICD-10-CM | POA: Diagnosis not present

## 2023-01-14 DIAGNOSIS — B9620 Unspecified Escherichia coli [E. coli] as the cause of diseases classified elsewhere: Secondary | ICD-10-CM | POA: Diagnosis not present

## 2023-01-21 DIAGNOSIS — Z23 Encounter for immunization: Secondary | ICD-10-CM | POA: Diagnosis not present

## 2023-01-21 DIAGNOSIS — F418 Other specified anxiety disorders: Secondary | ICD-10-CM | POA: Diagnosis not present

## 2023-01-21 DIAGNOSIS — I10 Essential (primary) hypertension: Secondary | ICD-10-CM | POA: Diagnosis not present

## 2023-01-21 DIAGNOSIS — M069 Rheumatoid arthritis, unspecified: Secondary | ICD-10-CM | POA: Diagnosis not present

## 2023-01-21 DIAGNOSIS — R590 Localized enlarged lymph nodes: Secondary | ICD-10-CM | POA: Diagnosis not present

## 2023-01-21 DIAGNOSIS — E119 Type 2 diabetes mellitus without complications: Secondary | ICD-10-CM | POA: Diagnosis not present

## 2023-01-21 DIAGNOSIS — M25512 Pain in left shoulder: Secondary | ICD-10-CM | POA: Diagnosis not present

## 2023-01-21 DIAGNOSIS — I2510 Atherosclerotic heart disease of native coronary artery without angina pectoris: Secondary | ICD-10-CM | POA: Diagnosis not present

## 2023-01-23 DIAGNOSIS — R52 Pain, unspecified: Secondary | ICD-10-CM | POA: Diagnosis not present

## 2023-01-23 DIAGNOSIS — R6889 Other general symptoms and signs: Secondary | ICD-10-CM | POA: Diagnosis not present

## 2023-01-23 DIAGNOSIS — R509 Fever, unspecified: Secondary | ICD-10-CM | POA: Diagnosis not present

## 2023-01-23 DIAGNOSIS — R42 Dizziness and giddiness: Secondary | ICD-10-CM | POA: Diagnosis not present

## 2023-01-23 DIAGNOSIS — J069 Acute upper respiratory infection, unspecified: Secondary | ICD-10-CM | POA: Diagnosis not present

## 2023-01-23 DIAGNOSIS — R112 Nausea with vomiting, unspecified: Secondary | ICD-10-CM | POA: Diagnosis not present

## 2023-01-23 DIAGNOSIS — Z03818 Encounter for observation for suspected exposure to other biological agents ruled out: Secondary | ICD-10-CM | POA: Diagnosis not present

## 2023-01-27 DIAGNOSIS — M5136 Other intervertebral disc degeneration, lumbar region with discogenic back pain only: Secondary | ICD-10-CM | POA: Diagnosis not present

## 2023-01-27 DIAGNOSIS — M9903 Segmental and somatic dysfunction of lumbar region: Secondary | ICD-10-CM | POA: Diagnosis not present

## 2023-01-27 DIAGNOSIS — M9905 Segmental and somatic dysfunction of pelvic region: Secondary | ICD-10-CM | POA: Diagnosis not present

## 2023-01-27 DIAGNOSIS — M5416 Radiculopathy, lumbar region: Secondary | ICD-10-CM | POA: Diagnosis not present

## 2023-02-05 DIAGNOSIS — I890 Lymphedema, not elsewhere classified: Secondary | ICD-10-CM | POA: Diagnosis not present

## 2023-02-13 DIAGNOSIS — M19011 Primary osteoarthritis, right shoulder: Secondary | ICD-10-CM | POA: Diagnosis not present

## 2023-02-13 DIAGNOSIS — M79632 Pain in left forearm: Secondary | ICD-10-CM | POA: Diagnosis not present

## 2023-02-13 DIAGNOSIS — M542 Cervicalgia: Secondary | ICD-10-CM | POA: Diagnosis not present

## 2023-02-13 DIAGNOSIS — M19012 Primary osteoarthritis, left shoulder: Secondary | ICD-10-CM | POA: Diagnosis not present

## 2023-02-13 DIAGNOSIS — G8929 Other chronic pain: Secondary | ICD-10-CM | POA: Diagnosis not present

## 2023-02-16 ENCOUNTER — Ambulatory Visit
Admission: RE | Admit: 2023-02-16 | Discharge: 2023-02-16 | Disposition: A | Discharge status: Home / Self Care | Payer: Medicare HMO | Source: Ambulatory Visit | Attending: Internal Medicine | Admitting: Internal Medicine

## 2023-02-16 DIAGNOSIS — Z1231 Encounter for screening mammogram for malignant neoplasm of breast: Secondary | ICD-10-CM | POA: Diagnosis not present

## 2023-02-25 DIAGNOSIS — M5136 Other intervertebral disc degeneration, lumbar region with discogenic back pain only: Secondary | ICD-10-CM | POA: Diagnosis not present

## 2023-02-25 DIAGNOSIS — M9903 Segmental and somatic dysfunction of lumbar region: Secondary | ICD-10-CM | POA: Diagnosis not present

## 2023-02-25 DIAGNOSIS — M5416 Radiculopathy, lumbar region: Secondary | ICD-10-CM | POA: Diagnosis not present

## 2023-02-25 DIAGNOSIS — M9905 Segmental and somatic dysfunction of pelvic region: Secondary | ICD-10-CM | POA: Diagnosis not present

## 2023-02-27 ENCOUNTER — Other Ambulatory Visit
Admission: RE | Admit: 2023-02-27 | Discharge: 2023-02-27 | Disposition: A | Discharge status: Home / Self Care | Payer: Medicare HMO | Source: Ambulatory Visit | Attending: Nurse Practitioner | Admitting: Nurse Practitioner

## 2023-02-27 DIAGNOSIS — R0789 Other chest pain: Secondary | ICD-10-CM | POA: Diagnosis not present

## 2023-02-27 DIAGNOSIS — R0609 Other forms of dyspnea: Secondary | ICD-10-CM | POA: Insufficient documentation

## 2023-02-27 DIAGNOSIS — E1165 Type 2 diabetes mellitus with hyperglycemia: Secondary | ICD-10-CM | POA: Diagnosis not present

## 2023-02-27 DIAGNOSIS — R531 Weakness: Secondary | ICD-10-CM | POA: Diagnosis not present

## 2023-02-27 DIAGNOSIS — I25119 Atherosclerotic heart disease of native coronary artery with unspecified angina pectoris: Secondary | ICD-10-CM | POA: Diagnosis not present

## 2023-02-27 DIAGNOSIS — E7849 Other hyperlipidemia: Secondary | ICD-10-CM | POA: Diagnosis not present

## 2023-02-27 DIAGNOSIS — R110 Nausea: Secondary | ICD-10-CM | POA: Diagnosis not present

## 2023-02-27 DIAGNOSIS — R42 Dizziness and giddiness: Secondary | ICD-10-CM | POA: Diagnosis not present

## 2023-02-27 LAB — TROPONIN I (HIGH SENSITIVITY): Troponin I (High Sensitivity): 20 ng/L — ABNORMAL HIGH (ref <18)

## 2023-02-27 LAB — BRAIN NATRIURETIC PEPTIDE: B Natriuretic Peptide: 65.7 pg/mL (ref 0.0–100.0)

## 2023-03-04 DIAGNOSIS — I25119 Atherosclerotic heart disease of native coronary artery with unspecified angina pectoris: Secondary | ICD-10-CM | POA: Diagnosis not present

## 2023-03-04 DIAGNOSIS — R0602 Shortness of breath: Secondary | ICD-10-CM | POA: Diagnosis not present

## 2023-03-09 DIAGNOSIS — M5416 Radiculopathy, lumbar region: Secondary | ICD-10-CM | POA: Diagnosis not present

## 2023-03-09 DIAGNOSIS — M9903 Segmental and somatic dysfunction of lumbar region: Secondary | ICD-10-CM | POA: Diagnosis not present

## 2023-03-09 DIAGNOSIS — M5136 Other intervertebral disc degeneration, lumbar region with discogenic back pain only: Secondary | ICD-10-CM | POA: Diagnosis not present

## 2023-03-09 DIAGNOSIS — M9905 Segmental and somatic dysfunction of pelvic region: Secondary | ICD-10-CM | POA: Diagnosis not present

## 2023-03-10 DIAGNOSIS — Z7952 Long term (current) use of systemic steroids: Secondary | ICD-10-CM | POA: Diagnosis not present

## 2023-03-10 DIAGNOSIS — M0579 Rheumatoid arthritis with rheumatoid factor of multiple sites without organ or systems involvement: Secondary | ICD-10-CM | POA: Diagnosis not present

## 2023-03-16 NOTE — Progress Notes (Unsigned)
03/18/23 2:28 PM   Elaine Cox [birth year] [id number]  Referring provider:  Barbette Reichmann, MD [address] [facility] [place],  [place] [address]  Urological history  1. High risk hematuria - non-smoker - Non-contrast CT 08/2020 - non-obstructing tiny right renal calculi - cysto 08/2019 - NED - CTU (11/2022) - tiny right renal stones - cysto (12/2022) - NED - Urine cytology (12/2022) - negative    2. Nephrolithiasis  - CT renal stone study 08/2020 - three right renal stone ~ 4 mm in size - KUB on 06/21/2021 visualized Stable multiple tiny right renal calculi. Stable vascular calcification overlying the upper pole of the left kidney or upper pole left renal staghorn type calculus.  - CT renal stone study on 07/03/2021 visualized multiple small nonobstructive right renal calculi. No left-sided calculi, ureteral calculi, or hydronephrosis - CTU (11/2022) - tiny right renal stones   3. Vaginal atrophy  - Managed on OTC vaginal lubricants   4. rUTI's -Contributing factors of age, vaginal atrophy, diabetes and constipation -Documented positive urine cultures over the last year  October 15, 2022 mixed urogenital flora  Sep 04, 2022 E. Coli  June 26, 2022 no growth  March 28, 2022 mixed urogenital flora     Chief Complaint  Patient presents with   Other    HPI: Elaine Cox is a 77 y.o.female who presents today for three month follow up.    Previous records reviewed.  She has recently been evaluated by cardiology for dyspnea on exertion.  She has a cardiac cath on the 18th.    Patient recently underwent a hematuria workup which noted tiny right renal stones.  She does have some urge incontinence on occasion, but nothing bothersome.  Patient denies any modifying or aggravating factors.  Patient denies any recent UTI's, gross hematuria, dysuria or suprapubic/flank pain.  Patient denies any fevers, chills, nausea or vomiting.    PMH: Past  Medical History:  Diagnosis Date   Anginal pain (HCC) 06/2016   being followed by dr. Lady Gary   Anxiety    Arthritis    Osteoarthritis   Arthritis    Rheumatoid   Breast cancer (HCC) 2000   Right Breast - Chemotherapy   Collagen vascular disease (HCC)    Rheumatoid Arthritis.   Coronary artery disease    Diabetes mellitus without complication [facility])    Patient takes Metformin   Dyspnea 03/2017   GERD (gastroesophageal reflux disease)    History of abdominal hysterectomy    History of eyelid surgery    History of kidney stones    Hyperlipidemia    Hypertension    Lumbago    Murmur    Myocardial infarction (HCC) 07/2010   Orthopnea 03/2017   Personal history of chemotherapy 2000   right breast ca   Sinus disorder    Stroke [facility]) 2006, 2012   Wears dentures    full upper and lower    Surgical History: Past Surgical History:  Procedure Laterality Date   ABDOMINAL HYSTERECTOMY     BREAST SURGERY Right    mastectomy    CARDIAC CATHETERIZATION  2012   stent placed   CATARACT EXTRACTION W/PHACO Right 06/24/2017   Procedure: CATARACT EXTRACTION PHACO AND INTRAOCULAR LENS PLACEMENT (IOC) COMPLICATED RIGHT DIABETIC;  Surgeon: Lockie Mola, MD;  Location: [facility];  Service: Ophthalmology;  Laterality: Right;  Diabetes - oral med   COLONOSCOPY     COLONOSCOPY WITH PROPOFOL N/A 12/08/2017   Procedure:  COLONOSCOPY WITH PROPOFOL;  Surgeon: Toledo, Boykin Nearing, MD;  Location: [facility] ENDOSCOPY;  Service: Gastroenterology;  Laterality: N/A;   CORONARY ANGIOPLASTY     CORONARY STENT INTERVENTION N/A 02/24/2018   Procedure: CORONARY STENT INTERVENTION;  Surgeon: Marcina Millard, MD;  Location: [facility] INVASIVE CV LAB;  Service: Cardiovascular;  Laterality: N/A;   CYSTOSCOPY N/A 09/08/2016   Procedure: CYSTOSCOPY;  Surgeon: Ward, Elenora Fender, MD;  Location: [facility] ORS;  Service: Gynecology;  Laterality: N/A;   EYE SURGERY Left    Cataract Extraction with IOL   KNEE ARTHROPLASTY  Right 03/14/2020   Procedure: COMPUTER ASSISTED TOTAL KNEE ARTHROPLASTY;  Surgeon: Donato Heinz, MD;  Location: [facility] ORS;  Service: Orthopedics;  Laterality: Right;  FOLLOWING 1ST CASE   KNEE ARTHROSCOPY W/ MENISCAL REPAIR Right    LAPAROSCOPIC BILATERAL SALPINGO OOPHERECTOMY Bilateral 09/08/2016   Procedure: LAPAROSCOPIC BILATERAL SALPINGO OOPHORECTOMY;  Surgeon: Ward, Elenora Fender, MD;  Location: [facility] ORS;  Service: Gynecology;  Laterality: Bilateral;   LEFT HEART CATH AND CORONARY ANGIOGRAPHY Left 02/24/2018   Procedure: LEFT HEART CATH AND CORONARY ANGIOGRAPHY;  Surgeon: Marcina Millard, MD;  Location: [facility] INVASIVE CV LAB;  Service: Cardiovascular;  Laterality: Left;   MASTECTOMY Right 2000   BREAST CA    Home Medications:  Allergies as of 03/18/2023       Reactions   Doxazosin Other (See Comments)   "headache"   Fish Allergy Anaphylaxis, Itching, Nausea Only, Swelling   She states her tongue turns black.   Dicloxacillin Nausea And Vomiting   Has patient had a PCN reaction causing immediate rash, facial/tongue/throat swelling, SOB or lightheadedness with hypotension: No Has patient had a PCN reaction causing severe rash involving mucus membranes or skin necrosis: No Has patient had a PCN reaction that required hospitalization: Unknown Has patient had a PCN reaction occurring within the last 10 years: Unknown If all of the above answers are "NO", then may proceed with Cephalosporin use.   Iodine Hives   Per patient, she had a previous reaction/hives from an iodine injection   Sulfa Antibiotics Swelling   Loss of appetite   Constipation bloating Other reaction(s): Headache   Beta Adrenergic Blockers Other (See Comments)   "Unknown"   Cardura [doxazosin Mesylate]    Headache    Etodolac Itching, Swelling   Sulfamethoxazole-trimethoprim Nausea Only   Azithromycin Itching   Ciprofloxacin Nausea Only, Rash   Clopidogrel Nausea And Vomiting   Colchicine Nausea Only, Other (See  Comments)   Dizzness   Glucosamine Nausea And Vomiting, Other (See Comments)   Leflunomide Diarrhea, Itching   Meloxicam Itching   Methotrexate Nausea Only   Zoloft [sertraline] Nausea And Vomiting        Medication List        Accurate as of March 18, 2023  2:28 PM. If you have any questions, ask your nurse or doctor.          Accu-Chek Guide test strip Generic drug: glucose blood   acetaminophen 325 MG tablet Commonly known as: TYLENOL Take 2 tablets (650 mg total) by mouth every 6 (six) hours as needed for mild pain (or Fever >/= 101).   acidophilus Caps capsule Take by mouth in the morning and at bedtime.   ALPRAZolam 0.25 MG tablet Commonly known as: XANAX Take 0.25 mg by mouth at bedtime.   amLODipine 5 MG tablet Commonly known as: NORVASC Take 5 mg by mouth daily.   aspirin EC 81 MG tablet Take 1 tablet (81 mg total) by  mouth daily. Swallow whole.   B-D SINGLE USE SWABS REGULAR Pads   Biotin 800 MCG Tabs Take 800 mcg by mouth daily.   diphenhydrAMINE 50 MG tablet Commonly known as: BENADRYL Take one hour prior to CT scan   glimepiride 4 MG tablet Commonly known as: AMARYL Take 4 mg by mouth daily.   iron polysaccharides 150 MG capsule Commonly known as: NIFEREX Take 150 mg by mouth daily.   metoprolol succinate 100 MG 24 hr tablet Commonly known as: TOPROL-XL Take 100 mg by mouth daily. Pt taking 50mg    MURINE TEARS FOR DRY EYES OP Apply 1 drop to eye daily as needed (dry eyes).   Muscle Rub 10-15 % Crea Apply 1 application topically as needed for muscle pain.   nitroGLYCERIN 0.4 MG SL tablet Commonly known as: NITROSTAT Place 0.4 mg under the tongue daily as needed for chest pain.   omeprazole 20 MG capsule Commonly known as: PRILOSEC Take 20 mg by mouth daily.   Potassium Chloride ER 20 MEQ Tbcr Take 20 mEq by mouth daily.   rosuvastatin 5 MG tablet Commonly known as: CRESTOR Take 5 mg by mouth every Monday, Wednesday, and  Friday.   True Metrix Level 1 Low Soln        Allergies:  Allergies  Allergen Reactions   Doxazosin Other (See Comments)    "headache"   Fish Allergy Anaphylaxis, Itching, Nausea Only and Swelling    She states her tongue turns black.    Dicloxacillin Nausea And Vomiting    Has patient had a PCN reaction causing immediate rash, facial/tongue/throat swelling, SOB or lightheadedness with hypotension: No Has patient had a PCN reaction causing severe rash involving mucus membranes or skin necrosis: No Has patient had a PCN reaction that required hospitalization: Unknown Has patient had a PCN reaction occurring within the last 10 years: Unknown If all of the above answers are "NO", then may proceed with Cephalosporin use.   Iodine Hives    Per patient, she had a previous reaction/hives from an iodine injection   Sulfa Antibiotics Swelling    Loss of appetite   Constipation bloating Other reaction(s): Headache    Beta Adrenergic Blockers Other (See Comments)    "Unknown"   Cardura [Doxazosin Mesylate]     Headache    Etodolac Itching and Swelling   Sulfamethoxazole-Trimethoprim Nausea Only   Azithromycin Itching   Ciprofloxacin Nausea Only and Rash   Clopidogrel Nausea And Vomiting   Colchicine Nausea Only and Other (See Comments)    Dizzness   Glucosamine Nausea And Vomiting and Other (See Comments)   Leflunomide Diarrhea and Itching   Meloxicam Itching   Methotrexate Nausea Only   Zoloft [Sertraline] Nausea And Vomiting    Family History: Family History  Problem Relation Age of Onset   Diabetes type II Mother    Hypertension Mother    Colon polyps Mother    Breast cancer Maternal Aunt     Social History:  reports that she has never smoked. She has never used smokeless tobacco. She reports that she does not drink alcohol and does not use drugs.   Physical Exam: BP 129/76   Pulse 69   Ht 5\' 2"  (1.575 m)   Wt 172 lb (78 kg)   LMP  (LMP Unknown)   BMI 31.46  kg/m   Constitutional:  Well nourished. Alert and oriented, No acute distress. HEENT: [place] AT, moist mucus membranes.  Trachea midline Cardiovascular: No clubbing, cyanosis, or edema. Respiratory:  Normal respiratory effort, no increased work of breathing. Neurologic: Grossly intact, no focal deficits, moving all 4 extremities. Psychiatric: Normal mood and affect.    Laboratory Data: tains abnormal data Basic Metabolic Panel (BMP) Order: [id number] Component Ref Range & Units 1 mo ago  Glucose 70 - 110 mg/dL 409 High   Sodium 811 - 145 mmol/L 135 Low   Potassium 3.6 - 5.1 mmol/L 4.4  Chloride 97 - 109 mmol/L 97  Carbon Dioxide (CO2) 22.0 - 32.0 mmol/L 29.1  Calcium 8.7 - 10.3 mg/dL 9.4  Urea Nitrogen (BUN) 7 - 25 mg/dL 6 Low   Creatinine 0.6 - 1.1 mg/dL 0.7  Glomerular Filtration Rate (eGFR) >60 mL/min/1.73sq m 90  Comment: CKD-EPI (2021) does not include patient's race in the calculation of eGFR.  Monitoring changes of plasma creatinine and eGFR over time is useful for monitoring kidney function.  Interpretive Ranges for eGFR (CKD-EPI 2021):  eGFR:       >60 mL/min/1.73 sq. m - Normal eGFR:       30-59 mL/min/1.73 sq. m - Moderately Decreased eGFR:       15-29 mL/min/1.73 sq. m  - Severely Decreased eGFR:       < 15 mL/min/1.73 sq. m  - Kidney Failure   Note: These eGFR calculations do not apply in acute situations when eGFR is changing rapidly or patients on dialysis.  BUN/Crea Ratio 6.0 - 20.0 8.6  Anion Gap w/K 6.0 - 16.0 13.3  Resulting Agency [facility]   Specimen Collected: 01/23/23 08:45   Performed by: [facility] Last Resulted: 01/23/23 09:36  Received From: [facility]  Result Received: 02/16/23 10:06   CBC w/auto Differential (5 Part) Order: [id number] Component Ref Range & Units 1 mo ago  WBC (White Blood Cell Count) 4.1 - 10.2 10^3/uL 6.3  RBC (Red Blood Cell Count) 4.04 - 5.48 10^6/uL 4.77   Hemoglobin 12.0 - 15.0 gm/dL 21.3  Hematocrit 08.6 - 47.0 % 39.2  MCV (Mean Corpuscular Volume) 80.0 - 100.0 fl 82.2  MCH (Mean Corpuscular Hemoglobin) 27.0 - 31.2 pg 29.8  MCHC (Mean Corpuscular Hemoglobin Concentration) 32.0 - 36.0 gm/dL 57.8 High   Platelet Count 150 - 450 10^3/uL 210  RDW-CV (Red Cell Distribution Width) 11.6 - 14.8 % 13.7  MPV (Mean Platelet Volume) 9.4 - 12.4 fl 9.9  Neutrophils 1.50 - 7.80 10^3/uL 4.87  Lymphocytes 1.00 - 3.60 10^3/uL 0.83 Low   Monocytes 0.00 - 1.50 10^3/uL 0.47  Eosinophils 0.00 - 0.55 10^3/uL 0.13  Basophils 0.00 - 0.09 10^3/uL 0.01  Neutrophil % 32.0 - 70.0 % 77.1 High   Lymphocyte % 10.0 - 50.0 % 13.2  Monocyte % 4.0 - 13.0 % 7.4  Eosinophil % 1.0 - 5.0 % 2.1  Basophil% 0.0 - 2.0 % 0.2  Immature Granulocyte % <=0.7 % 0  Immature Granulocyte Count <=0.06 10^3/L 0  Resulting Agency [facility]   Specimen Collected: 01/23/23 08:45   Performed by: [facility] Last Resulted: 01/23/23 09:26  Received From: [facility]  Result Received: 02/16/23 10:06  I have reviewed the labs.  See HPI.      Pertinent Imaging  N/A  Assessment & Plan:    Right renal stones - CT urogram with tiny right renal stones, no hydro -asymptomatic  2. rUTI's - continue OTC preventative strategies    3. High risk hematuria - work up in May 2021 - bilateral  nephrolithiasis - work up in Oct 2024 - tiny right renal stones  4. Urge incontinence -Mild and not bothersome  Return in about 1 year (around 03/17/2024) for KUB, OAB, PVR .  [facility] [address] [place], [place] [address] [phone]

## 2023-03-18 ENCOUNTER — Encounter: Payer: Self-pay | Admitting: Urology

## 2023-03-18 ENCOUNTER — Ambulatory Visit: Payer: Medicare HMO | Admitting: Urology

## 2023-03-18 VITALS — BP 129/76 | HR 69 | Ht 62.0 in | Wt 172.0 lb

## 2023-03-18 DIAGNOSIS — N3941 Urge incontinence: Secondary | ICD-10-CM | POA: Diagnosis not present

## 2023-03-18 DIAGNOSIS — N390 Urinary tract infection, site not specified: Secondary | ICD-10-CM

## 2023-03-18 DIAGNOSIS — I2510 Atherosclerotic heart disease of native coronary artery without angina pectoris: Secondary | ICD-10-CM | POA: Diagnosis not present

## 2023-03-18 DIAGNOSIS — N200 Calculus of kidney: Secondary | ICD-10-CM | POA: Diagnosis not present

## 2023-03-18 DIAGNOSIS — I25118 Atherosclerotic heart disease of native coronary artery with other forms of angina pectoris: Secondary | ICD-10-CM | POA: Diagnosis not present

## 2023-03-18 DIAGNOSIS — Z87898 Personal history of other specified conditions: Secondary | ICD-10-CM | POA: Diagnosis not present

## 2023-03-18 DIAGNOSIS — Z01810 Encounter for preprocedural cardiovascular examination: Secondary | ICD-10-CM | POA: Diagnosis not present

## 2023-03-18 DIAGNOSIS — I10 Essential (primary) hypertension: Secondary | ICD-10-CM | POA: Diagnosis not present

## 2023-03-18 DIAGNOSIS — Z87440 Personal history of urinary (tract) infections: Secondary | ICD-10-CM

## 2023-03-18 DIAGNOSIS — E1165 Type 2 diabetes mellitus with hyperglycemia: Secondary | ICD-10-CM | POA: Diagnosis not present

## 2023-03-18 DIAGNOSIS — E7849 Other hyperlipidemia: Secondary | ICD-10-CM | POA: Diagnosis not present

## 2023-03-18 DIAGNOSIS — R319 Hematuria, unspecified: Secondary | ICD-10-CM

## 2023-03-18 DIAGNOSIS — R0609 Other forms of dyspnea: Secondary | ICD-10-CM | POA: Diagnosis not present

## 2023-03-18 NOTE — Addendum Note (Signed)
Addended by: Winn Jock on: 03/18/2023 02:33 PM   Modules accepted: Orders

## 2023-03-23 DIAGNOSIS — M5416 Radiculopathy, lumbar region: Secondary | ICD-10-CM | POA: Diagnosis not present

## 2023-03-23 DIAGNOSIS — M9903 Segmental and somatic dysfunction of lumbar region: Secondary | ICD-10-CM | POA: Diagnosis not present

## 2023-03-23 DIAGNOSIS — M5136 Other intervertebral disc degeneration, lumbar region with discogenic back pain only: Secondary | ICD-10-CM | POA: Diagnosis not present

## 2023-03-23 DIAGNOSIS — M9905 Segmental and somatic dysfunction of pelvic region: Secondary | ICD-10-CM | POA: Diagnosis not present

## 2023-03-25 ENCOUNTER — Observation Stay
Admission: RE | Admit: 2023-03-25 | Discharge: 2023-03-26 | Disposition: A | Discharge status: Home / Self Care | Payer: Medicare HMO | Attending: Cardiology | Admitting: Cardiology

## 2023-03-25 ENCOUNTER — Encounter
Admission: RE | Disposition: A | Discharge status: Home / Self Care | Payer: Self-pay | Source: Home / Self Care | Attending: Cardiology

## 2023-03-25 ENCOUNTER — Other Ambulatory Visit: Payer: Self-pay

## 2023-03-25 ENCOUNTER — Encounter: Payer: Self-pay | Admitting: Cardiology

## 2023-03-25 DIAGNOSIS — Z96651 Presence of right artificial knee joint: Secondary | ICD-10-CM | POA: Diagnosis not present

## 2023-03-25 DIAGNOSIS — Z79899 Other long term (current) drug therapy: Secondary | ICD-10-CM | POA: Diagnosis not present

## 2023-03-25 DIAGNOSIS — I10 Essential (primary) hypertension: Secondary | ICD-10-CM | POA: Insufficient documentation

## 2023-03-25 DIAGNOSIS — I25119 Atherosclerotic heart disease of native coronary artery with unspecified angina pectoris: Secondary | ICD-10-CM | POA: Diagnosis not present

## 2023-03-25 DIAGNOSIS — I2510 Atherosclerotic heart disease of native coronary artery without angina pectoris: Principal | ICD-10-CM | POA: Diagnosis present

## 2023-03-25 DIAGNOSIS — Z853 Personal history of malignant neoplasm of breast: Secondary | ICD-10-CM | POA: Diagnosis not present

## 2023-03-25 DIAGNOSIS — R079 Chest pain, unspecified: Secondary | ICD-10-CM

## 2023-03-25 DIAGNOSIS — Z8673 Personal history of transient ischemic attack (TIA), and cerebral infarction without residual deficits: Secondary | ICD-10-CM | POA: Diagnosis not present

## 2023-03-25 DIAGNOSIS — I25118 Atherosclerotic heart disease of native coronary artery with other forms of angina pectoris: Secondary | ICD-10-CM | POA: Diagnosis not present

## 2023-03-25 DIAGNOSIS — Z7982 Long term (current) use of aspirin: Secondary | ICD-10-CM | POA: Insufficient documentation

## 2023-03-25 DIAGNOSIS — E1165 Type 2 diabetes mellitus with hyperglycemia: Secondary | ICD-10-CM | POA: Insufficient documentation

## 2023-03-25 HISTORY — PX: CORONARY STENT INTERVENTION: CATH118234

## 2023-03-25 HISTORY — PX: LEFT HEART CATH AND CORONARY ANGIOGRAPHY: CATH118249

## 2023-03-25 LAB — GLUCOSE, CAPILLARY
Glucose-Capillary: 122 mg/dL — ABNORMAL HIGH (ref 70–99)
Glucose-Capillary: 206 mg/dL — ABNORMAL HIGH (ref 70–99)

## 2023-03-25 LAB — POCT ACTIVATED CLOTTING TIME: Activated Clotting Time: 849 s

## 2023-03-25 SURGERY — LEFT HEART CATH AND CORONARY ANGIOGRAPHY
Anesthesia: Moderate Sedation

## 2023-03-25 MED ORDER — SODIUM CHLORIDE 0.9 % IV SOLN: 250.0000 mL | INTRAVENOUS | Status: DC | PRN

## 2023-03-25 MED ORDER — HEPARIN (PORCINE) IN NACL 2000-0.9 UNIT/L-% IV SOLN
INTRAVENOUS | Status: DC | PRN
  Administered 2023-03-25: 1000 mL

## 2023-03-25 MED ORDER — ROSUVASTATIN CALCIUM 5 MG PO TABS
5.0000 mg | ORAL_TABLET | Freq: Every day | ORAL | Status: DC
  Administered 2023-03-26: 5 mg via ORAL
  Filled 2023-03-25: qty 1

## 2023-03-25 MED ORDER — ALPRAZOLAM 0.25 MG PO TABS
0.2500 mg | ORAL_TABLET | Freq: Every day | ORAL | Status: DC
  Administered 2023-03-25: 0.25 mg via ORAL
  Filled 2023-03-25: qty 1

## 2023-03-25 MED ORDER — SODIUM CHLORIDE 0.9 % WEIGHT BASED INFUSION
3.0000 mL/kg/h | INTRAVENOUS | Status: AC
  Administered 2023-03-25: 3 mL/kg/h via INTRAVENOUS

## 2023-03-25 MED ORDER — ALUM & MAG HYDROXIDE-SIMETH 200-200-20 MG/5ML PO SUSP
ORAL | Status: AC
  Filled 2023-03-25: qty 30

## 2023-03-25 MED ORDER — GLIMEPIRIDE 4 MG PO TABS
4.0000 mg | ORAL_TABLET | Freq: Every day | ORAL | Status: DC
  Administered 2023-03-26: 4 mg via ORAL
  Filled 2023-03-25: qty 1

## 2023-03-25 MED ORDER — HYDRALAZINE HCL 20 MG/ML IJ SOLN: 10.0000 mg | INTRAMUSCULAR | Status: AC | PRN

## 2023-03-25 MED ORDER — METOPROLOL SUCCINATE ER 50 MG PO TB24
50.0000 mg | ORAL_TABLET | Freq: Every day | ORAL | Status: DC
  Administered 2023-03-26: 50 mg via ORAL
  Filled 2023-03-25: qty 1

## 2023-03-25 MED ORDER — METHYLPREDNISOLONE SODIUM SUCC 125 MG IJ SOLR
INTRAMUSCULAR | Status: AC
  Administered 2023-03-25: 125 mg via INTRAVENOUS
  Filled 2023-03-25: qty 2

## 2023-03-25 MED ORDER — BIVALIRUDIN TRIFLUOROACETATE 250 MG IV SOLR
INTRAVENOUS | Status: AC
Start: 2023-03-25 — End: ?
  Filled 2023-03-25: qty 250

## 2023-03-25 MED ORDER — ASPIRIN 81 MG PO CHEW
CHEWABLE_TABLET | ORAL | Status: AC
  Filled 2023-03-25: qty 3

## 2023-03-25 MED ORDER — CLOPIDOGREL BISULFATE 75 MG PO TABS
ORAL_TABLET | ORAL | Status: AC
  Filled 2023-03-25: qty 8

## 2023-03-25 MED ORDER — SODIUM CHLORIDE 0.9% FLUSH: 3.0000 mL | INTRAVENOUS | Status: DC | PRN

## 2023-03-25 MED ORDER — BIVALIRUDIN BOLUS VIA INFUSION - CUPID
INTRAVENOUS | Status: DC | PRN
  Administered 2023-03-25: 58.8 mg via INTRAVENOUS

## 2023-03-25 MED ORDER — AMLODIPINE BESYLATE 10 MG PO TABS
10.0000 mg | ORAL_TABLET | Freq: Every day | ORAL | Status: DC
  Administered 2023-03-25 – 2023-03-26 (×2): 10 mg via ORAL
  Filled 2023-03-25 (×2): qty 1

## 2023-03-25 MED ORDER — SODIUM CHLORIDE 0.9 % WEIGHT BASED INFUSION
1.0000 mL/kg/h | INTRAVENOUS | Status: AC
  Administered 2023-03-25: 1 mL/kg/h via INTRAVENOUS

## 2023-03-25 MED ORDER — NITROGLYCERIN 0.4 MG SL SUBL: 0.4000 mg | SUBLINGUAL_TABLET | Freq: Every day | SUBLINGUAL | Status: DC | PRN

## 2023-03-25 MED ORDER — SODIUM CHLORIDE 0.9 % WEIGHT BASED INFUSION: 1.0000 mL/kg/h | INTRAVENOUS | Status: DC

## 2023-03-25 MED ORDER — CLOPIDOGREL BISULFATE 75 MG PO TABS
ORAL_TABLET | ORAL | Status: DC | PRN
  Administered 2023-03-25: 600 mg via ORAL

## 2023-03-25 MED ORDER — SODIUM CHLORIDE 0.9% FLUSH
3.0000 mL | Freq: Two times a day (BID) | INTRAVENOUS | Status: DC
  Administered 2023-03-25: 3 mL via INTRAVENOUS

## 2023-03-25 MED ORDER — ISOSORBIDE MONONITRATE ER 60 MG PO TB24
60.0000 mg | ORAL_TABLET | Freq: Every day | ORAL | Status: DC
  Administered 2023-03-25 – 2023-03-26 (×2): 60 mg via ORAL
  Filled 2023-03-25 (×2): qty 1

## 2023-03-25 MED ORDER — METHYLPREDNISOLONE SODIUM SUCC 125 MG IJ SOLR: 125.0000 mg | Freq: Once | INTRAMUSCULAR | Status: AC

## 2023-03-25 MED ORDER — DIPHENHYDRAMINE HCL 50 MG/ML IJ SOLN
INTRAMUSCULAR | Status: AC
  Filled 2023-03-25: qty 1

## 2023-03-25 MED ORDER — HEPARIN (PORCINE) IN NACL 1000-0.9 UT/500ML-% IV SOLN
INTRAVENOUS | Status: AC
  Filled 2023-03-25: qty 1000

## 2023-03-25 MED ORDER — FENTANYL CITRATE (PF) 100 MCG/2ML IJ SOLN
INTRAMUSCULAR | Status: AC
  Filled 2023-03-25: qty 2

## 2023-03-25 MED ORDER — SODIUM CHLORIDE 0.9% FLUSH: 3.0000 mL | Freq: Two times a day (BID) | INTRAVENOUS | Status: DC

## 2023-03-25 MED ORDER — ASPIRIN 81 MG PO CHEW
CHEWABLE_TABLET | ORAL | Status: AC
  Filled 2023-03-25: qty 1

## 2023-03-25 MED ORDER — BIVALIRUDIN TRIFLUOROACETATE 250 MG IV SOLR
INTRAVENOUS | Status: AC
  Filled 2023-03-25: qty 250

## 2023-03-25 MED ORDER — SODIUM CHLORIDE 0.9 % IV SOLN
INTRAVENOUS | Status: DC | PRN
  Administered 2023-03-25 (×2): 1.75 mg/kg/h via INTRAVENOUS

## 2023-03-25 MED ORDER — MIDAZOLAM HCL 2 MG/2ML IJ SOLN
INTRAMUSCULAR | Status: AC
  Filled 2023-03-25: qty 2

## 2023-03-25 MED ORDER — LABETALOL HCL 5 MG/ML IV SOLN: 10.0000 mg | INTRAVENOUS | Status: AC | PRN

## 2023-03-25 MED ORDER — LIDOCAINE HCL (PF) 1 % IJ SOLN
INTRAMUSCULAR | Status: DC | PRN
  Administered 2023-03-25: 30 mL

## 2023-03-25 MED ORDER — ASPIRIN 81 MG PO CHEW
81.0000 mg | CHEWABLE_TABLET | Freq: Every day | ORAL | Status: DC
  Administered 2023-03-26: 81 mg via ORAL
  Filled 2023-03-25: qty 1

## 2023-03-25 MED ORDER — ACETAMINOPHEN 325 MG PO TABS: 650.0000 mg | ORAL_TABLET | Freq: Four times a day (QID) | ORAL | Status: DC | PRN

## 2023-03-25 MED ORDER — ACETAMINOPHEN 325 MG PO TABS: 650.0000 mg | ORAL_TABLET | ORAL | Status: DC | PRN

## 2023-03-25 MED ORDER — PREDNISONE 5 MG PO TABS
5.0000 mg | ORAL_TABLET | Freq: Every day | ORAL | Status: DC
  Administered 2023-03-26: 5 mg via ORAL
  Filled 2023-03-25: qty 1

## 2023-03-25 MED ORDER — IOHEXOL 300 MG/ML  SOLN
INTRAMUSCULAR | Status: DC | PRN
  Administered 2023-03-25: 248 mL

## 2023-03-25 MED ORDER — PANTOPRAZOLE SODIUM 40 MG PO TBEC
40.0000 mg | DELAYED_RELEASE_TABLET | Freq: Every day | ORAL | Status: DC
Start: 2023-03-25 — End: 2023-03-26
  Administered 2023-03-25 – 2023-03-26 (×2): 40 mg via ORAL
  Filled 2023-03-25 (×2): qty 1

## 2023-03-25 MED ORDER — DIPHENHYDRAMINE HCL 50 MG/ML IJ SOLN
50.0000 mg | Freq: Once | INTRAMUSCULAR | Status: AC
  Administered 2023-03-25: 50 mg via INTRAVENOUS

## 2023-03-25 MED ORDER — ASPIRIN 81 MG PO CHEW
81.0000 mg | CHEWABLE_TABLET | ORAL | Status: AC
  Administered 2023-03-25: 81 mg via ORAL

## 2023-03-25 MED ORDER — ONDANSETRON HCL 4 MG/2ML IJ SOLN: 4.0000 mg | Freq: Four times a day (QID) | INTRAMUSCULAR | Status: DC | PRN

## 2023-03-25 MED ORDER — NITROGLYCERIN 1 MG/10 ML FOR IR/CATH LAB
INTRA_ARTERIAL | Status: DC | PRN
  Administered 2023-03-25: 200 ug

## 2023-03-25 MED ORDER — CLOPIDOGREL BISULFATE 75 MG PO TABS
75.0000 mg | ORAL_TABLET | Freq: Every day | ORAL | Status: DC
  Administered 2023-03-26: 75 mg via ORAL
  Filled 2023-03-25: qty 1

## 2023-03-25 MED ORDER — FENTANYL CITRATE (PF) 100 MCG/2ML IJ SOLN
INTRAMUSCULAR | Status: DC | PRN
  Administered 2023-03-25 (×2): 12.5 ug via INTRAVENOUS

## 2023-03-25 MED ORDER — ALUM & MAG HYDROXIDE-SIMETH 200-200-20 MG/5ML PO SUSP
ORAL | Status: DC | PRN
  Administered 2023-03-25: 30 mL via ORAL

## 2023-03-25 MED ORDER — ASPIRIN 81 MG PO CHEW
CHEWABLE_TABLET | ORAL | Status: DC | PRN
  Administered 2023-03-25: 243 mg via ORAL

## 2023-03-25 MED ORDER — MIDAZOLAM HCL 2 MG/2ML IJ SOLN
INTRAMUSCULAR | Status: DC | PRN
  Administered 2023-03-25 (×2): .5 mg via INTRAVENOUS

## 2023-03-25 MED ORDER — POTASSIUM CHLORIDE CRYS ER 20 MEQ PO TBCR
20.0000 meq | EXTENDED_RELEASE_TABLET | Freq: Every day | ORAL | Status: DC
  Administered 2023-03-25 – 2023-03-26 (×2): 20 meq via ORAL
  Filled 2023-03-25 (×2): qty 1

## 2023-03-25 MED ORDER — LIDOCAINE HCL 1 % IJ SOLN
INTRAMUSCULAR | Status: AC
  Filled 2023-03-25: qty 20

## 2023-03-25 SURGICAL SUPPLY — 28 items
BALLN TREK RX 2.25X12 (BALLOONS) ×4
BALLN TREK RX 2.5X12 (BALLOONS) ×2
BALLOON TAKERU 2.0X12 (BALLOONS) IMPLANT
BALLOON TREK RX 2.25X12 (BALLOONS) IMPLANT
BALLOON TREK RX 2.5X12 (BALLOONS) IMPLANT
CATH INFINITI 5 FR AL2 (CATHETERS) IMPLANT
CATH INFINITI 5FR AL1 (CATHETERS) IMPLANT
CATH INFINITI 5FR JL5 (CATHETERS) IMPLANT
CATH INFINITI 5FR MULTPACK ANG (CATHETERS) IMPLANT
CATH LAUNCHER 5F EBU3.5 (CATHETERS) IMPLANT
CATH VISTA GUIDE 6FR XB4 (CATHETERS) IMPLANT
DEVICE CLOSURE MYNXGRIP 6/7F (Vascular Products) IMPLANT
DRAPE BRACHIAL (DRAPES) IMPLANT
KIT ENCORE 26 ADVANTAGE (KITS) IMPLANT
KIT SYRINGE INJ CVI SPIKEX1 (MISCELLANEOUS) IMPLANT
NDL PERC 18GX7CM (NEEDLE) IMPLANT
NEEDLE PERC 18GX7CM (NEEDLE) ×2 IMPLANT
PACK CARDIAC CATH (CUSTOM PROCEDURE TRAY) ×2 IMPLANT
PROTECTION STATION PRESSURIZED (MISCELLANEOUS) ×2
SET ATX-X65L (MISCELLANEOUS) IMPLANT
SHEATH AVANTI 5FR X 11CM (SHEATH) IMPLANT
SHEATH AVANTI 6FR X 11CM (SHEATH) IMPLANT
STATION PROTECTION PRESSURIZED (MISCELLANEOUS) IMPLANT
STENT ONYX FRONTIER 2.75X15 (Permanent Stent) IMPLANT
TUBING CIL FLEX 10 FLL-RA (TUBING) IMPLANT
WIRE ASAHI PROWATER 180CM (WIRE) IMPLANT
WIRE G HI TQ BMW 190 (WIRE) IMPLANT
WIRE GUIDERIGHT .035X150 (WIRE) IMPLANT

## 2023-03-26 DIAGNOSIS — I25118 Atherosclerotic heart disease of native coronary artery with other forms of angina pectoris: Secondary | ICD-10-CM | POA: Diagnosis not present

## 2023-03-26 DIAGNOSIS — Z96651 Presence of right artificial knee joint: Secondary | ICD-10-CM | POA: Diagnosis not present

## 2023-03-26 DIAGNOSIS — I10 Essential (primary) hypertension: Secondary | ICD-10-CM | POA: Diagnosis not present

## 2023-03-26 DIAGNOSIS — Z9861 Coronary angioplasty status: Secondary | ICD-10-CM | POA: Diagnosis not present

## 2023-03-26 DIAGNOSIS — Z853 Personal history of malignant neoplasm of breast: Secondary | ICD-10-CM | POA: Diagnosis not present

## 2023-03-26 DIAGNOSIS — Z7982 Long term (current) use of aspirin: Secondary | ICD-10-CM | POA: Diagnosis not present

## 2023-03-26 DIAGNOSIS — Z79899 Other long term (current) drug therapy: Secondary | ICD-10-CM | POA: Diagnosis not present

## 2023-03-26 DIAGNOSIS — E1165 Type 2 diabetes mellitus with hyperglycemia: Secondary | ICD-10-CM | POA: Diagnosis not present

## 2023-03-26 DIAGNOSIS — I2510 Atherosclerotic heart disease of native coronary artery without angina pectoris: Secondary | ICD-10-CM | POA: Diagnosis not present

## 2023-03-26 DIAGNOSIS — Z8673 Personal history of transient ischemic attack (TIA), and cerebral infarction without residual deficits: Secondary | ICD-10-CM | POA: Diagnosis not present

## 2023-03-26 LAB — BASIC METABOLIC PANEL
Anion gap: 10 (ref 5–15)
BUN: 25 mg/dL — ABNORMAL HIGH (ref 8–23)
CO2: 25 mmol/L (ref 22–32)
Calcium: 9.5 mg/dL (ref 8.9–10.3)
Chloride: 98 mmol/L (ref 98–111)
Creatinine, Ser: 0.68 mg/dL (ref 0.44–1.00)
GFR, Estimated: >60 mL/min (ref >60)
Glucose, Bld: 217 mg/dL — ABNORMAL HIGH (ref 70–99)
Potassium: 4.2 mmol/L (ref 3.5–5.1)
Sodium: 133 mmol/L — ABNORMAL LOW (ref 135–145)

## 2023-03-26 LAB — CBC
HCT: 37.2 % (ref 36.0–46.0)
Hemoglobin: 13.6 g/dL (ref 12.0–15.0)
MCH: 29.1 pg (ref 26.0–34.0)
MCHC: 36.6 g/dL — ABNORMAL HIGH (ref 30.0–36.0)
MCV: 79.5 fL — ABNORMAL LOW (ref 80.0–100.0)
Platelets: 291 10*3/uL (ref 150–400)
RBC: 4.68 MIL/uL (ref 3.87–5.11)
RDW: 14.6 % (ref 11.5–15.5)
WBC: 14.1 10*3/uL — ABNORMAL HIGH (ref 4.0–10.5)
nRBC: 0 % (ref 0.0–0.2)

## 2023-03-26 MED ORDER — CLOPIDOGREL BISULFATE 75 MG PO TABS: 75.0000 mg | ORAL_TABLET | Freq: Every day | ORAL | 0 refills | Status: DC

## 2023-03-26 NOTE — Discharge Summary (Signed)
Discharge Summary       Patient ID: Elaine Cox MRN: [id number] DOB/AGE: 1945-10-19 77 y.o.  Admit date: 03/25/2023 Discharge date: 03/26/2023  Primary Discharge Diagnosis Coronary artery disease with angina Secondary Discharge Diagnosis S/p coronary stent placement  Significant Diagnostic Studies: Left heart catheterization  Consults: None  Hospital Course: The patient was brought to the cardiac cath lab and underwent left heart catheterization and coronary angiography with Dr. Darrold Junker on 03/25/23. The patient tolerated with procedure well without complications.  The patient received ONYX FRONTIER 2.75X15 stent to the ostial to prox left circumflex. On 03/26/2023 the R groin access site was examined and found to be without significant erythema, tenderness to palpation, or apparent aneurysm. Hospital course was overall uneventful, on day of discharge the patient was ambulatory and eager to go home.  Discussed cardiac rehab and new prescription medications in detail. The patient was given aftercare instructions and ER return precautions. Will arrange for follow up in office in 1 week, or sooner if needed.      Discharge Exam: Blood pressure (!) 131/56, pulse 73, temperature 98.6 F (37 C), resp. rate 18, height 5\' 2"  (1.575 m), weight 78.4 kg, SpO2 98%.   General: Well appearing female, well nourished, in no acute distress.  HEENT:  Normocephalic and atraumatic. Neck:   No JVD.  Lungs: Normal respiratory effort on room air.  Clear to ascultation bilaterally. Heart: HRRR . Normal S1 and S2 without gallops or murmurs.  Abdomen: non-distended appearing.  Msk: Normal strength and tone for age. Extremities: No peripheral edema. R groin access site without significant tenderness to palpation, apparent aneurysm, or significant ecchymosis. Covered with clean gauze and tegaderm dressing.  Neuro: Alert and oriented x3 Psych:  calm and cooperative.   Labs:   Lab Results   Component Value Date   WBC 14.1 (H) 03/26/2023   HGB 13.6 03/26/2023   HCT 37.2 03/26/2023   MCV 79.5 (L) 03/26/2023   PLT 291 03/26/2023    Recent Labs  Lab 03/26/23 0613  NA 133*  K 4.2  CL 98  CO2 25  BUN 25*  CREATININE 0.68  CALCIUM 9.5  GLUCOSE 217*      Radiology: None EKG: NSR rate 71 bpm  FOLLOW UP PLANS AND APPOINTMENTS Discharge Instructions     AMB Referral to Cardiac Rehabilitation - Phase II   Complete by: As directed    Diagnosis: Coronary Stents   After initial evaluation and assessments completed: Virtual Based Care may be provided alone or in conjunction with Phase 2 Cardiac Rehab based on patient barriers.: Yes   Intensive Cardiac Rehabilitation (ICR) [facility] location only OR Traditional Cardiac Rehabilitation (TCR) *If criteria for ICR are not met will enroll in TCR [facility] only): Yes      Allergies as of 03/26/2023       Reactions   Doxazosin Other (See Comments)   "headache"   Fish Allergy Anaphylaxis, Itching, Nausea Only, Swelling   She states her tongue turns black.   Dicloxacillin Nausea And Vomiting   Has patient had a PCN reaction causing immediate rash, facial/tongue/throat swelling, SOB or lightheadedness with hypotension: No Has patient had a PCN reaction causing severe rash involving mucus membranes or skin necrosis: No Has patient had a PCN reaction that required hospitalization: Unknown Has patient had a PCN reaction occurring within the last 10 years: Unknown If all of the above answers are "NO", then may proceed with Cephalosporin use.   Iodine Hives   Per  patient, she had a previous reaction/hives from an iodine injection   Sulfa Antibiotics Swelling   Loss of appetite   Constipation bloating Other reaction(s): Headache   Cardura [doxazosin Mesylate]    Headache    Etodolac Itching, Swelling   Sulfamethoxazole-trimethoprim Nausea Only   Azithromycin Itching   Ciprofloxacin Nausea Only, Rash   Clopidogrel Nausea And Vomiting    Colchicine Nausea Only, Other (See Comments)   Dizzness   Glucosamine Nausea And Vomiting, Other (See Comments)   Leflunomide Diarrhea, Itching   Meloxicam Itching   Methotrexate Nausea Only   Zoloft [sertraline] Nausea And Vomiting        Medication List     TAKE these medications    Accu-Chek Guide test strip Generic drug: glucose blood   acetaminophen 325 MG tablet Commonly known as: TYLENOL Take 2 tablets (650 mg total) by mouth every 6 (six) hours as needed for mild pain (or Fever >/= 101). What changed: how much to take   Align 4 MG Caps Take 8 mg by mouth daily.   ALPRAZolam 0.25 MG tablet Commonly known as: XANAX Take 0.25 mg by mouth at bedtime.   amLODipine 10 MG tablet Commonly known as: NORVASC Take 10 mg by mouth daily.   APPLE CIDER VINEGAR PO Take 1 capsule by mouth daily.   aspirin EC 81 MG tablet Take 1 tablet (81 mg total) by mouth daily. Swallow whole.   B-D SINGLE USE SWABS REGULAR Pads   Biotin 5000 MCG Tabs Take 10,000 mcg by mouth daily.   clopidogrel 75 MG tablet Commonly known as: PLAVIX Take 1 tablet (75 mg total) by mouth daily with breakfast.   Fluocinolone Acetonide 0.01 % Oil Place 3-4 drops in ear(s) at bedtime as needed (Dry eyes).   glimepiride 4 MG tablet Commonly known as: AMARYL Take 4 mg by mouth daily with breakfast.   isosorbide mononitrate 60 MG 24 hr tablet Commonly known as: IMDUR Take 60 mg by mouth daily.   metoprolol succinate 50 MG 24 hr tablet Commonly known as: TOPROL-XL Take 50 mg by mouth daily.   multivitamin with minerals tablet Take 1 tablet by mouth daily.   Muscle Rub 10-15 % Crea Apply 1 application topically as needed for muscle pain.   nitroGLYCERIN 0.4 MG SL tablet Commonly known as: NITROSTAT Place 0.4 mg under the tongue daily as needed for chest pain.   omeprazole 20 MG capsule Commonly known as: PRILOSEC Take 20 mg by mouth 2 (two) times daily before a meal.   potassium  chloride 10 MEQ tablet Commonly known as: KLOR-CON Take 10 mEq by mouth 4 (four) times daily.   predniSONE 5 MG tablet Commonly known as: DELTASONE Take 5 mg by mouth daily with breakfast.   Restasis 0.05 % ophthalmic emulsion Generic drug: cycloSPORINE Place 1 drop into both eyes daily as needed (Dry eyes).   rosuvastatin 10 MG tablet Commonly known as: CRESTOR Take 10 mg by mouth daily.   True Metrix Level 1 Low Soln   Voltaren 1 % Gel Generic drug: diclofenac Sodium Apply 2 g topically daily as needed (pain).        Follow-up Information     Germaine Pomfret, Eliane Decree, NP. Go in 1 week(s).   Specialty: Nurse Practitioner Contact information: [address] [place] [address] [phone]                 PLEASE BRING ALL MEDICATIONS WITH YOU TO FOLLOW UP APPOINTMENTS  Time spent  with patient: 75  Signed: Gale Journey PA-C 03/26/2023, 9:35 AM [facility]

## 2023-03-28 LAB — LIPOPROTEIN A (LPA): Lipoprotein (a): 144.4 nmol/L — ABNORMAL HIGH (ref <75.0)

## 2023-04-02 ENCOUNTER — Other Ambulatory Visit
Admission: RE | Admit: 2023-04-02 | Discharge: 2023-04-02 | Disposition: A | Discharge status: Home / Self Care | Payer: Medicare HMO | Source: Ambulatory Visit | Attending: Nurse Practitioner | Admitting: Nurse Practitioner

## 2023-04-02 DIAGNOSIS — R600 Localized edema: Secondary | ICD-10-CM | POA: Diagnosis not present

## 2023-04-02 DIAGNOSIS — I499 Cardiac arrhythmia, unspecified: Secondary | ICD-10-CM | POA: Diagnosis not present

## 2023-04-02 DIAGNOSIS — Z8673 Personal history of transient ischemic attack (TIA), and cerebral infarction without residual deficits: Secondary | ICD-10-CM | POA: Diagnosis not present

## 2023-04-02 DIAGNOSIS — R0609 Other forms of dyspnea: Secondary | ICD-10-CM | POA: Diagnosis not present

## 2023-04-02 DIAGNOSIS — I10 Essential (primary) hypertension: Secondary | ICD-10-CM | POA: Diagnosis not present

## 2023-04-02 DIAGNOSIS — E1165 Type 2 diabetes mellitus with hyperglycemia: Secondary | ICD-10-CM | POA: Diagnosis not present

## 2023-04-02 DIAGNOSIS — E7849 Other hyperlipidemia: Secondary | ICD-10-CM | POA: Diagnosis not present

## 2023-04-02 DIAGNOSIS — I2510 Atherosclerotic heart disease of native coronary artery without angina pectoris: Secondary | ICD-10-CM | POA: Diagnosis not present

## 2023-04-02 DIAGNOSIS — I214 Non-ST elevation (NSTEMI) myocardial infarction: Secondary | ICD-10-CM | POA: Diagnosis not present

## 2023-04-02 DIAGNOSIS — R42 Dizziness and giddiness: Secondary | ICD-10-CM | POA: Diagnosis not present

## 2023-04-02 LAB — BRAIN NATRIURETIC PEPTIDE: B Natriuretic Peptide: 109 pg/mL — ABNORMAL HIGH (ref 0.0–100.0)

## 2023-06-22 ENCOUNTER — Other Ambulatory Visit: Payer: Self-pay | Admitting: Rheumatology

## 2023-06-22 DIAGNOSIS — M24522 Contracture, left elbow: Secondary | ICD-10-CM

## 2023-06-22 DIAGNOSIS — M0579 Rheumatoid arthritis with rheumatoid factor of multiple sites without organ or systems involvement: Secondary | ICD-10-CM

## 2023-06-23 ENCOUNTER — Ambulatory Visit
Admission: RE | Admit: 2023-06-23 | Discharge: 2023-06-23 | Disposition: A | Discharge status: Home / Self Care | Source: Ambulatory Visit | Attending: Rheumatology | Admitting: Rheumatology

## 2023-06-23 DIAGNOSIS — M0579 Rheumatoid arthritis with rheumatoid factor of multiple sites without organ or systems involvement: Secondary | ICD-10-CM

## 2023-06-23 DIAGNOSIS — M24522 Contracture, left elbow: Secondary | ICD-10-CM

## 2023-06-27 ENCOUNTER — Other Ambulatory Visit

## 2023-07-03 ENCOUNTER — Ambulatory Visit
Admission: RE | Admit: 2023-07-03 | Discharge: 2023-07-03 | Disposition: A | Discharge status: Home / Self Care | Source: Ambulatory Visit | Attending: Rheumatology | Admitting: Rheumatology

## 2023-07-14 ENCOUNTER — Other Ambulatory Visit

## 2023-07-18 ENCOUNTER — Emergency Department
Admission: EM | Admit: 2023-07-18 | Discharge: 2023-07-18 | Disposition: A | Discharge status: Home / Self Care | Attending: Emergency Medicine | Admitting: Emergency Medicine

## 2023-07-18 ENCOUNTER — Other Ambulatory Visit: Payer: Self-pay

## 2023-07-18 ENCOUNTER — Emergency Department

## 2023-07-18 DIAGNOSIS — E119 Type 2 diabetes mellitus without complications: Secondary | ICD-10-CM | POA: Insufficient documentation

## 2023-07-18 DIAGNOSIS — R1084 Generalized abdominal pain: Secondary | ICD-10-CM | POA: Diagnosis not present

## 2023-07-18 DIAGNOSIS — R197 Diarrhea, unspecified: Secondary | ICD-10-CM | POA: Insufficient documentation

## 2023-07-18 DIAGNOSIS — R112 Nausea with vomiting, unspecified: Secondary | ICD-10-CM | POA: Diagnosis not present

## 2023-07-18 LAB — CBC
HCT: 40.3 % (ref 36.0–46.0)
Hemoglobin: 14.8 g/dL (ref 12.0–15.0)
MCH: 29.5 pg (ref 26.0–34.0)
MCHC: 36.7 g/dL — ABNORMAL HIGH (ref 30.0–36.0)
MCV: 80.4 fL (ref 80.0–100.0)
Platelets: 361 10*3/uL (ref 150–400)
RBC: 5.01 MIL/uL (ref 3.87–5.11)
RDW: 13.1 % (ref 11.5–15.5)
WBC: 8.3 10*3/uL (ref 4.0–10.5)
nRBC: 0 % (ref 0.0–0.2)

## 2023-07-18 LAB — COMPREHENSIVE METABOLIC PANEL WITH GFR
ALT: 13 U/L (ref 0–44)
AST: 21 U/L (ref 15–41)
Albumin: 3.3 g/dL — ABNORMAL LOW (ref 3.5–5.0)
Alkaline Phosphatase: 47 U/L (ref 38–126)
Anion gap: 10 (ref 5–15)
BUN: 6 mg/dL — ABNORMAL LOW (ref 8–23)
CO2: 26 mmol/L (ref 22–32)
Calcium: 9.3 mg/dL (ref 8.9–10.3)
Chloride: 96 mmol/L — ABNORMAL LOW (ref 98–111)
Creatinine, Ser: 0.62 mg/dL (ref 0.44–1.00)
GFR, Estimated: >60 mL/min (ref >60)
Glucose, Bld: 244 mg/dL — ABNORMAL HIGH (ref 70–99)
Potassium: 4 mmol/L (ref 3.5–5.1)
Sodium: 132 mmol/L — ABNORMAL LOW (ref 135–145)
Total Bilirubin: 0.9 mg/dL (ref 0.0–1.2)
Total Protein: 7.9 g/dL (ref 6.5–8.1)

## 2023-07-18 LAB — LIPASE, BLOOD: Lipase: 32 U/L (ref 11–51)

## 2023-07-18 MED ORDER — DIPHENHYDRAMINE HCL 50 MG/ML IJ SOLN
50.0000 mg | Freq: Once | INTRAMUSCULAR | Status: AC
  Administered 2023-07-18: 50 mg via INTRAVENOUS
  Filled 2023-07-18: qty 1

## 2023-07-18 MED ORDER — DIPHENHYDRAMINE HCL 25 MG PO CAPS: 50.0000 mg | ORAL_CAPSULE | Freq: Once | ORAL | Status: AC

## 2023-07-18 MED ORDER — IOHEXOL 300 MG/ML  SOLN
100.0000 mL | Freq: Once | INTRAMUSCULAR | Status: AC | PRN
  Administered 2023-07-18: 100 mL via INTRAVENOUS

## 2023-07-18 MED ORDER — ONDANSETRON HCL 4 MG/2ML IJ SOLN
4.0000 mg | Freq: Once | INTRAMUSCULAR | Status: AC
  Administered 2023-07-18: 4 mg via INTRAVENOUS
  Filled 2023-07-18: qty 2

## 2023-07-18 MED ORDER — MORPHINE SULFATE (PF) 2 MG/ML IV SOLN
2.0000 mg | Freq: Once | INTRAVENOUS | Status: AC
  Administered 2023-07-18: 2 mg via INTRAVENOUS
  Filled 2023-07-18: qty 1

## 2023-07-18 MED ORDER — SODIUM CHLORIDE 0.9 % IV SOLN: Freq: Once | INTRAVENOUS | Status: AC

## 2023-07-18 MED ORDER — OXYCODONE-ACETAMINOPHEN 5-325 MG PO TABS
1.0000 | ORAL_TABLET | Freq: Once | ORAL | Status: AC
  Administered 2023-07-18: 1 via ORAL
  Filled 2023-07-18: qty 1

## 2023-07-18 MED ORDER — METHYLPREDNISOLONE SODIUM SUCC 40 MG IJ SOLR
40.0000 mg | Freq: Once | INTRAMUSCULAR | Status: AC
  Administered 2023-07-18: 40 mg via INTRAVENOUS
  Filled 2023-07-18: qty 1

## 2023-07-18 NOTE — ED Triage Notes (Signed)
 C/O diarrhea x 2 weeks.

## 2023-07-18 NOTE — Discharge Instructions (Signed)
 Please follow-up as planned with your rheumatology team.  Pick up your pain medicine tomorrow and use as needed.

## 2023-07-18 NOTE — ED Provider Notes (Signed)
 [facility] Provider Note    Event Date/Time   First MD Initiated Contact with Patient 07/18/23 1338     (approximate)   History   Diarrhea   HPI  Elaine Cox is a 78 y.o. female with history of diabetes who presents with complaints of diarrhea.  Patient reports last week she had intermittent loose stools, this week she has diarrhea over the last 2 to 3 days and also has nausea with abdominal discomfort diffusely.  No fevers reported.  Has not take anything for this.  No antibiotics in the last month.  Nonbloody diarrhea.  No recent traveling.     Physical Exam   Triage Vital Signs: ED Triage Vitals  Encounter Vitals Group     BP 07/18/23 1326 129/78     Systolic BP Percentile --      Diastolic BP Percentile --      Pulse Rate 07/18/23 1326 91     Resp 07/18/23 1326 16     Temp 07/18/23 1326 98.8 F (37.1 C)     Temp Source 07/18/23 1326 Oral     SpO2 07/18/23 1326 100 %     Weight 07/18/23 1325 78.4 kg (172 lb 13.5 oz)     Height --      Head Circumference --      Peak Flow --      Pain Score 07/18/23 1325 0     Pain Loc --      Pain Education --      Exclude from Growth Chart --     Most recent vital signs: Vitals:   07/18/23 1326  BP: 129/78  Pulse: 91  Resp: 16  Temp: 98.8 F (37.1 C)  SpO2: 100%     General: Awake, no distress.  CV:  Good peripheral perfusion.  Resp:  Normal effort.  Abd:  No distention.  Mild tenderness diffusely Other:     ED Results / Procedures / Treatments   Labs (all labs ordered are listed, but only abnormal results are displayed) Labs Reviewed  COMPREHENSIVE METABOLIC PANEL WITH GFR - Abnormal; Notable for the following components:      Result Value   Sodium 132 (*)    Chloride 96 (*)    Glucose, Bld 244 (*)    BUN 6 (*)    Albumin 3.3 (*)    All other components within normal limits  CBC - Abnormal; Notable for the following components:   MCHC 36.7 (*)    All other components  within normal limits  GASTROINTESTINAL PANEL BY PCR, STOOL (REPLACES STOOL CULTURE)  C DIFFICILE QUICK SCREEN W PCR REFLEX    LIPASE, BLOOD  URINALYSIS, ROUTINE W REFLEX MICROSCOPIC     EKG     RADIOLOGY CT abdomen pelvis pending    PROCEDURES:  Critical Care performed:   Procedures   MEDICATIONS ORDERED IN ED: Medications  0.9 %  sodium chloride infusion (has no administration in time range)  morphine (PF) 2 MG/ML injection 2 mg (has no administration in time range)  ondansetron (ZOFRAN) injection 4 mg (has no administration in time range)     IMPRESSION / MDM / ASSESSMENT AND PLAN / ED COURSE  I reviewed the triage vital signs and the nursing notes. Patient's presentation is most consistent with acute presentation with potential threat to life or bodily function.  Patient presents with abdominal pain, nausea vomiting diarrhea as detailed above, differential includes infectious diarrhea including C. difficile, gastroenteritis/norovirus, electrolyte  abnormalities, dehydration, diverticulitis  Lab work is overall reassuring, white blood cell count is normal making C. difficile less likely, will send for CT abdomen pelvis given her abdominal pain, will treat with IV fluids, IV Zofran, IV morphine and reevaluate  Pending CT scan and treatment, have asked my colleague to reevaluate        FINAL CLINICAL IMPRESSION(S) / ED DIAGNOSES   Final diagnoses:  Diarrhea, unspecified type     Rx / DC Orders   ED Discharge Orders     None        Note:  This document was prepared using Dragon voice recognition software and may include unintentional dictation errors.   Bryson Carbine, MD 07/18/23 1435

## 2023-07-18 NOTE — ED Provider Notes (Signed)
  Physical Exam  BP 119/68   Pulse 63   Temp 98.5 F (36.9 C) (Oral)   Resp 16   Wt 78.4 kg   LMP  (LMP Unknown)   SpO2 95%   BMI 31.61 kg/m   Physical Exam  Procedures  Procedures  ED Course / MDM    Medical Decision Making Amount and/or Complexity of Data Reviewed Labs: ordered. Radiology: ordered.  Risk Prescription drug management.   Received patient in signout.  78 year old female presenting today for diarrhea over the past several days associated with some generalized nausea and abdominal discomfort.  Laboratory workup initially largely reassuring.  CT imaging ordered by initial provider as well as stool cultures.  Signed out pending CT scan and reevaluation after medications.  CT imaging negative for any acute intra-abdominal pathology.  No ongoing diarrhea.  No ongoing nausea or vomiting.  Otherwise safe for discharge at this time will follow-up as planned with her outpatient providers.   Kandee Orion, MD 07/18/23 2036

## 2023-09-15 ENCOUNTER — Emergency Department

## 2023-09-15 ENCOUNTER — Inpatient Hospital Stay
Admission: EM | Admit: 2023-09-15 | Discharge: 2023-09-17 | DRG: 641 | Disposition: A | Discharge status: Home Health | Attending: Internal Medicine | Admitting: Internal Medicine

## 2023-09-15 DIAGNOSIS — Z79899 Other long term (current) drug therapy: Secondary | ICD-10-CM

## 2023-09-15 DIAGNOSIS — E861 Hypovolemia: Secondary | ICD-10-CM | POA: Diagnosis present

## 2023-09-15 DIAGNOSIS — Z8673 Personal history of transient ischemic attack (TIA), and cerebral infarction without residual deficits: Secondary | ICD-10-CM

## 2023-09-15 DIAGNOSIS — Z882 Allergy status to sulfonamides status: Secondary | ICD-10-CM

## 2023-09-15 DIAGNOSIS — Z7902 Long term (current) use of antithrombotics/antiplatelets: Secondary | ICD-10-CM

## 2023-09-15 DIAGNOSIS — R112 Nausea with vomiting, unspecified: Secondary | ICD-10-CM | POA: Diagnosis not present

## 2023-09-15 DIAGNOSIS — E66811 Obesity, class 1: Secondary | ICD-10-CM | POA: Diagnosis present

## 2023-09-15 DIAGNOSIS — F419 Anxiety disorder, unspecified: Secondary | ICD-10-CM | POA: Diagnosis present

## 2023-09-15 DIAGNOSIS — Z853 Personal history of malignant neoplasm of breast: Secondary | ICD-10-CM

## 2023-09-15 DIAGNOSIS — I639 Cerebral infarction, unspecified: Secondary | ICD-10-CM | POA: Diagnosis present

## 2023-09-15 DIAGNOSIS — Z7982 Long term (current) use of aspirin: Secondary | ICD-10-CM

## 2023-09-15 DIAGNOSIS — Z9011 Acquired absence of right breast and nipple: Secondary | ICD-10-CM

## 2023-09-15 DIAGNOSIS — R079 Chest pain, unspecified: Secondary | ICD-10-CM | POA: Diagnosis present

## 2023-09-15 DIAGNOSIS — I2489 Other forms of acute ischemic heart disease: Secondary | ICD-10-CM | POA: Diagnosis present

## 2023-09-15 DIAGNOSIS — R197 Diarrhea, unspecified: Secondary | ICD-10-CM | POA: Diagnosis present

## 2023-09-15 DIAGNOSIS — E785 Hyperlipidemia, unspecified: Secondary | ICD-10-CM | POA: Diagnosis present

## 2023-09-15 DIAGNOSIS — Z888 Allergy status to other drugs, medicaments and biological substances status: Secondary | ICD-10-CM

## 2023-09-15 DIAGNOSIS — Z955 Presence of coronary angioplasty implant and graft: Secondary | ICD-10-CM

## 2023-09-15 DIAGNOSIS — Z7984 Long term (current) use of oral hypoglycemic drugs: Secondary | ICD-10-CM

## 2023-09-15 DIAGNOSIS — Z96651 Presence of right artificial knee joint: Secondary | ICD-10-CM | POA: Diagnosis present

## 2023-09-15 DIAGNOSIS — I2510 Atherosclerotic heart disease of native coronary artery without angina pectoris: Secondary | ICD-10-CM | POA: Diagnosis present

## 2023-09-15 DIAGNOSIS — Z886 Allergy status to analgesic agent status: Secondary | ICD-10-CM

## 2023-09-15 DIAGNOSIS — Z6831 Body mass index (BMI) 31.0-31.9, adult: Secondary | ICD-10-CM

## 2023-09-15 DIAGNOSIS — M059 Rheumatoid arthritis with rheumatoid factor, unspecified: Secondary | ICD-10-CM | POA: Diagnosis present

## 2023-09-15 DIAGNOSIS — I252 Old myocardial infarction: Secondary | ICD-10-CM

## 2023-09-15 DIAGNOSIS — Z803 Family history of malignant neoplasm of breast: Secondary | ICD-10-CM

## 2023-09-15 DIAGNOSIS — Z87442 Personal history of urinary calculi: Secondary | ICD-10-CM

## 2023-09-15 DIAGNOSIS — Z90710 Acquired absence of both cervix and uterus: Secondary | ICD-10-CM

## 2023-09-15 DIAGNOSIS — E669 Obesity, unspecified: Secondary | ICD-10-CM | POA: Diagnosis present

## 2023-09-15 DIAGNOSIS — I10 Essential (primary) hypertension: Secondary | ICD-10-CM | POA: Diagnosis not present

## 2023-09-15 DIAGNOSIS — Z881 Allergy status to other antibiotic agents status: Secondary | ICD-10-CM

## 2023-09-15 DIAGNOSIS — Z880 Allergy status to penicillin: Secondary | ICD-10-CM

## 2023-09-15 DIAGNOSIS — Z9221 Personal history of antineoplastic chemotherapy: Secondary | ICD-10-CM

## 2023-09-15 DIAGNOSIS — K219 Gastro-esophageal reflux disease without esophagitis: Secondary | ICD-10-CM | POA: Diagnosis present

## 2023-09-15 DIAGNOSIS — Z833 Family history of diabetes mellitus: Secondary | ICD-10-CM

## 2023-09-15 DIAGNOSIS — E119 Type 2 diabetes mellitus without complications: Secondary | ICD-10-CM | POA: Diagnosis present

## 2023-09-15 DIAGNOSIS — Z8249 Family history of ischemic heart disease and other diseases of the circulatory system: Secondary | ICD-10-CM

## 2023-09-15 DIAGNOSIS — E871 Hypo-osmolality and hyponatremia: Secondary | ICD-10-CM | POA: Diagnosis not present

## 2023-09-15 LAB — CBC
HCT: 35.7 % — ABNORMAL LOW (ref 36.0–46.0)
Hemoglobin: 13.4 g/dL (ref 12.0–15.0)
MCH: 30.5 pg (ref 26.0–34.0)
MCHC: 37.5 g/dL — ABNORMAL HIGH (ref 30.0–36.0)
MCV: 81.3 fL (ref 80.0–100.0)
Platelets: 292 10*3/uL (ref 150–400)
RBC: 4.39 MIL/uL (ref 3.87–5.11)
RDW: 14 % (ref 11.5–15.5)
WBC: 8 10*3/uL (ref 4.0–10.5)
nRBC: 0 % (ref 0.0–0.2)

## 2023-09-15 LAB — BASIC METABOLIC PANEL WITH GFR
Anion gap: 10 (ref 5–15)
BUN: <5 mg/dL — ABNORMAL LOW (ref 8–23)
CO2: 23 mmol/L (ref 22–32)
Calcium: 8.8 mg/dL — ABNORMAL LOW (ref 8.9–10.3)
Chloride: 93 mmol/L — ABNORMAL LOW (ref 98–111)
Creatinine, Ser: 0.48 mg/dL (ref 0.44–1.00)
GFR, Estimated: >60 mL/min (ref >60)
Glucose, Bld: 170 mg/dL — ABNORMAL HIGH (ref 70–99)
Potassium: 4.1 mmol/L (ref 3.5–5.1)
Sodium: 126 mmol/L — ABNORMAL LOW (ref 135–145)

## 2023-09-15 LAB — TROPONIN I (HIGH SENSITIVITY)
Troponin I (High Sensitivity): 15 ng/L (ref <18)
Troponin I (High Sensitivity): 16 ng/L (ref <18)

## 2023-09-15 LAB — MAGNESIUM: Magnesium: 1.7 mg/dL (ref 1.7–2.4)

## 2023-09-15 LAB — PHOSPHORUS: Phosphorus: 2.5 mg/dL (ref 2.5–4.6)

## 2023-09-15 MED ORDER — NITROGLYCERIN 0.4 MG SL SUBL
0.4000 mg | SUBLINGUAL_TABLET | SUBLINGUAL | Status: DC | PRN
  Administered 2023-09-16: 0.4 mg via SUBLINGUAL
  Filled 2023-09-15: qty 1

## 2023-09-15 MED ORDER — ACETAMINOPHEN 325 MG PO TABS: 650.0000 mg | ORAL_TABLET | Freq: Four times a day (QID) | ORAL | Status: DC | PRN

## 2023-09-15 MED ORDER — ASPIRIN 81 MG PO TBEC
81.0000 mg | DELAYED_RELEASE_TABLET | Freq: Every day | ORAL | Status: DC
  Administered 2023-09-16 – 2023-09-17 (×2): 81 mg via ORAL
  Filled 2023-09-15 (×2): qty 1

## 2023-09-15 MED ORDER — ENOXAPARIN SODIUM 40 MG/0.4ML IJ SOSY
40.0000 mg | PREFILLED_SYRINGE | INTRAMUSCULAR | Status: DC
  Administered 2023-09-16 – 2023-09-17 (×2): 40 mg via SUBCUTANEOUS
  Filled 2023-09-15 (×2): qty 0.4

## 2023-09-15 MED ORDER — PANTOPRAZOLE SODIUM 20 MG PO TBEC
20.0000 mg | DELAYED_RELEASE_TABLET | Freq: Two times a day (BID) | ORAL | Status: DC
  Administered 2023-09-16: 20 mg via ORAL
  Filled 2023-09-15: qty 1

## 2023-09-15 MED ORDER — INSULIN ASPART 100 UNIT/ML IJ SOLN: 0.0000 [IU] | Freq: Every day | INTRAMUSCULAR | Status: DC

## 2023-09-15 MED ORDER — OXYCODONE-ACETAMINOPHEN 5-325 MG PO TABS
1.0000 | ORAL_TABLET | Freq: Once | ORAL | Status: AC
  Administered 2023-09-15: 1 via ORAL
  Filled 2023-09-15: qty 1

## 2023-09-15 MED ORDER — SODIUM CHLORIDE 0.9 % IV SOLN
12.5000 mg | Freq: Three times a day (TID) | INTRAVENOUS | Status: DC | PRN
  Administered 2023-09-16: 12.5 mg via INTRAVENOUS

## 2023-09-15 MED ORDER — CLOPIDOGREL BISULFATE 75 MG PO TABS
75.0000 mg | ORAL_TABLET | Freq: Every day | ORAL | Status: DC
  Administered 2023-09-16 – 2023-09-17 (×2): 75 mg via ORAL
  Filled 2023-09-15 (×2): qty 1

## 2023-09-15 MED ORDER — ISOSORBIDE MONONITRATE ER 60 MG PO TB24: 60.0000 mg | ORAL_TABLET | Freq: Every day | ORAL | Status: DC

## 2023-09-15 MED ORDER — MORPHINE SULFATE (PF) 2 MG/ML IV SOLN: 1.0000 mg | INTRAVENOUS | Status: DC | PRN

## 2023-09-15 MED ORDER — SODIUM CHLORIDE 0.9 % IV SOLN: INTRAVENOUS | Status: AC

## 2023-09-15 MED ORDER — SODIUM CHLORIDE 0.9 % IV SOLN
12.5000 mg | Freq: Once | INTRAVENOUS | Status: DC
  Filled 2023-09-15: qty 0.5

## 2023-09-15 MED ORDER — PREDNISONE 10 MG PO TABS: 10.0000 mg | ORAL_TABLET | Freq: Every day | ORAL | Status: DC

## 2023-09-15 MED ORDER — LACTATED RINGERS IV BOLUS
1000.0000 mL | Freq: Once | INTRAVENOUS | Status: AC
  Administered 2023-09-16: 1000 mL via INTRAVENOUS

## 2023-09-15 MED ORDER — ALPRAZOLAM 0.25 MG PO TABS
0.2500 mg | ORAL_TABLET | Freq: Every day | ORAL | Status: DC
  Administered 2023-09-16: 0.25 mg via ORAL
  Filled 2023-09-15 (×2): qty 1

## 2023-09-15 MED ORDER — ONDANSETRON HCL 4 MG/2ML IJ SOLN: 4.0000 mg | Freq: Three times a day (TID) | INTRAMUSCULAR | Status: DC | PRN

## 2023-09-15 MED ORDER — CYCLOSPORINE 0.05 % OP EMUL: 1.0000 [drp] | Freq: Every day | OPHTHALMIC | Status: DC | PRN

## 2023-09-15 MED ORDER — HYDRALAZINE HCL 20 MG/ML IJ SOLN: 5.0000 mg | INTRAMUSCULAR | Status: DC | PRN

## 2023-09-15 MED ORDER — SODIUM CHLORIDE 1 G PO TABS
1.0000 g | ORAL_TABLET | Freq: Two times a day (BID) | ORAL | Status: DC
  Administered 2023-09-16 (×2): 1 g via ORAL
  Filled 2023-09-15 (×2): qty 1

## 2023-09-15 MED ORDER — INSULIN ASPART 100 UNIT/ML IJ SOLN
0.0000 [IU] | Freq: Three times a day (TID) | INTRAMUSCULAR | Status: DC
  Administered 2023-09-16: 1 [IU] via SUBCUTANEOUS
  Administered 2023-09-16: 5 [IU] via SUBCUTANEOUS
  Administered 2023-09-16: 7 [IU] via SUBCUTANEOUS
  Administered 2023-09-17: 2 [IU] via SUBCUTANEOUS
  Administered 2023-09-17: 3 [IU] via SUBCUTANEOUS
  Filled 2023-09-15 (×5): qty 1

## 2023-09-15 MED ORDER — HYDROCORTISONE SOD SUC (PF) 100 MG IJ SOLR
100.0000 mg | Freq: Once | INTRAMUSCULAR | Status: AC
  Administered 2023-09-16: 100 mg via INTRAVENOUS
  Filled 2023-09-15: qty 2

## 2023-09-15 MED ORDER — ROSUVASTATIN CALCIUM 10 MG PO TABS
10.0000 mg | ORAL_TABLET | Freq: Every day | ORAL | Status: DC
  Administered 2023-09-16 – 2023-09-17 (×2): 10 mg via ORAL
  Filled 2023-09-15 (×2): qty 1

## 2023-09-15 MED ORDER — OXYCODONE-ACETAMINOPHEN 5-325 MG PO TABS
1.0000 | ORAL_TABLET | ORAL | Status: DC | PRN
  Administered 2023-09-16 – 2023-09-17 (×2): 1 via ORAL
  Filled 2023-09-15 (×2): qty 1

## 2023-09-15 MED ORDER — RISAQUAD PO CAPS
1.0000 | ORAL_CAPSULE | Freq: Every day | ORAL | Status: DC
  Administered 2023-09-16 – 2023-09-17 (×2): 1 via ORAL
  Filled 2023-09-15 (×2): qty 1

## 2023-09-15 NOTE — ED Triage Notes (Signed)
 Pt presents to the ED POV from home for central stabbing/tight chest pain. Pt reports that the pain has been going on for while. Pt reports body aches, vomiting, diarrhea. Pt states "all the side of effects that I looked up about junevia I have it"

## 2023-09-15 NOTE — ED Provider Notes (Signed)
 [facility] Provider Note    Event Date/Time   First MD Initiated Contact with Patient 09/15/23 2149     (approximate)   History   Chief Complaint Chest Pain   HPI  Elaine Cox is a 78 y.o. female with past medical history of hypertension, hyperlipidemia, diabetes, CAD, and rheumatoid arthritis who presents to the ED complaining of chest pain.  Patient reports that she has been dealing with intermittent squeezing pain in the center of her chest for the past couple of weeks.  This does not seem to be brought on by anything in particular and she denies any fevers, cough, or difficulty breathing.  She states that she was recently started on Januvia by her PCP, has been dealing with nausea, vomiting, and diarrhea for the past 2 days.  She states that she has been unable to keep down any food for the past 2 days, denies abdominal pain or dysuria.     Physical Exam   Triage Vital Signs: ED Triage Vitals  Encounter Vitals Group     BP 09/15/23 1645 (!) 95/57     Systolic BP Percentile --      Diastolic BP Percentile --      Pulse Rate 09/15/23 1645 79     Resp 09/15/23 1645 18     Temp 09/15/23 1645 99 F (37.2 C)     Temp Source 09/15/23 1645 Oral     SpO2 09/15/23 1645 95 %     Weight 09/15/23 1647 172 lb 13.5 oz (78.4 kg)     Height 09/15/23 1647 5\' 2"  (1.575 m)     Head Circumference --      Peak Flow --      Pain Score 09/15/23 1646 9     Pain Loc --      Pain Education --      Exclude from Growth Chart --     Most recent vital signs: Vitals:   09/15/23 1958 09/15/23 2151  BP: 132/61 118/89  Pulse: 73 78  Resp: 16   Temp: 99.1 F (37.3 C)   SpO2: 99% 97%    Constitutional: Alert and oriented. Eyes: Conjunctivae are normal. Head: Atraumatic. Nose: No congestion/rhinnorhea. Mouth/Throat: Mucous membranes are moist.  Cardiovascular: Normal rate, regular rhythm. Grossly normal heart sounds.  2+ radial pulses  bilaterally. Respiratory: Normal respiratory effort.  No retractions. Lungs CTAB. Gastrointestinal: Soft and nontender. No distention. Musculoskeletal: No lower extremity tenderness nor edema.  Neurologic:  Normal speech and language. No gross focal neurologic deficits are appreciated.    ED Results / Procedures / Treatments   Labs (all labs ordered are listed, but only abnormal results are displayed) Labs Reviewed  BASIC METABOLIC PANEL WITH GFR - Abnormal; Notable for the following components:      Result Value   Sodium 126 (*)    Chloride 93 (*)    Glucose, Bld 170 (*)    BUN <5 (*)    Calcium  8.8 (*)    All other components within normal limits  CBC - Abnormal; Notable for the following components:   HCT 35.7 (*)    MCHC 37.5 (*)    All other components within normal limits  MAGNESIUM   PHOSPHORUS  OSMOLALITY  OSMOLALITY, URINE  SODIUM, URINE, RANDOM  BASIC METABOLIC PANEL WITH GFR  BASIC METABOLIC PANEL WITH GFR  CBC  TROPONIN I (HIGH SENSITIVITY)  TROPONIN I (HIGH SENSITIVITY)     EKG  ED ECG REPORT I,  Twilla Galea, the attending physician, personally viewed and interpreted this ECG.   Date: 09/15/2023  EKG Time: 16:47  Rate: 76  Rhythm: normal sinus rhythm  Axis: Normal  Intervals:none  ST&T Change: None  RADIOLOGY Chest x-ray reviewed and interpreted by me with no infiltrate, edema, or effusion.  PROCEDURES:  Critical Care performed: No  Procedures   MEDICATIONS ORDERED IN ED: Medications  lactated ringers  bolus 1,000 mL (has no administration in time range)  0.9 %  sodium chloride  infusion (has no administration in time range)  hydrALAZINE  (APRESOLINE ) injection 5 mg (has no administration in time range)  acetaminophen  (TYLENOL ) tablet 650 mg (has no administration in time range)  sodium chloride  tablet 1 g (has no administration in time range)  insulin  aspart (novoLOG ) injection 0-9 Units (has no administration in time range)  insulin   aspart (novoLOG ) injection 0-5 Units (has no administration in time range)  promethazine  (PHENERGAN ) 12.5 mg in sodium chloride  0.9 % 50 mL IVPB (has no administration in time range)  enoxaparin  (LOVENOX ) injection 40 mg (has no administration in time range)  oxyCODONE -acetaminophen  (PERCOCET/ROXICET) 5-325 MG per tablet 1 tablet (1 tablet Oral Given 09/15/23 2253)     IMPRESSION / MDM / ASSESSMENT AND PLAN / ED COURSE  I reviewed the triage vital signs and the nursing notes.                              78 y.o. female with past medical history of hypertension, hyperlipidemia, diabetes, CAD, and rheumatoid arthritis who presents to the ED complaining of intermittent chest pain for the past 2 weeks with 2 days of nausea, vomiting, and diarrhea.  Patient's presentation is most consistent with acute presentation with potential threat to life or bodily function.  Differential diagnosis includes, but is not limited to, ACS, PE, pneumonia, pneumothorax, musculoskeletal pain, GERD, anxiety, dehydration, electrolyte abnormality, AKI.  Patient nontoxic-appearing and in no acute distress, vital signs are unremarkable.  She denies any active chest pain, EKG shows no evidence of arrhythmia or ischemia and troponin within normal limits, chest x-ray negative for acute process and I doubt PE or dissection.  Labs do show hyponatremia at 126, likely due to recent vomiting and diarrhea.  No significant AKI, anemia, or leukocytosis noted.  We will give IV fluid bolus and treat nausea with IV Phenergan  given she reports Zofran  has made her symptoms worse in the past.  Case discussed with hospitalist for admission.      FINAL CLINICAL IMPRESSION(S) / ED DIAGNOSES   Final diagnoses:  Hyponatremia  Chest pain, unspecified type  Nausea, vomiting, and diarrhea     Rx / DC Orders   ED Discharge Orders     None        Note:  This document was prepared using Dragon voice recognition software and may  include unintentional dictation errors.   Twilla Galea, MD 09/15/23 2322

## 2023-09-15 NOTE — H&P (Signed)
 History and Physical    Elaine Cox HQI:[id number] DOB: 07/22/1945 DOA: 09/15/2023  Referring MD/NP/PA:   PCP: Antonio Baumgarten, MD   Patient coming from:  The patient is coming from home.     Chief Complaint: Nausea, vomiting, diarrhea, chest pain  HPI: Elaine Cox is a 78 y.o. female with medical history significant of HTN, HLD, DM, CAD, rheumatoid arthritis, breast cancer (s/p of chemotherapy), stroke, anxiety, obesity, kidney stone, who presents with nausea, vomiting, diarrhea and chest pain.  Patient states that recently she started taking Januvia a week ago, then developed nausea, vomiting and diarrhea in the past several days.  She has several episode of nonbilious nonbloody vomiting and 4-5 times of watery diarrhea each day.  She has mild diffused cramping abdominal pain.  No fever or chills. Patient has poor appetite and decreased oral intake.  She also reports chest pain, which is located central chest, intermittent, pressure-like, mild to moderate, nonradiating.  No SOB.  Patient states she had upper respiratory symptoms last week which has largely resolved.  No symptoms of UTI.  She has a history of rheumatoid arthritis, reports diffuse joint pain all over today.  Data reviewed independently and ED Course: pt was found to have trop  15 --> 16, WBC 8.1, GFR > 60, sodium 126, temperature 99.1, blood pressure 95/57 --> 118/89, heart rate 79, RR 16, oxygen saturation 97% on room air.  Chest x-ray negative.  Patient is placed in telemetry bed for observation.  EKG: I have personally reviewed.  Sinus rhythm, QTc 425, Q-wave noted in lead III, low voltage in V6.   Review of Systems:   General: no fevers, chills, no body weight gain, has poor appetite, has fatigue HEENT: no blurry vision, hearing changes or sore throat Respiratory: no dyspnea, coughing, wheezing CV: has chest pain, no palpitations GI: has nausea, vomiting, abdominal pain, diarrhea GU: no dysuria, burning  on urination, increased urinary frequency, hematuria  Ext: no leg edema Neuro: no unilateral weakness, numbness, or tingling, no vision change or hearing loss Skin: no rash, no skin tear. MSK: has diffused joint pain Heme: No easy bruising.  Travel history: No recent long distant travel.   Allergy:  Allergies  Allergen Reactions   Doxazosin Other (See Comments)    "headache"   Fish Allergy Anaphylaxis, Itching, Nausea Only and Swelling    She states her tongue turns black.    Dicloxacillin Nausea And Vomiting    Has patient had a PCN reaction causing immediate rash, facial/tongue/throat swelling, SOB or lightheadedness with hypotension: No Has patient had a PCN reaction causing severe rash involving mucus membranes or skin necrosis: No Has patient had a PCN reaction that required hospitalization: Unknown Has patient had a PCN reaction occurring within the last 10 years: Unknown If all of the above answers are "NO", then may proceed with Cephalosporin use.   Iodine Hives    Per patient, she had a previous reaction/hives from an iodine injection   Sulfa Antibiotics Swelling    Loss of appetite   Constipation bloating Other reaction(s): Headache    Cardura [Doxazosin Mesylate]     Headache    Etodolac Itching and Swelling   Sulfamethoxazole-Trimethoprim Nausea Only   Zofran  [Ondansetron ]    Azithromycin Itching   Ciprofloxacin Nausea Only and Rash   Clopidogrel  Nausea And Vomiting   Colchicine Nausea Only and Other (See Comments)    Dizzness   Glucosamine Nausea And Vomiting and Other (See Comments)   Leflunomide Diarrhea  and Itching   Meloxicam Itching   Methotrexate Nausea Only   Zoloft [Sertraline] Nausea And Vomiting    Past Medical History:  Diagnosis Date   Anginal pain (HCC) 06/2016   being followed by dr. Cara Chancellor   Anxiety    Arthritis    Osteoarthritis   Arthritis    Rheumatoid   Breast cancer (HCC) 2000   Right Breast - Chemotherapy   Collagen vascular  disease (HCC)    Rheumatoid Arthritis.   Coronary artery disease    Diabetes mellitus without complication [facility])    Patient takes Metformin    Dyspnea 03/2017   GERD (gastroesophageal reflux disease)    History of abdominal hysterectomy    History of eyelid surgery    History of kidney stones    Hyperlipidemia    Hypertension    Lumbago    Murmur    Myocardial infarction (HCC) 07/2010   Orthopnea 03/2017   Personal history of chemotherapy 2000   right breast ca   Sinus disorder    Stroke [facility]) 2006, 2012   Wears dentures    full upper and lower    Past Surgical History:  Procedure Laterality Date   ABDOMINAL HYSTERECTOMY     BREAST SURGERY Right    mastectomy    CARDIAC CATHETERIZATION  2012   stent placed   CATARACT EXTRACTION W/PHACO Right 06/24/2017   Procedure: CATARACT EXTRACTION PHACO AND INTRAOCULAR LENS PLACEMENT (IOC) COMPLICATED RIGHT DIABETIC;  Surgeon: Annell Kidney, MD;  Location: [facility];  Service: Ophthalmology;  Laterality: Right;  Diabetes - oral med   COLONOSCOPY     COLONOSCOPY WITH PROPOFOL  N/A 12/08/2017   Procedure: COLONOSCOPY WITH PROPOFOL ;  Surgeon: Toledo, Alphonsus Jeans, MD;  Location: [facility];  Service: Gastroenterology;  Laterality: N/A;   CORONARY ANGIOPLASTY     CORONARY STENT INTERVENTION N/A 02/24/2018   Procedure: CORONARY STENT INTERVENTION;  Surgeon: Percival Brace, MD;  Location: [facility];  Service: Cardiovascular;  Laterality: N/A;   CORONARY STENT INTERVENTION N/A 03/25/2023   Procedure: CORONARY STENT INTERVENTION;  Surgeon: Percival Brace, MD;  Location: [facility];  Service: Cardiovascular;  Laterality: N/A;   CYSTOSCOPY N/A 09/08/2016   Procedure: CYSTOSCOPY;  Surgeon: Ward, Margarie Shay, MD;  Location: [facility];  Service: Gynecology;  Laterality: N/A;   EYE SURGERY Left    Cataract Extraction with IOL   KNEE ARTHROPLASTY Right 03/14/2020   Procedure: COMPUTER ASSISTED TOTAL KNEE  ARTHROPLASTY;  Surgeon: Arlyne Lame, MD;  Location: [facility];  Service: Orthopedics;  Laterality: Right;  FOLLOWING 1ST CASE   KNEE ARTHROSCOPY W/ MENISCAL REPAIR Right    LAPAROSCOPIC BILATERAL SALPINGO OOPHERECTOMY Bilateral 09/08/2016   Procedure: LAPAROSCOPIC BILATERAL SALPINGO OOPHORECTOMY;  Surgeon: Ward, Margarie Shay, MD;  Location: [facility];  Service: Gynecology;  Laterality: Bilateral;   LEFT HEART CATH AND CORONARY ANGIOGRAPHY Left 02/24/2018   Procedure: LEFT HEART CATH AND CORONARY ANGIOGRAPHY;  Surgeon: Percival Brace, MD;  Location: [facility];  Service: Cardiovascular;  Laterality: Left;   LEFT HEART CATH AND CORONARY ANGIOGRAPHY Left 03/25/2023   Procedure: LEFT HEART CATH AND CORONARY ANGIOGRAPHY;  Surgeon: Percival Brace, MD;  Location: [facility];  Service: Cardiovascular;  Laterality: Left;   MASTECTOMY Right 2000   BREAST CA    Social History:  reports that she has never smoked. She has never used smokeless tobacco. She reports that she does not drink alcohol  and does not use drugs.  Family History:  Family  History  Problem Relation Age of Onset   Diabetes type II Mother    Hypertension Mother    Colon polyps Mother    Breast cancer Maternal Aunt      Prior to Admission medications   Medication Sig Start Date End Date Taking? Authorizing Provider  ACCU-CHEK GUIDE test strip  12/08/19   [provider]  acetaminophen  (TYLENOL ) 325 MG tablet Take 2 tablets (650 mg total) by mouth every 6 (six) hours as needed for mild pain (or Fever >/= 101). Patient taking differently: Take 1,000 mg by mouth every 6 (six) hours as needed for mild pain (pain score 1-3) (or Fever >/= 101). 03/09/18   Gouru, Aruna, MD  Alcohol  Swabs (B-D SINGLE USE SWABS REGULAR) PADS  12/20/19   [provider]  ALPRAZolam  (XANAX ) 0.25 MG tablet Take 0.25 mg by mouth at bedtime. 03/16/18   [provider]  amLODipine  (NORVASC ) 10 MG tablet Take 10  mg by mouth daily.    [provider]  APPLE CIDER VINEGAR PO Take 1 capsule by mouth daily.    [provider]  aspirin  EC 81 MG EC tablet Take 1 tablet (81 mg total) by mouth daily. Swallow whole. 08/17/20   Sheril Dines, MD  Biotin 5000 MCG TABS Take 10,000 mcg by mouth daily.    [provider]  Blood Glucose Calibration (TRUE METRIX LEVEL 1) Low SOLN  10/24/19   [provider]  clopidogrel  (PLAVIX ) 75 MG tablet Take 1 tablet (75 mg total) by mouth daily with breakfast. 03/26/23   Hudson, Caralyn, PA-C  diclofenac Sodium (VOLTAREN) 1 % GEL Apply 2 g topically daily as needed (pain).    [provider]  Fluocinolone Acetonide 0.01 % OIL Place 3-4 drops in ear(s) at bedtime as needed (Dry eyes).    [provider]  glimepiride  (AMARYL ) 4 MG tablet Take 4 mg by mouth daily with breakfast. 10/14/22   [provider]  isosorbide  mononitrate (IMDUR ) 60 MG 24 hr tablet Take 60 mg by mouth daily. 03/18/23 03/17/24  [provider]  Menthol -Methyl Salicylate (MUSCLE RUB) 10-15 % CREA Apply 1 application topically as needed for muscle pain.    [provider]  metoprolol  succinate (TOPROL -XL) 50 MG 24 hr tablet Take 50 mg by mouth daily. 06/15/19   [provider]  Multiple Vitamins-Minerals (MULTIVITAMIN WITH MINERALS) tablet Take 1 tablet by mouth daily.    [provider]  nitroGLYCERIN  (NITROSTAT ) 0.4 MG SL tablet Place 0.4 mg under the tongue daily as needed for chest pain.    [provider]  omeprazole (PRILOSEC) 20 MG capsule Take 20 mg by mouth 2 (two) times daily before a meal.    [provider]  potassium chloride  (KLOR-CON ) 10 MEQ tablet Take 10 mEq by mouth 4 (four) times daily. 03/18/23   [provider]  predniSONE  (DELTASONE ) 5 MG tablet Take 5 mg by mouth daily with breakfast. 03/10/23   [provider]  Probiotic Product (ALIGN) 4 MG CAPS Take 8 mg by mouth  daily.    [provider]  RESTASIS 0.05 % ophthalmic emulsion Place 1 drop into both eyes daily as needed (Dry eyes).    [provider]  rosuvastatin  (CRESTOR ) 10 MG tablet Take 10 mg by mouth daily.    [provider]    Physical Exam: Vitals:   09/15/23 1958 09/15/23 2151 09/15/23 2200 09/15/23 2349  BP: 132/61 118/89 (!) 145/67   Pulse: 73 78 63  Resp: 16     Temp: 99.1 F (37.3 C)   98.4 F (36.9 C)  TempSrc:    Oral  SpO2: 99% 97% 100%   Weight:      Height:       General: Not in acute distress HEENT:       Eyes: PERRL, EOMI, no jaundice       ENT: No discharge from the ears and nose, no pharynx injection, no tonsillar enlargement.        Neck: No JVD, no bruit, no mass felt. Heme: No neck lymph node enlargement. Cardiac: S1/S2, RRR, No gallops or rubs. Respiratory: No rales, wheezing, rhonchi or rubs. GI: Soft, nondistended, has diffused mild tenderness, no rebound pain, no organomegaly, BS present. GU: No hematuria Ext: No pitting leg edema bilaterally. 1+DP/PT pulse bilaterally. Musculoskeletal: has diffuse joint pain, worse on the left elbow which is not swelling or erythematous. Skin: No rashes.  Neuro: Alert, oriented X3, cranial nerves II-XII grossly intact, moves all extremities normally.  Psych: Patient is not psychotic, no suicidal or hemocidal ideation.  Labs on Admission: I have personally reviewed following labs and imaging studies  CBC: Recent Labs  Lab 09/15/23 1648  WBC 8.0  HGB 13.4  HCT 35.7*  MCV 81.3  PLT 292   Basic Metabolic Panel: Recent Labs  Lab 09/15/23 1648  NA 126*  K 4.1  CL 93*  CO2 23  GLUCOSE 170*  BUN <5*  CREATININE 0.48  CALCIUM  8.8*  MG 1.7  PHOS 2.5   GFR: Estimated Creatinine Clearance: 57.1 mL/min (by C-G formula based on SCr of 0.48 mg/dL). Liver Function Tests: No results for input(s): "AST", "ALT", "ALKPHOS", "BILITOT", "PROT", "ALBUMIN" in the last 168 hours. No results for  input(s): "LIPASE", "AMYLASE" in the last 168 hours. No results for input(s): "AMMONIA" in the last 168 hours. Coagulation Profile: No results for input(s): "INR", "PROTIME" in the last 168 hours. Cardiac Enzymes: No results for input(s): "CKTOTAL", "CKMB", "CKMBINDEX", "TROPONINI" in the last 168 hours. BNP (last 3 results) No results for input(s): "PROBNP" in the last 8760 hours. HbA1C: No results for input(s): "HGBA1C" in the last 72 hours. CBG: No results for input(s): "GLUCAP" in the last 168 hours. Lipid Profile: No results for input(s): "CHOL", "HDL", "LDLCALC", "TRIG", "CHOLHDL", "LDLDIRECT" in the last 72 hours. Thyroid  Function Tests: No results for input(s): "TSH", "T4TOTAL", "FREET4", "T3FREE", "THYROIDAB" in the last 72 hours. Anemia Panel: No results for input(s): "VITAMINB12", "FOLATE", "FERRITIN", "TIBC", "IRON", "RETICCTPCT" in the last 72 hours. Urine analysis:    Component Value Date/Time   COLORURINE AMBER (A) 08/14/2020 1207   APPEARANCEUR Hazy (A) 12/17/2022 1407   LABSPEC 1.013 08/14/2020 1207   PHURINE 5.0 08/14/2020 1207   GLUCOSEU Negative 12/17/2022 1407   HGBUR MODERATE (A) 08/14/2020 1207   BILIRUBINUR Negative 12/17/2022 1407   KETONESUR NEGATIVE 08/14/2020 1207   PROTEINUR Negative 12/17/2022 1407   PROTEINUR 100 (A) 08/14/2020 1207   NITRITE Negative 12/17/2022 1407   NITRITE NEGATIVE 08/14/2020 1207   LEUKOCYTESUR Negative 12/17/2022 1407   LEUKOCYTESUR SMALL (A) 08/14/2020 1207   Sepsis Labs: @LABRCNTIP (procalcitonin:4,lacticidven:4) )No results found for this or any previous visit (from the past 240 hours).   Radiological Exams on Admission:   Assessment/Plan Principal Problem:   Nausea vomiting and diarrhea Active Problems:   CAD (coronary artery disease)   Chest pain   Hyponatremia   HTN (hypertension)   HLD (hyperlipidemia)   Seropositive rheumatoid arthritis (HCC)   Stroke (HCC)  Diabetes mellitus without complication  (HCC)   Anxiety   Obesity (BMI 30-39.9)   Assessment and Plan:  Nausea vomiting and diarrhea: Patient attributes her symptoms due to Januvia use, viral gastroenteritis is also possible, will also need to rule out C. difficile.  -place in tele bed bed for obs - Follow-up C. difficile and GI pathogen panel - IV fluid: 1 L LR in ED, then 75 cc/h of normal saline - As needed Phenergan  for nausea (patient is allergic to Zofran )  CAD (coronary artery disease) and chest pain: Possibly due to demand ischemia.  Initial troponin negative x 2. - Trend troponin - Continue aspirin , Plavix  - Continue Crestor  and Imdur  - As needed nitroglycerin  - As needed morphine , Percocet  Hyponatremia: Na 126.  Mental status normal. - Will check urine sodium, urine osmolality, serum osmolality. - Fluid restriction - IVF: 1L LR in ED, will continue with IV normal saline at 75 mL/h - Sodium chloride  tablet 1 g twice daily - f/u by BMP q8h - avoid over correction too fast due to risk of central pontine myelinolysis  HTN (hypertension) -Hold amlodipine  and metoprolol  due to softer blood pressure - Continue Imdur   HLD (hyperlipidemia) -Crestor   Seropositive rheumatoid arthritis [facility]): patient reports diffuse joint pain.  She is taking prednisone  2.5 mg daily - Give 1 dose of Solu-Cortef 100 mg stress dose - Increase prednisone  dose from 2.5 to 10 mg daily  Stroke (HCC) -Aspirin , Plavix , Crestor   Diabetes mellitus without complication [facility]): Recent A1c 6.6, well-controlled.  Patient is taking Januvia and Amaryl  -Sliding scale insulin   Anxiety -Continue home Xanax   Obesity (BMI 30-39.9): Patient has Obesity Class I, with body weight 78.4 Kg and BMI  31.6kg/m2.  - Encourage losing weight - Exercise and healthy diet         DVT ppx: SQ Lovenox   Code Status: Full code   Family Communication:     not done, no family member is at bed side.     Disposition Plan:  Anticipate discharge back to  previous environment  Consults called:  none  Admission status and Level of care: Telemetry Cardiac:    for obs    Dispo: The patient is from: Home              Anticipated d/c is to: Home              Anticipated d/c date is: 1 day              Patient currently is not medically stable to d/c.    Severity of Illness:  The appropriate patient status for this patient is OBSERVATION. Observation status is judged to be reasonable and necessary in order to provide the required intensity of service to ensure the patient's safety. The patient's presenting symptoms, physical exam findings, and initial radiographic and laboratory data in the context of their medical condition is felt to place them at decreased risk for further clinical deterioration. Furthermore, it is anticipated that the patient will be medically stable for discharge from the hospital within 2 midnights of admission.        Date of Service 09/15/2023    Fidencio Hue [facility]   If 7PM-7AM, please contact night-coverage [email or web address] 09/15/2023, 11:53 PM

## 2023-09-16 DIAGNOSIS — Z6831 Body mass index (BMI) 31.0-31.9, adult: Secondary | ICD-10-CM | POA: Diagnosis not present

## 2023-09-16 DIAGNOSIS — Z8249 Family history of ischemic heart disease and other diseases of the circulatory system: Secondary | ICD-10-CM | POA: Diagnosis not present

## 2023-09-16 DIAGNOSIS — R197 Diarrhea, unspecified: Secondary | ICD-10-CM | POA: Diagnosis present

## 2023-09-16 DIAGNOSIS — E861 Hypovolemia: Secondary | ICD-10-CM | POA: Diagnosis present

## 2023-09-16 DIAGNOSIS — E119 Type 2 diabetes mellitus without complications: Secondary | ICD-10-CM | POA: Diagnosis present

## 2023-09-16 DIAGNOSIS — R079 Chest pain, unspecified: Secondary | ICD-10-CM | POA: Diagnosis present

## 2023-09-16 DIAGNOSIS — Z7982 Long term (current) use of aspirin: Secondary | ICD-10-CM | POA: Diagnosis not present

## 2023-09-16 DIAGNOSIS — I2489 Other forms of acute ischemic heart disease: Secondary | ICD-10-CM | POA: Diagnosis present

## 2023-09-16 DIAGNOSIS — Z7902 Long term (current) use of antithrombotics/antiplatelets: Secondary | ICD-10-CM | POA: Diagnosis not present

## 2023-09-16 DIAGNOSIS — I252 Old myocardial infarction: Secondary | ICD-10-CM | POA: Diagnosis not present

## 2023-09-16 DIAGNOSIS — Z9011 Acquired absence of right breast and nipple: Secondary | ICD-10-CM | POA: Diagnosis not present

## 2023-09-16 DIAGNOSIS — E871 Hypo-osmolality and hyponatremia: Secondary | ICD-10-CM | POA: Diagnosis present

## 2023-09-16 DIAGNOSIS — Z888 Allergy status to other drugs, medicaments and biological substances status: Secondary | ICD-10-CM | POA: Diagnosis not present

## 2023-09-16 DIAGNOSIS — M059 Rheumatoid arthritis with rheumatoid factor, unspecified: Secondary | ICD-10-CM | POA: Diagnosis present

## 2023-09-16 DIAGNOSIS — I2510 Atherosclerotic heart disease of native coronary artery without angina pectoris: Secondary | ICD-10-CM | POA: Diagnosis present

## 2023-09-16 DIAGNOSIS — I10 Essential (primary) hypertension: Secondary | ICD-10-CM | POA: Diagnosis present

## 2023-09-16 DIAGNOSIS — K219 Gastro-esophageal reflux disease without esophagitis: Secondary | ICD-10-CM | POA: Diagnosis present

## 2023-09-16 DIAGNOSIS — Z955 Presence of coronary angioplasty implant and graft: Secondary | ICD-10-CM | POA: Diagnosis not present

## 2023-09-16 DIAGNOSIS — R112 Nausea with vomiting, unspecified: Secondary | ICD-10-CM | POA: Diagnosis present

## 2023-09-16 DIAGNOSIS — Z7984 Long term (current) use of oral hypoglycemic drugs: Secondary | ICD-10-CM | POA: Diagnosis not present

## 2023-09-16 DIAGNOSIS — E785 Hyperlipidemia, unspecified: Secondary | ICD-10-CM | POA: Diagnosis present

## 2023-09-16 DIAGNOSIS — F419 Anxiety disorder, unspecified: Secondary | ICD-10-CM | POA: Diagnosis present

## 2023-09-16 DIAGNOSIS — E66811 Obesity, class 1: Secondary | ICD-10-CM | POA: Diagnosis present

## 2023-09-16 DIAGNOSIS — Z833 Family history of diabetes mellitus: Secondary | ICD-10-CM | POA: Diagnosis not present

## 2023-09-16 LAB — CBC
HCT: 39.1 % (ref 36.0–46.0)
Hemoglobin: 14.6 g/dL (ref 12.0–15.0)
MCH: 30.7 pg (ref 26.0–34.0)
MCHC: 37.3 g/dL — ABNORMAL HIGH (ref 30.0–36.0)
MCV: 82.1 fL (ref 80.0–100.0)
Platelets: 282 10*3/uL (ref 150–400)
RBC: 4.76 MIL/uL (ref 3.87–5.11)
RDW: 14.2 % (ref 11.5–15.5)
WBC: 10.4 10*3/uL (ref 4.0–10.5)
nRBC: 0 % (ref 0.0–0.2)

## 2023-09-16 LAB — BASIC METABOLIC PANEL WITH GFR
Anion gap: 7 (ref 5–15)
Anion gap: 10 (ref 5–15)
Anion gap: 9 (ref 5–15)
BUN: 6 mg/dL — ABNORMAL LOW (ref 8–23)
BUN: 7 mg/dL — ABNORMAL LOW (ref 8–23)
BUN: 7 mg/dL — ABNORMAL LOW (ref 8–23)
CO2: 23 mmol/L (ref 22–32)
CO2: 23 mmol/L (ref 22–32)
CO2: 26 mmol/L (ref 22–32)
Calcium: 8.9 mg/dL (ref 8.9–10.3)
Calcium: 8.7 mg/dL — ABNORMAL LOW (ref 8.9–10.3)
Calcium: 9.5 mg/dL (ref 8.9–10.3)
Chloride: 97 mmol/L — ABNORMAL LOW (ref 98–111)
Chloride: 102 mmol/L (ref 98–111)
Chloride: 102 mmol/L (ref 98–111)
Creatinine, Ser: 0.55 mg/dL (ref 0.44–1.00)
Creatinine, Ser: 0.68 mg/dL (ref 0.44–1.00)
Creatinine, Ser: 0.72 mg/dL (ref 0.44–1.00)
GFR, Estimated: >60 mL/min (ref >60)
GFR, Estimated: >60 mL/min (ref >60)
GFR, Estimated: >60 mL/min (ref >60)
Glucose, Bld: 240 mg/dL — ABNORMAL HIGH (ref 70–99)
Glucose, Bld: 248 mg/dL — ABNORMAL HIGH (ref 70–99)
Glucose, Bld: 182 mg/dL — ABNORMAL HIGH (ref 70–99)
Potassium: 4.4 mmol/L (ref 3.5–5.1)
Potassium: 3.7 mmol/L (ref 3.5–5.1)
Potassium: 4 mmol/L (ref 3.5–5.1)
Sodium: 127 mmol/L — ABNORMAL LOW (ref 135–145)
Sodium: 135 mmol/L (ref 135–145)
Sodium: 137 mmol/L (ref 135–145)

## 2023-09-16 LAB — CBG MONITORING, ED
Glucose-Capillary: 114 mg/dL — ABNORMAL HIGH (ref 70–99)
Glucose-Capillary: 341 mg/dL — ABNORMAL HIGH (ref 70–99)
Glucose-Capillary: 266 mg/dL — ABNORMAL HIGH (ref 70–99)
Glucose-Capillary: 150 mg/dL — ABNORMAL HIGH (ref 70–99)

## 2023-09-16 LAB — HEMOGLOBIN A1C
Hgb A1c MFr Bld: 9 % — ABNORMAL HIGH (ref 4.8–5.6)
Mean Plasma Glucose: 212 mg/dL

## 2023-09-16 LAB — TROPONIN I (HIGH SENSITIVITY)
Troponin I (High Sensitivity): 15 ng/L (ref <18)
Troponin I (High Sensitivity): 18 ng/L — ABNORMAL HIGH (ref <18)

## 2023-09-16 LAB — GLUCOSE, CAPILLARY: Glucose-Capillary: 206 mg/dL — ABNORMAL HIGH (ref 70–99)

## 2023-09-16 LAB — OSMOLALITY: Osmolality: 265 mosm/kg — ABNORMAL LOW (ref 275–295)

## 2023-09-16 MED ORDER — METOPROLOL SUCCINATE ER 50 MG PO TB24
50.0000 mg | ORAL_TABLET | Freq: Every day | ORAL | Status: DC
  Administered 2023-09-16 – 2023-09-17 (×2): 50 mg via ORAL
  Filled 2023-09-16 (×2): qty 1

## 2023-09-16 MED ORDER — PANTOPRAZOLE SODIUM 20 MG PO TBEC
20.0000 mg | DELAYED_RELEASE_TABLET | Freq: Two times a day (BID) | ORAL | Status: DC
  Administered 2023-09-17: 20 mg via ORAL
  Filled 2023-09-16 (×2): qty 1

## 2023-09-16 MED ORDER — TIZANIDINE HCL 4 MG PO TABS
4.0000 mg | ORAL_TABLET | Freq: Three times a day (TID) | ORAL | Status: DC
  Administered 2023-09-16 – 2023-09-17 (×2): 4 mg via ORAL
  Filled 2023-09-16: qty 1
  Filled 2023-09-16: qty 2
  Filled 2023-09-16 (×2): qty 1

## 2023-09-16 MED ORDER — PREDNISONE 10 MG PO TABS
5.0000 mg | ORAL_TABLET | Freq: Every day | ORAL | Status: DC
  Administered 2023-09-17: 5 mg via ORAL
  Filled 2023-09-16: qty 1

## 2023-09-16 NOTE — ED Notes (Signed)
 Lab called to report CBC was clotted. Informed lab pt is a hard stick and can only be stuck in the left arm. Asked lab if they could come try. Lab tech said she would see if she could send someone.

## 2023-09-16 NOTE — Plan of Care (Signed)
  Problem: Nutritional: Goal: Maintenance of adequate nutrition will improve Outcome: Progressing   Problem: Education: Goal: Knowledge of General Education information will improve Description: Including pain rating scale, medication(s)/side effects and non-pharmacologic comfort measures Outcome: Progressing   Problem: Activity: Goal: Risk for activity intolerance will decrease Outcome: Progressing

## 2023-09-16 NOTE — Progress Notes (Signed)
 PROGRESS NOTE    Elaine Cox  GNF:[id number] DOB: September 25, 1945 DOA: 09/15/2023 PCP: Antonio Baumgarten, MD    Brief Narrative:   78 y.o. female with medical history significant of HTN, HLD, DM, CAD, rheumatoid arthritis, breast cancer (s/p of chemotherapy), stroke, anxiety, obesity, kidney stone, who presents with nausea, vomiting, diarrhea and chest pain.   Patient states that recently she started taking Januvia a week ago, then developed nausea, vomiting and diarrhea in the past several days.  She has several episode of nonbilious nonbloody vomiting and 4-5 times of watery diarrhea each day.  She has mild diffused cramping abdominal pain.  No fever or chills. Patient has poor appetite and decreased oral intake.  She also reports chest pain, which is located central chest, intermittent, pressure-like, mild to moderate, nonradiating.  No SOB.  Patient states she had upper respiratory symptoms last week which has largely resolved.  No symptoms of UTI.  She has a history of rheumatoid arthritis, reports diffuse joint pain all over today.   Assessment & Plan:   Principal Problem:   Nausea vomiting and diarrhea Active Problems:   CAD (coronary artery disease)   Chest pain   Hyponatremia   HTN (hypertension)   HLD (hyperlipidemia)   Seropositive rheumatoid arthritis (HCC)   Stroke (HCC)   Diabetes mellitus without complication (HCC)   Anxiety   Obesity (BMI 30-39.9)  Nausea vomiting and diarrhea:  Patient attributes her symptoms due to Januvia use, viral gastroenteritis is also possible, will also need to rule out C. difficile. Plan: Follow-up C. difficile and GI PCR panel Fluids As needed Phenergan    CAD (coronary artery disease) and chest pain:  Suspect supply/demand ischemia.  Troponins flat x 2.  No evidence of NSTEMI. Plan: Continue DAPT Continue Crestor  and Imdur    Hyponatremia:  Initial sodium 126.  Subsequently improved to 127.  Patient appears relatively  euvolemic Plan: Hold salt tabs Check urinalysis osmolality Check urine sodium   HTN (hypertension) -Hold amlodipine  and metoprolol  due to softer blood pressure - Continue Imdur    HLD (hyperlipidemia) -Crestor    Seropositive rheumatoid arthritis [facility]): patient reports diffuse joint pain.  She is taking prednisone  2.5 mg daily - Give 1 dose of Solu-Cortef 100 mg stress dose - Increase prednisone  dose from 2.5 to 10 mg daily   Stroke (HCC) -Aspirin , Plavix , Crestor    Diabetes mellitus without complication (HCC):  Recheck hemoglobin A1c.  Hold oral agents.   Anxiety -Continue home Xanax    Obesity (BMI 30-39.9): Patient has Obesity Class I, with body weight 78.4 Kg and BMI  31.6kg/m2.  - Encourage losing weight - Exercise and healthy diet   DVT prophylaxis: SQ Lovenox  Code Status: Full Family Communication: Daughter at bedside 6/11 Disposition Plan: Status is: Inpatient Remains inpatient appropriate because: Multiple acute issues as above   Level of care: Telemetry Cardiac  Consultants:  None  Procedures:  None  Antimicrobials: None   Subjective: Seen and examined.  Resting in bed.  No visible stress.  No complaints of pain.  Objective: Vitals:   09/16/23 0451 09/16/23 0500 09/16/23 1006 09/16/23 1432  BP:  (!) 133/57 (!) 150/76 (!) 137/57  Pulse:  75 90 90  Resp:  18 20 (!) 27  Temp: 98.1 F (36.7 C)  98 F (36.7 C) 98.2 F (36.8 C)  TempSrc: Oral  Oral Oral  SpO2:  95% 96% 97%  Weight:      Height:       No intake or output data in the  24 hours ending 09/16/23 1614 Filed Weights   09/15/23 1647  Weight: 78.4 kg    Examination:  General exam: Appears calm and comfortable  Respiratory system: Lungs clear.  Work of breathing.  Room air Cardiovascular system: S1-2, RRR, no murmurs, no pedal edema Gastrointestinal system: Soft, NT ND, normal bowel sounds Central nervous system: Alert and oriented. No focal neurological deficits. Extremities:  Symmetric 5 x 5 power. Skin: No rashes, lesions or ulcers Psychiatry: Judgement and insight appear normal. Mood & affect appropriate.     Data Reviewed: I have personally reviewed following labs and imaging studies  CBC: Recent Labs  Lab 09/15/23 1648 09/16/23 0553  WBC 8.0 10.4  HGB 13.4 14.6  HCT 35.7* 39.1  MCV 81.3 82.1  PLT 292 282   Basic Metabolic Panel: Recent Labs  Lab 09/15/23 1648 09/16/23 0410  NA 126* 127*  K 4.1 4.4  CL 93* 97*  CO2 23 23  GLUCOSE 170* 240*  BUN <5* 6*  CREATININE 0.48 0.55  CALCIUM  8.8* 8.9  MG 1.7  --   PHOS 2.5  --    GFR: Estimated Creatinine Clearance: 57.1 mL/min (by C-G formula based on SCr of 0.55 mg/dL). Liver Function Tests: No results for input(s): AST, ALT, ALKPHOS, BILITOT, PROT, ALBUMIN in the last 168 hours. No results for input(s): LIPASE, AMYLASE in the last 168 hours. No results for input(s): AMMONIA in the last 168 hours. Coagulation Profile: No results for input(s): INR, PROTIME in the last 168 hours. Cardiac Enzymes: No results for input(s): CKTOTAL, CKMB, CKMBINDEX, TROPONINI in the last 168 hours. BNP (last 3 results) No results for input(s): PROBNP in the last 8760 hours. HbA1C: No results for input(s): HGBA1C in the last 72 hours. CBG: Recent Labs  Lab 09/16/23 0116 09/16/23 0921 09/16/23 1135  GLUCAP 114* 341* 266*   Lipid Profile: No results for input(s): CHOL, HDL, LDLCALC, TRIG, CHOLHDL, LDLDIRECT in the last 72 hours. Thyroid  Function Tests: No results for input(s): TSH, T4TOTAL, FREET4, T3FREE, THYROIDAB in the last 72 hours. Anemia Panel: No results for input(s): VITAMINB12, FOLATE, FERRITIN, TIBC, IRON, RETICCTPCT in the last 72 hours. Sepsis Labs: No results for input(s): PROCALCITON, LATICACIDVEN in the last 168 hours.  No results found for this or any previous visit (from the past 240 hours).       Radiology  Studies: DG Chest 2 View Result Date: 09/15/2023 CLINICAL DATA:  Chest pain EXAM: CHEST - 2 VIEW COMPARISON:  Aug 14, 2020 FINDINGS: The heart size and mediastinal contours are within normal limits. Both lungs are clear. The visualized skeletal structures are unremarkable. IMPRESSION: No active cardiopulmonary disease. Electronically Signed   By: Fredrich Jefferson M.D.   On: 09/15/2023 17:14        Scheduled Meds:  acidophilus  1 capsule Oral Daily   ALPRAZolam   0.25 mg Oral QHS   aspirin  EC  81 mg Oral Daily   clopidogrel   75 mg Oral Q breakfast   enoxaparin  (LOVENOX ) injection  40 mg Subcutaneous Q24H   insulin  aspart  0-5 Units Subcutaneous QHS   insulin  aspart  0-9 Units Subcutaneous TID WC   pantoprazole   20 mg Oral BID   [START ON 09/17/2023] predniSONE   5 mg Oral Q breakfast   rosuvastatin   10 mg Oral Daily   sodium chloride   1 g Oral BID WC   Continuous Infusions:  sodium chloride  75 mL/hr at 09/16/23 0112   promethazine  (PHENERGAN ) injection (IM or IVPB) Stopped (09/16/23 0105)  LOS: 0 days    Tiajuana Fluke, MD [facility]   If 7PM-7AM, please contact night-coverage  09/16/2023, 4:14 PM

## 2023-09-16 NOTE — ED Notes (Signed)
 Pt assisted to bedside commode, room cleaned, bedpan emptied, pt needs assessed. New brief and clean chux applied.

## 2023-09-17 ENCOUNTER — Other Ambulatory Visit: Payer: Self-pay

## 2023-09-17 DIAGNOSIS — R112 Nausea with vomiting, unspecified: Secondary | ICD-10-CM | POA: Diagnosis not present

## 2023-09-17 DIAGNOSIS — R197 Diarrhea, unspecified: Secondary | ICD-10-CM | POA: Diagnosis not present

## 2023-09-17 LAB — BASIC METABOLIC PANEL WITH GFR
Anion gap: 8 (ref 5–15)
BUN: 12 mg/dL (ref 8–23)
CO2: 25 mmol/L (ref 22–32)
Calcium: 8.8 mg/dL — ABNORMAL LOW (ref 8.9–10.3)
Chloride: 101 mmol/L (ref 98–111)
Creatinine, Ser: 0.61 mg/dL (ref 0.44–1.00)
GFR, Estimated: >60 mL/min (ref >60)
Glucose, Bld: 212 mg/dL — ABNORMAL HIGH (ref 70–99)
Potassium: 3.8 mmol/L (ref 3.5–5.1)
Sodium: 134 mmol/L — ABNORMAL LOW (ref 135–145)

## 2023-09-17 LAB — GLUCOSE, CAPILLARY
Glucose-Capillary: 167 mg/dL — ABNORMAL HIGH (ref 70–99)
Glucose-Capillary: 236 mg/dL — ABNORMAL HIGH (ref 70–99)

## 2023-09-17 MED ORDER — PROMETHAZINE HCL 12.5 MG PO TABS
12.5000 mg | ORAL_TABLET | Freq: Four times a day (QID) | ORAL | 0 refills | Status: DC | PRN
  Filled 2023-09-17: qty 30, 8d supply, fill #0

## 2023-09-17 NOTE — Evaluation (Signed)
 Physical Therapy Evaluation Patient Details Name: Elaine Cox MRN: [id number] DOB: 04/25/1945 Today's Date: 09/17/2023  History of Present Illness  78 y.o. female with medical history significant of HTN, HLD, DM, CAD, rheumatoid arthritis, breast cancer (s/p of chemotherapy), stroke, anxiety, obesity, kidney stone, who presents with nausea, vomiting, diarrhea and chest pain.  Clinical Impression  Patient received in bed, she has some chronic joint pain due to RA. Reports no longer having nausea/diarrhea. She required min/mod A to raise up to sitting on edge of bed. She reports he daughter usually helps her get up in the morning. Patient is able to stand with min A and ambulated 30 feet with RW and supervision. She is limited by fatigue and pain. Patient will continue to benefit from skilled PT to improve independence with mobility and safety.            If plan is discharge home, recommend the following: A little help with walking and/or transfers;A little help with bathing/dressing/bathroom;Assist for transportation;Help with stairs or ramp for entrance   Can travel by private vehicle    yes    Equipment Recommendations None recommended by PT  Recommendations for Other Services       Functional Status Assessment Patient has had a recent decline in their functional status and demonstrates the ability to make significant improvements in function in a reasonable and predictable amount of time.     Precautions / Restrictions Precautions Precautions: Fall Recall of Precautions/Restrictions: Intact Restrictions Weight Bearing Restrictions Per Provider Order: No      Mobility  Bed Mobility Overal bed mobility: Needs Assistance Bed Mobility: Supine to Sit     Supine to sit: Min assist, Mod assist, Used rails     General bed mobility comments: min.mod A to raise trunk, she reports daughter helps to get her up and going in the am and then patient is in recliner     Transfers Overall transfer level: Needs assistance Equipment used: Rolling walker (2 wheels) Transfers: Sit to/from Stand Sit to Stand: Min assist                Ambulation/Gait Ambulation/Gait assistance: Contact guard assist Gait Distance (Feet): 30 Feet Assistive device: Rolling walker (2 wheels) Gait Pattern/deviations: Step-through pattern, Decreased step length - right, Decreased step length - left, Decreased stride length Gait velocity: decr     General Gait Details: patient ambulated to bathroom and back to recliner. Cga with RW. No lob  Stairs            Wheelchair Mobility     Tilt Bed    Modified Rankin (Stroke Patients Only)       Balance Overall balance assessment: Modified Independent                                           Pertinent Vitals/Pain Pain Assessment Pain Assessment: Faces Faces Pain Scale: Hurts little more Pain Location: elbow, generalized joint pain due to RA Pain Descriptors / Indicators: Discomfort, Grimacing Pain Intervention(s): Monitored during session    Home Living Family/patient expects to be discharged to:: Private residence Living Arrangements: Children Available Help at Discharge: Family;Available PRN/intermittently Type of Home: House Home Access: Stairs to enter   Entrance Stairs-Number of Steps: 1   Home Layout: One level Home Equipment: Agricultural consultant (2 wheels);Jeananne Mighty - single point      Prior Function Prior  Level of Function : Independent/Modified Independent             Mobility Comments: patient using cane prior to admission. Has a walker to use as needed. ADLs Comments: mod I, daughter lives with her, but works     Extremity/Trunk Assessment   Upper Extremity Assessment Upper Extremity Assessment: Overall WFL for tasks assessed    Lower Extremity Assessment Lower Extremity Assessment: Overall WFL for tasks assessed    Cervical / Trunk Assessment Cervical /  Trunk Assessment: Normal  Communication   Communication Communication: No apparent difficulties    Cognition Arousal: Alert Behavior During Therapy: WFL for tasks assessed/performed   PT - Cognitive impairments: No apparent impairments                         Following commands: Intact       Cueing Cueing Techniques: Verbal cues     General Comments      Exercises     Assessment/Plan    PT Assessment Patient needs continued PT services  PT Problem List Decreased activity tolerance;Decreased mobility;Decreased strength;Pain;Decreased knowledge of use of DME       PT Treatment Interventions DME instruction;Gait training;Functional mobility training;Therapeutic activities;Therapeutic exercise;Neuromuscular re-education;Patient/family education    PT Goals (Current goals can be found in the Care Plan section)  Acute Rehab PT Goals Patient Stated Goal: return home with HHPT PT Goal Formulation: With patient Time For Goal Achievement: 09/19/23 Potential to Achieve Goals: Good    Frequency Min 2X/week     Co-evaluation               AM-PAC PT 6 Clicks Mobility  Outcome Measure Help needed turning from your back to your side while in a flat bed without using bedrails?: A Little Help needed moving from lying on your back to sitting on the side of a flat bed without using bedrails?: A Little Help needed moving to and from a bed to a chair (including a wheelchair)?: A Little Help needed standing up from a chair using your arms (e.g., wheelchair or bedside chair)?: A Little Help needed to walk in hospital room?: A Little Help needed climbing 3-5 steps with a railing? : A Little 6 Click Score: 18    End of Session   Activity Tolerance: Patient limited by pain;Patient limited by fatigue Patient left: in chair;with call bell/phone within reach;with chair alarm set Nurse Communication: Mobility status PT Visit Diagnosis: Other abnormalities of gait and  mobility (R26.89);Muscle weakness (generalized) (M62.81);Difficulty in walking, not elsewhere classified (R26.2);Pain Pain - part of body:  (generalized ( RA))    Time: 0932-3557 PT Time Calculation (min) (ACUTE ONLY): 16 min   Charges:   PT Evaluation $PT Eval Moderate Complexity: 1 Mod   PT General Charges $$ ACUTE PT VISIT: 1 Visit         Aariyah Sampey, PT, GCS 09/17/23,11:33 AM

## 2023-09-17 NOTE — Progress Notes (Signed)
 Transition of Care [facility]) - Inpatient Brief Assessment   Patient Details  Name: Elaine Cox MRN: [id number] Date of Birth: 1945-12-16  Transition of Care [facility] CM/SW Contact:    Lakishia Bourassa C Erin Uecker, RN Phone Number: 09/17/2023, 12:47 PM   Clinical Narrative: TOC continuing to follow patient's progress throughout discharge planning.   Transition of Care Asessment: Insurance and Status: Insurance coverage has been reviewed Patient has primary care physician: Yes   Prior level of function:: Independent Prior/Current Home Services: No current home services Social Drivers of Health Review: SDOH reviewed no interventions necessary Readmission risk has been reviewed: Yes Transition of care needs: no transition of care needs at this time

## 2023-09-17 NOTE — Discharge Summary (Signed)
 Physician Discharge Summary  Elaine Cox:[id number] DOB: 1945-09-07 DOA: 09/15/2023  PCP: Antonio Baumgarten, MD  Admit date: 09/15/2023 Discharge date: 09/17/2023  Admitted From: Home Disposition:  Home w home health  Recommendations for Outpatient Follow-up:  Follow up with PCP in 1-2 weeks   Home Health:Yes PT OT  Equipment/Devices:None   Discharge Condition:Stable  CODE STATUS:FULL  Diet recommendation: Reg  Brief/Interim Summary:   78 y.o. female with medical history significant of HTN, HLD, DM, CAD, rheumatoid arthritis, breast cancer (s/p of chemotherapy), stroke, anxiety, obesity, kidney stone, who presents with nausea, vomiting, diarrhea and chest pain.   Patient states that recently she started taking Januvia a week ago, then developed nausea, vomiting and diarrhea in the past several days.  She has several episode of nonbilious nonbloody vomiting and 4-5 times of watery diarrhea each day.  She has mild diffused cramping abdominal pain.  No fever or chills. Patient has poor appetite and decreased oral intake.  She also reports chest pain, which is located central chest, intermittent, pressure-like, mild to moderate, nonradiating.  No SOB.  Patient states she had upper respiratory symptoms last week which has largely resolved.  No symptoms of UTI.  She has a history of rheumatoid arthritis, reports diffuse joint pain all over today.      Discharge Diagnoses:  Principal Problem:   Nausea vomiting and diarrhea Active Problems:   CAD (coronary artery disease)   Chest pain   Hyponatremia   HTN (hypertension)   HLD (hyperlipidemia)   Seropositive rheumatoid arthritis (HCC)   Stroke (HCC)   Diabetes mellitus without complication (HCC)   Anxiety   Obesity (BMI 30-39.9)    Nausea vomiting and diarrhea:  Very low suspicion for infectious diarrhea.  Patient symptoms of nausea vomiting and diarrhea resolved essentially prior to admission.  C. difficile ruled out.  As  needed Phenergan  prescribed on discharge.  CAD (coronary artery disease) and chest pain:  Suspect supply/demand ischemia.  Troponins flat x 2.  No evidence of NSTEMI. Plan: Continue DAPT Continue Crestor  and Imdur    Hyponatremia:  Initial sodium 126.  Subsequently improved to 127.  Patient appears relatively euvolemic on my evaluation.  Sodium improved to reference range at time of discharge.  Suspect hypovolemic hyponatremia.  HTN (hypertension) CAD resume home regimen on discharge   HLD (hyperlipidemia) -Crestor    Seropositive rheumatoid arthritis [facility]): patient reports diffuse joint pain.  She is taking prednisone  5 mg daily.  Can resume on discharge     Discharge Instructions  Discharge Instructions     Diet - low sodium heart healthy   Complete by: As directed    Increase activity slowly   Complete by: As directed       Allergies as of 09/17/2023       Reactions   Doxazosin Other (See Comments)   headache   Fish Allergy Anaphylaxis, Itching, Nausea Only, Swelling   She states her tongue turns black.   Dicloxacillin Nausea And Vomiting   Has patient had a PCN reaction causing immediate rash, facial/tongue/throat swelling, SOB or lightheadedness with hypotension: No Has patient had a PCN reaction causing severe rash involving mucus membranes or skin necrosis: No Has patient had a PCN reaction that required hospitalization: Unknown Has patient had a PCN reaction occurring within the last 10 years: Unknown If all of the above answers are NO, then may proceed with Cephalosporin use.   Iodine Hives   Per patient, she had a previous reaction/hives from an iodine injection  Sulfa Antibiotics Swelling   Loss of appetite   Constipation bloating Other reaction(s): Headache   Cardura [doxazosin Mesylate]    Headache    Etodolac Itching, Swelling   Sulfamethoxazole-trimethoprim Nausea Only   Zofran  [ondansetron ]    Azithromycin Itching   Ciprofloxacin Nausea Only,  Rash   Clopidogrel  Nausea And Vomiting   Colchicine Nausea Only, Other (See Comments)   Dizzness   Glucosamine Nausea And Vomiting, Other (See Comments)   Leflunomide Diarrhea, Itching   Meloxicam Itching   Methotrexate Nausea Only   Zoloft [sertraline] Nausea And Vomiting        Medication List     STOP taking these medications    Fluocinolone Acetonide 0.01 % Oil   isosorbide  mononitrate 60 MG 24 hr tablet Commonly known as: IMDUR    Januvia 100 MG tablet Generic drug: sitaGLIPtin   nitroGLYCERIN  0.4 MG SL tablet Commonly known as: NITROSTAT    omeprazole 20 MG capsule Commonly known as: PRILOSEC       TAKE these medications    Accu-Chek Guide test strip Generic drug: glucose blood   acetaminophen  325 MG tablet Commonly known as: TYLENOL  Take 2 tablets (650 mg total) by mouth every 6 (six) hours as needed for mild pain (or Fever >/= 101). What changed: how much to take   acetaminophen -codeine 300-30 MG tablet Commonly known as: TYLENOL  #3 Take 1 tablet by mouth 2 (two) times daily as needed. FOR PAIN UP TO 10 DAYS   Align 4 MG Caps Take 8 mg by mouth daily.   ALPRAZolam  0.25 MG tablet Commonly known as: XANAX  Take 0.25 mg by mouth at bedtime.   amLODipine  10 MG tablet Commonly known as: NORVASC  Take 10 mg by mouth daily.   APPLE CIDER VINEGAR PO Take 1 capsule by mouth daily.   aspirin  EC 81 MG tablet Take 1 tablet (81 mg total) by mouth daily. Swallow whole.   B-D SINGLE USE SWABS REGULAR Pads   Biotin 5000 MCG Tabs Take 10,000 mcg by mouth daily.   clopidogrel  75 MG tablet Commonly known as: PLAVIX  Take 1 tablet (75 mg total) by mouth daily with breakfast.   D 1000 25 MCG (1000 UT) capsule Generic drug: Cholecalciferol Take 1,000 Units by mouth daily.   glimepiride  4 MG tablet Commonly known as: AMARYL  Take 4 mg by mouth daily with breakfast.   metoprolol  succinate 50 MG 24 hr tablet Commonly known as: TOPROL -XL Take 50 mg by  mouth daily.   multivitamin with minerals tablet Take 1 tablet by mouth daily.   Muscle Rub 10-15 % Crea Apply 1 application topically as needed for muscle pain.   nitrofurantoin (macrocrystal-monohydrate) 100 MG capsule Commonly known as: MACROBID Take 100 mg by mouth 2 (two) times daily. For 5 days   pantoprazole  20 MG tablet Commonly known as: PROTONIX  Take 1 tablet (20 mg total) by mouth 2 (two) times daily before meals Take 30 minutes before meals and other medications.   Potassium Chloride  ER 20 MEQ Tbcr Take 1 tablet by mouth 2 (two) times daily. What changed: Another medication with the same name was removed. Continue taking this medication, and follow the directions you see here.   predniSONE  5 MG tablet Commonly known as: DELTASONE  Take 5 mg by mouth daily with breakfast.   Restasis 0.05 % ophthalmic emulsion Generic drug: cycloSPORINE Place 1 drop into both eyes daily as needed (Dry eyes).   rosuvastatin  10 MG tablet Commonly known as: CRESTOR  Take 10 mg by mouth daily.  tiZANidine 4 MG tablet Commonly known as: ZANAFLEX Take 4 mg by mouth 3 (three) times daily.   True Metrix Level 1 Low Soln   Voltaren 1 % Gel Generic drug: diclofenac Sodium Apply 2 g topically daily as needed (pain).        Allergies  Allergen Reactions   Doxazosin Other (See Comments)    headache   Fish Allergy Anaphylaxis, Itching, Nausea Only and Swelling    She states her tongue turns black.    Dicloxacillin Nausea And Vomiting    Has patient had a PCN reaction causing immediate rash, facial/tongue/throat swelling, SOB or lightheadedness with hypotension: No Has patient had a PCN reaction causing severe rash involving mucus membranes or skin necrosis: No Has patient had a PCN reaction that required hospitalization: Unknown Has patient had a PCN reaction occurring within the last 10 years: Unknown If all of the above answers are NO, then may proceed with Cephalosporin  use.   Iodine Hives    Per patient, she had a previous reaction/hives from an iodine injection   Sulfa Antibiotics Swelling    Loss of appetite   Constipation bloating Other reaction(s): Headache    Cardura [Doxazosin Mesylate]     Headache    Etodolac Itching and Swelling   Sulfamethoxazole-Trimethoprim Nausea Only   Zofran  [Ondansetron ]    Azithromycin Itching   Ciprofloxacin Nausea Only and Rash   Clopidogrel  Nausea And Vomiting   Colchicine Nausea Only and Other (See Comments)    Dizzness   Glucosamine Nausea And Vomiting and Other (See Comments)   Leflunomide Diarrhea and Itching   Meloxicam Itching   Methotrexate Nausea Only   Zoloft [Sertraline] Nausea And Vomiting    Consultations: None   Procedures/Studies: DG Chest 2 View Result Date: 09/15/2023 CLINICAL DATA:  Chest pain EXAM: CHEST - 2 VIEW COMPARISON:  Aug 14, 2020 FINDINGS: The heart size and mediastinal contours are within normal limits. Both lungs are clear. The visualized skeletal structures are unremarkable. IMPRESSION: No active cardiopulmonary disease. Electronically Signed   By: Fredrich Jefferson M.D.   On: 09/15/2023 17:14      Subjective: Seen and examined on the day of discharge.  Stable no distress.  Appropriate discharge home.  Discharge Exam: Vitals:   09/17/23 0812 09/17/23 1119  BP: (!) 175/71 123/79  Pulse: 70 83  Resp:  18  Temp: 98.1 F (36.7 C) 99.1 F (37.3 C)  SpO2: 97% 98%   Vitals:   09/16/23 2356 09/17/23 0347 09/17/23 0812 09/17/23 1119  BP: 116/66 (!) 142/72 (!) 175/71 123/79  Pulse: 64 61 70 83  Resp: 16 18  18   Temp: 98.1 F (36.7 C) (!) 97.5 F (36.4 C) 98.1 F (36.7 C) 99.1 F (37.3 C)  TempSrc: Oral Oral  Oral  SpO2: 99% 97% 97% 98%  Weight:      Height:        General: Pt is alert, awake, not in acute distress Cardiovascular: RRR, S1/S2 +, no rubs, no gallops Respiratory: CTA bilaterally, no wheezing, no rhonchi Abdominal: Soft, NT, ND, bowel sounds  + Extremities: no edema, no cyanosis    The results of significant diagnostics from this hospitalization (including imaging, microbiology, ancillary and laboratory) are listed below for reference.     Microbiology: No results found for this or any previous visit (from the past 240 hours).   Labs: BNP (last 3 results) Recent Labs    02/27/23 1147 04/02/23 1152  BNP 65.7 109.0*   Basic Metabolic  Panel: Recent Labs  Lab 09/15/23 1648 09/16/23 0410 09/16/23 1753 09/16/23 2029 09/17/23 0411  NA 126* 127* 135 137 134*  K 4.1 4.4 3.7 4.0 3.8  CL 93* 97* 102 102 101  CO2 23 23 23 26 25   GLUCOSE 170* 240* 248* 182* 212*  BUN <5* 6* 7* 7* 12  CREATININE 0.48 0.55 0.68 0.72 0.61  CALCIUM  8.8* 8.9 8.7* 9.5 8.8*  MG 1.7  --   --   --   --   PHOS 2.5  --   --   --   --    Liver Function Tests: No results for input(s): AST, ALT, ALKPHOS, BILITOT, PROT, ALBUMIN in the last 168 hours. No results for input(s): LIPASE, AMYLASE in the last 168 hours. No results for input(s): AMMONIA in the last 168 hours. CBC: Recent Labs  Lab 09/15/23 1648 09/16/23 0553  WBC 8.0 10.4  HGB 13.4 14.6  HCT 35.7* 39.1  MCV 81.3 82.1  PLT 292 282   Cardiac Enzymes: No results for input(s): CKTOTAL, CKMB, CKMBINDEX, TROPONINI in the last 168 hours. BNP: Invalid input(s): POCBNP CBG: Recent Labs  Lab 09/16/23 1135 09/16/23 1645 09/16/23 2106 09/17/23 0813 09/17/23 1118  GLUCAP 266* 150* 206* 167* 236*   D-Dimer No results for input(s): DDIMER in the last 72 hours. Hgb A1c Recent Labs    09/16/23 0552  HGBA1C 9.0*   Lipid Profile No results for input(s): CHOL, HDL, LDLCALC, TRIG, CHOLHDL, LDLDIRECT in the last 72 hours. Thyroid  function studies No results for input(s): TSH, T4TOTAL, T3FREE, THYROIDAB in the last 72 hours.  Invalid input(s): FREET3 Anemia work up No results for input(s): VITAMINB12, FOLATE, FERRITIN,  TIBC, IRON, RETICCTPCT in the last 72 hours. Urinalysis    Component Value Date/Time   COLORURINE AMBER (A) 08/14/2020 1207   APPEARANCEUR Hazy (A) 12/17/2022 1407   LABSPEC 1.013 08/14/2020 1207   PHURINE 5.0 08/14/2020 1207   GLUCOSEU Negative 12/17/2022 1407   HGBUR MODERATE (A) 08/14/2020 1207   BILIRUBINUR Negative 12/17/2022 1407   KETONESUR NEGATIVE 08/14/2020 1207   PROTEINUR Negative 12/17/2022 1407   PROTEINUR 100 (A) 08/14/2020 1207   NITRITE Negative 12/17/2022 1407   NITRITE NEGATIVE 08/14/2020 1207   LEUKOCYTESUR Negative 12/17/2022 1407   LEUKOCYTESUR SMALL (A) 08/14/2020 1207   Sepsis Labs Recent Labs  Lab 09/15/23 1648 09/16/23 0553  WBC 8.0 10.4   Microbiology No results found for this or any previous visit (from the past 240 hours).   Time coordinating discharge: Over 30 minutes  SIGNED:   Tiajuana Fluke, MD  [facility] 09/17/2023, 1:33 PM Pager   If 7PM-7AM, please contact night-coverage

## 2023-09-17 NOTE — TOC Progression Note (Signed)
 Transition of Care [facility]) - Progression Note    Patient Details  Name: Elaine Cox MRN: [id number] Date of Birth: 17-Dec-1945  Transition of Care [facility]) CM/SW Contact  Baird Bombard, RN Phone Number: 09/17/2023, 2:39 PM  Clinical Narrative:    Spoke with patient regarding therapy's recommendation for [facility]. She is agreeable and does not have a choice of an agency. She was advised a referral for [facility] would be sent and the accepting agency will contact her to schedule her SOC with in 48 hours.   Referral sent to Shaun from [facility] for PT/ OT.   TOC signing off.          Expected Discharge Plan and Services         Expected Discharge Date: 09/17/23                                     Social Determinants of Health (SDOH) Interventions SDOH Screenings   Food Insecurity: No Food Insecurity (09/07/2023)   Received from [facility]  Housing: Low Risk  (09/14/2023)   Received from [facility]  Transportation Needs: No Transportation Needs (09/07/2023)   Received from [facility]  Utilities: Not At Risk (09/07/2023)   Received from [facility]  Depression [phone]): Medium Risk (04/12/2018)  Financial Resource Strain: Low Risk  (09/07/2023)   Received from [facility]  Tobacco Use: Low Risk  (09/15/2023)    Readmission Risk Interventions     No data to display

## 2023-09-17 NOTE — Evaluation (Signed)
 Occupational Therapy Evaluation Patient Details Name: Elaine Cox MRN: [id number] DOB: [birth year] Today's Date: 09/17/2023   History of Present Illness   78 y.o. female with medical history significant of HTN, HLD, DM, CAD, rheumatoid arthritis, breast cancer (s/p of chemotherapy), stroke, anxiety, obesity, kidney stone, who presents with nausea, vomiting, diarrhea and chest pain.     Clinical Impressions Patient presenting with decreased Ind in self care,balance, functional mobility/transfers, endurance, and safety awareness.Patient reports living at home with use of [facility] for mobility and recently using RW. She lives with daughter who works outside of the home but assists with IADLs and transportation. Pt endorses fatigue but stands from recliner chair with min A and ambulates in room and back to bed with min A . Patient will benefit from acute OT to increase overall independence in the areas of ADLs, functional mobility, and safety awareness in order to safely discharge.     If plan is discharge home, recommend the following:   A little help with walking and/or transfers     Functional Status Assessment   Patient has had a recent decline in their functional status and demonstrates the ability to make significant improvements in function in a reasonable and predictable amount of time.     Equipment Recommendations   BSC/3in1      Precautions/Restrictions   Precautions Precautions: Fall Recall of Precautions/Restrictions: Intact     Mobility Bed Mobility Overal bed mobility: Needs Assistance Bed Mobility: Sit to Supine       Sit to supine: Min assist        Transfers Overall transfer level: Needs assistance Equipment used: Rolling walker (2 wheels) Transfers: Sit to/from Stand Sit to Stand: Min assist                  Balance Overall balance assessment: Needs assistance Sitting-balance support: No upper extremity supported, Feet  supported Sitting balance-Leahy Scale: Good     Standing balance support: During functional activity, Bilateral upper extremity supported, Reliant on assistive device for balance Standing balance-Leahy Scale: Good                             ADL either performed or assessed with clinical judgement   ADL Overall ADL's : Needs assistance/impaired                         Toilet Transfer: Minimal assistance;Rolling walker (2 wheels) Toilet Transfer Details (indicate cue type and reason): simulated transfer                 Vision Patient Visual Report: No change from baseline              Pertinent Vitals/Pain Pain Assessment Pain Assessment: Faces Faces Pain Scale: Hurts little more Pain Location: elbow, generalized joint pain due to RA Pain Descriptors / Indicators: Discomfort, Grimacing Pain Intervention(s): Limited activity within patient's tolerance, Monitored during session, Repositioned     Extremity/Trunk Assessment Upper Extremity Assessment Upper Extremity Assessment: Overall WFL for tasks assessed   Lower Extremity Assessment Lower Extremity Assessment: Overall WFL for tasks assessed   Cervical / Trunk Assessment Cervical / Trunk Assessment: Normal   Communication Communication Communication: No apparent difficulties   Cognition Arousal: Alert Behavior During Therapy: WFL for tasks assessed/performed Cognition: No apparent impairments  Following commands: Intact       Cueing  General Comments   Cueing Techniques: Verbal cues              Home Living Family/patient expects to be discharged to:: Private residence Living Arrangements: Children Available Help at Discharge: Family;Available PRN/intermittently Type of Home: House Home Access: Stairs to enter Entrance Stairs-Number of Steps: 1 Entrance Stairs-Rails: None Home Layout: One level     Bathroom Shower/Tub:  Walk-in shower         Home Equipment: Agricultural consultant (2 wheels);Cane - single point          Prior Functioning/Environment Prior Level of Function : Independent/Modified Independent             Mobility Comments: patient using cane prior to admission. Has a walker to use as needed. ADLs Comments: mod I, daughter lives with her, but works. Daughter performs all IADLs and assists with taking pt to appointments as needed    OT Problem List: Decreased strength;Decreased safety awareness;Decreased activity tolerance;Decreased knowledge of use of DME or AE;Impaired balance (sitting and/or standing)   OT Treatment/Interventions: Self-care/ADL training;Therapeutic activities;Therapeutic exercise;Balance training;Patient/family education      OT Goals(Current goals can be found in the care plan section)   Acute Rehab OT Goals Patient Stated Goal: to go home OT Goal Formulation: With patient Time For Goal Achievement: 10/01/23 Potential to Achieve Goals: Fair ADL Goals Pt Will Perform Grooming: with modified independence Pt Will Perform Lower Body Dressing: with modified independence Pt Will Transfer to Toilet: with modified independence Pt Will Perform Toileting - Clothing Manipulation and hygiene: with modified independence   OT Frequency:  Min 2X/week       AM-PAC OT 6 Clicks Daily Activity     Outcome Measure Help from another person eating meals?: None Help from another person taking care of personal grooming?: None Help from another person toileting, which includes using toliet, bedpan, or urinal?: A Little Help from another person bathing (including washing, rinsing, drying)?: A Little Help from another person to put on and taking off regular upper body clothing?: None Help from another person to put on and taking off regular lower body clothing?: A Little 6 Click Score: 21   End of Session Equipment Utilized During Treatment: Rolling walker (2 wheels) Nurse  Communication: Mobility status  Activity Tolerance: Patient tolerated treatment well Patient left: in bed;with call bell/phone within reach;with bed alarm set  OT Visit Diagnosis: Unsteadiness on feet (R26.81);Muscle weakness (generalized) (M62.81)                Time: 1610-9604 OT Time Calculation (min): 17 min Charges:  OT General Charges $OT Visit: 1 Visit OT Evaluation $OT Eval Low Complexity: 1 Low  George Kinder, MS, OTR/L , CBIS ascom [phone]  09/17/23, 1:39 PM

## 2023-09-21 ENCOUNTER — Emergency Department
Admission: EM | Admit: 2023-09-21 | Discharge: 2023-09-21 | Disposition: A | Discharge status: Home / Self Care | Attending: Emergency Medicine | Admitting: Emergency Medicine

## 2023-09-21 ENCOUNTER — Other Ambulatory Visit: Payer: Self-pay

## 2023-09-21 DIAGNOSIS — E1165 Type 2 diabetes mellitus with hyperglycemia: Secondary | ICD-10-CM | POA: Insufficient documentation

## 2023-09-21 DIAGNOSIS — R110 Nausea: Secondary | ICD-10-CM | POA: Diagnosis not present

## 2023-09-21 DIAGNOSIS — I2510 Atherosclerotic heart disease of native coronary artery without angina pectoris: Secondary | ICD-10-CM | POA: Insufficient documentation

## 2023-09-21 DIAGNOSIS — R5383 Other fatigue: Secondary | ICD-10-CM | POA: Diagnosis present

## 2023-09-21 DIAGNOSIS — R739 Hyperglycemia, unspecified: Secondary | ICD-10-CM

## 2023-09-21 LAB — CBC WITH DIFFERENTIAL/PLATELET
Abs Immature Granulocytes: 0.04 10*3/uL (ref 0.00–0.07)
Basophils Absolute: 0 10*3/uL (ref 0.0–0.1)
Basophils Relative: 0 %
Eosinophils Absolute: 0.2 10*3/uL (ref 0.0–0.5)
Eosinophils Relative: 2 %
HCT: 40 % (ref 36.0–46.0)
Hemoglobin: 14.4 g/dL (ref 12.0–15.0)
Immature Granulocytes: 0 %
Lymphocytes Relative: 14 %
Lymphs Abs: 1.3 10*3/uL (ref 0.7–4.0)
MCH: 30.1 pg (ref 26.0–34.0)
MCHC: 36 g/dL (ref 30.0–36.0)
MCV: 83.7 fL (ref 80.0–100.0)
Monocytes Absolute: 0.7 10*3/uL (ref 0.1–1.0)
Monocytes Relative: 7 %
Neutro Abs: 7.2 10*3/uL (ref 1.7–7.7)
Neutrophils Relative %: 77 %
Platelets: 305 10*3/uL (ref 150–400)
RBC: 4.78 MIL/uL (ref 3.87–5.11)
RDW: 13.9 % (ref 11.5–15.5)
WBC: 9.3 10*3/uL (ref 4.0–10.5)
nRBC: 0 % (ref 0.0–0.2)

## 2023-09-21 LAB — CBG MONITORING, ED
Glucose-Capillary: 240 mg/dL — ABNORMAL HIGH (ref 70–99)
Glucose-Capillary: 159 mg/dL — ABNORMAL HIGH (ref 70–99)

## 2023-09-21 LAB — COMPREHENSIVE METABOLIC PANEL WITH GFR
ALT: 14 U/L (ref 0–44)
AST: 21 U/L (ref 15–41)
Albumin: 3.1 g/dL — ABNORMAL LOW (ref 3.5–5.0)
Alkaline Phosphatase: 42 U/L (ref 38–126)
Anion gap: 13 (ref 5–15)
BUN: 10 mg/dL (ref 8–23)
CO2: 22 mmol/L (ref 22–32)
Calcium: 9.2 mg/dL (ref 8.9–10.3)
Chloride: 97 mmol/L — ABNORMAL LOW (ref 98–111)
Creatinine, Ser: 0.64 mg/dL (ref 0.44–1.00)
GFR, Estimated: >60 mL/min (ref >60)
Glucose, Bld: 237 mg/dL — ABNORMAL HIGH (ref 70–99)
Potassium: 4.3 mmol/L (ref 3.5–5.1)
Sodium: 132 mmol/L — ABNORMAL LOW (ref 135–145)
Total Bilirubin: 0.9 mg/dL (ref 0.0–1.2)
Total Protein: 7 g/dL (ref 6.5–8.1)

## 2023-09-21 MED ORDER — HYDROCODONE-ACETAMINOPHEN 5-325 MG PO TABS: 0.5000 | ORAL_TABLET | Freq: Four times a day (QID) | ORAL | 0 refills | Status: DC | PRN

## 2023-09-21 MED ORDER — INSULIN ASPART 100 UNIT/ML IJ SOLN
3.0000 [IU] | Freq: Once | INTRAMUSCULAR | Status: AC
  Administered 2023-09-21: 3 [IU] via SUBCUTANEOUS
  Filled 2023-09-21: qty 1

## 2023-09-21 MED ORDER — INSULIN ASPART 100 UNIT/ML IJ SOLN: 2.0000 [IU] | Freq: Once | INTRAMUSCULAR | Status: DC

## 2023-09-21 MED ORDER — SODIUM CHLORIDE 0.9 % IV BOLUS: 500.0000 mL | Freq: Once | INTRAVENOUS | Status: DC

## 2023-09-21 MED ORDER — PROMETHAZINE HCL 25 MG PO TABS
12.5000 mg | ORAL_TABLET | Freq: Once | ORAL | Status: AC
  Administered 2023-09-21: 12.5 mg via ORAL
  Filled 2023-09-21: qty 1

## 2023-09-21 NOTE — ED Notes (Signed)
 Pt is restricted to R arm. Looked on R arm--poor vasculature. Called lab to draw CBC. CBC was not sent in triage. Pt cannot extend arm out for u/s IV.

## 2023-09-21 NOTE — ED Notes (Signed)
 Helped pt walk to bathroom twice. Provided another blanket.

## 2023-09-21 NOTE — ED Notes (Signed)
 Pt to ED for hyperglycemia, recent med changes with PCP. States CBG has been around 250 and was 400 the other day. Pt alert, oriented.

## 2023-09-21 NOTE — ED Notes (Signed)
 Pt resting comfortably

## 2023-09-21 NOTE — ED Notes (Signed)
 CBG 159. Pt needs to be walked again to bathroom before leaving.

## 2023-09-21 NOTE — ED Triage Notes (Signed)
 Pt here for hyperglycemia, CBG 240. Pt states I think I was D/C'ed too early. Pt endorses she takes 5mg  of prednisone  daily. Pt denies any other issues. Pt states takes daily CBG's at home and takes oral meds for blood sugar. NAD noted.

## 2023-09-21 NOTE — ED Provider Notes (Signed)
 [facility] Provider Note    Event Date/Time   First MD Initiated Contact with Patient 09/21/23 1130     (approximate)   History   Hyperglycemia   HPI  Elaine Cox is a 78 y.o. female with a history of diabetes, CAD who presents with complaints of fatigue, elevated glucose.  Patient reports her glucose has been above 200, she reports her PCP has been adjusting her medications.  She denies fevers or chills.  No abdominal pain.  No cough or shortness of breath, no dysuria.  Review of records demonstrates the patient was discharged from the hospital on June 10 after being treated for hyponatremia with a sodium in the 120s     Physical Exam   Triage Vital Signs: ED Triage Vitals  Encounter Vitals Group     BP 09/21/23 1044 (!) 141/80     Girls Systolic BP Percentile --      Girls Diastolic BP Percentile --      Boys Systolic BP Percentile --      Boys Diastolic BP Percentile --      Pulse Rate 09/21/23 1044 73     Resp 09/21/23 1044 17     Temp 09/21/23 1044 98.1 F (36.7 C)     Temp Source 09/21/23 1044 Oral     SpO2 09/21/23 1044 98 %     Weight 09/21/23 1045 78.4 kg (172 lb 13.5 oz)     Height 09/21/23 1045 1.575 m (5' 2)     Head Circumference --      Peak Flow --      Pain Score 09/21/23 1045 0     Pain Loc --      Pain Education --      Exclude from Growth Chart --     Most recent vital signs: Vitals:   09/21/23 1044 09/21/23 1200  BP: (!) 141/80 (!) 144/61  Pulse: 73 65  Resp: 17 20  Temp: 98.1 F (36.7 C)   SpO2: 98% 97%     General: Awake, no distress.  CV:  Good peripheral perfusion.  Resp:  Normal effort.  Abd:  No distention.  Other:     ED Results / Procedures / Treatments   Labs (all labs ordered are listed, but only abnormal results are displayed) Labs Reviewed  COMPREHENSIVE METABOLIC PANEL WITH GFR - Abnormal; Notable for the following components:      Result Value   Sodium 132 (*)    Chloride 97 (*)     Glucose, Bld 237 (*)    Albumin 3.1 (*)    All other components within normal limits  CBG MONITORING, ED - Abnormal; Notable for the following components:   Glucose-Capillary 240 (*)    All other components within normal limits  CBC WITH DIFFERENTIAL/PLATELET  CBG MONITORING, ED     EKG     RADIOLOGY     PROCEDURES:  Critical Care performed:   Procedures   MEDICATIONS ORDERED IN ED: Medications  promethazine  (PHENERGAN ) tablet 12.5 mg (12.5 mg Oral Given 09/21/23 1209)  insulin  aspart (novoLOG ) injection 3 Units (3 Units Subcutaneous Given 09/21/23 1308)     IMPRESSION / MDM / ASSESSMENT AND PLAN / ED COURSE  I reviewed the triage vital signs and the nursing notes. Patient's presentation is most consistent with exacerbation of chronic illness.  Patient presents with mildly elevated glucose, otherwise quite well-appearing, vital signs, exam is reassuring.  She complains of mild intermittent nausea which  seems to be an ongoing issue for her, she reports Phenergan  works well for this.  Medication changes may have been at the root of her difficulty controlling her sugar which she seems to do quite well.  Lab work today demonstrates mildly elevated glucose of 240, otherwise reassuring workup.  Given 3 units of subcu insulin , p.o. hydration    No indication for admission at this time, appropriate for discharge with outpatient follow-up      FINAL CLINICAL IMPRESSION(S) / ED DIAGNOSES   Final diagnoses:  Hyperglycemia     Rx / DC Orders   ED Discharge Orders     None        Note:  This document was prepared using Dragon voice recognition software and may include unintentional dictation errors.   Bryson Carbine, MD 09/21/23 1400

## 2023-11-23 ENCOUNTER — Inpatient Hospital Stay
Admission: EM | Admit: 2023-11-23 | Discharge: 2023-11-26 | DRG: 872 | Disposition: A | Discharge status: Skilled Nursing Facility | Attending: Family Medicine | Admitting: Family Medicine

## 2023-11-23 ENCOUNTER — Emergency Department

## 2023-11-23 ENCOUNTER — Other Ambulatory Visit: Payer: Self-pay

## 2023-11-23 DIAGNOSIS — E871 Hypo-osmolality and hyponatremia: Secondary | ICD-10-CM | POA: Diagnosis present

## 2023-11-23 DIAGNOSIS — K219 Gastro-esophageal reflux disease without esophagitis: Secondary | ICD-10-CM | POA: Diagnosis present

## 2023-11-23 DIAGNOSIS — I2510 Atherosclerotic heart disease of native coronary artery without angina pectoris: Secondary | ICD-10-CM | POA: Diagnosis present

## 2023-11-23 DIAGNOSIS — N390 Urinary tract infection, site not specified: Secondary | ICD-10-CM | POA: Diagnosis present

## 2023-11-23 DIAGNOSIS — Z1152 Encounter for screening for COVID-19: Secondary | ICD-10-CM

## 2023-11-23 DIAGNOSIS — H6691 Otitis media, unspecified, right ear: Secondary | ICD-10-CM | POA: Diagnosis present

## 2023-11-23 DIAGNOSIS — Z7982 Long term (current) use of aspirin: Secondary | ICD-10-CM

## 2023-11-23 DIAGNOSIS — E785 Hyperlipidemia, unspecified: Secondary | ICD-10-CM | POA: Diagnosis present

## 2023-11-23 DIAGNOSIS — Z955 Presence of coronary angioplasty implant and graft: Secondary | ICD-10-CM

## 2023-11-23 DIAGNOSIS — Z9011 Acquired absence of right breast and nipple: Secondary | ICD-10-CM

## 2023-11-23 DIAGNOSIS — R7881 Bacteremia: Secondary | ICD-10-CM | POA: Diagnosis present

## 2023-11-23 DIAGNOSIS — Z7984 Long term (current) use of oral hypoglycemic drugs: Secondary | ICD-10-CM

## 2023-11-23 DIAGNOSIS — Z8673 Personal history of transient ischemic attack (TIA), and cerebral infarction without residual deficits: Secondary | ICD-10-CM

## 2023-11-23 DIAGNOSIS — Z9221 Personal history of antineoplastic chemotherapy: Secondary | ICD-10-CM

## 2023-11-23 DIAGNOSIS — Z833 Family history of diabetes mellitus: Secondary | ICD-10-CM

## 2023-11-23 DIAGNOSIS — Z91041 Radiographic dye allergy status: Secondary | ICD-10-CM

## 2023-11-23 DIAGNOSIS — Z87442 Personal history of urinary calculi: Secondary | ICD-10-CM

## 2023-11-23 DIAGNOSIS — Z794 Long term (current) use of insulin: Secondary | ICD-10-CM

## 2023-11-23 DIAGNOSIS — Z853 Personal history of malignant neoplasm of breast: Secondary | ICD-10-CM

## 2023-11-23 DIAGNOSIS — Z8249 Family history of ischemic heart disease and other diseases of the circulatory system: Secondary | ICD-10-CM

## 2023-11-23 DIAGNOSIS — Z7952 Long term (current) use of systemic steroids: Secondary | ICD-10-CM

## 2023-11-23 DIAGNOSIS — I10 Essential (primary) hypertension: Secondary | ICD-10-CM | POA: Diagnosis present

## 2023-11-23 DIAGNOSIS — Z91013 Allergy to seafood: Secondary | ICD-10-CM

## 2023-11-23 DIAGNOSIS — Z803 Family history of malignant neoplasm of breast: Secondary | ICD-10-CM

## 2023-11-23 DIAGNOSIS — R0982 Postnasal drip: Secondary | ICD-10-CM | POA: Diagnosis present

## 2023-11-23 DIAGNOSIS — Z7902 Long term (current) use of antithrombotics/antiplatelets: Secondary | ICD-10-CM

## 2023-11-23 DIAGNOSIS — Z96651 Presence of right artificial knee joint: Secondary | ICD-10-CM | POA: Diagnosis present

## 2023-11-23 DIAGNOSIS — E11649 Type 2 diabetes mellitus with hypoglycemia without coma: Secondary | ICD-10-CM | POA: Diagnosis present

## 2023-11-23 DIAGNOSIS — Z880 Allergy status to penicillin: Secondary | ICD-10-CM

## 2023-11-23 DIAGNOSIS — F419 Anxiety disorder, unspecified: Secondary | ICD-10-CM | POA: Diagnosis present

## 2023-11-23 DIAGNOSIS — A4151 Sepsis due to Escherichia coli [E. coli]: Secondary | ICD-10-CM | POA: Diagnosis not present

## 2023-11-23 DIAGNOSIS — E876 Hypokalemia: Secondary | ICD-10-CM | POA: Diagnosis present

## 2023-11-23 DIAGNOSIS — E669 Obesity, unspecified: Secondary | ICD-10-CM | POA: Diagnosis present

## 2023-11-23 DIAGNOSIS — Z90710 Acquired absence of both cervix and uterus: Secondary | ICD-10-CM

## 2023-11-23 DIAGNOSIS — Z888 Allergy status to other drugs, medicaments and biological substances status: Secondary | ICD-10-CM

## 2023-11-23 DIAGNOSIS — M1990 Unspecified osteoarthritis, unspecified site: Secondary | ICD-10-CM | POA: Diagnosis present

## 2023-11-23 DIAGNOSIS — R531 Weakness: Secondary | ICD-10-CM | POA: Diagnosis not present

## 2023-11-23 DIAGNOSIS — D509 Iron deficiency anemia, unspecified: Secondary | ICD-10-CM | POA: Diagnosis present

## 2023-11-23 DIAGNOSIS — N3941 Urge incontinence: Secondary | ICD-10-CM | POA: Diagnosis present

## 2023-11-23 DIAGNOSIS — B961 Klebsiella pneumoniae [K. pneumoniae] as the cause of diseases classified elsewhere: Secondary | ICD-10-CM | POA: Diagnosis present

## 2023-11-23 DIAGNOSIS — Z882 Allergy status to sulfonamides status: Secondary | ICD-10-CM

## 2023-11-23 DIAGNOSIS — Z8619 Personal history of other infectious and parasitic diseases: Secondary | ICD-10-CM

## 2023-11-23 DIAGNOSIS — R350 Frequency of micturition: Secondary | ICD-10-CM

## 2023-11-23 DIAGNOSIS — R011 Cardiac murmur, unspecified: Secondary | ICD-10-CM | POA: Diagnosis present

## 2023-11-23 DIAGNOSIS — I252 Old myocardial infarction: Secondary | ICD-10-CM

## 2023-11-23 DIAGNOSIS — Z1624 Resistance to multiple antibiotics: Secondary | ICD-10-CM | POA: Diagnosis present

## 2023-11-23 DIAGNOSIS — Z79899 Other long term (current) drug therapy: Secondary | ICD-10-CM

## 2023-11-23 DIAGNOSIS — Z881 Allergy status to other antibiotic agents status: Secondary | ICD-10-CM

## 2023-11-23 DIAGNOSIS — Z886 Allergy status to analgesic agent status: Secondary | ICD-10-CM

## 2023-11-23 DIAGNOSIS — M069 Rheumatoid arthritis, unspecified: Secondary | ICD-10-CM | POA: Diagnosis present

## 2023-11-23 LAB — TROPONIN I (HIGH SENSITIVITY): Troponin I (High Sensitivity): 17 ng/L (ref <18)

## 2023-11-23 LAB — GLUCOSE, CAPILLARY
Glucose-Capillary: 66 mg/dL — ABNORMAL LOW (ref 70–99)
Glucose-Capillary: 79 mg/dL (ref 70–99)

## 2023-11-23 LAB — CBC
HCT: 35 % — ABNORMAL LOW (ref 36.0–46.0)
Hemoglobin: 12.7 g/dL (ref 12.0–15.0)
MCH: 29.5 pg (ref 26.0–34.0)
MCHC: 36.3 g/dL — ABNORMAL HIGH (ref 30.0–36.0)
MCV: 81.4 fL (ref 80.0–100.0)
Platelets: 298 K/uL (ref 150–400)
RBC: 4.3 MIL/uL (ref 3.87–5.11)
RDW: 14.3 % (ref 11.5–15.5)
WBC: 7.8 K/uL (ref 4.0–10.5)
nRBC: 0 % (ref 0.0–0.2)

## 2023-11-23 LAB — COMPREHENSIVE METABOLIC PANEL WITH GFR
ALT: 10 U/L (ref 0–44)
AST: 20 U/L (ref 15–41)
Albumin: 2.7 g/dL — ABNORMAL LOW (ref 3.5–5.0)
Alkaline Phosphatase: 46 U/L (ref 38–126)
Anion gap: 13 (ref 5–15)
BUN: 5 mg/dL — ABNORMAL LOW (ref 8–23)
CO2: 29 mmol/L (ref 22–32)
Calcium: 8.8 mg/dL — ABNORMAL LOW (ref 8.9–10.3)
Chloride: 97 mmol/L — ABNORMAL LOW (ref 98–111)
Creatinine, Ser: 0.58 mg/dL (ref 0.44–1.00)
GFR, Estimated: >60 mL/min (ref >60)
Glucose, Bld: 76 mg/dL (ref 70–99)
Potassium: 2.7 mmol/L — CL (ref 3.5–5.1)
Sodium: 139 mmol/L (ref 135–145)
Total Bilirubin: 0.8 mg/dL (ref 0.0–1.2)
Total Protein: 6.9 g/dL (ref 6.5–8.1)

## 2023-11-23 LAB — MAGNESIUM: Magnesium: 1.7 mg/dL (ref 1.7–2.4)

## 2023-11-23 LAB — SARS CORONAVIRUS 2 BY RT PCR: SARS Coronavirus 2 by RT PCR: NEGATIVE

## 2023-11-23 MED ORDER — PROCHLORPERAZINE EDISYLATE 10 MG/2ML IJ SOLN
5.0000 mg | Freq: Four times a day (QID) | INTRAMUSCULAR | Status: DC | PRN
  Administered 2023-11-24: 5 mg via INTRAVENOUS
  Filled 2023-11-23: qty 2

## 2023-11-23 MED ORDER — PANTOPRAZOLE SODIUM 20 MG PO TBEC
20.0000 mg | DELAYED_RELEASE_TABLET | Freq: Two times a day (BID) | ORAL | Status: DC
  Administered 2023-11-23 – 2023-11-26 (×6): 20 mg via ORAL
  Filled 2023-11-23 (×7): qty 1

## 2023-11-23 MED ORDER — CLOPIDOGREL BISULFATE 75 MG PO TABS
75.0000 mg | ORAL_TABLET | Freq: Every day | ORAL | Status: DC
  Administered 2023-11-24 – 2023-11-26 (×3): 75 mg via ORAL
  Filled 2023-11-23 (×3): qty 1

## 2023-11-23 MED ORDER — SODIUM CHLORIDE 0.9 % IV BOLUS
500.0000 mL | Freq: Once | INTRAVENOUS | Status: AC
  Administered 2023-11-23: 500 mL via INTRAVENOUS

## 2023-11-23 MED ORDER — SODIUM CHLORIDE 0.9 % IV BOLUS: 1000.0000 mL | Freq: Once | INTRAVENOUS | Status: DC

## 2023-11-23 MED ORDER — ASPIRIN 81 MG PO TBEC
81.0000 mg | DELAYED_RELEASE_TABLET | Freq: Every day | ORAL | Status: DC
  Administered 2023-11-24 – 2023-11-26 (×3): 81 mg via ORAL
  Filled 2023-11-23 (×3): qty 1

## 2023-11-23 MED ORDER — ACETAMINOPHEN 500 MG PO TABS
1000.0000 mg | ORAL_TABLET | Freq: Three times a day (TID) | ORAL | Status: DC | PRN
  Administered 2023-11-25 – 2023-11-26 (×3): 1000 mg via ORAL
  Filled 2023-11-23 (×4): qty 2

## 2023-11-23 MED ORDER — INSULIN GLARGINE-YFGN 100 UNIT/ML ~~LOC~~ SOLN: 15.0000 [IU] | Freq: Every day | SUBCUTANEOUS | Status: DC

## 2023-11-23 MED ORDER — HYDRALAZINE HCL 20 MG/ML IJ SOLN: 10.0000 mg | Freq: Four times a day (QID) | INTRAMUSCULAR | Status: DC | PRN

## 2023-11-23 MED ORDER — POTASSIUM CHLORIDE ER 20 MEQ PO TBCR: 1.0000 | EXTENDED_RELEASE_TABLET | Freq: Two times a day (BID) | ORAL | Status: DC

## 2023-11-23 MED ORDER — ENOXAPARIN SODIUM 40 MG/0.4ML IJ SOSY
40.0000 mg | PREFILLED_SYRINGE | INTRAMUSCULAR | Status: DC
  Administered 2023-11-23 – 2023-11-25 (×3): 40 mg via SUBCUTANEOUS
  Filled 2023-11-23 (×3): qty 0.4

## 2023-11-23 MED ORDER — POTASSIUM CHLORIDE 10 MEQ/100ML IV SOLN
10.0000 meq | Freq: Once | INTRAVENOUS | Status: AC
  Administered 2023-11-23: 10 meq via INTRAVENOUS
  Filled 2023-11-23: qty 100

## 2023-11-23 MED ORDER — POTASSIUM CHLORIDE CRYS ER 20 MEQ PO TBCR
40.0000 meq | EXTENDED_RELEASE_TABLET | Freq: Once | ORAL | Status: AC
  Administered 2023-11-23: 40 meq via ORAL
  Filled 2023-11-23: qty 2

## 2023-11-23 MED ORDER — INSULIN ASPART 100 UNIT/ML IJ SOLN
0.0000 [IU] | Freq: Every day | INTRAMUSCULAR | Status: DC
  Administered 2023-11-24: 2 [IU] via SUBCUTANEOUS
  Filled 2023-11-23: qty 1

## 2023-11-23 MED ORDER — METOPROLOL SUCCINATE ER 50 MG PO TB24
50.0000 mg | ORAL_TABLET | Freq: Every day | ORAL | Status: DC
  Administered 2023-11-23 – 2023-11-26 (×4): 50 mg via ORAL
  Filled 2023-11-23 (×4): qty 1

## 2023-11-23 MED ORDER — GUAIFENESIN-DM 100-10 MG/5ML PO SYRP
10.0000 mL | ORAL_SOLUTION | Freq: Four times a day (QID) | ORAL | Status: DC | PRN
  Administered 2023-11-23 – 2023-11-24 (×3): 10 mL via ORAL
  Filled 2023-11-23 (×3): qty 10

## 2023-11-23 MED ORDER — METOCLOPRAMIDE HCL 5 MG/ML IJ SOLN
10.0000 mg | Freq: Once | INTRAMUSCULAR | Status: AC
  Administered 2023-11-23: 10 mg via INTRAVENOUS
  Filled 2023-11-23: qty 2

## 2023-11-23 MED ORDER — POLYETHYLENE GLYCOL 3350 17 G PO PACK: 17.0000 g | PACK | Freq: Two times a day (BID) | ORAL | Status: DC | PRN

## 2023-11-23 MED ORDER — ALPRAZOLAM 0.25 MG PO TABS
0.2500 mg | ORAL_TABLET | Freq: Every evening | ORAL | Status: DC | PRN
  Administered 2023-11-24 – 2023-11-25 (×2): 0.25 mg via ORAL
  Filled 2023-11-23 (×2): qty 1

## 2023-11-23 MED ORDER — IPRATROPIUM-ALBUTEROL 0.5-2.5 (3) MG/3ML IN SOLN
3.0000 mL | Freq: Once | RESPIRATORY_TRACT | Status: AC
  Administered 2023-11-23: 3 mL via RESPIRATORY_TRACT
  Filled 2023-11-23: qty 3

## 2023-11-23 MED ORDER — AMLODIPINE BESYLATE 10 MG PO TABS
10.0000 mg | ORAL_TABLET | Freq: Every day | ORAL | Status: DC
  Administered 2023-11-23 – 2023-11-26 (×4): 10 mg via ORAL
  Filled 2023-11-23: qty 1
  Filled 2023-11-23: qty 2
  Filled 2023-11-23 (×2): qty 1

## 2023-11-23 MED ORDER — INSULIN ASPART 100 UNIT/ML IJ SOLN
0.0000 [IU] | Freq: Three times a day (TID) | INTRAMUSCULAR | Status: DC
  Administered 2023-11-24: 5 [IU] via SUBCUTANEOUS
  Filled 2023-11-23: qty 1

## 2023-11-23 MED ORDER — POTASSIUM CHLORIDE CRYS ER 20 MEQ PO TBCR
40.0000 meq | EXTENDED_RELEASE_TABLET | Freq: Once | ORAL | Status: AC
Start: 2023-11-23 — End: 2023-11-23
  Administered 2023-11-23: 40 meq via ORAL
  Filled 2023-11-23: qty 2

## 2023-11-23 MED ORDER — IBUPROFEN 400 MG PO TABS
400.0000 mg | ORAL_TABLET | Freq: Four times a day (QID) | ORAL | Status: DC | PRN
  Administered 2023-11-23 – 2023-11-26 (×6): 400 mg via ORAL
  Filled 2023-11-23 (×6): qty 1

## 2023-11-23 NOTE — ED Triage Notes (Signed)
 Pt to ED via POV from home. Pt reports increased weakness x1 month. Pt reports has been on multiple antibiotics for URI. Pt reports N/V/D, SOB and CP from coughing.

## 2023-11-23 NOTE — ED Notes (Signed)
 Pt taken to xray

## 2023-11-23 NOTE — ED Provider Notes (Signed)
 [facility] Provider Note    Event Date/Time   First MD Initiated Contact with Patient 11/23/23 [phone]     (approximate)   History   Weakness   HPI  Elaine Cox is a 78 year old female with history of diabetes, HTN, CAD presenting to the emergency department for evaluation of weakness.  Patient notes she has had cough, congestion ongoing for over a month.  She has been on antibiotics for URI as well as an ear infection.  Continues to have ongoing cough productive of white sputum.  Reports nausea with rare vomiting.  Has had multiple episodes of nonbloody diarrhea.  Denies abdominal pain.  Reports chest wall pain from coughing, otherwise denies chest pain.  Has been following up with her primary care doctor, but continues to have symptoms with associated weakness leading her to present to the ER.     Physical Exam   Triage Vital Signs: ED Triage Vitals  Encounter Vitals Group     BP 11/23/23 0847 (!) 159/118     Girls Systolic BP Percentile --      Girls Diastolic BP Percentile --      Boys Systolic BP Percentile --      Boys Diastolic BP Percentile --      Pulse Rate 11/23/23 0847 100     Resp 11/23/23 0847 18     Temp 11/23/23 0847 98.8 F (37.1 C)     Temp Source 11/23/23 0847 Oral     SpO2 11/23/23 0847 98 %     Weight --      Height --      Head Circumference --      Peak Flow --      Pain Score 11/23/23 0848 5     Pain Loc --      Pain Education --      Exclude from Growth Chart --     Most recent vital signs: Vitals:   11/23/23 0847 11/23/23 1130  BP: (!) 159/118 (!) 151/82  Pulse: 100 83  Resp: 18 18  Temp: 98.8 F (37.1 C)   SpO2: 98% 100%     General: Awake, interactive  CV:  Regular rate, good peripheral perfusion.  Resp:  Mildly labored respirations, lungs overall clear to auscultation, no wheezing, frequent coughing noted Abd:  Nondistended, soft, no significant tenderness to palpation Neuro:  Symmetric facial  movement, fluid speech   ED Results / Procedures / Treatments   Labs (all labs ordered are listed, but only abnormal results are displayed) Labs Reviewed  CBC - Abnormal; Notable for the following components:      Result Value   HCT 35.0 (*)    MCHC 36.3 (*)    All other components within normal limits  COMPREHENSIVE METABOLIC PANEL WITH GFR - Abnormal; Notable for the following components:   Potassium 2.7 (*)    Chloride 97 (*)    BUN 5 (*)    Calcium  8.8 (*)    Albumin 2.7 (*)    All other components within normal limits  MAGNESIUM   TROPONIN I (HIGH SENSITIVITY)     EKG EKG independently reviewed and interpreted by myself demonstrates:  EKG demonstrates normal sinus rhythm rate of 100, PR 138, QRS 70, QTc 459, nonspecific ST changes   RADIOLOGY Imaging independently reviewed and interpreted by myself demonstrates:  CXR without focal consolidation  Formal Radiology Read:  DG Chest 2 View Result Date: 11/23/2023 CLINICAL DATA:  78 year old female with chest pain.  EXAM: CHEST - 2 VIEW COMPARISON:  Chest radiograph 09/15/2023 and earlier. FINDINGS: AP and lateral views 0900 hours. Lower lung volumes, especially on the lateral. Heart size and mediastinal contours stable and within normal limits. Visualized tracheal air column is within normal limits. Allowing for crowding of markings on the lateral, no pneumothorax, pleural effusion, pulmonary edema, or confluent lung opacity. Abdominal Calcified aortic atherosclerosis. Mild dextroconvex scoliosis. No acute osseous abnormality identified. Negative visible bowel gas. IMPRESSION: 1. Lower lung volumes. No acute cardiopulmonary abnormality. 2. Aortic Atherosclerosis (ICD10-I70.0). Electronically Signed   By: VEAR Hurst M.D.   On: 11/23/2023 09:32    PROCEDURES:  Critical Care performed: Yes, see critical care procedure note(s)  CRITICAL CARE Performed by: Nilsa Dade   Total critical care time: 31 minutes  Critical care time was  exclusive of separately billable procedures and treating other patients.  Critical care was necessary to treat or prevent imminent or life-threatening deterioration.  Critical care was time spent personally by me on the following activities: development of treatment plan with patient and/or surrogate as well as nursing, discussions with consultants, evaluation of patient's response to treatment, examination of patient, obtaining history from patient or surrogate, ordering and performing treatments and interventions, ordering and review of laboratory studies, ordering and review of radiographic studies, pulse oximetry and re-evaluation of patient's condition.   Procedures   MEDICATIONS ORDERED IN ED: Medications  metoCLOPramide  (REGLAN ) injection 10 mg (has no administration in time range)  sodium chloride  0.9 % bolus 500 mL (0 mLs Intravenous Stopped 11/23/23 1217)  ipratropium-albuterol  (DUONEB) 0.5-2.5 (3) MG/3ML nebulizer solution 3 mL (3 mLs Nebulization Given 11/23/23 1055)  potassium chloride  10 mEq in 100 mL IVPB (0 mEq Intravenous Stopped 11/23/23 1217)  potassium chloride  SA (KLOR-CON  M) CR tablet 40 mEq (40 mEq Oral Given 11/23/23 1056)     IMPRESSION / MDM / ASSESSMENT AND PLAN / ED COURSE  I reviewed the triage vital signs and the nursing notes.  Differential diagnosis includes, but is not limited to, viral illness, superimposed pneumonia, anemia, electrolyte abnormality, lower suspicion ACS, pneumothorax, very low suspicion PE or dissection based on clinical history  Patient's presentation is most consistent with acute presentation with potential threat to life or bodily function.  78 year old female presenting to the emergency department for evaluation of weakness.  Stable vitals on presentation.  CBC overall reassuring.  CMP notable for hypokalemia with K of 2.7.  Negative troponin.  Chest x-Eragon Hammond without focal consolidation.  EKG without acute ischemic findings.  Patient ordered  for IV and oral repletion for her hypokalemia.  Trialed DuoNeb treatment, but no significant improvement in her respiratory status.  On reassessment, patient continues to feel generally weak which I suspect is partly related to her hypokalemia.  With her ongoing vomiting and diarrhea, do think admission is reasonable.  Will reach out to hospitalist team.  Clinical Course as of 11/23/23 1219  Mon Nov 23, 2023  1202 Case discussed with hospitalist team.  Initially discussed possibility of discharge as she does not appear to be significantly dehydrated and was able to tolerate oral repletion.  However, on reevaluation, patient remains uncomfortable with discharge home in the setting of her generalized weakness.  Concerned about ability to care for herself.  Discussed again with Dr. Awanda with hospitalist team who will evaluate for anticipated admission. [NR]    Clinical Course User Index [NR] Dade Nilsa, MD     FINAL CLINICAL IMPRESSION(S) / ED DIAGNOSES   Final diagnoses:  Generalized  weakness  Hypokalemia     Rx / DC Orders   ED Discharge Orders     None        Note:  This document was prepared using Dragon voice recognition software and may include unintentional dictation errors.   Levander Slate, MD 11/23/23 [phone]

## 2023-11-23 NOTE — ED Notes (Addendum)
 MD Ray informed of K+ of 2.7

## 2023-11-23 NOTE — ED Notes (Signed)
 Pt given warm blankets and pillow to rest arm on

## 2023-11-23 NOTE — H&P (Signed)
 History and Physical    Elaine Cox FMW:[id number] DOB: Jan 14, 1946 DOA: 11/23/2023  PCP: Sadie Manna, MD  Patient coming from: home  I have personally briefly reviewed patient's old medical records in [facility]  Chief Complaint: weakness  HPI: Elaine Cox is a 78 y.o. female with medical history significant of HTN, DM, CAD, rheumatoid arthritis, breast cancer (s/p of chemotherapy), stroke, anxiety, obesity, who presented for weakness and inability to take care of self.  Pt reported weakness for months, her daughter (with whom pt lives with) can no longer take care of her, that's why she can't go home.  ROS pan positive when asked; positive for diarrhea, spitting up, feeling something stuck in her throat, getting winded, coughing with milky sputum, pain in her joints, pain in chest due to coughing, all have been present for a while.    ED Course: initial vitals: afebrile, pulse 100, BP 159/118, RR 18, sating 98% on room air.  Labs notable for potassium 2.7.  CXR neg for acute finding.  Pt said she could not go home due to having no one to take care of her.  ED provider requested admission.      Assessment/Plan  # Weakness --No focal neurological deficit.   --PT/OT  # Cough --No hypoxia, CXR neg acute finding. --Covid, RVP --Robitussin  # Arthritis pain --no current opioids Rx --Tylenol  and Advil  PRN  # HTN --cont home amlodipine  and Toprol   # DM2 --ACHS and SSI for now  # Hx of CAD and stroke --cont home ASA and plavix  --resume statin after discharge  # Anxiety on chronic Benzo --cont home Xanax  PRN   DVT prophylaxis: Lovenox  SQ Code Status: Full code  Family Communication:   Disposition Plan: to be determined  Consults called: none Level of care: Med-Surg   Review of Systems: As per HPI otherwise complete review of systems negative.   Past Medical History:  Diagnosis Date   Anginal pain (HCC) 06/2016   being followed by dr. bosie    Anxiety    Arthritis    Osteoarthritis   Arthritis    Rheumatoid   Breast cancer (HCC) 2000   Right Breast - Chemotherapy   Collagen vascular disease (HCC)    Rheumatoid Arthritis.   Coronary artery disease    Diabetes mellitus without complication [facility])    Patient takes Metformin    Dyspnea 03/2017   GERD (gastroesophageal reflux disease)    History of abdominal hysterectomy    History of eyelid surgery    History of kidney stones    Hyperlipidemia    Hypertension    Lumbago    Murmur    Myocardial infarction (HCC) 07/2010   Orthopnea 03/2017   Personal history of chemotherapy 2000   right breast ca   Sinus disorder    Stroke [facility]) 2006, 2012   Wears dentures    full upper and lower    Past Surgical History:  Procedure Laterality Date   ABDOMINAL HYSTERECTOMY     BREAST SURGERY Right    mastectomy    CARDIAC CATHETERIZATION  2012   stent placed   CATARACT EXTRACTION W/PHACO Right 06/24/2017   Procedure: CATARACT EXTRACTION PHACO AND INTRAOCULAR LENS PLACEMENT (IOC) COMPLICATED RIGHT DIABETIC;  Surgeon: Mittie Gaskin, MD;  Location: [facility];  Service: Ophthalmology;  Laterality: Right;  Diabetes - oral med   COLONOSCOPY     COLONOSCOPY WITH PROPOFOL  N/A 12/08/2017   Procedure: COLONOSCOPY WITH PROPOFOL ;  Surgeon: Dorr, Teodoro K,  MD;  Location: [facility] END[facility]COPY;  Service: Gastroenterology;  Laterality: N/A;   CORONARY ANGIOPLASTY     CORONARY STENT INTERVENTION N/A 02/24/2018   Procedure: CORONARY STENT INTERVENTION;  Surgeon: Ammon Blunt, MD;  Location: [facility] INVASIVE CV LAB;  Service: Cardiovascular;  Laterality: N/A;   CORONARY STENT INTERVENTION N/A 03/25/2023   Procedure: CORONARY STENT INTERVENTION;  Surgeon: Ammon Blunt, MD;  Location: [facility] INVAS[facility] CV LAB;  Service: Cardiovascular;  Laterality: N/A;   CYSTOSCOPY N/A 09/08/2016   Procedure: CYSTOSCOPY;  Surgeon: Ward, Mitzie BROCKS, MD;  Location: [facility] ORS;  Service: Gynecology;   Laterality: N/A;   EYE SURGERY Left    Cataract Extraction with IOL   KNEE ARTHROPLASTY Right 03/14/2020   Procedure: COMPUTER ASSISTED TOTAL KNEE ARTHROPLASTY;  Surgeon: Mardee Lynwood SQUIBB, MD;  Location: [facility] ORS;  Service: Orthopedics;  Laterality: Right;  FOLLOWING 1ST CASE   KNEE ARTHROSCOPY W/ MENISCAL REPAIR Right    LAPAROSCOPIC BILATERAL SALPINGO OOPHERECTOMY Bilateral 09/08/2016   Procedure: LAPAROSCOPIC BILATERAL SALPINGO OOPHORECTOMY;  Surgeon: Ward, Mitzie BROCKS, MD;  Location: [facility] ORS;  Service: Gynecology;  Laterality: Bilateral;   LEFT HEART CATH AND CORONARY ANGIOGRAPHY Left 02/24/2018   Procedure: LEFT HEART CATH AND CORONARY ANGIOGRAPHY;  Surgeon: Ammon Blunt, MD;  Location: [facility] INV[facility] CV LAB;  Service: Cardiovascular;  Laterality: Left;   LEFT HEART CATH AND CORONARY ANGIOGRAPHY Left 03/25/2023   Procedure: LEFT HEART CATH AND CORONARY ANGIOGRAPHY;  Surgeon: Ammon Blunt, MD;  Location: [facility] INV[facility] CV LAB;  Service: Cardiovascular;  Laterality: Left;   MASTECTOMY Right 2000   BREAST CA     reports that she has never smoked. She has never used smokeless tobacco. She reports that she does not drink alcohol  and does not use drugs.  Allergies  Allergen Reactions   Doxazosin Other (See Comments)    headache   Fish Allergy Anaphylaxis, Itching, Nausea Only and Swelling    She states her tongue turns black.    Dicloxacillin Nausea And Vomiting    Has patient had a PCN reaction causing immediate rash, facial/tongue/throat swelling, SOB or lightheadedness with hypotension: No Has patient had a PCN reaction causing severe rash involving mucus membranes or skin necrosis: No Has patient had a PCN reaction that required hospitalization: Unknown Has patient had a PCN reaction occurring within the last 10 years: Unknown If all of the above answers are NO, then may proceed with Cephalosporin use.   Iodine Hives    Per patient, she had a previous reaction/hives  from an iodine injection   Sulfa Antibiotics Swelling    Loss of appetite   Constipation bloating Other reaction(s): Headache    Cardura [Doxazosin Mesylate]     Headache    Etodolac Itching and Swelling   Sulfamethoxazole-Trimethoprim Nausea Only   Zofran  [Ondansetron ]    Azithromycin Itching   Ciprofloxacin  Nausea Only and Rash   Clopidogrel  Nausea And Vomiting   Colchicine Nausea Only and Other (See Comments)    Dizzness   Glucosamine Nausea And Vomiting and Other (See Comments)   Leflunomide Diarrhea and Itching   Meloxicam Itching   Methotrexate Nausea Only   Zoloft [Sertraline] Nausea And Vomiting    Family History  Problem Relation Age of Onset   Diabetes type II Mother    Hypertension Mother    Colon polyps Mother    Breast cancer Maternal Aunt     Prior to Admission medications   Medication Sig Start Date End Date Taking? Authorizing Provider  ACCU-CHEK GUIDE test strip  12/08/19   [provider]  acetaminophen  (TYLENOL ) 325 MG tablet Take 2 tablets (650 mg total) by mouth every 6 (six) hours as needed for mild pain (or Fever >/= 101). Patient taking differently: Take 1,000 mg by mouth every 6 (six) hours as needed for mild pain (pain score 1-3) (or Fever >/= 101). 03/09/18   Gouru, Aruna, MD  acetaminophen -codeine (TYLENOL  #3) 300-30 MG tablet Take 1 tablet by mouth 2 (two) times daily as needed. FOR PAIN UP TO 10 DAYS    [provider]  Alcohol  Swabs (B-D SINGLE USE SWABS REGULAR) PADS  12/20/19   [provider]  ALPRAZolam  (XANAX ) 0.25 MG tablet Take 0.25 mg by mouth at bedtime. 03/16/18   [provider]  amLODipine  (NORVASC ) 10 MG tablet Take 10 mg by mouth daily.    [provider]  APPLE CIDER VINEGAR PO Take 1 capsule by mouth daily.    [provider]  aspirin  EC 81 MG EC tablet Take 1 tablet (81 mg total) by mouth daily. Swallow whole. 08/17/20   Jens Durand, MD  Biotin 5000 MCG TABS Take 10,000 mcg by  mouth daily.    [provider]  Blood Glucose Calibration (TRUE METRIX LEVEL 1) Low SOLN  10/24/19   [provider]  Cholecalciferol (D 1000) 25 MCG (1000 UT) capsule Take 1,000 Units by mouth daily.    [provider]  clopidogrel  (PLAVIX ) 75 MG tablet Take 1 tablet (75 mg total) by mouth daily with breakfast. 03/26/23   Hudson, Caralyn, PA-C  diclofenac Sodium (VOLTAREN) 1 % GEL Apply 2 g topically daily as needed (pain).    [provider]  glimepiride  (AMARYL ) 4 MG tablet Take 4 mg by mouth daily with breakfast. 10/14/22   [provider]  HYDROcodone -acetaminophen  (NORCO/VICODIN) 5-325 MG tablet Take 0.5-1 tablets by mouth every 6 (six) hours as needed for moderate pain (pain score 4-6). 09/21/23   Arlander Charleston, MD  Menthol -Methyl Salicylate (MUSCLE RUB) 10-15 % CREA Apply 1 application topically as needed for muscle pain.    [provider]  metoprolol  succinate (TOPROL -XL) 50 MG 24 hr tablet Take 50 mg by mouth daily. 06/15/19   [provider]  Multiple Vitamins-Minerals (MULTIVITAMIN WITH MINERALS) tablet Take 1 tablet by mouth daily.    [provider]  nitrofurantoin, macrocrystal-monohydrate, (MACROBID) 100 MG capsule Take 100 mg by mouth 2 (two) times daily. For 5 days    [provider]  pantoprazole  (PROTONIX ) 20 MG tablet Take 1 tablet (20 mg total) by mouth 2 (two) times daily before meals Take 30 minutes before meals and other medications. 05/14/23 05/13/24  [provider]  Potassium Chloride  ER 20 MEQ TBCR Take 1 tablet by mouth 2 (two) times daily. 09/14/23   [provider]  predniSONE  (DELTASONE ) 5 MG tablet Take 5 mg by mouth daily with breakfast. 03/10/23   [provider]  Probiotic Product (ALIGN) 4 MG CAPS Take 8 mg by mouth daily.    [provider]  promethazine  (PHENERGAN ) 12.5 MG tablet Take 1 tablet (12.5 mg total) by mouth every 6 (six) hours as needed for  nausea or vomiting. 09/17/23   Jhonny, Calvin NOVAK, MD  RESTASIS  0.05 % ophthalmic emulsion Place 1 drop into both eyes daily as needed (Dry eyes).    [provider]  rosuvastatin  (CRESTOR ) 10 MG tablet Take 10 mg by mouth daily.    [provider]  tiZANidine  (ZANAFLEX ) 4 MG tablet Take 4  mg by mouth 3 (three) times daily. 09/11/23 09/10/24  [provider]    Physical Exam: Vitals:   11/23/23 0847 11/23/23 1114 11/23/23 1130  BP: (!) 159/118  (!) 151/82  Pulse: 100  83  Resp: 18  18  Temp: 98.8 F (37.1 C)    TempSrc: Oral    SpO2: 98%  100%  Weight:  77.6 kg   Height:  5' 2 (1.575 m)     Constitutional: NAD, AAOx3 HEENT: conjunctivae and lids normal, EOMI CV: No cyanosis.   RESP: normal respiratory effort, on RA Neuro: II - XII grossly intact.     Labs on Admission: I have personally reviewed labs and imaging studies  Time spent: 60 minutes  Ellouise Haber MD [facility]  If 7PM-7AM, please contact night-coverage 11/23/2023, 12:53 PM

## 2023-11-23 NOTE — ED Notes (Signed)
 Patient assisted to toilet in room and back to bed without incident. Monitor reapplied. Hospitalist in with patient.

## 2023-11-24 DIAGNOSIS — E669 Obesity, unspecified: Secondary | ICD-10-CM | POA: Diagnosis present

## 2023-11-24 DIAGNOSIS — Z8249 Family history of ischemic heart disease and other diseases of the circulatory system: Secondary | ICD-10-CM | POA: Diagnosis not present

## 2023-11-24 DIAGNOSIS — B961 Klebsiella pneumoniae [K. pneumoniae] as the cause of diseases classified elsewhere: Secondary | ICD-10-CM | POA: Diagnosis present

## 2023-11-24 DIAGNOSIS — R7881 Bacteremia: Secondary | ICD-10-CM | POA: Diagnosis present

## 2023-11-24 DIAGNOSIS — Z7984 Long term (current) use of oral hypoglycemic drugs: Secondary | ICD-10-CM | POA: Diagnosis not present

## 2023-11-24 DIAGNOSIS — D509 Iron deficiency anemia, unspecified: Secondary | ICD-10-CM | POA: Diagnosis present

## 2023-11-24 DIAGNOSIS — Z79899 Other long term (current) drug therapy: Secondary | ICD-10-CM | POA: Diagnosis not present

## 2023-11-24 DIAGNOSIS — F419 Anxiety disorder, unspecified: Secondary | ICD-10-CM | POA: Diagnosis present

## 2023-11-24 DIAGNOSIS — A4151 Sepsis due to Escherichia coli [E. coli]: Secondary | ICD-10-CM | POA: Diagnosis present

## 2023-11-24 DIAGNOSIS — I10 Essential (primary) hypertension: Secondary | ICD-10-CM | POA: Diagnosis present

## 2023-11-24 DIAGNOSIS — Z1152 Encounter for screening for COVID-19: Secondary | ICD-10-CM | POA: Diagnosis not present

## 2023-11-24 DIAGNOSIS — Z9011 Acquired absence of right breast and nipple: Secondary | ICD-10-CM | POA: Diagnosis not present

## 2023-11-24 DIAGNOSIS — E11649 Type 2 diabetes mellitus with hypoglycemia without coma: Secondary | ICD-10-CM | POA: Diagnosis present

## 2023-11-24 DIAGNOSIS — Z794 Long term (current) use of insulin: Secondary | ICD-10-CM | POA: Diagnosis not present

## 2023-11-24 DIAGNOSIS — I2510 Atherosclerotic heart disease of native coronary artery without angina pectoris: Secondary | ICD-10-CM | POA: Diagnosis present

## 2023-11-24 DIAGNOSIS — E876 Hypokalemia: Secondary | ICD-10-CM | POA: Diagnosis present

## 2023-11-24 DIAGNOSIS — Z7902 Long term (current) use of antithrombotics/antiplatelets: Secondary | ICD-10-CM | POA: Diagnosis not present

## 2023-11-24 DIAGNOSIS — Z833 Family history of diabetes mellitus: Secondary | ICD-10-CM | POA: Diagnosis not present

## 2023-11-24 DIAGNOSIS — N390 Urinary tract infection, site not specified: Secondary | ICD-10-CM | POA: Diagnosis present

## 2023-11-24 DIAGNOSIS — R531 Weakness: Secondary | ICD-10-CM | POA: Diagnosis present

## 2023-11-24 DIAGNOSIS — E785 Hyperlipidemia, unspecified: Secondary | ICD-10-CM | POA: Diagnosis present

## 2023-11-24 DIAGNOSIS — Z9221 Personal history of antineoplastic chemotherapy: Secondary | ICD-10-CM | POA: Diagnosis not present

## 2023-11-24 DIAGNOSIS — B9620 Unspecified Escherichia coli [E. coli] as the cause of diseases classified elsewhere: Secondary | ICD-10-CM | POA: Diagnosis not present

## 2023-11-24 DIAGNOSIS — M069 Rheumatoid arthritis, unspecified: Secondary | ICD-10-CM | POA: Diagnosis present

## 2023-11-24 DIAGNOSIS — Z7982 Long term (current) use of aspirin: Secondary | ICD-10-CM | POA: Diagnosis not present

## 2023-11-24 DIAGNOSIS — Z1624 Resistance to multiple antibiotics: Secondary | ICD-10-CM | POA: Diagnosis present

## 2023-11-24 DIAGNOSIS — E871 Hypo-osmolality and hyponatremia: Secondary | ICD-10-CM | POA: Diagnosis present

## 2023-11-24 LAB — URINALYSIS, COMPLETE (UACMP) WITH MICROSCOPIC
Bilirubin Urine: NEGATIVE
Glucose, UA: 150 mg/dL — AB
Hgb urine dipstick: NEGATIVE
Ketones, ur: NEGATIVE mg/dL
Nitrite: NEGATIVE
Protein, ur: 30 mg/dL — AB
Specific Gravity, Urine: 1.023 (ref 1.005–1.030)
WBC, UA: >50 WBC/hpf (ref 0–5)
pH: 6 (ref 5.0–8.0)

## 2023-11-24 LAB — BLOOD CULTURE ID PANEL (REFLEXED) - BCID2

## 2023-11-24 LAB — MAGNESIUM
Magnesium: 1.9 mg/dL (ref 1.7–2.4)
Magnesium: 1.8 mg/dL (ref 1.7–2.4)

## 2023-11-24 LAB — RESPIRATORY PANEL BY PCR

## 2023-11-24 LAB — GLUCOSE, CAPILLARY
Glucose-Capillary: 102 mg/dL — ABNORMAL HIGH (ref 70–99)
Glucose-Capillary: 153 mg/dL — ABNORMAL HIGH (ref 70–99)
Glucose-Capillary: 234 mg/dL — ABNORMAL HIGH (ref 70–99)
Glucose-Capillary: 240 mg/dL — ABNORMAL HIGH (ref 70–99)
Glucose-Capillary: 76 mg/dL (ref 70–99)

## 2023-11-24 LAB — BASIC METABOLIC PANEL WITH GFR
Anion gap: 7 (ref 5–15)
BUN: 8 mg/dL (ref 8–23)
CO2: 26 mmol/L (ref 22–32)
Calcium: 8.7 mg/dL — ABNORMAL LOW (ref 8.9–10.3)
Chloride: 102 mmol/L (ref 98–111)
Creatinine, Ser: 0.77 mg/dL (ref 0.44–1.00)
GFR, Estimated: >60 mL/min (ref >60)
Glucose, Bld: 63 mg/dL — ABNORMAL LOW (ref 70–99)
Potassium: 3.6 mmol/L (ref 3.5–5.1)
Sodium: 135 mmol/L (ref 135–145)

## 2023-11-24 LAB — CBC
HCT: 31.5 % — ABNORMAL LOW (ref 36.0–46.0)
Hemoglobin: 11.6 g/dL — ABNORMAL LOW (ref 12.0–15.0)
MCH: 30 pg (ref 26.0–34.0)
MCHC: 36.8 g/dL — ABNORMAL HIGH (ref 30.0–36.0)
MCV: 81.4 fL (ref 80.0–100.0)
Platelets: 280 K/uL (ref 150–400)
RBC: 3.87 MIL/uL (ref 3.87–5.11)
RDW: 14.5 % (ref 11.5–15.5)
WBC: 7.6 K/uL (ref 4.0–10.5)
nRBC: 0 % (ref 0.0–0.2)

## 2023-11-24 LAB — POTASSIUM: Potassium: 3.5 mmol/L (ref 3.5–5.1)

## 2023-11-24 MED ORDER — INSULIN ASPART 100 UNIT/ML IJ SOLN
0.0000 [IU] | Freq: Three times a day (TID) | INTRAMUSCULAR | Status: DC
  Administered 2023-11-25: 3 [IU] via SUBCUTANEOUS
  Administered 2023-11-25: 2 [IU] via SUBCUTANEOUS
  Administered 2023-11-25: 1 [IU] via SUBCUTANEOUS
  Administered 2023-11-26: 3 [IU] via SUBCUTANEOUS
  Administered 2023-11-26: 1 [IU] via SUBCUTANEOUS
  Filled 2023-11-24 (×5): qty 1

## 2023-11-24 MED ORDER — CARBAMIDE PEROXIDE 6.5 % OT SOLN
5.0000 [drp] | Freq: Two times a day (BID) | OTIC | Status: DC
  Administered 2023-11-25 – 2023-11-26 (×3): 5 [drp] via OTIC
  Filled 2023-11-24: qty 15

## 2023-11-24 MED ORDER — GUAIFENESIN ER 600 MG PO TB12
600.0000 mg | ORAL_TABLET | Freq: Two times a day (BID) | ORAL | Status: DC
  Administered 2023-11-24 – 2023-11-26 (×4): 600 mg via ORAL
  Filled 2023-11-24 (×4): qty 1

## 2023-11-24 MED ORDER — HYDROCOD POLI-CHLORPHE POLI ER 10-8 MG/5ML PO SUER
5.0000 mL | Freq: Two times a day (BID) | ORAL | Status: DC | PRN
  Administered 2023-11-25 – 2023-11-26 (×2): 5 mL via ORAL
  Filled 2023-11-24 (×2): qty 5

## 2023-11-24 MED ORDER — SODIUM CHLORIDE 0.9 % IV SOLN
2.0000 g | Freq: Every day | INTRAVENOUS | Status: DC
  Administered 2023-11-24 – 2023-11-25 (×2): 2 g via INTRAVENOUS
  Filled 2023-11-24 (×3): qty 20

## 2023-11-24 MED ORDER — DEXTROSE 50 % IV SOLN
25.0000 mL | Freq: Once | INTRAVENOUS | Status: DC
  Filled 2023-11-24: qty 50

## 2023-11-24 NOTE — Progress Notes (Signed)
 Hypoglycemic Event  CBG: 66  Treatment: 4 oz juice/soda  Symptoms: Sweaty  Follow-up CBG: Time:2350 CBG Result:79  Possible Reasons for Event: Inadequate meal intake  Comments/MD notified: Dr. Lawence Notified    Elaine Cox

## 2023-11-24 NOTE — Progress Notes (Signed)
 PHARMACY - PHYSICIAN COMMUNICATION CRITICAL VALUE ALERT - BLOOD CULTURE IDENTIFICATION (BCID)  Elaine Cox is an 78 y.o. female who presented to [facility] on 11/23/2023 with a chief complaint of weakness (for months)  Assessment:  blood culture from 8/18 with GNR in 1 of 2 sets currently,  BCID detects E coli.    Name of physician (or Provider) Contacted: Dr Awanda  Current antibiotics: none  Changes to prescribed antibiotics recommended:  Recommendations accepted by provider  Results for orders placed or performed during the hospital encounter of 11/23/23  Blood Culture ID Panel (Reflexed) (Collected: 11/23/2023  9:34 PM)  Result Value Ref Range   Enterococcus faecalis NOT DETECTED NOT DETECTED   Enterococcus Faecium NOT DETECTED NOT DETECTED   Listeria monocytogenes NOT DETECTED NOT DETECTED   Staphylococcus species NOT DETECTED NOT DETECTED   Staphylococcus aureus (BCID) NOT DETECTED NOT DETECTED   Staphylococcus epidermidis NOT DETECTED NOT DETECTED   Staphylococcus lugdunensis NOT DETECTED NOT DETECTED   Streptococcus species NOT DETECTED NOT DETECTED   Streptococcus agalactiae NOT DETECTED NOT DETECTED   Streptococcus pneumoniae NOT DETECTED NOT DETECTED   Streptococcus pyogenes NOT DETECTED NOT DETECTED   A.calcoaceticus-baumannii NOT DETECTED NOT DETECTED   Bacteroides fragilis NOT DETECTED NOT DETECTED   Enterobacterales DETECTED (A) NOT DETECTED   Enterobacter cloacae complex NOT DETECTED NOT DETECTED   Escherichia coli DETECTED (A) NOT DETECTED   Klebsiella aerogenes NOT DETECTED NOT DETECTED   Klebsiella oxytoca NOT DETECTED NOT DETECTED   Klebsiella pneumoniae NOT DETECTED NOT DETECTED   Proteus species NOT DETECTED NOT DETECTED   Salmonella species NOT DETECTED NOT DETECTED   Serratia marcescens NOT DETECTED NOT DETECTED   Haemophilus influenzae NOT DETECTED NOT DETECTED   Neisseria meningitidis NOT DETECTED NOT DETECTED   Pseudomonas aeruginosa NOT DETECTED  NOT DETECTED   Stenotrophomonas maltophilia NOT DETECTED NOT DETECTED   Candida albicans NOT DETECTED NOT DETECTED   Candida auris NOT DETECTED NOT DETECTED   Candida glabrata NOT DETECTED NOT DETECTED   Candida krusei NOT DETECTED NOT DETECTED   Candida parapsilosis NOT DETECTED NOT DETECTED   Candida tropicalis NOT DETECTED NOT DETECTED   Cryptococcus neoformans/gattii NOT DETECTED NOT DETECTED   CTX-M ESBL NOT DETECTED NOT DETECTED   Carbapenem resistance IMP NOT DETECTED NOT DETECTED   Carbapenem resistance KPC NOT DETECTED NOT DETECTED   Carbapenem resistance NDM NOT DETECTED NOT DETECTED   Carbapenem resist OXA 48 LIKE NOT DETECTED NOT DETECTED   Carbapenem resistance VIM NOT DETECTED NOT DETECTED    Celestine Slovak, PharmD, BCPS, BCIDP Work Cell: [phone] 11/24/2023 11:01 AM

## 2023-11-24 NOTE — NC FL2 (Signed)
 [place]  MEDICAID FL2 LEVEL OF CARE FORM     IDENTIFICATION  Patient Name: Elaine Cox Birthdate: 1945-12-12 Sex: female Admission Date (Current Location): 11/23/2023  [facility] and [place] Number:  Chiropodist and Address:         Provider Number: [id number]  Attending Physician Name and Address:  Awanda City, MD  Relative Name and Phone Number:       Current Level of Care: Hospital Recommended Level of Care: Skilled Nursing Facility Prior Approval Number:    Date Approved/Denied:   PASRR Number: [id number]  Discharge Plan: SNF    Current Diagnoses: Patient Active Problem List   Diagnosis Date Noted   E coli bacteremia 11/24/2023   Weakness 11/23/2023   Chest pain 09/15/2023   Nausea vomiting and diarrhea 09/15/2023   Obesity (BMI 30-39.9) 09/15/2023   Hyponatremia 09/15/2023   Diabetes mellitus without complication (HCC) 09/15/2023   Severe sepsis (HCC) 08/15/2020   Pyelonephritis 08/14/2020   Total knee replacement status 03/14/2020   Pelvic pressure in female 03/13/2020   Panic anxiety syndrome 11/18/2018   Syncope 03/07/2018   NSTEMI (non-ST elevated myocardial infarction) (HCC) 02/22/2018   Anxiety 02/22/2018   Diabetes (HCC) 02/22/2018   HTN (hypertension) 02/22/2018   GERD (gastroesophageal reflux disease) 02/22/2018   CAD (coronary artery disease) 02/22/2018   HLD (hyperlipidemia) 02/22/2018   Unilateral primary osteoarthritis, right knee 09/23/2017   Bursitis of right shoulder 05/25/2017   Encounter for long-term (current) use of high-risk medication 01/24/2016   Numbness and tingling in left arm 10/24/2015   Seropositive rheumatoid arthritis (HCC) 10/24/2015   Elbow joint effusion, left 07/13/2015   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) 05/03/2015   Stroke (HCC) 03/14/2014   Osteoarthritis 11/16/2013   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   Benign essential hypertension 07/02/2013   Enthesopathy of  hip region 07/02/2013   Low back pain 07/02/2013    Orientation RESPIRATION BLADDER Height & Weight     Self, Time, Situation, Place  Normal Continent Weight: 77.6 kg Height:  5' 2 (157.5 cm)  BEHAVIORAL SYMPTOMS/MOOD NEUROLOGICAL BOWEL NUTRITION STATUS      Continent Diet (regular)  AMBULATORY STATUS COMMUNICATION OF NEEDS Skin   Limited Assist Verbally Normal                       Personal Care Assistance Level of Assistance              Functional Limitations Info             SPECIAL CARE FACTORS FREQUENCY  PT (By licensed PT), OT (By licensed OT)                    Contractures Contractures Info: Not present    Additional Factors Info  Code Status, Allergies Code Status Info: Full Allergies Info: Doxazosin, Fish Allergy, Dicloxacillin, Iodine, Sulfa Antibiotics, Cardura (Doxazosin Mesylate), Etodolac, Sulfamethoxazole-trimethoprim, Zofran  (Ondansetron ), Azithromycin, Ciprofloxacin , Clopidogrel , Colchicine, Glucosamine, Leflunomide, Meloxicam, Methotrexate, Zoloft (Sertraline)           Current Medications (11/24/2023):  This is the current hospital active medication list Current Facility-Administered Medications  Medication Dose Route Frequency Provider Last Rate Last Admin   acetaminophen  (TYLENOL ) tablet 1,000 mg  1,000 mg Oral TID PRN Awanda City, MD       ALPRAZolam  (XANAX ) tablet 0.25 mg  0.25 mg Oral QHS PRN Awanda City, MD       amLODipine  (NORVASC ) tablet 10 mg  10 mg Oral Daily Awanda City, MD   10 mg at 11/24/23 0949   aspirin  EC tablet 81 mg  81 mg Oral Daily Awanda City, MD   81 mg at 11/24/23 1247   carbamide peroxide (DEBROX) 6.5 % OTIC (EAR) solution 5 drop  5 drop Right EAR BID Awanda City, MD       cefTRIAXone  (ROCEPHIN ) 2 g in sodium chloride  0.9 % 100 mL IVPB  2 g Intravenous Daily Awanda City, MD 200 mL/hr at 11/24/23 1505 2 g at 11/24/23 1505   clopidogrel  (PLAVIX ) tablet 75 mg  75 mg Oral Q breakfast Awanda City, MD   75 mg at 11/24/23 0737    dextrose  50 % solution 25 mL  25 mL Intravenous Once Mansy, Jan A, MD       enoxaparin  (LOVENOX ) injection 40 mg  40 mg Subcutaneous Q24H Awanda City, MD   40 mg at 11/23/23 2018   guaiFENesin -dextromethorphan  (ROBITUSSIN DM) 100-10 MG/5ML syrup 10 mL  10 mL Oral Q6H PRN Awanda City, MD   10 mL at 11/24/23 0737   hydrALAZINE  (APRESOLINE ) injection 10 mg  10 mg Intravenous Q6H PRN Awanda City, MD       ibuprofen  (ADVIL ) tablet 400 mg  400 mg Oral Q6H PRN Awanda City, MD   400 mg at 11/24/23 0948   insulin  aspart (novoLOG ) injection 0-15 Units  0-15 Units Subcutaneous TID WC Awanda City, MD   5 Units at 11/24/23 1247   insulin  aspart (novoLOG ) injection 0-5 Units  0-5 Units Subcutaneous QHS Awanda City, MD       metoprolol  succinate (TOPROL -XL) 24 hr tablet 50 mg  50 mg Oral Daily Awanda City, MD   50 mg at 11/24/23 9050   pantoprazole  (PROTONIX ) EC tablet 20 mg  20 mg Oral BID Awanda City, MD   20 mg at 11/24/23 0949   polyethylene glycol (MIRALAX  / GLYCOLAX ) packet 17 g  17 g Oral BID PRN Awanda City, MD       prochlorperazine  (COMPAZINE ) injection 5 mg  5 mg Intravenous Q6H PRN Awanda City, MD         Discharge Medications: Please see discharge summary for a list of discharge medications.  Relevant Imaging Results:  Relevant Lab Results:   Additional Information ss [id number]  Corean ONEIDA Haddock, RN

## 2023-11-24 NOTE — Progress Notes (Signed)
  PROGRESS NOTE    Elaine Cox  FMW:[id number] DOB: 11/21/1945 DOA: 11/23/2023 PCP: Sadie Manna, MD  [id number]  LOS: 0 days   Brief hospital course:   Assessment & Plan: Elaine Cox is a 78 y.o. female with medical history significant of HTN, DM, CAD, rheumatoid arthritis, breast cancer (s/p of chemotherapy), stroke, anxiety, obesity, who presented for weakness and inability to take care of self.   On admission, pt had pan-positive ROS with main complaints of weakness and cough.  Shortly after admission, pt was noted to have mild fever to 100.4, so blood cx obtained, which returned pos for E coli the next day.   # E coli bacteremia --1 of 4 blood cx pos for E coli.  Pt denied urinary symptoms. --obtained UA and urine cx --start ceftriaxone   # Sepsis --fever and tachycardia, source E coli bacteremia  # Weakness --No focal neurological deficit.   --PT/OT re SNF rehab   # Cough --No hypoxia, CXR neg acute finding. --Covid, RVP neg --Robitussin PRN   # Arthritis pain --no current opioids Rx --Tylenol  and Advil  PRN   # HTN --cont home amlodipine  and Toprol    # DM2 Hypoglycemia --BG varied widely.   --hold long-acting for now --decrease SSI to sensitive scale   # Hx of CAD and stroke --cont home ASA and plavix  --resume statin after discharge   # Anxiety on chronic Benzo --cont home Xanax  PRN  Hypokalemia --monitor and supplement PRN   DVT prophylaxis: Lovenox  SQ Code Status: Full code  Family Communication:  Level of care: Med-Surg Dispo:   The patient is from: home Anticipated d/c is to: SNF rehab Anticipated d/c date is: 2-3 days   Subjective and Interval History:  Pt reported cough and right ear pain.  Had mild fever to 100.4 last night.  Blood cx 1 of 4 pos for E coli today.   Objective: Vitals:   11/24/23 0809 11/24/23 0912 11/24/23 1718 11/24/23 2003  BP: (!) 150/55 (!) 146/69 129/63 (!) 137/56  Pulse: 78 88 79 84  Resp:  18  16 18   Temp: 98.6 F (37 C)  99.1 F (37.3 C) 98.9 F (37.2 C)  TempSrc:      SpO2: 96% 98% 98% 96%  Weight:      Height:        Intake/Output Summary (Last 24 hours) at 11/24/2023 2112 Last data filed at 11/24/2023 1921 Gross per 24 hour  Intake 700 ml  Output --  Net 700 ml   Filed Weights   11/23/23 1114  Weight: 77.6 kg    Examination:   Constitutional: NAD, AAOx3 HEENT: conjunctivae and lids normal, EOMI CV: No cyanosis.   RESP: normal respiratory effort, on RA Neuro: II - XII grossly intact.   Psych: Normal mood and affect.  Appropriate judgement and reason   Data Reviewed: I have personally reviewed labs and imaging studies  Time spent: 50 minutes  Ellouise Haber, MD [facility] If 7PM-7AM, please contact night-coverage 11/24/2023, 9:12 PM

## 2023-11-24 NOTE — TOC Initial Note (Signed)
 Transition of Care [facility]) - Initial/Assessment Note    Patient Details  Name: Elaine Cox MRN: [id number] Date of Birth: 02/10/1946  Transition of Care [facility]) CM/SW Contact:    Corean ONEIDA Haddock, RN Phone Number: 11/24/2023, 3:39 PM  Clinical Narrative:                  Admitted for weakness/ cough Admitted from: home with daughter PCP: Hande  Current home health/prior home health/DME: RW  Therapy recommending SNF.  Patient in agreement PASRR obtained, fl2 sent for signature, bed search initiated       Patient Goals and CMS Choice            Expected Discharge Plan and Services                                              Prior Living Arrangements/Services                       Activities of Daily Living   ADL Screening (condition at time of admission) Independently performs ADLs?: Yes (appropriate for developmental age) Is the patient deaf or have difficulty hearing?: No Does the patient have difficulty seeing, even when wearing glasses/contacts?: No Does the patient have difficulty concentrating, remembering, or making decisions?: No  Permission Sought/Granted                  Emotional Assessment              Admission diagnosis:  Hypokalemia [E87.6] Weakness [R53.1] Generalized weakness [R53.1] E coli bacteremia [R78.81, B96.20] Patient Active Problem List   Diagnosis Date Noted   E coli bacteremia 11/24/2023   Weakness 11/23/2023   Chest pain 09/15/2023   Nausea vomiting and diarrhea 09/15/2023   Obesity (BMI 30-39.9) 09/15/2023   Hyponatremia 09/15/2023   Diabetes mellitus without complication (HCC) 09/15/2023   Severe sepsis (HCC) 08/15/2020   Pyelonephritis 08/14/2020   Total knee replacement status 03/14/2020   Pelvic pressure in female 03/13/2020   Panic anxiety syndrome 11/18/2018   Syncope 03/07/2018   NSTEMI (non-ST elevated myocardial infarction) (HCC) 02/22/2018   Anxiety 02/22/2018   Diabetes  (HCC) 02/22/2018   HTN (hypertension) 02/22/2018   GERD (gastroesophageal reflux disease) 02/22/2018   CAD (coronary artery disease) 02/22/2018   HLD (hyperlipidemia) 02/22/2018   Unilateral primary osteoarthritis, right knee 09/23/2017   Bursitis of right shoulder 05/25/2017   Encounter for long-term (current) use of high-risk medication 01/24/2016   Numbness and tingling in left arm 10/24/2015   Seropositive rheumatoid arthritis (HCC) 10/24/2015   Elbow joint effusion, left 07/13/2015   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 [place] [address] Phone: [phone] Fax: [phone]     Social Drivers of Health (SDOH) Social History: SDOH Screenings   Food Insecurity: No Food Insecurity (11/23/2023)  Housing: Low Risk  (11/23/2023)  Transportation Needs: No Transportation Needs (11/23/2023)  Utilities: Not At Risk (11/23/2023)  Depression (PHQ2-9): Medium Risk (04/12/2018)  Financial Resource Strain: Low Risk  (10/27/2023)  Received from [facility]  Social Connections: Moderately Integrated (11/23/2023)  Tobacco Use: Low Risk  (11/23/2023)   SDOH Interventions:     Readmission Risk Interventions     No data to display

## 2023-11-24 NOTE — Evaluation (Addendum)
 Physical Therapy Evaluation Patient Details Name: ALLINA RICHES MRN: [id number] DOB: 1945/08/30 Today's Date: 11/24/2023  History of Present Illness  Patient is a 78 year old female presenting with weakness and inability to take care of self. PMH: HTN, DM, CAD, rheumatoid arthritis, breast cancer (s/p chemotherapy), stroke, anxiety, obesity  Clinical Impression  Patient agreeable to PT evaluation. She reports she lives with her daughter who works and is unable to provide mobility assistance in the home. She ambulates with a rolling walker. She also reports increasing difficulty with mobility recently.  Today the patient required Mod A for lifting to stand from both bed and from toilet. She required occasional steadying assistance with ambulation using rolling walker. She reports continuous mild dizziness with all mobility which has been ongoing at home as well. Activity tolerance limited by fatigue. Recommend to continue PT to maximize independence. Rehabilitation < 3 hours/day recommended after this hospital stay if patient does not have caregiver assistance at home.        If plan is discharge home, recommend the following: A little help with walking and/or transfers;A little help with bathing/dressing/bathroom;Assistance with cooking/housework;Assist for transportation;Help with stairs or ramp for entrance   Can travel by private vehicle   No    Equipment Recommendations None recommended by PT  Recommendations for Other Services       Functional Status Assessment Patient has had a recent decline in their functional status and demonstrates the ability to make significant improvements in function in a reasonable and predictable amount of time.     Precautions / Restrictions Precautions Precautions: Fall Recall of Precautions/Restrictions: Intact Restrictions Weight Bearing Restrictions Per Provider Order: No      Mobility  Bed Mobility Overal bed mobility: Needs  Assistance Bed Mobility: Supine to Sit, Sit to Supine     Supine to sit: Supervision, HOB elevated Sit to supine: Min assist   General bed mobility comments: assistance required for LE to return to bed    Transfers Overall transfer level: Needs assistance Equipment used: Rolling walker (2 wheels) Transfers: Sit to/from Stand Sit to Stand: Mod assist           General transfer comment: Mod A lifting assistance required for standing from bed and from toilet    Ambulation/Gait Ambulation/Gait assistance: Min assist, Contact guard assist Gait Distance (Feet): 20 Feet Assistive device: Rolling walker (2 wheels) Gait Pattern/deviations: Step-through pattern Gait velocity: decreased     General Gait Details: occasional steadying assistance provided. mild continuous dizziness reported with mobility  Stairs            Wheelchair Mobility     Tilt Bed    Modified Rankin (Stroke Patients Only)       Balance Overall balance assessment: Needs assistance Sitting-balance support: Feet supported Sitting balance-Leahy Scale: Fair     Standing balance support: Single extremity supported Standing balance-Leahy Scale: Fair                               Pertinent Vitals/Pain Pain Assessment Pain Assessment: Faces Faces Pain Scale: Hurts little more Pain Location: right shoulder, left elbow Pain Descriptors / Indicators: Discomfort Pain Intervention(s): Limited activity within patient's tolerance, Monitored during session, Repositioned    Home Living Family/patient expects to be discharged to:: Private residence Living Arrangements: Children Available Help at Discharge: Family;Available PRN/intermittently Type of Home: House Home Access: Stairs to enter Entrance Stairs-Rails: None Entrance Stairs-Number of Steps: 1  Home Layout: One level Home Equipment: Agricultural consultant (2 wheels);Cane - single point;Shower seat Additional Comments: daughter works  and patient reports she is unable to physically help her    Prior Function Prior Level of Function : Independent/Modified Independent             Mobility Comments: using a rolling walker for mobility. limited distances. has had 4 falls at home this year ADLs Comments: Mod I, increasing difficulty recently     Extremity/Trunk Assessment   Upper Extremity Assessment Upper Extremity Assessment: LUE deficits/detail;RUE deficits/detail RUE Deficits / Details: chronic shoulder pain with some guarding with movement. limited shouler ROM LUE Deficits / Details: chronic elbow pain reported with some guarding with active movement. limited ROM shoulder and elbow    Lower Extremity Assessment Lower Extremity Assessment: Generalized weakness       Communication   Communication Communication: No apparent difficulties    Cognition Arousal: Alert Behavior During Therapy: WFL for tasks assessed/performed   PT - Cognitive impairments: No apparent impairments                         Following commands: Intact       Cueing Cueing Techniques: Verbal cues     General Comments      Exercises     Assessment/Plan    PT Assessment Patient needs continued PT services  PT Problem List Decreased strength;Decreased range of motion;Decreased activity tolerance;Decreased balance;Decreased mobility       PT Treatment Interventions DME instruction;Gait training;Stair training;Functional mobility training;Therapeutic activities;Therapeutic exercise;Balance training;Neuromuscular re-education;Patient/family education    PT Goals (Current goals can be found in the Care Plan section)  Acute Rehab PT Goals Patient Stated Goal: to get stronger PT Goal Formulation: With patient Time For Goal Achievement: 12/08/23 Potential to Achieve Goals: Good    Frequency Min 2X/week     Co-evaluation               AM-PAC PT 6 Clicks Mobility  Outcome Measure Help needed turning  from your back to your side while in a flat bed without using bedrails?: None Help needed moving from lying on your back to sitting on the side of a flat bed without using bedrails?: A Little Help needed moving to and from a bed to a chair (including a wheelchair)?: A Little Help needed standing up from a chair using your arms (e.g., wheelchair or bedside chair)?: A Little Help needed to walk in hospital room?: A Little Help needed climbing 3-5 steps with a railing? : A Lot 6 Click Score: 18    End of Session   Activity Tolerance: Patient tolerated treatment well;Patient limited by fatigue Patient left: in bed;with call bell/phone within reach;with bed alarm set Nurse Communication: Mobility status PT Visit Diagnosis: Unsteadiness on feet (R26.81);Muscle weakness (generalized) (M62.81)    Time: 9156-9085 PT Time Calculation (min) (ACUTE ONLY): 31 min   Charges:   PT Evaluation $PT Eval Moderate Complexity: 1 Mod PT Treatments $Therapeutic Activity: 8-22 mins PT General Charges $$ ACUTE PT VISIT: 1 Visit         Randine Essex, PT, MPT   Randine LULLA Essex 11/24/2023, 10:00 AM

## 2023-11-25 DIAGNOSIS — R531 Weakness: Secondary | ICD-10-CM | POA: Diagnosis not present

## 2023-11-25 LAB — CBC
HCT: 31.9 % — ABNORMAL LOW (ref 36.0–46.0)
Hemoglobin: 11.5 g/dL — ABNORMAL LOW (ref 12.0–15.0)
MCH: 29.8 pg (ref 26.0–34.0)
MCHC: 36.1 g/dL — ABNORMAL HIGH (ref 30.0–36.0)
MCV: 82.6 fL (ref 80.0–100.0)
Platelets: 280 K/uL (ref 150–400)
RBC: 3.86 MIL/uL — ABNORMAL LOW (ref 3.87–5.11)
RDW: 14.2 % (ref 11.5–15.5)
WBC: 6.7 K/uL (ref 4.0–10.5)
nRBC: 0 % (ref 0.0–0.2)

## 2023-11-25 LAB — GLUCOSE, CAPILLARY
Glucose-Capillary: 135 mg/dL — ABNORMAL HIGH (ref 70–99)
Glucose-Capillary: 266 mg/dL — ABNORMAL HIGH (ref 70–99)
Glucose-Capillary: 170 mg/dL — ABNORMAL HIGH (ref 70–99)
Glucose-Capillary: 136 mg/dL — ABNORMAL HIGH (ref 70–99)

## 2023-11-25 LAB — BASIC METABOLIC PANEL WITH GFR
Anion gap: 9 (ref 5–15)
BUN: 8 mg/dL (ref 8–23)
CO2: 25 mmol/L (ref 22–32)
Calcium: 8.6 mg/dL — ABNORMAL LOW (ref 8.9–10.3)
Chloride: 100 mmol/L (ref 98–111)
Creatinine, Ser: 0.43 mg/dL — ABNORMAL LOW (ref 0.44–1.00)
GFR, Estimated: >60 mL/min (ref >60)
Glucose, Bld: 147 mg/dL — ABNORMAL HIGH (ref 70–99)
Potassium: 3.2 mmol/L — ABNORMAL LOW (ref 3.5–5.1)
Sodium: 134 mmol/L — ABNORMAL LOW (ref 135–145)

## 2023-11-25 LAB — MAGNESIUM: Magnesium: 1.8 mg/dL (ref 1.7–2.4)

## 2023-11-25 LAB — RETICULOCYTES
Immature Retic Fract: 23.5 % — ABNORMAL HIGH (ref 2.3–15.9)
RBC.: 3.77 MIL/uL — ABNORMAL LOW (ref 3.87–5.11)
Retic Count, Absolute: 65.6 K/uL (ref 19.0–186.0)
Retic Ct Pct: 1.7 % (ref 0.4–3.1)

## 2023-11-25 LAB — IRON AND TIBC
Iron: 27 ug/dL — ABNORMAL LOW (ref 28–170)
Saturation Ratios: 16 % (ref 10.4–31.8)
TIBC: 171 ug/dL — ABNORMAL LOW (ref 250–450)
UIBC: 144 ug/dL

## 2023-11-25 LAB — VITAMIN B12: Vitamin B-12: 1281 pg/mL — ABNORMAL HIGH (ref 180–914)

## 2023-11-25 LAB — FERRITIN: Ferritin: 119 ng/mL (ref 11–307)

## 2023-11-25 LAB — FOLATE: Folate: 11.8 ng/mL (ref >5.9)

## 2023-11-25 MED ORDER — FERROUS SULFATE 325 (65 FE) MG PO TABS
325.0000 mg | ORAL_TABLET | Freq: Every day | ORAL | Status: DC
  Administered 2023-11-25 – 2023-11-26 (×2): 325 mg via ORAL
  Filled 2023-11-25 (×3): qty 1

## 2023-11-25 MED ORDER — FLUTICASONE PROPIONATE 50 MCG/ACT NA SUSP
1.0000 | Freq: Every day | NASAL | Status: DC
  Administered 2023-11-25 – 2023-11-26 (×2): 1 via NASAL
  Filled 2023-11-25: qty 16

## 2023-11-25 MED ORDER — POTASSIUM CHLORIDE CRYS ER 20 MEQ PO TBCR
40.0000 meq | EXTENDED_RELEASE_TABLET | Freq: Once | ORAL | Status: AC
  Administered 2023-11-25: 40 meq via ORAL
  Filled 2023-11-25: qty 2

## 2023-11-25 MED ORDER — SALINE SPRAY 0.65 % NA SOLN: 1.0000 | NASAL | Status: DC | PRN

## 2023-11-25 NOTE — Progress Notes (Signed)
 Mobility Specialist - Progress Note   11/25/23 1430  Mobility  Activity Ambulated with assistance  Level of Assistance Contact guard assist, steadying assist  Assistive Device None;Front wheel walker  Distance Ambulated (ft) 24 ft  Activity Response Tolerated well  Mobility visit 1 Mobility  Mobility Specialist Start Time (ACUTE ONLY) 1411  Mobility Specialist Stop Time (ACUTE ONLY) 1425  Mobility Specialist Time Calculation (min) (ACUTE ONLY) 14 min   Elaine Cox Mobility Specialist 11/25/23 2:34 PM

## 2023-11-25 NOTE — Progress Notes (Signed)
 Mobility Specialist - Progress Note   11/25/23 1009  Mobility  Activity Pivoted/transferred to/from BSC;Stood at bedside;Ambulated with assistance  Level of Assistance Contact guard assist, steadying assist  Assistive Device None  Distance Ambulated (ft) 12 ft  Activity Response Tolerated well  Mobility visit 1 Mobility  Mobility Specialist Start Time (ACUTE ONLY) [id number]  Mobility Specialist Stop Time (ACUTE ONLY) 0850  Mobility Specialist Time Calculation (min) (ACUTE ONLY) 12 min   Pt in fowler position upon entry, utilizing RA. Pt requesting to transfer to the [facility]. Pt completed bed mob with HHA to bring trunk from sup to sit. Pt transferred to the [facility] via SPT CGA. Pt STS from [facility], stood at the sink from ~ 2 mins washing face and brushing teeth. Pt amb around the bed towards the recliner CGA HHA of the RUE-- furniture cruising with the LUE. Pt left seated in the recliner with alarm set and needs within reach.   America Silvan Mobility Specialist 11/25/23 10:33 AM

## 2023-11-25 NOTE — Plan of Care (Signed)

## 2023-11-25 NOTE — TOC Progression Note (Signed)
 Transition of Care [facility]) - Progression Note    Patient Details  Name: Elaine Cox MRN: [id number] Date of Birth: 11-Jan-1946  Transition of Care [facility]) CM/SW Contact  Asberry CHRISTELLA Jaksch, RN Phone Number: 11/25/2023, 2:41 PM  Clinical Narrative:     I spoke with patient at bedside to present bed offers. She deferred choice to her daughter Rexene. I called Rexene and presented bed offers. After presenting offers, Rexene accepted bed at [facility]. Beverly notified and insurance auth started.                    Expected Discharge Plan and Services                                               Social Drivers of Health (SDOH) Interventions SDOH Screenings   Food Insecurity: No Food Insecurity (11/23/2023)  Housing: Low Risk  (11/23/2023)  Transportation Needs: No Transportation Needs (11/23/2023)  Utilities: Not At Risk (11/23/2023)  Depression (PHQ2-9): Medium Risk (04/12/2018)  Financial Resource Strain: Low Risk  (10/27/2023)   Received from [facility]  Social Connections: Moderately Integrated (11/23/2023)  Tobacco Use: Low Risk  (11/23/2023)    Readmission Risk Interventions     No data to display

## 2023-11-25 NOTE — Progress Notes (Signed)
 PROGRESS NOTE    Elaine Cox  FMW:[id number] DOB: 02-02-1946 DOA: 11/23/2023 PCP: Sadie Manna, MD  Chief Complaint  Patient presents with   Weakness    Hospital Course:  Elaine Cox is a 78 year old female with hypertension, diabetes, CAD, rheumatoid arthritis, breast cancer status postchemotherapy, prior CVA, anxiety, obesity, who presented with generalized weakness and inability to take care of herself.  On admission patient endorsed pan positive ROS with additional complaints of weakness and cough.  She was admitted for SNF placement.  Shortly after admission noted to have fever of 100.4.  Blood cultures were obtained which resulted positive for E. coli 1 out of 4.  She was started on ceftriaxone .  TOC consulted to arrange SNF placement.  Subjective: This morning patient's chief complaint is right ear pain.  She reports that she was on antibiotics prior to arrival for this right ear infection.  She is endorsing some postnasal drip causing an irritating cough as well.  We have discussed Flonase  and sinus rinse.  She reports that she is intolerant of nasal sprays.  We discussed her current antibiotic coverage is also covering for sinus infection if present.   Objective: Vitals:   11/24/23 0912 11/24/23 1718 11/24/23 2003 11/25/23 0250  BP: (!) 146/69 129/63 (!) 137/56 132/75  Pulse: 88 79 84 83  Resp:  16 18 18   Temp:  99.1 F (37.3 C) 98.9 F (37.2 C) 98.1 F (36.7 C)  TempSrc:      SpO2: 98% 98% 96% 94%  Weight:      Height:        Intake/Output Summary (Last 24 hours) at 11/25/2023 0813 Last data filed at 11/24/2023 1921 Gross per 24 hour  Intake 460 ml  Output --  Net 460 ml   Filed Weights   11/23/23 1114  Weight: 77.6 kg    Examination: General exam: Appears calm and comfortable, NAD  Respiratory system: No work of breathing, symmetric chest wall expansion Cardiovascular system: S1 & S2 heard, RRR.  Gastrointestinal system: Abdomen is  nondistended, soft and nontender.  Neuro: Alert and oriented. No focal neurological deficits. Extremities: Symmetric, expected ROM Skin: No rashes, lesions Psychiatry: Demonstrates appropriate judgement and insight. Mood & affect appropriate for situation.  *Attempted otoscopic exam, not available at this time.  Assessment & Plan:  Principal Problem:   Weakness Active Problems:   E coli bacteremia   E. coli bacteremia - 1 of 4 blood cultures positive - Denying urinary symptoms - UA: Small leuks, rare bacteria.  Urine culture sent, will follow - Continue ceftriaxone  for now, follow culture sensitivities  Sepsis Criteria: Fever, tachycardia, source: Bacteremia  Weakness - No focal neurologic deficits - Continue PT/OT, pending SNF placement  Cough - No hypoxia, CXR without evidence of acute findings - RVP negative - As needed Robitussin - Suspect postnasal drip given patient's complaint of right ear infection.  Have added Flonase  and sinus rinse  Recent ear infection - Was receiving antibiotic eardrops outpatient.  Ceftriaxone  should be providing adequate coverage now.  Arthritis - Continue current medications as needed  Type 2 diabetes Hypoglycemia - Labile blood sugars - Hold long-acting insulin  for now - Continue with sensitive sliding scale, titrate daily as needed  CAD History of CVA - Continue home dose aspirin  and Plavix  - Continue statin at DC  Anxiety - On chronic benzodiazepines - Continue home dose Xanax  as needed  Hypokalemia Hyponatremia - Continue to replace as needed  Normocytic anemia - Hemoglobin 11.6, slightly  lower than her baseline which appears to be closer to 14 - Denies any overt blood loss - TIBC low, iron low.  Initiate iron supplementation - Will refer to GI at discharge for consideration of colonoscopy   DVT prophylaxis: lovenox    Code Status: Full Code Disposition: Medically ready to discharge.  Pending SNF placement.  TOC  aware  Consultants:    Procedures:    Antimicrobials:  Anti-infectives (From admission, onward)    Start     Dose/Rate Route Frequency Ordered Stop   11/24/23 1345  cefTRIAXone  (ROCEPHIN ) 2 g in sodium chloride  0.9 % 100 mL IVPB        2 g 200 mL/hr over 30 Minutes Intravenous Daily 11/24/23 1257         Data Reviewed: I have personally reviewed following labs and imaging studies CBC: Recent Labs  Lab 11/23/23 0945 11/24/23 1616 11/25/23 0339  WBC 7.8 7.6 6.7  HGB 12.7 11.6* 11.5*  HCT 35.0* 31.5* 31.9*  MCV 81.4 81.4 82.6  PLT 298 280 280   Basic Metabolic Panel: Recent Labs  Lab 11/23/23 0945 11/24/23 0333 11/24/23 1616 11/25/23 0339  NA 139  --  135 134*  K 2.7* 3.5 3.6 3.2*  CL 97*  --  102 100  CO2 29  --  26 25  GLUCOSE 76  --  63* 147*  BUN 5*  --  8 8  CREATININE 0.58  --  0.77 0.43*  CALCIUM  8.8*  --  8.7* 8.6*  MG 1.7 1.9 1.8 1.8   GFR: Estimated Creatinine Clearance: 56.8 mL/min (A) (by C-G formula based on SCr of 0.43 mg/dL (L)). Liver Function Tests: Recent Labs  Lab 11/23/23 0945  AST 20  ALT 10  ALKPHOS 46  BILITOT 0.8  PROT 6.9  ALBUMIN 2.7*   CBG: Recent Labs  Lab 11/24/23 0019 11/24/23 0805 11/24/23 1210 11/24/23 1712 11/24/23 2129  GLUCAP 153* 102* 234* 76 240*    Recent Results (from the past 240 hours)  Culture, blood (Routine X 2) w Reflex to ID Panel     Status: None (Preliminary result)   Collection Time: 11/23/23  7:45 PM   Specimen: BLOOD  Result Value Ref Range Status   Specimen Description BLOOD BLOOD LEFT HAND  Final   Special Requests   Final    BOTTLES DRAWN AEROBIC ONLY Blood Culture adequate volume   Culture   Final    NO GROWTH < 12 HOURS Performed at [facility], [address] [place], [place] [address]    Report Status PENDING  Incomplete  SARS Coronavirus 2 by RT PCR (hospital order, performed in [facility] hospital lab) *cepheid single result test* Anterior Nasal Swab     Status:  None   Collection Time: 11/23/23  8:21 PM   Specimen: Anterior Nasal Swab  Result Value Ref Range Status   SARS Coronavirus 2 by RT PCR NEGATIVE NEGATIVE Final    Comment: (NOTE) SARS-CoV-2 target nucleic acids are NOT DETECTED.  The SARS-CoV-2 RNA is generally detectable in upper and lower respiratory specimens during the acute phase of infection. The lowest concentration of SARS-CoV-2 viral copies this assay can detect is 250 copies / mL. A negative result does not preclude SARS-CoV-2 infection and should not be used as the sole basis for treatment or other patient management decisions.  A negative result may occur with improper specimen collection / handling, submission of specimen other than nasopharyngeal swab, presence of viral mutation(s) within the areas  targeted by this assay, and inadequate number of viral copies (<250 copies / mL). A negative result must be combined with clinical observations, patient history, and epidemiological information.  Fact Sheet for Patients:   RoadLapTop.co.za  Fact Sheet for Healthcare Providers: http://kim-miller.com/  This test is not yet approved or  cleared by the [place]  FDA and has been authorized for detection and/or diagnosis of SARS-CoV-2 by FDA under an Emergency Use Authorization (EUA).  This EUA will remain in effect (meaning this test can be used) for the duration of the COVID-19 declaration under Section 564(b)(1) of the Act, 21 U.S.C. section 360bbb-3(b)(1), unless the authorization is terminated or revoked sooner.  Performed at [facility], [address] [place], [place] [address]   Respiratory (~20 pathogens) panel by PCR     Status: None   Collection Time: 11/23/23  8:21 PM   Specimen: Nasopharyngeal Swab; Respiratory  Result Value Ref Range Status   Adenovirus NOT DETECTED NOT DETECTED Final   Coronavirus 229E NOT DETECTED NOT DETECTED Final    Comment:  (NOTE) The Coronavirus on the Respiratory Panel, DOES NOT test for the novel  Coronavirus (2019 nCoV)    Coronavirus HKU1 NOT DETECTED NOT DETECTED Final   Coronavirus NL63 NOT DETECTED NOT DETECTED Final   Coronavirus OC43 NOT DETECTED NOT DETECTED Final   Metapneumovirus NOT DETECTED NOT DETECTED Final   Rhinovirus / Enterovirus NOT DETECTED NOT DETECTED Final   Influenza A NOT DETECTED NOT DETECTED Final   Influenza B NOT DETECTED NOT DETECTED Final   Parainfluenza Virus 1 NOT DETECTED NOT DETECTED Final   Parainfluenza Virus 2 NOT DETECTED NOT DETECTED Final   Parainfluenza Virus 3 NOT DETECTED NOT DETECTED Final   Parainfluenza Virus 4 NOT DETECTED NOT DETECTED Final   Respiratory Syncytial Virus NOT DETECTED NOT DETECTED Final   Bordetella pertussis NOT DETECTED NOT DETECTED Final   Bordetella Parapertussis NOT DETECTED NOT DETECTED Final   Chlamydophila pneumoniae NOT DETECTED NOT DETECTED Final   Mycoplasma pneumoniae NOT DETECTED NOT DETECTED Final    Comment: Performed at [facility], [address] [place], [place] [address]  Culture, blood (Routine X 2) w Reflex to ID Panel     Status: None (Preliminary result)   Collection Time: 11/23/23  9:34 PM   Specimen: BLOOD  Result Value Ref Range Status   Specimen Description   Final    BLOOD BLOOD LEFT HAND Performed at [facility], [address], [place], [place] [address]    Special Requests   Final    BOTTLES DRAWN AEROBIC AND ANAEROBIC Blood Culture adequate volume Performed at [facility], [address] [place], [place] [address]    Culture  Setup Time   Final    GRAM NEGATIVE RODS AEROBIC BOTTLE ONLY Organism ID to follow CRITICAL RESULT CALLED TO, READ BACK BY AND VERIFIED WITH: TIFFANY GILCHRIST PHARMD 1051 11/24/23 HNM GRAM STAIN REVIEWED-AGREE WITH RESULT DRT Performed at [facility], [address], [place], [place] [address]    Culture GRAM NEGATIVE RODS  Final   Report  Status PENDING  Incomplete  Blood Culture ID Panel (Reflexed)     Status: Abnormal   Collection Time: 11/23/23  9:34 PM  Result Value Ref Range Status   Enterococcus faecalis NOT DETECTED NOT DETECTED Final   Enterococcus Faecium NOT DETECTED NOT DETECTED Final   Listeria monocytogenes NOT DETECTED NOT DETECTED Final   Staphylococcus species NOT DETECTED NOT DETECTED Final  Staphylococcus aureus (BCID) NOT DETECTED NOT DETECTED Final   Staphylococcus epidermidis NOT DETECTED NOT DETECTED Final   Staphylococcus lugdunensis NOT DETECTED NOT DETECTED Final   Streptococcus species NOT DETECTED NOT DETECTED Final   Streptococcus agalactiae NOT DETECTED NOT DETECTED Final   Streptococcus pneumoniae NOT DETECTED NOT DETECTED Final   Streptococcus pyogenes NOT DETECTED NOT DETECTED Final   A.calcoaceticus-baumannii NOT DETECTED NOT DETECTED Final   Bacteroides fragilis NOT DETECTED NOT DETECTED Final   Enterobacterales DETECTED (A) NOT DETECTED Final    Comment: Enterobacterales represent a large order of gram negative bacteria, not a single organism. CRITICAL RESULT CALLED TO, READ BACK BY AND VERIFIED WITH: TIFFANY GILCHRIST PHARMD 1051 11/24/23 HNM    Enterobacter cloacae complex NOT DETECTED NOT DETECTED Final   Escherichia coli DETECTED (A) NOT DETECTED Final    Comment: CRITICAL RESULT CALLED TO, READ BACK BY AND VERIFIED WITH: TIFFANY GILCHRIST PHARMD 1051 11/24/23 HNM    Klebsiella aerogenes NOT DETECTED NOT DETECTED Final   Klebsiella oxytoca NOT DETECTED NOT DETECTED Final   Klebsiella pneumoniae NOT DETECTED NOT DETECTED Final   Proteus species NOT DETECTED NOT DETECTED Final   Salmonella species NOT DETECTED NOT DETECTED Final   Serratia marcescens NOT DETECTED NOT DETECTED Final   Haemophilus influenzae NOT DETECTED NOT DETECTED Final   Neisseria meningitidis NOT DETECTED NOT DETECTED Final   Pseudomonas aeruginosa NOT DETECTED NOT DETECTED Final   Stenotrophomonas maltophilia  NOT DETECTED NOT DETECTED Final   Candida albicans NOT DETECTED NOT DETECTED Final   Candida auris NOT DETECTED NOT DETECTED Final   Candida glabrata NOT DETECTED NOT DETECTED Final   Candida krusei NOT DETECTED NOT DETECTED Final   Candida parapsilosis NOT DETECTED NOT DETECTED Final   Candida tropicalis NOT DETECTED NOT DETECTED Final   Cryptococcus neoformans/gattii NOT DETECTED NOT DETECTED Final   CTX-M ESBL NOT DETECTED NOT DETECTED Final   Carbapenem resistance IMP NOT DETECTED NOT DETECTED Final   Carbapenem resistance KPC NOT DETECTED NOT DETECTED Final   Carbapenem resistance NDM NOT DETECTED NOT DETECTED Final   Carbapenem resist OXA 48 LIKE NOT DETECTED NOT DETECTED Final   Carbapenem resistance VIM NOT DETECTED NOT DETECTED Final    Comment: Performed at [facility], [address], [place], [place] [address]     Radiology Studies: DG Chest 2 View Result Date: 11/23/2023 CLINICAL DATA:  78 year old female with chest pain. EXAM: CHEST - 2 VIEW COMPARISON:  Chest radiograph 09/15/2023 and earlier. FINDINGS: AP and lateral views 0900 hours. Lower lung volumes, especially on the lateral. Heart size and mediastinal contours stable and within normal limits. Visualized tracheal air column is within normal limits. Allowing for crowding of markings on the lateral, no pneumothorax, pleural effusion, pulmonary edema, or confluent lung opacity. Abdominal Calcified aortic atherosclerosis. Mild dextroconvex scoliosis. No acute osseous abnormality identified. Negative visible bowel gas. IMPRESSION: 1. Lower lung volumes. No acute cardiopulmonary abnormality. 2. Aortic Atherosclerosis (ICD10-I70.0). Electronically Signed   By: VEAR Hurst M.D.   On: 11/23/2023 09:32    Scheduled Meds:  amLODipine   10 mg Oral Daily   aspirin  EC  81 mg Oral Daily   carbamide peroxide  5 drop Right EAR BID   clopidogrel   75 mg Oral Q breakfast   dextrose   25 mL Intravenous Once   enoxaparin  (LOVENOX )  injection  40 mg Subcutaneous Q24H   guaiFENesin   600 mg Oral BID   insulin  aspart  0-5 Units Subcutaneous QHS   insulin  aspart  0-9  Units Subcutaneous TID WC   metoprolol  succinate  50 mg Oral Daily   pantoprazole   20 mg Oral BID   Continuous Infusions:  cefTRIAXone  (ROCEPHIN )  IV 2 g (11/24/23 1505)     LOS: 1 day  MDM: Patient is high risk for one or more organ failure.  They necessitate ongoing hospitalization for continued IV therapies and subsequent lab monitoring. Total time spent interpreting labs and vitals, reviewing the medical record, coordinating care amongst consultants and care team members, directly assessing and discussing care with the patient and/or family: 55 min  Holmes Hays, DO [facility]  To contact the attending physician between 7A-7P please use Epic Chat. To contact the covering physician during after hours 7P-7A, please review Amion.  11/25/2023, 8:13 AM   *This document has been created with the assistance of dictation software. Please excuse typographical errors. *

## 2023-11-26 ENCOUNTER — Inpatient Hospital Stay

## 2023-11-26 DIAGNOSIS — N390 Urinary tract infection, site not specified: Secondary | ICD-10-CM

## 2023-11-26 DIAGNOSIS — R7881 Bacteremia: Secondary | ICD-10-CM

## 2023-11-26 DIAGNOSIS — B961 Klebsiella pneumoniae [K. pneumoniae] as the cause of diseases classified elsewhere: Secondary | ICD-10-CM

## 2023-11-26 DIAGNOSIS — R531 Weakness: Secondary | ICD-10-CM | POA: Diagnosis not present

## 2023-11-26 DIAGNOSIS — Z87442 Personal history of urinary calculi: Secondary | ICD-10-CM

## 2023-11-26 DIAGNOSIS — B9620 Unspecified Escherichia coli [E. coli] as the cause of diseases classified elsewhere: Secondary | ICD-10-CM

## 2023-11-26 LAB — CULTURE, BLOOD (ROUTINE X 2): Special Requests: ADEQUATE

## 2023-11-26 LAB — GLUCOSE, CAPILLARY
Glucose-Capillary: 124 mg/dL — ABNORMAL HIGH (ref 70–99)
Glucose-Capillary: 207 mg/dL — ABNORMAL HIGH (ref 70–99)

## 2023-11-26 LAB — CBC
HCT: 31.3 % — ABNORMAL LOW (ref 36.0–46.0)
Hemoglobin: 11 g/dL — ABNORMAL LOW (ref 12.0–15.0)
MCH: 29.3 pg (ref 26.0–34.0)
MCHC: 35.1 g/dL (ref 30.0–36.0)
MCV: 83.2 fL (ref 80.0–100.0)
Platelets: 297 K/uL (ref 150–400)
RBC: 3.76 MIL/uL — ABNORMAL LOW (ref 3.87–5.11)
RDW: 14.5 % (ref 11.5–15.5)
WBC: 6.3 K/uL (ref 4.0–10.5)
nRBC: 0 % (ref 0.0–0.2)

## 2023-11-26 LAB — BASIC METABOLIC PANEL WITH GFR
Anion gap: 9 (ref 5–15)
BUN: 8 mg/dL (ref 8–23)
CO2: 26 mmol/L (ref 22–32)
Calcium: 8.5 mg/dL — ABNORMAL LOW (ref 8.9–10.3)
Chloride: 99 mmol/L (ref 98–111)
Creatinine, Ser: 0.51 mg/dL (ref 0.44–1.00)
GFR, Estimated: >60 mL/min (ref >60)
Glucose, Bld: 123 mg/dL — ABNORMAL HIGH (ref 70–99)
Potassium: 3.4 mmol/L — ABNORMAL LOW (ref 3.5–5.1)
Sodium: 134 mmol/L — ABNORMAL LOW (ref 135–145)

## 2023-11-26 LAB — URINE CULTURE: Culture: >=100000 — AB

## 2023-11-26 LAB — MAGNESIUM: Magnesium: 1.8 mg/dL (ref 1.7–2.4)

## 2023-11-26 MED ORDER — FERROUS SULFATE 325 (65 FE) MG PO TABS
325.0000 mg | ORAL_TABLET | Freq: Every day | ORAL | Status: AC
Start: 2023-11-27 — End: ?

## 2023-11-26 MED ORDER — PHENOL 1.4 % MT LIQD
1.0000 | OROMUCOSAL | Status: DC | PRN
  Administered 2023-11-26: 1 via OROMUCOSAL
  Filled 2023-11-26: qty 177

## 2023-11-26 MED ORDER — GUAIFENESIN ER 600 MG PO TB12: 600.0000 mg | ORAL_TABLET | Freq: Two times a day (BID) | ORAL | Status: DC

## 2023-11-26 MED ORDER — HYDROXYZINE HCL 10 MG/5ML PO SYRP
10.0000 mg | ORAL_SOLUTION | Freq: Three times a day (TID) | ORAL | Status: DC | PRN
  Administered 2023-11-26 (×2): 10 mg via ORAL
  Filled 2023-11-26 (×3): qty 5

## 2023-11-26 MED ORDER — ALPRAZOLAM 0.25 MG PO TABS: 0.2500 mg | ORAL_TABLET | Freq: Every day | ORAL | 0 refills | Status: AC

## 2023-11-26 MED ORDER — SODIUM CHLORIDE 0.9 % IV SOLN
1.0000 g | INTRAVENOUS | Status: DC
  Administered 2023-11-26: 1 g via INTRAVENOUS
  Filled 2023-11-26: qty 1000

## 2023-11-26 MED ORDER — CIPROFLOXACIN HCL 500 MG PO TABS: 500.0000 mg | ORAL_TABLET | Freq: Two times a day (BID) | ORAL | Status: AC

## 2023-11-26 NOTE — Progress Notes (Signed)
 PT Cancellation Note  Patient Details Name: Elaine Cox MRN: [id number] DOB: October 25, 1945   Cancelled Treatment:    Reason Eval/Treat Not Completed: Other (comment) (Patient eating lunch. She reports her daughter will be taking her to SNF for rehab shortly. PT will follow up tomorrow if patient does not discharge today as anticipated.)  Randine Essex, PT, MPT  Randine LULLA Essex 11/26/2023, 3:33 PM

## 2023-11-26 NOTE — Care Management Important Message (Signed)
 Important Message  Patient Details  Name: Elaine Cox MRN: [id number] Date of Birth: 1945-07-15   Important Message Given:        Elaine Cox 11/26/2023, 12:44 PM

## 2023-11-26 NOTE — Discharge Summary (Signed)
 DISCHARGE SUMMARY    Elaine Cox FMW:[id number] DOB: 20-Aug-1945 DOA: 11/23/2023  PCP: Sadie Manna, MD  Admit date: 11/23/2023 Discharge date: 11/26/2023   Recommendations for Outpatient Follow-up:  Follow up with PCP in 1-2 weeks to review chronic medication management. Referral to urology to discuss ongoing urinary frequency Referral to gastroenterology for colonoscopy given new iron deficiency anemia   Hospital Course: Elaine Cox is a 78 year old female with hypertension, diabetes, CAD, rheumatoid arthritis, breast cancer status postchemotherapy, prior CVA, anxiety, obesity, who presented with generalized weakness and inability to take care of herself.  On admission patient endorsed pan positive ROS with additional complaints of weakness and cough.  She was admitted for SNF placement.  Shortly after admission noted to have fever of 100.4.  Blood cultures were obtained which resulted positive for E. coli 1 out of 4.  Urine cultures positive for Klebsiella.  Infectious disease consultation was obtained.  We reviewed the case as well with infectious disease pharmacy.  Ultimately all agreed on plan for ciprofloxacin  for 7 additional days.  On 8/21 I discussed the care plan directly with the patient as well as with her daughter who are in agreement.  She is discharging today.  E. coli bacteremia Klebsiella UTI - 1 of 4 blood cultures positive - Denying urinary symptoms - UA: Small leuks, rare bacteria. -Renal ultrasound without hydronephrosis.  Postvoid residual <100 cc - Discussed with ID and ID Pharm, planning to discharge with ciprofloxacin  x 7 days.   Sepsis Criteria: Fever, tachycardia, source: Bacteremia and UTI   Weakness - No focal neurologic deficits - Continue PT/OT, discharging to SNF   Cough - No hypoxia, CXR without evidence of acute findings - RVP negative - As needed Robitussin - Suspect postnasal drip given patient's complaint of right ear  infection.  Continue with as needed saline spray  Recent ear infection - Was receiving antibiotic eardrops outpatient.  Ciprofloxacin  providing adequate coverage now.  Arthritis - Continue current medications as needed   Type 2 diabetes Hypoglycemia - Labile blood sugars - Continue with sensitive sliding scale, titrate daily as needed   CAD History of CVA - Continue home dose aspirin  and Plavix  - Continue statin at DC   Anxiety - On chronic benzodiazepines - Continue home dose Xanax  as needed   Hypokalemia Hyponatremia - Continue to replace as needed   Normocytic anemia - Hemoglobin 11.6, slightly lower than her baseline which appears to be closer to 14 - Denies any overt blood loss - TIBC low, iron low.  Initiate iron supplementation - Will refer to GI at discharge for consideration of colonoscopy   Urinary frequency - Patient reports this is chronic problem outside of this hospitalization.  Has requested referral to urology which is placed at discharge  Discharge Instructions  Discharge Instructions     Ambulatory referral to Gastroenterology   Complete by: As directed    New iron deficiency anemia.  Needs colonoscopy   What is the reason for referral?: Colonoscopy   Ambulatory referral to Urology   Complete by: As directed    Urinary frequency   Call MD for:  difficulty breathing, headache or visual disturbances   Complete by: As directed    Call MD for:  persistant dizziness or light-headedness   Complete by: As directed    Call MD for:  persistant nausea and vomiting   Complete by: As directed    Call MD for:  severe uncontrolled pain   Complete by: As directed  Call MD for:  temperature >100.4   Complete by: As directed    Diet general   Complete by: As directed    Discharge instructions   Complete by: As directed    Follow up with your primary care physician to discuss the medication changes during this admission   Increase activity slowly    Complete by: As directed       Allergies as of 11/26/2023       Reactions   Doxazosin Other (See Comments)   headache   Fish Allergy Anaphylaxis, Itching, Nausea Only, Swelling   She states her tongue turns black.   Dicloxacillin Nausea And Vomiting   Has patient had a PCN reaction causing immediate rash, facial/tongue/throat swelling, SOB or lightheadedness with hypotension: No Has patient had a PCN reaction causing severe rash involving mucus membranes or skin necrosis: No Has patient had a PCN reaction that required hospitalization: Unknown Has patient had a PCN reaction occurring within the last 10 years: Unknown If all of the above answers are NO, then may proceed with Cephalosporin use.   Iodine Hives   Per patient, she had a previous reaction/hives from an iodine injection   Sulfa Antibiotics Swelling   Loss of appetite   Constipation bloating Other reaction(s): Headache   Cardura [doxazosin Mesylate]    Headache    Etodolac Itching, Swelling   Sulfamethoxazole-trimethoprim Nausea Only   Zofran  [ondansetron ]    Azithromycin Itching   Ciprofloxacin  Nausea Only   Pt states she has never had a rash   Clopidogrel  Nausea And Vomiting   Colchicine Nausea Only, Other (See Comments)   Dizzness   Glucosamine Nausea And Vomiting, Other (See Comments)   Leflunomide Diarrhea, Itching   Meloxicam Itching   Methotrexate Nausea Only   Zoloft [sertraline] Nausea And Vomiting        Medication List     STOP taking these medications    acetaminophen -codeine 300-30 MG tablet Commonly known as: TYLENOL  #3   amoxicillin 875 MG tablet Commonly known as: AMOXIL   APPLE CIDER VINEGAR PO   cefdinir  300 MG capsule Commonly known as: OMNICEF    Enbrel SureClick 50 MG/ML injection Generic drug: etanercept   HYDROcodone -acetaminophen  5-325 MG tablet Commonly known as: NORCO/VICODIN   meclizine 25 MG tablet Commonly known as: ANTIVERT   multivitamin with minerals  tablet   Muscle Rub 10-15 % Crea   nitrofurantoin (macrocrystal-monohydrate) 100 MG capsule Commonly known as: MACROBID   Potassium Chloride  ER 20 MEQ Tbcr   predniSONE  5 MG tablet Commonly known as: DELTASONE    promethazine  12.5 MG tablet Commonly known as: PHENERGAN    rosuvastatin  10 MG tablet Commonly known as: CRESTOR    tiZANidine  4 MG tablet Commonly known as: ZANAFLEX    Voltaren 1 % Gel Generic drug: diclofenac Sodium       TAKE these medications    Accu-Chek Guide test strip Generic drug: glucose blood   acetaminophen  325 MG tablet Commonly known as: TYLENOL  Take 2 tablets (650 mg total) by mouth every 6 (six) hours as needed for mild pain (or Fever >/= 101). What changed: how much to take   Align 4 MG Caps Take 8 mg by mouth daily.   ALPRAZolam  0.25 MG tablet Commonly known as: XANAX  Take 1 tablet (0.25 mg total) by mouth at bedtime for 3 days.   amLODipine  10 MG tablet Commonly known as: NORVASC  Take 10 mg by mouth daily.   aspirin  EC 81 MG tablet Take 1 tablet (81 mg total) by  mouth daily. Swallow whole.   B-D SINGLE USE SWABS REGULAR Pads   Biotin 5000 MCG Tabs Take 10,000 mcg by mouth daily.   ciprofloxacin  500 MG tablet Commonly known as: Cipro  Take 1 tablet (500 mg total) by mouth 2 (two) times daily for 7 days.   clopidogrel  75 MG tablet Commonly known as: PLAVIX  Take 1 tablet (75 mg total) by mouth daily with breakfast.   clotrimazole 10 MG troche Commonly known as: MYCELEX Take 10 mg by mouth 3 (three) times daily.   D 1000 25 MCG (1000 UT) capsule Generic drug: Cholecalciferol Take 1,000 Units by mouth daily.   ferrous sulfate  325 (65 FE) MG tablet Take 1 tablet (325 mg total) by mouth daily with breakfast. Start taking on: November 27, 2023   glimepiride  4 MG tablet Commonly known as: AMARYL  Take 4 mg by mouth daily with breakfast.   guaiFENesin  600 MG 12 hr tablet Commonly known as: MUCINEX  Take 1 tablet (600 mg  total) by mouth 2 (two) times daily.   Lantus  SoloStar 100 UNIT/ML Solostar Pen Generic drug: insulin  glargine 15 Units at bedtime.   metoprolol  succinate 50 MG 24 hr tablet Commonly known as: TOPROL -XL Take 50 mg by mouth daily.   pantoprazole  20 MG tablet Commonly known as: PROTONIX  Take 1 tablet (20 mg total) by mouth 2 (two) times daily before meals Take 30 minutes before meals and other medications.   Restasis  0.05 % ophthalmic emulsion Generic drug: cycloSPORINE  Place 1 drop into both eyes daily as needed (Dry eyes).   True Metrix Level 1 Low Soln        Contact information for after-discharge care     Destination     [facility] .   Service: Skilled Nursing Contact information: [address] [place]  [address] [phone]                    Allergies  Allergen Reactions   Doxazosin Other (See Comments)    headache   Fish Allergy Anaphylaxis, Itching, Nausea Only and Swelling    She states her tongue turns black.    Dicloxacillin Nausea And Vomiting    Has patient had a PCN reaction causing immediate rash, facial/tongue/throat swelling, SOB or lightheadedness with hypotension: No Has patient had a PCN reaction causing severe rash involving mucus membranes or skin necrosis: No Has patient had a PCN reaction that required hospitalization: Unknown Has patient had a PCN reaction occurring within the last 10 years: Unknown If all of the above answers are NO, then may proceed with Cephalosporin use.   Iodine Hives    Per patient, she had a previous reaction/hives from an iodine injection   Sulfa Antibiotics Swelling    Loss of appetite   Constipation bloating Other reaction(s): Headache    Cardura [Doxazosin Mesylate]     Headache    Etodolac Itching and Swelling   Sulfamethoxazole-Trimethoprim Nausea Only   Zofran  [Ondansetron ]    Azithromycin Itching   Ciprofloxacin  Nausea Only    Pt states  she has never had a rash    Clopidogrel  Nausea And Vomiting   Colchicine Nausea Only and Other (See Comments)    Dizzness   Glucosamine Nausea And Vomiting and Other (See Comments)   Leflunomide Diarrhea and Itching   Meloxicam Itching   Methotrexate Nausea Only   Zoloft [Sertraline] Nausea And Vomiting    Consultations:    Procedures/Studies: US  RENAL Result Date: 11/26/2023 CLINICAL DATA:  6300 Hydronephrosis 6300 EXAM: RENAL / URINARY TRACT ULTRASOUND COMPLETE COMPARISON:  July 18, 2023, December 03, 2022 FINDINGS: Right Kidney: Renal measurements: 10.5 x 4.5 x 4.3 cm = volume: 104.3 mL.Normal echogenicity. No mass. No hydronephrosis or visualized nephrolithiasis. Left Kidney: Renal measurements: 10.9 x 4.8 x 5.2 cm = volume: 140.4 mL. Normal echogenicity. No mass. No hydronephrosis or visualized nephrolithiasis. Bladder: Circumferential wall thickening of the urinary bladder. Both ureteral jets present. Other: None. IMPRESSION: 1. No hydronephrosis or visualized nephrolithiasis. 2. Circumferential wall thickening of the urinary bladder, which may be due to underdistension. If there is concern for acute cystitis, correlation with urinalysis would be recommended. Electronically Signed   By: Rogelia Myers M.D.   On: 11/26/2023 12:37   DG Chest 2 View Result Date: 11/23/2023 CLINICAL DATA:  78 year old female with chest pain. EXAM: CHEST - 2 VIEW COMPARISON:  Chest radiograph 09/15/2023 and earlier. FINDINGS: AP and lateral views 0900 hours. Lower lung volumes, especially on the lateral. Heart size and mediastinal contours stable and within normal limits. Visualized tracheal air column is within normal limits. Allowing for crowding of markings on the lateral, no pneumothorax, pleural effusion, pulmonary edema, or confluent lung opacity. Abdominal Calcified aortic atherosclerosis. Mild dextroconvex scoliosis. No acute osseous abnormality identified. Negative visible bowel gas. IMPRESSION: 1.  Lower lung volumes. No acute cardiopulmonary abnormality. 2. Aortic Atherosclerosis (ICD10-I70.0). Electronically Signed   By: VEAR Hurst M.D.   On: 11/23/2023 09:32      Discharge Exam: Vitals:   11/26/23 0400 11/26/23 0852  BP: 138/65 132/67  Pulse: 76 72  Resp: 16 16  Temp: 97.8 F (36.6 C) 98.2 F (36.8 C)  SpO2: 96% 96%   Vitals:   11/25/23 1945 11/26/23 0400 11/26/23 0400 11/26/23 0852  BP: (!) 133/49 138/65 138/65 132/67  Pulse: 92 74 76 72  Resp: 20 16 16 16   Temp: (!) 100.6 F (38.1 C) 97.8 F (36.6 C) 97.8 F (36.6 C) 98.2 F (36.8 C)  TempSrc:      SpO2: 98% 98% 96% 96%  Weight:      Height:        Constitutional:  Normal appearance. Non toxic-appearing.  HENT: Head Normocephalic and atraumatic.  Mucous membranes are moist.  Eyes:  Extraocular intact. Conjunctivae normal.  Cardiovascular: Rate and Rhythm: Normal rate and regular rhythm.  Pulmonary: Non labored, symmetric rise of chest wall.  Skin: warm and dry. not jaundiced.  Neurological: No focal deficit present. alert. Oriented.  Psychiatric: Mood and Affect congruent.    The results of significant diagnostics from this hospitalization (including imaging, microbiology, ancillary and laboratory) are listed below for reference.     Microbiology: Recent Results (from the past 240 hours)  Culture, blood (Routine X 2) w Reflex to ID Panel     Status: None (Preliminary result)   Collection Time: 11/23/23  7:45 PM   Specimen: BLOOD  Result Value Ref Range Status   Specimen Description BLOOD BLOOD LEFT HAND  Final   Special Requests   Final    BOTTLES DRAWN AEROBIC ONLY Blood Culture adequate volume   Culture   Final    NO GROWTH 3 DAYS Performed at [facility], [address], [place], [place] [address]    Report Status PENDING  Incomplete  SARS Coronavirus 2 by RT PCR (hospital order, performed in [facility] hospital lab) *cepheid single result test* Anterior Nasal Swab     Status: None    Collection Time: 11/23/23  8:21 PM  Specimen: Anterior Nasal Swab  Result Value Ref Range Status   SARS Coronavirus 2 by RT PCR NEGATIVE NEGATIVE Final    Comment: (NOTE) SARS-CoV-2 target nucleic acids are NOT DETECTED.  The SARS-CoV-2 RNA is generally detectable in upper and lower respiratory specimens during the acute phase of infection. The lowest concentration of SARS-CoV-2 viral copies this assay can detect is 250 copies / mL. A negative result does not preclude SARS-CoV-2 infection and should not be used as the sole basis for treatment or other patient management decisions.  A negative result may occur with improper specimen collection / handling, submission of specimen other than nasopharyngeal swab, presence of viral mutation(s) within the areas targeted by this assay, and inadequate number of viral copies (<250 copies / mL). A negative result must be combined with clinical observations, patient history, and epidemiological information.  Fact Sheet for Patients:   RoadLapTop.co.za  Fact Sheet for Healthcare Providers: http://kim-miller.com/  This test is not yet approved or  cleared by the [place]  FDA and has been authorized for detection and/or diagnosis of SARS-CoV-2 by FDA under an Emergency Use Authorization (EUA).  This EUA will remain in effect (meaning this test can be used) for the duration of the COVID-19 declaration under Section 564(b)(1) of the Act, 21 U.S.C. section 360bbb-3(b)(1), unless the authorization is terminated or revoked sooner.  Performed at [facility], [address] [place], [place] [address]   Respiratory (~20 pathogens) panel by PCR     Status: None   Collection Time: 11/23/23  8:21 PM   Specimen: Nasopharyngeal Swab; Respiratory  Result Value Ref Range Status   Adenovirus NOT DETECTED NOT DETECTED Final   Coronavirus 229E NOT DETECTED NOT DETECTED Final    Comment:  (NOTE) The Coronavirus on the Respiratory Panel, DOES NOT test for the novel  Coronavirus (2019 nCoV)    Coronavirus HKU1 NOT DETECTED NOT DETECTED Final   Coronavirus NL63 NOT DETECTED NOT DETECTED Final   Coronavirus OC43 NOT DETECTED NOT DETECTED Final   Metapneumovirus NOT DETECTED NOT DETECTED Final   Rhinovirus / Enterovirus NOT DETECTED NOT DETECTED Final   Influenza A NOT DETECTED NOT DETECTED Final   Influenza B NOT DETECTED NOT DETECTED Final   Parainfluenza Virus 1 NOT DETECTED NOT DETECTED Final   Parainfluenza Virus 2 NOT DETECTED NOT DETECTED Final   Parainfluenza Virus 3 NOT DETECTED NOT DETECTED Final   Parainfluenza Virus 4 NOT DETECTED NOT DETECTED Final   Respiratory Syncytial Virus NOT DETECTED NOT DETECTED Final   Bordetella pertussis NOT DETECTED NOT DETECTED Final   Bordetella Parapertussis NOT DETECTED NOT DETECTED Final   Chlamydophila pneumoniae NOT DETECTED NOT DETECTED Final   Mycoplasma pneumoniae NOT DETECTED NOT DETECTED Final    Comment: Performed at [facility], [address] [place], [place] [address]  Culture, blood (Routine X 2) w Reflex to ID Panel     Status: Abnormal   Collection Time: 11/23/23  9:34 PM   Specimen: BLOOD  Result Value Ref Range Status   Specimen Description   Final    BLOOD BLOOD LEFT HAND Performed at [facility], [address], [place], [place] [address]    Special Requests   Final    BOTTLES DRAWN AEROBIC AND ANAEROBIC Blood Culture adequate volume Performed at [facility], [address] [place], [place] [address]    Culture  Setup Time   Final    GRAM NEGATIVE RODS AEROBIC BOTTLE ONLY CRITICAL RESULT CALLED  TO, READ BACK BY AND VERIFIED WITH: TIFFANY GILCHRIST PHARMD 1051 11/24/23 HNM    Culture ESCHERICHIA COLI (A)  Final   Report Status 11/26/2023 FINAL  Final   Organism ID, Bacteria ESCHERICHIA COLI  Final      Susceptibility   Escherichia coli - MIC*    AMPICILLIN <=2 SENSITIVE  Sensitive     CEFAZOLIN  (NON-URINE) <=1 SENSITIVE Sensitive     CEFEPIME <=0.12 SENSITIVE Sensitive     ERTAPENEM  <=0.12 SENSITIVE Sensitive     CEFTRIAXONE  <=0.25 SENSITIVE Sensitive     CIPROFLOXACIN  <=0.06 SENSITIVE Sensitive     GENTAMICIN <=1 SENSITIVE Sensitive     MEROPENEM <=0.25 SENSITIVE Sensitive     TRIMETH/SULFA <=20 SENSITIVE Sensitive     AMPICILLIN/SULBACTAM <=2 SENSITIVE Sensitive     PIP/TAZO Value in next row Sensitive ug/mL     <=4 SENSITIVEThis is a modified FDA-approved test that has been validated and its performance characteristics determined by the reporting laboratory.  This laboratory is certified under the Clinical Laboratory Improvement Amendments CLIA as qualified to perform high complexity clinical laboratory testing.    * ESCHERICHIA COLI  Blood Culture ID Panel (Reflexed)     Status: Abnormal   Collection Time: 11/23/23  9:34 PM  Result Value Ref Range Status   Enterococcus faecalis NOT DETECTED NOT DETECTED Final   Enterococcus Faecium NOT DETECTED NOT DETECTED Final   Listeria monocytogenes NOT DETECTED NOT DETECTED Final   Staphylococcus species NOT DETECTED NOT DETECTED Final   Staphylococcus aureus (BCID) NOT DETECTED NOT DETECTED Final   Staphylococcus epidermidis NOT DETECTED NOT DETECTED Final   Staphylococcus lugdunensis NOT DETECTED NOT DETECTED Final   Streptococcus species NOT DETECTED NOT DETECTED Final   Streptococcus agalactiae NOT DETECTED NOT DETECTED Final   Streptococcus pneumoniae NOT DETECTED NOT DETECTED Final   Streptococcus pyogenes NOT DETECTED NOT DETECTED Final   A.calcoaceticus-baumannii NOT DETECTED NOT DETECTED Final   Bacteroides fragilis NOT DETECTED NOT DETECTED Final   Enterobacterales DETECTED (A) NOT DETECTED Final    Comment: Enterobacterales represent a large order of gram negative bacteria, not a single organism. CRITICAL RESULT CALLED TO, READ BACK BY AND VERIFIED WITH: TIFFANY GILCHRIST PHARMD 1051 11/24/23 HNM     Enterobacter cloacae complex NOT DETECTED NOT DETECTED Final   Escherichia coli DETECTED (A) NOT DETECTED Final    Comment: CRITICAL RESULT CALLED TO, READ BACK BY AND VERIFIED WITH: TIFFANY GILCHRIST PHARMD 1051 11/24/23 HNM    Klebsiella aerogenes NOT DETECTED NOT DETECTED Final   Klebsiella oxytoca NOT DETECTED NOT DETECTED Final   Klebsiella pneumoniae NOT DETECTED NOT DETECTED Final   Proteus species NOT DETECTED NOT DETECTED Final   Salmonella species NOT DETECTED NOT DETECTED Final   Serratia marcescens NOT DETECTED NOT DETECTED Final   Haemophilus influenzae NOT DETECTED NOT DETECTED Final   Neisseria meningitidis NOT DETECTED NOT DETECTED Final   Pseudomonas aeruginosa NOT DETECTED NOT DETECTED Final   Stenotrophomonas maltophilia NOT DETECTED NOT DETECTED Final   Candida albicans NOT DETECTED NOT DETECTED Final   Candida auris NOT DETECTED NOT DETECTED Final   Candida glabrata NOT DETECTED NOT DETECTED Final   Candida krusei NOT DETECTED NOT DETECTED Final   Candida parapsilosis NOT DETECTED NOT DETECTED Final   Candida tropicalis NOT DETECTED NOT DETECTED Final   Cryptococcus neoformans/gattii NOT DETECTED NOT DETECTED Final   CTX-M ESBL NOT DETECTED NOT DETECTED Final   Carbapenem resistance IMP NOT DETECTED NOT DETECTED Final   Carbapenem resistance KPC NOT DETECTED NOT  DETECTED Final   Carbapenem resistance NDM NOT DETECTED NOT DETECTED Final   Carbapenem resist OXA 48 LIKE NOT DETECTED NOT DETECTED Final   Carbapenem resistance VIM NOT DETECTED NOT DETECTED Final    Comment: Performed at [facility], [address], [place], [place] [address]  Urine Culture (for pregnant, neutropenic or urologic patients or patients with an indwelling urinary catheter)     Status: Abnormal   Collection Time: 11/24/23  1:09 PM   Specimen: Urine, Clean Catch  Result Value Ref Range Status   Specimen Description   Final    URINE, CLEAN CATCH Performed at [facility], [address] [place], [place] [address]    Special Requests   Final    NONE Performed at [facility], [address] [place], [place] [address]    Culture >=100,000 COLONIES/mL KLEBSIELLA AEROGENES (A)  Final   Report Status 11/26/2023 FINAL  Final   Organism ID, Bacteria KLEBSIELLA AEROGENES (A)  Final      Susceptibility   Klebsiella aerogenes - MIC*    CEFEPIME 0.5 SENSITIVE Sensitive     CEFTRIAXONE  >=64 RESISTANT Resistant     CIPROFLOXACIN  <=0.06 SENSITIVE Sensitive     GENTAMICIN <=1 SENSITIVE Sensitive     NITROFURANTOIN 64 INTERMEDIATE Intermediate     TRIMETH/SULFA <=20 SENSITIVE Sensitive     PIP/TAZO Value in next row Resistant ug/mL     >=128 RESISTANTThis is a modified FDA-approved test that has been validated and its performance characteristics determined by the reporting laboratory.  This laboratory is certified under the Clinical Laboratory Improvement Amendments CLIA as qualified to perform high complexity clinical laboratory testing.    MEROPENEM Value in next row Sensitive      >=128 RESISTANTThis is a modified FDA-approved test that has been validated and its performance characteristics determined by the reporting laboratory.  This laboratory is certified under the Clinical Laboratory Improvement Amendments CLIA as qualified to perform high complexity clinical laboratory testing.    * >=100,000 COLONIES/mL KLEBSIELLA AEROGENES     Labs: BNP (last 3 results) Recent Labs    02/27/23 1147 04/02/23 1152  BNP 65.7 109.0*   Basic Metabolic Panel: Recent Labs  Lab 11/23/23 0945 11/24/23 0333 11/24/23 1616 11/25/23 0339 11/26/23 0456  NA 139  --  135 134* 134*  K 2.7* 3.5 3.6 3.2* 3.4*  CL 97*  --  102 100 99  CO2 29  --  26 25 26   GLUCOSE 76  --  63* 147* 123*  BUN 5*  --  8 8 8   CREATININE 0.58  --  0.77 0.43* 0.51  CALCIUM  8.8*  --  8.7* 8.6* 8.5*  MG 1.7 1.9 1.8 1.8 1.8   Liver Function Tests: Recent Labs  Lab 11/23/23 0945   AST 20  ALT 10  ALKPHOS 46  BILITOT 0.8  PROT 6.9  ALBUMIN 2.7*   No results for input(s): LIPASE, AMYLASE in the last 168 hours. No results for input(s): AMMONIA in the last 168 hours. CBC: Recent Labs  Lab 11/23/23 0945 11/24/23 1616 11/25/23 0339 11/26/23 0456  WBC 7.8 7.6 6.7 6.3  HGB 12.7 11.6* 11.5* 11.0*  HCT 35.0* 31.5* 31.9* 31.3*  MCV 81.4 81.4 82.6 83.2  PLT 298 280 280 297   Cardiac Enzymes: No results for input(s): CKTOTAL, CKMB, CKMBINDEX, TROPONINI in the last 168 hours. BNP: Invalid input(s): POCBNP CBG: Recent Labs  Lab 11/25/23 1126 11/25/23 1643 11/25/23 2124 11/26/23 0758 11/26/23 1230  GLUCAP 266* 170* 136* 124* 207*   D-Dimer No results for input(s): DDIMER in the last 72 hours. Hgb A1c No results for input(s): HGBA1C in the last 72 hours. Lipid Profile No results for input(s): CHOL, HDL, LDLCALC, TRIG, CHOLHDL, LDLDIRECT in the last 72 hours. Thyroid  function studies No results for input(s): TSH, T4TOTAL, T3FREE, THYROIDAB in the last 72 hours.  Invalid input(s): FREET3 Anemia work up Recent Labs    11/25/23 0835  VITAMINB12 1,281*  FOLATE 11.8  FERRITIN 119  TIBC 171*  IRON 27*  RETICCTPCT 1.7   Urinalysis    Component Value Date/Time   COLORURINE AMBER (A) 11/24/2023 1309   APPEARANCEUR CLOUDY (A) 11/24/2023 1309   APPEARANCEUR Hazy (A) 12/17/2022 1407   LABSPEC 1.023 11/24/2023 1309   PHURINE 6.0 11/24/2023 1309   GLUCOSEU 150 (A) 11/24/2023 1309   HGBUR NEGATIVE 11/24/2023 1309   BILIRUBINUR NEGATIVE 11/24/2023 1309   BILIRUBINUR Negative 12/17/2022 1407   KETONESUR NEGATIVE 11/24/2023 1309   PROTEINUR 30 (A) 11/24/2023 1309   NITRITE NEGATIVE 11/24/2023 1309   LEUKOCYTESUR SMALL (A) 11/24/2023 1309   Sepsis Labs Recent Labs  Lab 11/23/23 0945 11/24/23 1616 11/25/23 0339 11/26/23 0456  WBC 7.8 7.6 6.7 6.3   Microbiology Recent Results (from the past 240 hours)   Culture, blood (Routine X 2) w Reflex to ID Panel     Status: None (Preliminary result)   Collection Time: 11/23/23  7:45 PM   Specimen: BLOOD  Result Value Ref Range Status   Specimen Description BLOOD BLOOD LEFT HAND  Final   Special Requests   Final    BOTTLES DRAWN AEROBIC ONLY Blood Culture adequate volume   Culture   Final    NO GROWTH 3 DAYS Performed at [facility], [address], [place], [place] [address]    Report Status PENDING  Incomplete  SARS Coronavirus 2 by RT PCR (hospital order, performed in [facility] hospital lab) *cepheid single result test* Anterior Nasal Swab     Status: None   Collection Time: 11/23/23  8:21 PM   Specimen: Anterior Nasal Swab  Result Value Ref Range Status   SARS Coronavirus 2 by RT PCR NEGATIVE NEGATIVE Final    Comment: (NOTE) SARS-CoV-2 target nucleic acids are NOT DETECTED.  The SARS-CoV-2 RNA is generally detectable in upper and lower respiratory specimens during the acute phase of infection. The lowest concentration of SARS-CoV-2 viral copies this assay can detect is 250 copies / mL. A negative result does not preclude SARS-CoV-2 infection and should not be used as the sole basis for treatment or other patient management decisions.  A negative result may occur with improper specimen collection / handling, submission of specimen other than nasopharyngeal swab, presence of viral mutation(s) within the areas targeted by this assay, and inadequate number of viral copies (<250 copies / mL). A negative result must be combined with clinical observations, patient history, and epidemiological information.  Fact Sheet for Patients:   RoadLapTop.co.za  Fact Sheet for Healthcare Providers: http://kim-miller.com/  This test is not yet approved or  cleared by the [place]  FDA and has been authorized for detection and/or diagnosis of SARS-CoV-2 by FDA under an Emergency Use  Authorization (EUA).  This EUA will remain in effect (meaning this test can be used) for the duration of the COVID-19 declaration under Section 564(b)(1) of the Act, 21 U.S.C. section 360bbb-3(b)(1), unless the authorization is terminated or revoked sooner.  Performed at [facility], [address] [place], [place] [address]   Respiratory (~20 pathogens) panel by PCR     Status: None   Collection Time: 11/23/23  8:21 PM   Specimen: Nasopharyngeal Swab; Respiratory  Result Value Ref Range Status   Adenovirus NOT DETECTED NOT DETECTED Final   Coronavirus 229E NOT DETECTED NOT DETECTED Final    Comment: (NOTE) The Coronavirus on the Respiratory Panel, DOES NOT test for the novel  Coronavirus (2019 nCoV)    Coronavirus HKU1 NOT DETECTED NOT DETECTED Final   Coronavirus NL63 NOT DETECTED NOT DETECTED Final   Coronavirus OC43 NOT DETECTED NOT DETECTED Final   Metapneumovirus NOT DETECTED NOT DETECTED Final   Rhinovirus / Enterovirus NOT DETECTED NOT DETECTED Final   Influenza A NOT DETECTED NOT DETECTED Final   Influenza B NOT DETECTED NOT DETECTED Final   Parainfluenza Virus 1 NOT DETECTED NOT DETECTED Final   Parainfluenza Virus 2 NOT DETECTED NOT DETECTED Final   Parainfluenza Virus 3 NOT DETECTED NOT DETECTED Final   Parainfluenza Virus 4 NOT DETECTED NOT DETECTED Final   Respiratory Syncytial Virus NOT DETECTED NOT DETECTED Final   Bordetella pertussis NOT DETECTED NOT DETECTED Final   Bordetella Parapertussis NOT DETECTED NOT DETECTED Final   Chlamydophila pneumoniae NOT DETECTED NOT DETECTED Final   Mycoplasma pneumoniae NOT DETECTED NOT DETECTED Final    Comment: Performed at [facility], [address], [place], [place] [address]  Culture, blood (Routine X 2) w Reflex to ID Panel     Status: Abnormal   Collection Time: 11/23/23  9:34 PM   Specimen: BLOOD  Result Value Ref Range Status   Specimen Description   Final    BLOOD BLOOD LEFT HAND Performed at  [facility], [address], [place], [place] [address]    Special Requests   Final    BOTTLES DRAWN AEROBIC AND ANAEROBIC Blood Culture adequate volume Performed at [facility], [address], [place], [place] [address]    Culture  Setup Time   Final    GRAM NEGATIVE RODS AEROBIC BOTTLE ONLY CRITICAL RESULT CALLED TO, READ BACK BY AND VERIFIED WITH: TIFFANY GILCHRIST PHARMD 1051 11/24/23 HNM    Culture ESCHERICHIA COLI (A)  Final   Report Status 11/26/2023 FINAL  Final   Organism ID, Bacteria ESCHERICHIA COLI  Final      Susceptibility   Escherichia coli - MIC*    AMPICILLIN <=2 SENSITIVE Sensitive     CEFAZOLIN  (NON-URINE) <=1 SENSITIVE Sensitive     CEFEPIME <=0.12 SENSITIVE Sensitive     ERTAPENEM  <=0.12 SENSITIVE Sensitive     CEFTRIAXONE  <=0.25 SENSITIVE Sensitive     CIPROFLOXACIN  <=0.06 SENSITIVE Sensitive     GENTAMICIN <=1 SENSITIVE Sensitive     MEROPENEM <=0.25 SENSITIVE Sensitive     TRIMETH/SULFA <=20 SENSITIVE Sensitive     AMPICILLIN/SULBACTAM <=2 SENSITIVE Sensitive     PIP/TAZO Value in next row Sensitive ug/mL     <=4 SENSITIVEThis is a modified FDA-approved test that has been validated and its performance characteristics determined by the reporting laboratory.  This laboratory is certified under the Clinical Laboratory Improvement Amendments CLIA as qualified to perform high complexity clinical laboratory testing.    * ESCHERICHIA COLI  Blood Culture ID Panel (Reflexed)     Status: Abnormal   Collection Time: 11/23/23  9:34 PM  Result Value Ref Range Status   Enterococcus faecalis NOT DETECTED NOT DETECTED Final   Enterococcus Faecium NOT DETECTED NOT DETECTED Final   Listeria monocytogenes  NOT DETECTED NOT DETECTED Final   Staphylococcus species NOT DETECTED NOT DETECTED Final   Staphylococcus aureus (BCID) NOT DETECTED NOT DETECTED Final   Staphylococcus epidermidis NOT DETECTED NOT DETECTED Final   Staphylococcus lugdunensis NOT  DETECTED NOT DETECTED Final   Streptococcus species NOT DETECTED NOT DETECTED Final   Streptococcus agalactiae NOT DETECTED NOT DETECTED Final   Streptococcus pneumoniae NOT DETECTED NOT DETECTED Final   Streptococcus pyogenes NOT DETECTED NOT DETECTED Final   A.calcoaceticus-baumannii NOT DETECTED NOT DETECTED Final   Bacteroides fragilis NOT DETECTED NOT DETECTED Final   Enterobacterales DETECTED (A) NOT DETECTED Final    Comment: Enterobacterales represent a large order of gram negative bacteria, not a single organism. CRITICAL RESULT CALLED TO, READ BACK BY AND VERIFIED WITH: TIFFANY GILCHRIST PHARMD 1051 11/24/23 HNM    Enterobacter cloacae complex NOT DETECTED NOT DETECTED Final   Escherichia coli DETECTED (A) NOT DETECTED Final    Comment: CRITICAL RESULT CALLED TO, READ BACK BY AND VERIFIED WITH: TIFFANY GILCHRIST PHARMD 1051 11/24/23 HNM    Klebsiella aerogenes NOT DETECTED NOT DETECTED Final   Klebsiella oxytoca NOT DETECTED NOT DETECTED Final   Klebsiella pneumoniae NOT DETECTED NOT DETECTED Final   Proteus species NOT DETECTED NOT DETECTED Final   Salmonella species NOT DETECTED NOT DETECTED Final   Serratia marcescens NOT DETECTED NOT DETECTED Final   Haemophilus influenzae NOT DETECTED NOT DETECTED Final   Neisseria meningitidis NOT DETECTED NOT DETECTED Final   Pseudomonas aeruginosa NOT DETECTED NOT DETECTED Final   Stenotrophomonas maltophilia NOT DETECTED NOT DETECTED Final   Candida albicans NOT DETECTED NOT DETECTED Final   Candida auris NOT DETECTED NOT DETECTED Final   Candida glabrata NOT DETECTED NOT DETECTED Final   Candida krusei NOT DETECTED NOT DETECTED Final   Candida parapsilosis NOT DETECTED NOT DETECTED Final   Candida tropicalis NOT DETECTED NOT DETECTED Final   Cryptococcus neoformans/gattii NOT DETECTED NOT DETECTED Final   CTX-M ESBL NOT DETECTED NOT DETECTED Final   Carbapenem resistance IMP NOT DETECTED NOT DETECTED Final   Carbapenem resistance  KPC NOT DETECTED NOT DETECTED Final   Carbapenem resistance NDM NOT DETECTED NOT DETECTED Final   Carbapenem resist OXA 48 LIKE NOT DETECTED NOT DETECTED Final   Carbapenem resistance VIM NOT DETECTED NOT DETECTED Final    Comment: Performed at [facility], [address], [place], [place] [address]  Urine Culture (for pregnant, neutropenic or urologic patients or patients with an indwelling urinary catheter)     Status: Abnormal   Collection Time: 11/24/23  1:09 PM   Specimen: Urine, Clean Catch  Result Value Ref Range Status   Specimen Description   Final    URINE, CLEAN CATCH Performed at [facility], [address], [place], [place] [address]    Special Requests   Final    NONE Performed at [facility], [address] [place], [place] [address]    Culture >=100,000 COLONIES/mL KLEBSIELLA AEROGENES (A)  Final   Report Status 11/26/2023 FINAL  Final   Organism ID, Bacteria KLEBSIELLA AEROGENES (A)  Final      Susceptibility   Klebsiella aerogenes - MIC*    CEFEPIME 0.5 SENSITIVE Sensitive     CEFTRIAXONE  >=64 RESISTANT Resistant     CIPROFLOXACIN  <=0.06 SENSITIVE Sensitive     GENTAMICIN <=1 SENSITIVE Sensitive     NITROFURANTOIN 64 INTERMEDIATE Intermediate     TRIMETH/SULFA <=20 SENSITIVE Sensitive     PIP/TAZO Value in next row Resistant ug/mL     >=  128 RESISTANTThis is a modified FDA-approved test that has been validated and its performance characteristics determined by the reporting laboratory.  This laboratory is certified under the Clinical Laboratory Improvement Amendments CLIA as qualified to perform high complexity clinical laboratory testing.    MEROPENEM Value in next row Sensitive      >=128 RESISTANTThis is a modified FDA-approved test that has been validated and its performance characteristics determined by the reporting laboratory.  This laboratory is certified under the Clinical Laboratory Improvement Amendments CLIA as qualified to  perform high complexity clinical laboratory testing.    * >=100,000 COLONIES/mL KLEBSIELLA AEROGENES     Time coordinating discharge: 32 min   SIGNED: Kealii Thueson, DO [facility] 11/26/2023, 1:43 PM Pager   If 7PM-7AM, please contact night-coverage

## 2023-11-26 NOTE — TOC Progression Note (Signed)
 Transition of Care [facility]) - Progression Note    Patient Details  Name: Elaine Cox MRN: [id number] Date of Birth: 02/24/1946  [facility] CM/SW Contact  Asberry CHRISTELLA Jaksch, RN Phone Number: 11/26/2023, 9:46 AM  Clinical Narrative:     Per [place] with [facility], they are postponing all admissions due to a Covid outbreak. Physician, patient, and daughter Harlan) notified. Patient and daughter would like to accept bed at [facility]. Auth started.                    Expected Discharge Plan and Services                                               Social Drivers of Health (SDOH) Interventions SDOH Screenings   Food Insecurity: No Food Insecurity (11/23/2023)  Housing: Low Risk  (11/23/2023)  Transportation Needs: No Transportation Needs (11/23/2023)  Utilities: Not At Risk (11/23/2023)  Depression (PHQ2-9): Medium Risk (04/12/2018)  Financial Resource Strain: Low Risk  (10/27/2023)   Received from [facility]  Social Connections: Moderately Integrated (11/23/2023)  Tobacco Use: Low Risk  (11/23/2023)    Readmission Risk Interventions     No data to display

## 2023-11-26 NOTE — Care Management Important Message (Signed)
 Important Message  Patient Details  Name: Elaine Cox MRN: [id number] Date of Birth: 02/21/1946   Important Message Given:  Yes - Medicare IM     Rojelio [name] 11/26/2023, 12:45 PM

## 2023-11-26 NOTE — Consult Note (Signed)
 NAME: Elaine Cox  DOB: [birth year]  MRN: [id number]  Date/Time: 11/26/2023 8:49 AM  REQUESTING PROVIDER: Dr.Dezii Subjective:  REASON FOR CONSULT: Ecoli bacteremia ? Elaine Cox is a 78 y.o. with a history of rheumatoid arthritis, diabetes, rt TKA, rt mastectomy for ca breast, renal stones s/p lithotripsy Presented with weakness of 1 month and not able to care for herself at home- she lives with her daughter She also has cough X 2  months and has received 2 courses of antibiotics before- she c/o sinus issues She s had diarrhea for more than 2 months and was thought to be due to jardiance and it was stopped No diarrhea now She has urinary urge incontinence  at baseline but worse the past 2 months She is not taking any immune suppressive therapy for RA for the past 2 months Vitals in the ED  11/23/23 08:47  BP 159/118 (H)  Temp 98.8 F (37.1 C)  Pulse Rate 100  Resp 18  SpO2 98 %    Latest Reference Range & Units 11/23/23 09:45  WBC 4.0 - 10.5 K/uL 7.8  Hemoglobin 12.0 - 15.0 g/dL 87.2  HCT 63.9 - 53.9 % 35.0 (L)  Platelets 150 - 400 K/uL 298  Creatinine 0.44 - 1.00 mg/dL 9.41   Blood culture sent UA > 50 wbc  Urine culture sent I am aske dot see the patient for Ecoli in blood culture and UC growing MDR klebsiella  Pt is on ceftriaxone  but it is resistant  Past Medical History:  Diagnosis Date   Anginal pain (HCC) 06/2016   being followed by dr. bosie   Anxiety    Arthritis    Osteoarthritis   Arthritis    Rheumatoid   Breast cancer (HCC) 2000   Right Breast - Chemotherapy   Collagen vascular disease (HCC)    Rheumatoid Arthritis.   Coronary artery disease    Diabetes mellitus without complication [facility])    Patient takes Metformin    Dyspnea 03/2017   GERD (gastroesophageal reflux disease)    History of abdominal hysterectomy    History of eyelid surgery    History of kidney stones    Hyperlipidemia    Hypertension    Lumbago    Murmur    Myocardial  infarction (HCC) 07/2010   Orthopnea 03/2017   Personal history of chemotherapy 2000   right breast ca   Sinus disorder    Stroke [facility]) 2006, 2012   Wears dentures    full upper and lower    Past Surgical History:  Procedure Laterality Date   ABDOMINAL HYSTERECTOMY     BREAST SURGERY Right    mastectomy    CARDIAC CATHETERIZATION  2012   stent placed   CATARACT EXTRACTION W/PHACO Right 06/24/2017   Procedure: CATARACT EXTRACTION PHACO AND INTRAOCULAR LENS PLACEMENT (IOC) COMPLICATED RIGHT DIABETIC;  Surgeon: Mittie Gaskin, MD;  Location: [facility];  Service: Ophthalmology;  Laterality: Right;  Diabetes - oral med   COLONOSCOPY     COLONOSCOPY WITH PROPOFOL  N/A 12/08/2017   Procedure: COLONOSCOPY WITH PROPOFOL ;  Surgeon: Toledo, Ladell POUR, MD;  Location: [facility] ENDOSCOPY;  Service: Gastroenterology;  Laterality: N/A;   CORONARY ANGIOPLASTY     CORONARY STENT INTERVENTION N/A 02/24/2018   Procedure: CORONARY STENT INTERVENTION;  Surgeon: Ammon Blunt, MD;  Location: [facility] INVASIVE CV LAB;  Service: Cardiovascular;  Laterality: N/A;   CORONARY STENT INTERVENTION N/A 03/25/2023   Procedure: CORONARY STENT INTERVENTION;  Surgeon: Ammon Blunt, MD;  Location: [facility]  INVASIVE CV LAB;  Service: Cardiovascular;  Laterality: N/A;   CYSTOSCOPY N/A 09/08/2016   Procedure: CYSTOSCOPY;  Surgeon: Ward, Mitzie BROCKS, MD;  Location: [facility] ORS;  Service: Gynecology;  Laterality: N/A;   EYE SURGERY Left    Cataract Extraction with IOL   KNEE ARTHROPLASTY Right 03/14/2020   Procedure: COMPUTER ASSISTED TOTAL KNEE ARTHROPLASTY;  Surgeon: Mardee Lynwood SQUIBB, MD;  Location: [facility] ORS;  Service: Orthopedics;  Laterality: Right;  FOLLOWING 1ST CASE   KNEE ARTHROSCOPY W/ MENISCAL REPAIR Right    LAPAROSCOPIC BILATERAL SALPINGO OOPHERECTOMY Bilateral 09/08/2016   Procedure: LAPAROSCOPIC BILATERAL SALPINGO OOPHORECTOMY;  Surgeon: Ward, Mitzie BROCKS, MD;  Location: [facility] ORS;  Service: Gynecology;   Laterality: Bilateral;   LEFT HEART CATH AND CORONARY ANGIOGRAPHY Left 02/24/2018   Procedure: LEFT HEART CATH AND CORONARY ANGIOGRAPHY;  Surgeon: Ammon Blunt, MD;  Location: [facility] INVASIVE CV LAB;  Service: Cardiovascular;  Laterality: Left;   LEFT HEART CATH AND CORONARY ANGIOGRAPHY Left 03/25/2023   Procedure: LEFT HEART CATH AND CORONARY ANGIOGRAPHY;  Surgeon: Ammon Blunt, MD;  Location: [facility] INVASIVE CV LAB;  Service: Cardiovascular;  Laterality: Left;   MASTECTOMY Right 2000   BREAST CA    Social History   Socioeconomic History   Marital status: Widowed    Spouse name: Not on file   Number of children: Not on file   Years of education: Not on file   Highest education level: Not on file  Occupational History   Not on file  Tobacco Use   Smoking status: Never   Smokeless tobacco: Never  Vaping Use   Vaping status: Never Used  Substance and Sexual Activity   Alcohol  use: No   Drug use: No   Sexual activity: Not on file  Other Topics Concern   Not on file  Social History Narrative   Patient lives with adult daughter and son in  [place].   Social Drivers of Corporate investment banker Strain: Low Risk  (10/27/2023)   Received from [facility]   Overall Financial Resource Strain (CARDIA)    Difficulty of Paying Living Expenses: Not hard at all  Food Insecurity: No Food Insecurity (11/23/2023)   Hunger Vital Sign    Worried About Running Out of Food in the Last Year: Never true    Ran Out of Food in the Last Year: Never true  Transportation Needs: No Transportation Needs (11/23/2023)   PRAPARE - Administrator, Civil Service (Medical): No    Lack of Transportation (Non-Medical): No  Physical Activity: Not on file  Stress: Not on file  Social Connections: Moderately Integrated (11/23/2023)   Social Connection and Isolation Panel    Frequency of Communication with Friends and Family: More than three times a week    Frequency of  Social Gatherings with Friends and Family: More than three times a week    Attends Religious Services: More than 4 times per year    Active Member of [employer] or Organizations: No    Attends Engineer, structural: More than 4 times per year    Marital Status: Widowed  Intimate Partner Violence: Not At Risk (11/23/2023)   Humiliation, Afraid, Rape, and Kick questionnaire    Fear of Current or Ex-Partner: No    Emotionally Abused: No    Physically Abused: No    Sexually Abused: No    Family History  Problem Relation Age of Onset   Diabetes type II Mother    Hypertension Mother  Colon polyps Mother    Breast cancer Maternal Aunt    Allergies  Allergen Reactions   Doxazosin Other (See Comments)    headache   Fish Allergy Anaphylaxis, Itching, Nausea Only and Swelling    She states her tongue turns black.    Dicloxacillin Nausea And Vomiting    Has patient had a PCN reaction causing immediate rash, facial/tongue/throat swelling, SOB or lightheadedness with hypotension: No Has patient had a PCN reaction causing severe rash involving mucus membranes or skin necrosis: No Has patient had a PCN reaction that required hospitalization: Unknown Has patient had a PCN reaction occurring within the last 10 years: Unknown If all of the above answers are NO, then may proceed with Cephalosporin use.   Iodine Hives    Per patient, she had a previous reaction/hives from an iodine injection   Sulfa Antibiotics Swelling    Loss of appetite   Constipation bloating Other reaction(s): Headache    Cardura [Doxazosin Mesylate]     Headache    Etodolac Itching and Swelling   Sulfamethoxazole-Trimethoprim Nausea Only   Zofran  [Ondansetron ]    Azithromycin Itching   Ciprofloxacin  Nausea Only and Rash   Clopidogrel  Nausea And Vomiting   Colchicine Nausea Only and Other (See Comments)    Dizzness   Glucosamine Nausea And Vomiting and Other (See Comments)   Leflunomide Diarrhea and Itching    Meloxicam Itching   Methotrexate Nausea Only   Zoloft [Sertraline] Nausea And Vomiting   I? Current Facility-Administered Medications  Medication Dose Route Frequency Provider Last Rate Last Admin   acetaminophen  (TYLENOL ) tablet 1,000 mg  1,000 mg Oral TID PRN Awanda City, MD   1,000 mg at 11/26/23 0657   ALPRAZolam  (XANAX ) tablet 0.25 mg  0.25 mg Oral QHS PRN Awanda City, MD   0.25 mg at 11/25/23 2123   amLODipine  (NORVASC ) tablet 10 mg  10 mg Oral Daily Awanda City, MD   10 mg at 11/25/23 9173   aspirin  EC tablet 81 mg  81 mg Oral Daily Awanda City, MD   81 mg at 11/25/23 0826   carbamide peroxide (DEBROX) 6.5 % OTIC (EAR) solution 5 drop  5 drop Right EAR BID Awanda City, MD   5 drop at 11/25/23 2119   chlorpheniramine-HYDROcodone  (TUSSIONEX) 10-8 MG/5ML suspension 5 mL  5 mL Oral Q12H PRN Mansy, Jan A, MD   5 mL at 11/26/23 0216   clopidogrel  (PLAVIX ) tablet 75 mg  75 mg Oral Q breakfast Awanda City, MD   75 mg at 11/26/23 9165   dextrose  50 % solution 25 mL  25 mL Intravenous Once Mansy, Jan A, MD       enoxaparin  (LOVENOX ) injection 40 mg  40 mg Subcutaneous Q24H Awanda City, MD   40 mg at 11/25/23 2118   ertapenem  (INVANZ ) 1 g in sodium chloride  0.9 % 100 mL IVPB  1 g Intravenous Q24H Fayette Bodily, MD       ferrous sulfate  tablet 325 mg  325 mg Oral Q breakfast Dezii, Alexandra, DO   325 mg at 11/26/23 9165   fluticasone  (FLONASE ) 50 MCG/ACT nasal spray 1 spray  1 spray Each Nare Daily Dezii, Alexandra, DO   1 spray at 11/25/23 1137   guaiFENesin  (MUCINEX ) 12 hr tablet 600 mg  600 mg Oral BID Mansy, Jan A, MD   600 mg at 11/25/23 2118   hydrALAZINE  (APRESOLINE ) injection 10 mg  10 mg Intravenous Q6H PRN Awanda City, MD  hydrOXYzine  (ATARAX ) 10 MG/5ML syrup 10 mg  10 mg Oral TID PRN Mansy, Jan A, MD   10 mg at 11/26/23 9341   ibuprofen  (ADVIL ) tablet 400 mg  400 mg Oral Q6H PRN Awanda City, MD   400 mg at 11/26/23 9342   insulin  aspart (novoLOG ) injection 0-5 Units  0-5 Units  Subcutaneous QHS Awanda City, MD   2 Units at 11/24/23 2154   insulin  aspart (novoLOG ) injection 0-9 Units  0-9 Units Subcutaneous TID WC Awanda City, MD   1 Units at 11/26/23 9165   metoprolol  succinate (TOPROL -XL) 24 hr tablet 50 mg  50 mg Oral Daily Awanda City, MD   50 mg at 11/25/23 9173   pantoprazole  (PROTONIX ) EC tablet 20 mg  20 mg Oral BID Awanda City, MD   20 mg at 11/25/23 2118   phenol (CHLORASEPTIC) mouth spray 1 spray  1 spray Mouth/Throat PRN Dezii, Alexandra, DO   1 spray at 11/26/23 9340   polyethylene glycol (MIRALAX  / GLYCOLAX ) packet 17 g  17 g Oral BID PRN Awanda City, MD       prochlorperazine  (COMPAZINE ) injection 5 mg  5 mg Intravenous Q6H PRN Awanda City, MD   5 mg at 11/24/23 2353   sodium chloride  (OCEAN) 0.65 % nasal spray 1 spray  1 spray Each Nare PRN Dezii, Lorane, DO         Abtx:  Anti-infectives (From admission, onward)    Start     Dose/Rate Route Frequency Ordered Stop   11/26/23 0945  ertapenem  (INVANZ ) 1 g in sodium chloride  0.9 % 100 mL IVPB        1 g 200 mL/hr over 30 Minutes Intravenous Every 24 hours 11/26/23 0845     11/24/23 1345  cefTRIAXone  (ROCEPHIN ) 2 g in sodium chloride  0.9 % 100 mL IVPB  Status:  Discontinued        2 g 200 mL/hr over 30 Minutes Intravenous Daily 11/24/23 1257 11/26/23 0845       REVIEW OF SYSTEMS:  Const:  fever,  chills, negative weight loss Eyes: negative diplopia or visual changes, negative eye pain ENT: negative coryza, negative sore throat Resp: + cough, white sputum, dyspnea Cards: negative for chest pain, palpitations, lower extremity edema GU: as above- incontinence, no dysuria, no pain abdomen GI: Negative for abdominal pain, had diarrhea before  Skin: negative for rash and pruritus Heme: negative for easy bruising and gum/nose bleeding MS:  weakness Neurolo:negative for headaches, dizziness, vertigo, memory problems  Psych:  none Endocrine:  diabetes Allergy/Immunology: multiple allergies listed- most of  them are GI side effects Objective:  VITALS:  BP 138/65 (BP Location: Left Arm)   Pulse 76   Temp 97.8 F (36.6 C)   Resp 16   Ht 5' 2 (1.575 m)   Wt 77.6 kg   LMP  (LMP Unknown)   SpO2 96%   BMI 31.28 kg/m   PHYSICAL EXAM:  General: Alert, cooperative, no distress, appears stated age.  Head: Normocephalic, without obvious abnormality, atraumatic. Eyes: Conjunctivae clear, anicteric sclerae. Pupils are equal ENT Nares normal. No drainage or sinus tenderness. Tongue coated Neck: Supple, symmetrical, no adenopathy, thyroid : non tender no carotid bruit and no JVD. Back: No CVA tenderness. Lungs: b/l air entry Heart: irregular - well controlled Abdomen: Soft, non-tender,not distended. Bowel sounds normal. No masses Extremities: atraumatic, no cyanosis. No edema. No clubbing Skin: No rashes or lesions. Or bruising Lymph: Cervical, supraclavicular normal. Neurologic: Grossly non-focal Rt mastectomy Pertinent Labs  Lab Results CBC    Component Value Date/Time   WBC 6.3 11/26/2023 0456   RBC 3.76 (L) 11/26/2023 0456   HGB 11.0 (L) 11/26/2023 0456   HGB 12.5 05/18/2012 0933   HCT 31.3 (L) 11/26/2023 0456   HCT 35.6 05/18/2012 0933   PLT 297 11/26/2023 0456   PLT 252 05/18/2012 0933   MCV 83.2 11/26/2023 0456   MCV 80 05/18/2012 0933   MCH 29.3 11/26/2023 0456   MCHC 35.1 11/26/2023 0456   RDW 14.5 11/26/2023 0456   RDW 13.7 05/18/2012 0933   LYMPHSABS 1.3 09/21/2023 1117   MONOABS 0.7 09/21/2023 1117   EOSABS 0.2 09/21/2023 1117   BASOSABS 0.0 09/21/2023 1117       Latest Ref Rng & Units 11/26/2023    4:56 AM 11/25/2023    3:39 AM 11/24/2023    4:16 PM  CMP  Glucose 70 - 99 mg/dL 876  852  63   BUN 8 - 23 mg/dL 8  8  8    Creatinine 0.44 - 1.00 mg/dL 9.48  9.56  9.22   Sodium 135 - 145 mmol/L 134  134  135   Potassium 3.5 - 5.1 mmol/L 3.4  3.2  3.6   Chloride 98 - 111 mmol/L 99  100  102   CO2 22 - 32 mmol/L 26  25  26    Calcium  8.9 - 10.3 mg/dL 8.5  8.6  8.7        Microbiology: Recent Results (from the past 240 hours)  Culture, blood (Routine X 2) w Reflex to ID Panel     Status: None (Preliminary result)   Collection Time: 11/23/23  7:45 PM   Specimen: BLOOD  Result Value Ref Range Status   Specimen Description BLOOD BLOOD LEFT HAND  Final   Special Requests   Final    BOTTLES DRAWN AEROBIC ONLY Blood Culture adequate volume   Culture   Final    NO GROWTH 2 DAYS Performed at [facility], [address], [place], [place] [address]    Report Status PENDING  Incomplete  SARS Coronavirus 2 by RT PCR (hospital order, performed in [facility] hospital lab) *cepheid single result test* Anterior Nasal Swab     Status: None   Collection Time: 11/23/23  8:21 PM   Specimen: Anterior Nasal Swab  Result Value Ref Range Status   SARS Coronavirus 2 by RT PCR NEGATIVE NEGATIVE Final    Comment: (NOTE) SARS-CoV-2 target nucleic acids are NOT DETECTED.  The SARS-CoV-2 RNA is generally detectable in upper and lower respiratory specimens during the acute phase of infection. The lowest concentration of SARS-CoV-2 viral copies this assay can detect is 250 copies / mL. A negative result does not preclude SARS-CoV-2 infection and should not be used as the sole basis for treatment or other patient management decisions.  A negative result may occur with improper specimen collection / handling, submission of specimen other than nasopharyngeal swab, presence of viral mutation(s) within the areas targeted by this assay, and inadequate number of viral copies (<250 copies / mL). A negative result must be combined with clinical observations, patient history, and epidemiological information.  Fact Sheet for Patients:   RoadLapTop.co.za  Fact Sheet for Healthcare Providers: http://kim-miller.com/  This test is not yet approved or  cleared by the [place]  FDA and has been authorized for detection  and/or diagnosis of SARS-CoV-2 by FDA under an Emergency Use Authorization (EUA).  This EUA will remain in effect (meaning  this test can be used) for the duration of the COVID-19 declaration under Section 564(b)(1) of the Act, 21 U.S.C. section 360bbb-3(b)(1), unless the authorization is terminated or revoked sooner.  Performed at [facility], [address] [place], [place] [address]   Respiratory (~20 pathogens) panel by PCR     Status: None   Collection Time: 11/23/23  8:21 PM   Specimen: Nasopharyngeal Swab; Respiratory  Result Value Ref Range Status   Adenovirus NOT DETECTED NOT DETECTED Final   Coronavirus 229E NOT DETECTED NOT DETECTED Final    Comment: (NOTE) The Coronavirus on the Respiratory Panel, DOES NOT test for the novel  Coronavirus (2019 nCoV)    Coronavirus HKU1 NOT DETECTED NOT DETECTED Final   Coronavirus NL63 NOT DETECTED NOT DETECTED Final   Coronavirus OC43 NOT DETECTED NOT DETECTED Final   Metapneumovirus NOT DETECTED NOT DETECTED Final   Rhinovirus / Enterovirus NOT DETECTED NOT DETECTED Final   Influenza A NOT DETECTED NOT DETECTED Final   Influenza B NOT DETECTED NOT DETECTED Final   Parainfluenza Virus 1 NOT DETECTED NOT DETECTED Final   Parainfluenza Virus 2 NOT DETECTED NOT DETECTED Final   Parainfluenza Virus 3 NOT DETECTED NOT DETECTED Final   Parainfluenza Virus 4 NOT DETECTED NOT DETECTED Final   Respiratory Syncytial Virus NOT DETECTED NOT DETECTED Final   Bordetella pertussis NOT DETECTED NOT DETECTED Final   Bordetella Parapertussis NOT DETECTED NOT DETECTED Final   Chlamydophila pneumoniae NOT DETECTED NOT DETECTED Final   Mycoplasma pneumoniae NOT DETECTED NOT DETECTED Final    Comment: Performed at [facility], [address], [place], [place] [address]  Culture, blood (Routine X 2) w Reflex to ID Panel     Status: Abnormal (Preliminary result)   Collection Time: 11/23/23  9:34 PM   Specimen: BLOOD  Result Value Ref  Range Status   Specimen Description   Final    BLOOD BLOOD LEFT HAND Performed at [facility], [address], [place], [place] [address]    Special Requests   Final    BOTTLES DRAWN AEROBIC AND ANAEROBIC Blood Culture adequate volume Performed at [facility], [address] [place], [place] [address]    Culture  Setup Time   Final    GRAM NEGATIVE RODS AEROBIC BOTTLE ONLY CRITICAL RESULT CALLED TO, READ BACK BY AND VERIFIED WITH: TIFFANY GILCHRIST PHARMD 1051 11/24/23 HNM    Culture ESCHERICHIA COLI (A)  Final   Report Status PENDING  Incomplete   Organism ID, Bacteria ESCHERICHIA COLI  Final      Susceptibility   Escherichia coli - MIC*    AMPICILLIN <=2 SENSITIVE Sensitive     CEFAZOLIN  (NON-URINE) <=1 SENSITIVE Sensitive     CEFEPIME <=0.12 SENSITIVE Sensitive     ERTAPENEM  <=0.12 SENSITIVE Sensitive     CEFTRIAXONE  <=0.25 SENSITIVE Sensitive     CIPROFLOXACIN  <=0.06 SENSITIVE Sensitive     GENTAMICIN <=1 SENSITIVE Sensitive     MEROPENEM <=0.25 SENSITIVE Sensitive     TRIMETH/SULFA <=20 SENSITIVE Sensitive     AMPICILLIN/SULBACTAM <=2 SENSITIVE Sensitive     PIP/TAZO Value in next row Sensitive ug/mL     <=4 SENSITIVEThis is a modified FDA-approved test that has been validated and its performance characteristics determined by the reporting laboratory.  This laboratory is certified under the Clinical Laboratory Improvement Amendments CLIA as qualified to perform high complexity clinical laboratory testing.    * ESCHERICHIA COLI  Blood Culture ID Panel (Reflexed)  Status: Abnormal   Collection Time: 11/23/23  9:34 PM  Result Value Ref Range Status   Enterococcus faecalis NOT DETECTED NOT DETECTED Final   Enterococcus Faecium NOT DETECTED NOT DETECTED Final   Listeria monocytogenes NOT DETECTED NOT DETECTED Final   Staphylococcus species NOT DETECTED NOT DETECTED Final   Staphylococcus aureus (BCID) NOT DETECTED NOT DETECTED Final   Staphylococcus  epidermidis NOT DETECTED NOT DETECTED Final   Staphylococcus lugdunensis NOT DETECTED NOT DETECTED Final   Streptococcus species NOT DETECTED NOT DETECTED Final   Streptococcus agalactiae NOT DETECTED NOT DETECTED Final   Streptococcus pneumoniae NOT DETECTED NOT DETECTED Final   Streptococcus pyogenes NOT DETECTED NOT DETECTED Final   A.calcoaceticus-baumannii NOT DETECTED NOT DETECTED Final   Bacteroides fragilis NOT DETECTED NOT DETECTED Final   Enterobacterales DETECTED (A) NOT DETECTED Final    Comment: Enterobacterales represent a large order of gram negative bacteria, not a single organism. CRITICAL RESULT CALLED TO, READ BACK BY AND VERIFIED WITH: TIFFANY GILCHRIST PHARMD 1051 11/24/23 HNM    Enterobacter cloacae complex NOT DETECTED NOT DETECTED Final   Escherichia coli DETECTED (A) NOT DETECTED Final    Comment: CRITICAL RESULT CALLED TO, READ BACK BY AND VERIFIED WITH: TIFFANY GILCHRIST PHARMD 1051 11/24/23 HNM    Klebsiella aerogenes NOT DETECTED NOT DETECTED Final   Klebsiella oxytoca NOT DETECTED NOT DETECTED Final   Klebsiella pneumoniae NOT DETECTED NOT DETECTED Final   Proteus species NOT DETECTED NOT DETECTED Final   Salmonella species NOT DETECTED NOT DETECTED Final   Serratia marcescens NOT DETECTED NOT DETECTED Final   Haemophilus influenzae NOT DETECTED NOT DETECTED Final   Neisseria meningitidis NOT DETECTED NOT DETECTED Final   Pseudomonas aeruginosa NOT DETECTED NOT DETECTED Final   Stenotrophomonas maltophilia NOT DETECTED NOT DETECTED Final   Candida albicans NOT DETECTED NOT DETECTED Final   Candida auris NOT DETECTED NOT DETECTED Final   Candida glabrata NOT DETECTED NOT DETECTED Final   Candida krusei NOT DETECTED NOT DETECTED Final   Candida parapsilosis NOT DETECTED NOT DETECTED Final   Candida tropicalis NOT DETECTED NOT DETECTED Final   Cryptococcus neoformans/gattii NOT DETECTED NOT DETECTED Final   CTX-M ESBL NOT DETECTED NOT DETECTED Final    Carbapenem resistance IMP NOT DETECTED NOT DETECTED Final   Carbapenem resistance KPC NOT DETECTED NOT DETECTED Final   Carbapenem resistance NDM NOT DETECTED NOT DETECTED Final   Carbapenem resist OXA 48 LIKE NOT DETECTED NOT DETECTED Final   Carbapenem resistance VIM NOT DETECTED NOT DETECTED Final    Comment: Performed at [facility], [address] [place], [place] [address]  Urine Culture (for pregnant, neutropenic or urologic patients or patients with an indwelling urinary catheter)     Status: Abnormal   Collection Time: 11/24/23  1:09 PM   Specimen: Urine, Clean Catch  Result Value Ref Range Status   Specimen Description   Final    URINE, CLEAN CATCH Performed at [facility], [address] [place], [place] [address]    Special Requests   Final    NONE Performed at [facility], [address] [place], [place] [address]    Culture >=100,000 COLONIES/mL KLEBSIELLA AEROGENES (A)  Final   Report Status 11/26/2023 FINAL  Final   Organism ID, Bacteria KLEBSIELLA AEROGENES (A)  Final      Susceptibility   Klebsiella aerogenes - MIC*    CEFEPIME 0.5 SENSITIVE Sensitive     CEFTRIAXONE  >=64 RESISTANT Resistant     CIPROFLOXACIN  <=0.06  SENSITIVE Sensitive     GENTAMICIN <=1 SENSITIVE Sensitive     NITROFURANTOIN 64 INTERMEDIATE Intermediate     TRIMETH/SULFA <=20 SENSITIVE Sensitive     PIP/TAZO Value in next row Resistant ug/mL     >=128 RESISTANTThis is a modified FDA-approved test that has been validated and its performance characteristics determined by the reporting laboratory.  This laboratory is certified under the Clinical Laboratory Improvement Amendments CLIA as qualified to perform high complexity clinical laboratory testing.    MEROPENEM Value in next row Sensitive      >=128 RESISTANTThis is a modified FDA-approved test that has been validated and its performance characteristics determined by the reporting laboratory.  This laboratory is  certified under the Clinical Laboratory Improvement Amendments CLIA as qualified to perform high complexity clinical laboratory testing.    * >=100,000 COLONIES/mL KLEBSIELLA AEROGENES      IMAGING RESULTS: CXR N  I have personally reviewed the films ?No infiltrate  Impression/Recommendation Ecoli bacteremia likely due to UTI Klebsiella UTI- has h/o renal stones Will get post void bladder scan to look for residue and US  kidneys to look for hydronephrosis ? ?THe ecoli is a pan sensitive organism Where as the klebsiella is resistant to 2/3 cephalosporins- S to cipro  Dc ceftriaxone  and change to ertapenem   Please get Post void bladder scan to look for any residue  US  kidneys to look for hydro, pyelo  Both were done and N Pt is being sent to rehab She will go on PO cipro  500mg  BID for 7 days Cipro  was listed as allergy but she says it was just nausea and prefers to take it instead of IV The Antimicrobial pharmacist Dustin Zeigler spoke to the patient and explained side effects  H/o renal stones  Rh arthritis-pt was on TNFI, methtrexate in the past  but no meds in 2 months- followed by Dr.Patel   DM on insulin  here CAD with Stents  Rt mastectomy for ca breast  Discussed the management with patient and Dr.Dezii and pharmacist   _This consult involved complex antimicrobial management_______________________________________________ rNote:  This document was prepared using Dragon voice recognition software and may include unintentional dictation errors.

## 2023-11-26 NOTE — Plan of Care (Signed)
  Problem: Clinical Measurements: Goal: Diagnostic test results will improve Outcome: Adequate for Discharge   Problem: Education: Goal: Knowledge of General Education information will improve Description: Including pain rating scale, medication(s)/side effects and non-pharmacologic comfort measures Outcome: Adequate for Discharge   Problem: Health Behavior/Discharge Planning: Goal: Ability to manage health-related needs will improve Outcome: Adequate for Discharge   Problem: Clinical Measurements: Goal: Ability to maintain clinical measurements within normal limits will improve Outcome: Adequate for Discharge Goal: Will remain free from infection Outcome: Adequate for Discharge Goal: Diagnostic test results will improve Outcome: Adequate for Discharge Goal: Respiratory complications will improve Outcome: Adequate for Discharge Goal: Cardiovascular complication will be avoided Outcome: Adequate for Discharge   Problem: Activity: Goal: Risk for activity intolerance will decrease Outcome: Adequate for Discharge   Problem: Nutrition: Goal: Adequate nutrition will be maintained Outcome: Adequate for Discharge   Problem: Coping: Goal: Level of anxiety will decrease Outcome: Adequate for Discharge   Problem: Elimination: Goal: Will not experience complications related to bowel motility Outcome: Adequate for Discharge Goal: Will not experience complications related to urinary retention Outcome: Adequate for Discharge   Problem: Pain Managment: Goal: General experience of comfort will improve and/or be controlled Outcome: Adequate for Discharge   Problem: Safety: Goal: Ability to remain free from injury will improve Outcome: Adequate for Discharge   Problem: Skin Integrity: Goal: Risk for impaired skin integrity will decrease Outcome: Adequate for Discharge   Problem: Education: Goal: Ability to describe self-care measures that may prevent or decrease complications  (Diabetes Survival Skills Education) will improve Outcome: Adequate for Discharge Goal: Individualized Educational Video(s) Outcome: Adequate for Discharge   Problem: Coping: Goal: Ability to adjust to condition or change in health will improve Outcome: Adequate for Discharge   Problem: Fluid Volume: Goal: Ability to maintain a balanced intake and output will improve Outcome: Adequate for Discharge   Problem: Health Behavior/Discharge Planning: Goal: Ability to identify and utilize available resources and services will improve Outcome: Adequate for Discharge Goal: Ability to manage health-related needs will improve Outcome: Adequate for Discharge   Problem: Metabolic: Goal: Ability to maintain appropriate glucose levels will improve Outcome: Adequate for Discharge   Problem: Nutritional: Goal: Maintenance of adequate nutrition will improve Outcome: Adequate for Discharge Goal: Progress toward achieving an optimal weight will improve Outcome: Adequate for Discharge   Problem: Skin Integrity: Goal: Risk for impaired skin integrity will decrease Outcome: Adequate for Discharge   Problem: Tissue Perfusion: Goal: Adequacy of tissue perfusion will improve Outcome: Adequate for Discharge

## 2023-11-26 NOTE — TOC Transition Note (Signed)
 Transition of Care [facility]) - Discharge Note   Patient Details  Name: Elaine Cox MRN: [id number] Date of Birth: 03-15-1946  Transition of Care [facility]) CM/SW Contact:  Asberry CHRISTELLA Jaksch, RN Phone Number: 11/26/2023, 1:50 PM   Clinical Narrative:     Patient will DC to: [facility] Anticipated DC date: 11/26/2023 Family notified: Daughter Rexene Transport by: Deanie [facility]  Per MD patient ready for DC to [place]. RN, patient, patient's family, and facility notified of DC. Discharge Summary sent to facility. RN given number for report. DC packet on chart. Patient's daughter Rexene to provide DC transport.   TOC signing off.         Patient Goals and CMS Choice            Discharge Placement                       Discharge Plan and Services Additional resources added to the After Visit Summary for                                       Social Drivers of Health (SDOH) Interventions SDOH Screenings   Food Insecurity: No Food Insecurity (11/23/2023)  Housing: Low Risk  (11/23/2023)  Transportation Needs: No Transportation Needs (11/23/2023)  Utilities: Not At Risk (11/23/2023)  Depression (PHQ2-9): Medium Risk (04/12/2018)  Financial Resource Strain: Low Risk  (10/27/2023)   Received from [facility]  Social Connections: Moderately Integrated (11/23/2023)  Tobacco Use: Low Risk  (11/23/2023)     Readmission Risk Interventions     No data to display

## 2023-11-26 NOTE — Progress Notes (Signed)
 Mobility Specialist - Progress Note   11/26/23 1235  Mobility  Activity Ambulated with assistance  Level of Assistance Standby assist, set-up cues, supervision of patient - no hands on  Assistive Device Front wheel walker  Distance Ambulated (ft) 12 ft  Activity Response Tolerated well  Mobility visit 1 Mobility  Mobility Specialist Start Time (ACUTE ONLY) 1206  Mobility Specialist Stop Time (ACUTE ONLY) 1210  Mobility Specialist Time Calculation (min) (ACUTE ONLY) 4 min   Pt sitting on the commode upon entry, utilizing RA. Pt expressed continued pain in LUE and LLE  Pt STS to RW MinA, amb to the bed MinG-SBA-- fatigue and min SOB during amb. Pt left supine with alarm set and needs within reach. NT notified.  America Silvan Mobility Specialist 11/26/23 12:37 PM

## 2023-11-28 LAB — CULTURE, BLOOD (ROUTINE X 2)
Culture: NO GROWTH
Special Requests: ADEQUATE

## 2024-01-06 ENCOUNTER — Other Ambulatory Visit: Payer: Self-pay | Admitting: Otolaryngology

## 2024-01-06 DIAGNOSIS — R42 Dizziness and giddiness: Secondary | ICD-10-CM

## 2024-01-18 ENCOUNTER — Inpatient Hospital Stay
Admission: RE | Admit: 2024-01-18 | Discharge: 2024-01-18 | Disposition: A | Source: Ambulatory Visit | Attending: Otolaryngology | Admitting: Otolaryngology

## 2024-01-18 DIAGNOSIS — R42 Dizziness and giddiness: Secondary | ICD-10-CM

## 2024-01-18 MED ORDER — GADOPICLENOL 0.5 MMOL/ML IV SOLN
7.5000 mL | Freq: Once | INTRAVENOUS | Status: AC | PRN
  Administered 2024-01-18: 7.5 mL via INTRAVENOUS

## 2024-01-21 ENCOUNTER — Other Ambulatory Visit: Payer: Self-pay | Admitting: Internal Medicine

## 2024-01-21 DIAGNOSIS — Z1231 Encounter for screening mammogram for malignant neoplasm of breast: Secondary | ICD-10-CM

## 2024-02-23 ENCOUNTER — Other Ambulatory Visit: Payer: Self-pay | Admitting: Internal Medicine

## 2024-02-23 ENCOUNTER — Ambulatory Visit
Admission: RE | Admit: 2024-02-23 | Discharge: 2024-02-23 | Disposition: A | Discharge status: Home / Self Care | Source: Ambulatory Visit | Attending: Internal Medicine | Admitting: Internal Medicine

## 2024-02-23 DIAGNOSIS — Z1231 Encounter for screening mammogram for malignant neoplasm of breast: Secondary | ICD-10-CM | POA: Diagnosis present

## 2024-03-09 ENCOUNTER — Other Ambulatory Visit
Admission: RE | Admit: 2024-03-09 | Discharge: 2024-03-09 | Disposition: A | Discharge status: Home / Self Care | Source: Ambulatory Visit | Attending: Physician Assistant | Admitting: Physician Assistant

## 2024-03-09 DIAGNOSIS — R0789 Other chest pain: Secondary | ICD-10-CM | POA: Insufficient documentation

## 2024-03-09 DIAGNOSIS — K219 Gastro-esophageal reflux disease without esophagitis: Secondary | ICD-10-CM | POA: Diagnosis present

## 2024-03-09 LAB — TROPONIN T, HIGH SENSITIVITY: Troponin T High Sensitivity: <15 ng/L (ref 0–19)

## 2024-03-13 NOTE — Progress Notes (Deleted)
 03/13/24 7:38 PM   Elaine Cox 1945-12-16 [id number]  Referring provider:  Sadie Manna, MD [address] [facility] [place],  [place] [address]  Urological history  1. High risk hematuria - non-smoker - Non-contrast CT 08/2020 - non-obstructing tiny right renal calculi - cysto 08/2019 - NED - CTU (11/2022) - tiny right renal stones - cysto (12/2022) - NED - Urine cytology (12/2022) - negative  - RUS (11/2023) NED   2. Nephrolithiasis  - CT renal stone study 08/2020 - three right renal stone ~ 4 mm in size - KUB on 06/21/2021 visualized Stable multiple tiny right renal calculi. Stable vascular calcification overlying the upper pole of the left kidney or upper pole left renal staghorn type calculus.  - CT renal stone study on 07/03/2021 visualized multiple small nonobstructive right renal calculi. No left-sided calculi, ureteral calculi, or hydronephrosis - CTU (11/2022) - tiny right renal stones   3. Vaginal atrophy  - Managed on OTC vaginal lubricants   4. rUTI's -Contributing factors of age, vaginal atrophy, diabetes and constipation -Documented urine cultures over the last year - January 07, 2024 - <10,000 colonies - December 23, 2023 - staphylococcus epidermidis and mixed urogenital flora - November 24, 2023 - Klebsiella aerogenes  - November 23, 2023 - E.coli - September 29, 2023 - Klebsiella pneumoniae - May 19,2025 - E.coli - May 18, 2023 - lactobacillus species - May 01, 2023 - E.coli      No chief complaint on file.   HPI: Elaine Cox is a 78 y.o. woman who presents today for one year follow up.    Previous records reviewed.    They are having (1 to 7) or (8 or more) daytime voids,  they are having nocturia (1-2) or (3 or more) and urgency is (none, mild, strong, severe).   They are having (stress, urge or mixed incontinence.)    they are having urinary leakage (1-2 times weekly, 3 or more times weekly, 1-2 times daily and 3  or more times daily) They are using absorbent products for leakage (no, sometimes, always )   the type of products they use are (panty liners, absorbant pads, depends) *** daily.  They are not limiting fluids.  They are not engaging in toilet mapping  ***  UA ***  PVR ***  Serum creatinine (03/2024) 0.8  Hemoglobin A1c (09/2023) 9.0   PMH: Past Medical History:  Diagnosis Date   Anginal pain 06/2016   being followed by dr. bosie   Anxiety    Arthritis    Osteoarthritis   Arthritis    Rheumatoid   Breast cancer (HCC) 2000   Right Breast - Chemotherapy   Collagen vascular disease    Rheumatoid Arthritis.   Coronary artery disease    Diabetes mellitus without complication [facility])    Patient takes Metformin    Dyspnea 03/2017   GERD (gastroesophageal reflux disease)    History of abdominal hysterectomy    History of eyelid surgery    History of kidney stones    Hyperlipidemia    Hypertension    Lumbago    Murmur    Myocardial infarction [facility]) 07/2010   Orthopnea 03/2017   Personal history of chemotherapy 2000   right breast ca   Sinus disorder    Stroke [facility]) 2006, 2012   Wears dentures    full upper and lower    Surgical History: Past Surgical History:  Procedure Laterality Date   ABDOMINAL HYSTERECTOMY  BREAST SURGERY Right    mastectomy    CARDIAC CATHETERIZATION  2012   stent placed   CATARACT EXTRACTION W/PHACO Right 06/24/2017   Procedure: CATARACT EXTRACTION PHACO AND INTRAOCULAR LENS PLACEMENT (IOC) COMPLICATED RIGHT DIABETIC;  Surgeon: Mittie Gaskin, MD;  Location: [facility];  Service: Ophthalmology;  Laterality: Right;  Diabetes - oral med   COLONOSCOPY     COLONOSCOPY WITH PROPOFOL  N/A 12/08/2017   Procedure: COLONOSCOPY WITH PROPOFOL ;  Surgeon: Toledo, Ladell POUR, MD;  Location: [facility] ENDOSCOPY;  Service: Gastroenterology;  Laterality: N/A;   CORONARY ANGIOPLASTY     CORONARY STENT INTERVENTION N/A 02/24/2018   Procedure: CORONARY STENT  INTERVENTION;  Surgeon: Ammon Blunt, MD;  Location: [facility] INVASIVE CV LAB;  Service: Cardiovascular;  Laterality: N/A;   CORONARY STENT INTERVENTION N/A 03/25/2023   Procedure: CORONARY STENT INTERVENTION;  Surgeon: Ammon Blunt, MD;  Location: [facility] INVASIVE CV LAB;  Service: Cardiovascular;  Laterality: N/A;   CYSTOSCOPY N/A 09/08/2016   Procedure: CYSTOSCOPY;  Surgeon: Ward, Mitzie BROCKS, MD;  Location: [facility] ORS;  Service: Gynecology;  Laterality: N/A;   EYE SURGERY Left    Cataract Extraction with IOL   KNEE ARTHROPLASTY Right 03/14/2020   Procedure: COMPUTER ASSISTED TOTAL KNEE ARTHROPLASTY;  Surgeon: Mardee Lynwood SQUIBB, MD;  Location: [facility] ORS;  Service: Orthopedics;  Laterality: Right;  FOLLOWING 1ST CASE   KNEE ARTHROSCOPY W/ MENISCAL REPAIR Right    LAPAROSCOPIC BILATERAL SALPINGO OOPHERECTOMY Bilateral 09/08/2016   Procedure: LAPAROSCOPIC BILATERAL SALPINGO OOPHORECTOMY;  Surgeon: Ward, Mitzie BROCKS, MD;  Location: [facility] ORS;  Service: Gynecology;  Laterality: Bilateral;   LEFT HEART CATH AND CORONARY ANGIOGRAPHY Left 02/24/2018   Procedure: LEFT HEART CATH AND CORONARY ANGIOGRAPHY;  Surgeon: Ammon Blunt, MD;  Location: [facility] INVASIVE CV LAB;  Service: Cardiovascular;  Laterality: Left;   LEFT HEART CATH AND CORONARY ANGIOGRAPHY Left 03/25/2023   Procedure: LEFT HEART CATH AND CORONARY ANGIOGRAPHY;  Surgeon: Ammon Blunt, MD;  Location: [facility] INVASIVE CV LAB;  Service: Cardiovascular;  Laterality: Left;   MASTECTOMY Right 2000   BREAST CA    Home Medications:  Allergies as of 03/17/2024       Reactions   Doxazosin Other (See Comments)   headache   Fish Allergy Anaphylaxis, Itching, Nausea Only, Swelling   She states her tongue turns black.   Dicloxacillin Nausea And Vomiting   Has patient had a PCN reaction causing immediate rash, facial/tongue/throat swelling, SOB or lightheadedness with hypotension: No Has patient had a PCN reaction causing severe rash  involving mucus membranes or skin necrosis: No Has patient had a PCN reaction that required hospitalization: Unknown Has patient had a PCN reaction occurring within the last 10 years: Unknown If all of the above answers are NO, then may proceed with Cephalosporin use.   Iodine Hives   Per patient, she had a previous reaction/hives from an iodine injection   Sulfa Antibiotics Swelling   Loss of appetite   Constipation bloating Other reaction(s): Headache   Cardura [doxazosin Mesylate]    Headache    Etodolac Itching, Swelling   Sulfamethoxazole-trimethoprim Nausea Only   Zofran  [ondansetron ]    Azithromycin Itching   Ciprofloxacin  Nausea Only   Pt states she has never had a rash   Clopidogrel  Nausea And Vomiting   Colchicine Nausea Only, Other (See Comments)   Dizzness   Glucosamine Nausea And Vomiting, Other (See Comments)   Leflunomide Diarrhea, Itching   Meloxicam Itching   Methotrexate Nausea Only   Zoloft [sertraline] Nausea And Vomiting  Medication List        Accurate as of March 13, 2024  7:38 PM. If you have any questions, ask your nurse or doctor.          Accu-Chek Guide test strip Generic drug: glucose blood   acetaminophen  325 MG tablet Commonly known as: TYLENOL  Take 2 tablets (650 mg total) by mouth every 6 (six) hours as needed for mild pain (or Fever >/= 101). What changed: how much to take   Align 4 MG Caps Take 8 mg by mouth daily.   amLODipine  10 MG tablet Commonly known as: NORVASC  Take 10 mg by mouth daily.   aspirin  EC 81 MG tablet Take 1 tablet (81 mg total) by mouth daily. Swallow whole.   B-D SINGLE USE SWABS REGULAR Pads   Biotin 5000 MCG Tabs Take 10,000 mcg by mouth daily.   clopidogrel  75 MG tablet Commonly known as: PLAVIX  Take 1 tablet (75 mg total) by mouth daily with breakfast.   clotrimazole 10 MG troche Commonly known as: MYCELEX Take 10 mg by mouth 3 (three) times daily.   D 1000 25 MCG (1000 UT)  capsule Generic drug: Cholecalciferol Take 1,000 Units by mouth daily.   ferrous sulfate  325 (65 FE) MG tablet Take 1 tablet (325 mg total) by mouth daily with breakfast.   glimepiride  4 MG tablet Commonly known as: AMARYL  Take 4 mg by mouth daily with breakfast.   guaiFENesin  600 MG 12 hr tablet Commonly known as: MUCINEX  Take 1 tablet (600 mg total) by mouth 2 (two) times daily.   Lantus  SoloStar 100 UNIT/ML Solostar Pen Generic drug: insulin  glargine 15 Units at bedtime.   metoprolol  succinate 50 MG 24 hr tablet Commonly known as: TOPROL -XL Take 50 mg by mouth daily.   pantoprazole  20 MG tablet Commonly known as: PROTONIX  Take 1 tablet (20 mg total) by mouth 2 (two) times daily before meals Take 30 minutes before meals and other medications.   Restasis  0.05 % ophthalmic emulsion Generic drug: cycloSPORINE  Place 1 drop into both eyes daily as needed (Dry eyes).   rosuvastatin  10 MG tablet Commonly known as: CRESTOR  Take 10 mg by mouth daily.   True Metrix Level 1 Low Soln        Allergies:  Allergies  Allergen Reactions   Doxazosin Other (See Comments)    headache   Fish Allergy Anaphylaxis, Itching, Nausea Only and Swelling    She states her tongue turns black.    Dicloxacillin Nausea And Vomiting    Has patient had a PCN reaction causing immediate rash, facial/tongue/throat swelling, SOB or lightheadedness with hypotension: No Has patient had a PCN reaction causing severe rash involving mucus membranes or skin necrosis: No Has patient had a PCN reaction that required hospitalization: Unknown Has patient had a PCN reaction occurring within the last 10 years: Unknown If all of the above answers are NO, then may proceed with Cephalosporin use.   Iodine Hives    Per patient, she had a previous reaction/hives from an iodine injection   Sulfa Antibiotics Swelling    Loss of appetite   Constipation bloating Other reaction(s): Headache    Cardura  [Doxazosin Mesylate]     Headache    Etodolac Itching and Swelling   Sulfamethoxazole-Trimethoprim Nausea Only   Zofran  [Ondansetron ]    Azithromycin Itching   Ciprofloxacin  Nausea Only    Pt states she has never had a rash    Clopidogrel  Nausea And Vomiting   Colchicine Nausea Only  and Other (See Comments)    Dizzness   Glucosamine Nausea And Vomiting and Other (See Comments)   Leflunomide Diarrhea and Itching   Meloxicam Itching   Methotrexate Nausea Only   Zoloft [Sertraline] Nausea And Vomiting    Family History: Family History  Problem Relation Age of Onset   Diabetes type II Mother    Hypertension Mother    Colon polyps Mother    Breast cancer Maternal Aunt     Social History:  reports that she has never smoked. She has never used smokeless tobacco. She reports that she does not drink alcohol  and does not use drugs.   Physical Exam: LMP  (LMP Unknown)   Constitutional:  Well nourished. Alert and oriented, No acute distress. HEENT: [place] AT, moist mucus membranes.  Trachea midline, no masses. Cardiovascular: No clubbing, cyanosis, or edema. Respiratory: Normal respiratory effort, no increased work of breathing. GU: No CVA tenderness.  No bladder fullness or masses.  Recession of labia minora, dry, pale vulvar vaginal mucosa and loss of mucosal ridges and folds.  Normal urethral meatus, no lesions, no prolapse, no discharge.   No urethral masses, tenderness and/or tenderness. No bladder fullness, tenderness or masses. *** vagina mucosa, *** estrogen effect, no discharge, no lesions, *** pelvic support, *** cystocele and *** rectocele noted.  No cervical motion tenderness.  Uterus is freely mobile and non-fixed.  No adnexal/parametria masses or tenderness noted.  Anus and perineum are without rashes or lesions.   ***  Neurologic: Grossly intact, no focal deficits, moving all 4 extremities. Psychiatric: Normal mood and affect.    Laboratory Data: See Epic and HPI   I have  reviewed the labs.  See HPI.      Pertinent Imaging  ***  Assessment & Plan:    Right renal stones - CT urogram with tiny right renal stones, no hydro -asymptomatic  2. rUTI's - continue OTC preventative strategies    3. High risk hematuria - work up in May 2021 - bilateral nephrolithiasis - work up in Oct 2024 - tiny right renal stones  4. Urge incontinence -Mild and not bothersome  No follow-ups on file.  [facility] [address] [place], [place] [address] [phone]

## 2024-03-17 ENCOUNTER — Ambulatory Visit: Payer: Self-pay | Admitting: Urology

## 2024-03-17 DIAGNOSIS — N3941 Urge incontinence: Secondary | ICD-10-CM

## 2024-03-17 DIAGNOSIS — R319 Hematuria, unspecified: Secondary | ICD-10-CM

## 2024-03-17 DIAGNOSIS — N200 Calculus of kidney: Secondary | ICD-10-CM

## 2024-03-17 DIAGNOSIS — N390 Urinary tract infection, site not specified: Secondary | ICD-10-CM

## 2024-03-17 NOTE — Progress Notes (Signed)
 03/24/2024 8:47 AM   Elaine Cox 1945-08-20 [id number]  Referring provider:  Sadie Manna, MD [address] [facility] [place],  [place] [address]  Urological history  1. High risk hematuria - non-smoker - Non-contrast CT 08/2020 - non-obstructing tiny right renal calculi - cysto 08/2019 - NED - CTU (11/2022) - tiny right renal stones - cysto (12/2022) - NED - Urine cytology (12/2022) - negative  - RUS (11/2023) NED   2. Nephrolithiasis  - CT renal stone study 08/2020 - three right renal stone ~ 4 mm in size - KUB on 06/21/2021 visualized Stable multiple tiny right renal calculi. Stable vascular calcification overlying the upper pole of the left kidney or upper pole left renal staghorn type calculus.  - CT renal stone study on 07/03/2021 visualized multiple small nonobstructive right renal calculi. No left-sided calculi, ureteral calculi, or hydronephrosis - CTU (11/2022) - tiny right renal stones   3. Vaginal atrophy  - Managed on OTC vaginal lubricants   4. rUTI's -Contributing factors of age, vaginal atrophy, diabetes and constipation -Documented urine cultures over the last year - January 07, 2024 - <10,000 colonies - December 23, 2023 - staphylococcus epidermidis and mixed urogenital flora - November 24, 2023 - Klebsiella aerogenes  - November 23, 2023 - E.coli - September 29, 2023 - Klebsiella pneumoniae - May 19,2025 - E.coli - May 18, 2023 - lactobacillus species - May 01, 2023 - E.coli      Chief Complaint  Patient presents with   Over Active Bladder    HPI: Elaine Cox is a 78 y.o. woman who presents today for one year follow up.    Previous records reviewed.    She is having 1-7 daytime voids, 3 or more episodes of nocturia with a mild urge to urinate.  She has urge incontinence.  She leaks 1-2 times a week.  She wears depends daily.  She does not limit fluid intake.  She sometimes engage in toilet mapping.  Patient denies  any modifying or aggravating factors.  Patient denies any recent UTI's, gross hematuria, dysuria or suprapubic/flank pain.  Patient denies any fevers, chills, nausea or vomiting.    UA yellow clear, specific gravity 1.010, pH 6.0, trace leukocyte, 0-5 WBCs, 0-2 RBCs, greater than 10 epithelial cells, amorphous sediment present, mucus threads present and moderate bacteria.  PVR 0 mL  Serum creatinine (03/2024) 0.8  Hemoglobin A1c (09/2023) 9.0   PMH: Past Medical History:  Diagnosis Date   Anginal pain 06/2016   being followed by dr. bosie   Anxiety    Arthritis    Osteoarthritis   Arthritis    Rheumatoid   Breast cancer (HCC) 2000   Right Breast - Chemotherapy   Collagen vascular disease    Rheumatoid Arthritis.   Coronary artery disease    Diabetes mellitus without complication [facility])    Patient takes Metformin    Dyspnea 03/2017   GERD (gastroesophageal reflux disease)    History of abdominal hysterectomy    History of eyelid surgery    History of kidney stones    Hyperlipidemia    Hypertension    Lumbago    Murmur    Myocardial infarction [facility]) 07/2010   Orthopnea 03/2017   Personal history of chemotherapy 2000   right breast ca   Sinus disorder    Stroke [facility]) 2006, 2012   Wears dentures    full upper and lower    Surgical History: Past Surgical History:  Procedure Laterality  Date   ABDOMINAL HYSTERECTOMY     BREAST SURGERY Right    mastectomy    CARDIAC CATHETERIZATION  2012   stent placed   CATARACT EXTRACTION W/PHACO Right 06/24/2017   Procedure: CATARACT EXTRACTION PHACO AND INTRAOCULAR LENS PLACEMENT (IOC) COMPLICATED RIGHT DIABETIC;  Surgeon: Mittie Gaskin, MD;  Location: [facility];  Service: Ophthalmology;  Laterality: Right;  Diabetes - oral med   COLONOSCOPY     COLONOSCOPY WITH PROPOFOL  N/A 12/08/2017   Procedure: COLONOSCOPY WITH PROPOFOL ;  Surgeon: Toledo, Ladell POUR, MD;  Location: [facility] END[facility]COPY;  Service: Gastroenterology;   Laterality: N/A;   CORONARY ANGIOPLASTY     CORONARY STENT INTERVENTION N/A 02/24/2018   Procedure: CORONARY STENT INTERVENTION;  Surgeon: Ammon Blunt, MD;  Location: [facility];  Service: Cardiovascular;  Laterality: N/A;   CORONARY STENT INTERVENTION N/A 03/25/2023   Procedure: CORONARY STENT INTERVENTION;  Surgeon: Ammon Blunt, MD;  Location: [facility];  Service: Cardiovascular;  Laterality: N/A;   CYSTOSCOPY N/A 09/08/2016   Procedure: CYSTOSCOPY;  Surgeon: Ward, Mitzie BROCKS, MD;  Location: [facility] ORS;  Service: Gynecology;  Laterality: N/A;   EYE SURGERY Left    Cataract Extraction with IOL   KNEE ARTHROPLASTY Right 03/14/2020   Procedure: COMPUTER ASSISTED TOTAL KNEE ARTHROPLASTY;  Surgeon: Mardee Lynwood SQUIBB, MD;  Location: [facility] ORS;  Service: Orthopedics;  Laterality: Right;  FOLLOWING 1ST CASE   KNEE ARTHROSCOPY W/ MENISCAL REPAIR Right    LAPAROSCOPIC BILATERAL SALPINGO OOPHERECTOMY Bilateral 09/08/2016   Procedure: LAPAROSCOPIC BILATERAL SALPINGO OOPHORECTOMY;  Surgeon: Ward, Mitzie BROCKS, MD;  Location: [facility] OR[facility];  Service: Gynecology;  Laterality: Bilateral;   LEFT HEART CATH AND CORONARY ANGIOGRAPHY Left 02/24/2018   Procedure: LEFT HEART CATH AND CORONARY ANGIOGRAPHY;  Surgeon: Ammon Blunt, MD;  Location: [facility];  Service: Cardiovascular;  Laterality: Left;   LEFT HEART CATH AND CORONARY ANGIOGRAPHY Left 03/25/2023   Procedure: LEFT HEART CATH AND CORONARY ANGIOGRAPHY;  Surgeon: Ammon Blunt, MD;  Location: [facility];  Service: Cardiovascular;  Laterality: Left;   MASTECTOMY Right 2000   BREAST CA    Home Medications:  Allergies as of 03/24/2024       Reactions   Doxazosin Other (See Comments)   headache   Fish Allergy Anaphylaxis, Itching, Nausea Only, Swelling   She states her tongue turns black.   Dicloxacillin Nausea And Vomiting   Has patient had a PCN reaction causing immediate rash,  facial/tongue/throat swelling, SOB or lightheadedness with hypotension: No Has patient had a PCN reaction causing severe rash involving mucus membranes or skin necrosis: No Has patient had a PCN reaction that required hospitalization: Unknown Has patient had a PCN reaction occurring within the last 10 years: Unknown If all of the above answers are NO, then may proceed with Cephalosporin use.   Iodine Hives   Per patient, she had a previous reaction/hives from an iodine injection   Sulfa Antibiotics Swelling   Loss of appetite   Constipation bloating Other reaction(s): Headache   Cardura [doxazosin Mesylate]    Headache    Etodolac Itching, Swelling   Sulfamethoxazole-trimethoprim Nausea Only   Zofran  [ondansetron ]    Azithromycin Itching   Ciprofloxacin  Nausea Only   Pt states she has never had a rash   Clopidogrel  Nausea And Vomiting   Colchicine Nausea Only, Other (See Comments)   Dizzness   Glucosamine Nausea And Vomiting, Other (See Comments)   Leflunomide Diarrhea, Itching   Meloxicam Itching   Methotrexate  Nausea Only   Zoloft [sertraline] Nausea And Vomiting        Medication List        Accurate as of March 24, 2024  8:47 AM. If you have any questions, ask your nurse or doctor.          Accu-Chek Guide test strip Generic drug: glucose blood   acetaminophen  325 MG tablet Commonly known as: TYLENOL  Take 2 tablets (650 mg total) by mouth every 6 (six) hours as needed for mild pain (or Fever >/= 101). What changed: how much to take   Align 4 MG Caps Take 8 mg by mouth daily.   amLODipine  10 MG tablet Commonly known as: NORVASC  Take 10 mg by mouth daily.   aspirin  EC 81 MG tablet Take 1 tablet (81 mg total) by mouth daily. Swallow whole.   B-D SINGLE USE SWABS REGULAR Pads   Biotin 5000 MCG Tabs Take 10,000 mcg by mouth daily.   clopidogrel  75 MG tablet Commonly known as: PLAVIX  Take 1 tablet (75 mg total) by mouth daily with breakfast.    clotrimazole 10 MG troche Commonly known as: MYCELEX Take 10 mg by mouth 3 (three) times daily.   D 1000 25 MCG (1000 UT) capsule Generic drug: Cholecalciferol Take 1,000 Units by mouth daily.   ferrous sulfate  325 (65 FE) MG tablet Take 1 tablet (325 mg total) by mouth daily with breakfast.   glimepiride  4 MG tablet Commonly known as: AMARYL  Take 4 mg by mouth daily with breakfast.   guaiFENesin  600 MG 12 hr tablet Commonly known as: MUCINEX  Take 1 tablet (600 mg total) by mouth 2 (two) times daily.   Lantus  SoloStar 100 UNIT/ML Solostar Pen Generic drug: insulin  glargine 15 Units at bedtime.   metoprolol  succinate 50 MG 24 hr tablet Commonly known as: TOPROL -XL Take 50 mg by mouth daily.   pantoprazole  20 MG tablet Commonly known as: PROTONIX  Take 1 tablet (20 mg total) by mouth 2 (two) times daily before meals Take 30 minutes before meals and other medications.   Restasis  0.05 % ophthalmic emulsion Generic drug: cycloSPORINE  Place 1 drop into both eyes daily as needed (Dry eyes).   rosuvastatin  10 MG tablet Commonly known as: CRESTOR  Take 10 mg by mouth daily.   True Metrix Level 1 Low Soln        Allergies:  Allergies  Allergen Reactions   Doxazosin Other (See Comments)    headache   Fish Allergy Anaphylaxis, Itching, Nausea Only and Swelling    She states her tongue turns black.    Dicloxacillin Nausea And Vomiting    Has patient had a PCN reaction causing immediate rash, facial/tongue/throat swelling, SOB or lightheadedness with hypotension: No Has patient had a PCN reaction causing severe rash involving mucus membranes or skin necrosis: No Has patient had a PCN reaction that required hospitalization: Unknown Has patient had a PCN reaction occurring within the last 10 years: Unknown If all of the above answers are NO, then may proceed with Cephalosporin use.   Iodine Hives    Per patient, she had a previous reaction/hives from an iodine  injection   Sulfa Antibiotics Swelling    Loss of appetite   Constipation bloating Other reaction(s): Headache    Cardura [Doxazosin Mesylate]     Headache    Etodolac Itching and Swelling   Sulfamethoxazole-Trimethoprim Nausea Only   Zofran  [Ondansetron ]    Azithromycin Itching   Ciprofloxacin  Nausea Only    Pt states she has  never had a rash    Clopidogrel  Nausea And Vomiting   Colchicine Nausea Only and Other (See Comments)    Dizzness   Glucosamine Nausea And Vomiting and Other (See Comments)   Leflunomide Diarrhea and Itching   Meloxicam Itching   Methotrexate Nausea Only   Zoloft [Sertraline] Nausea And Vomiting    Family History: Family History  Problem Relation Age of Onset   Diabetes type II Mother    Hypertension Mother    Colon polyps Mother    Breast cancer Maternal Aunt     Social History:  reports that she has never smoked. She has never used smokeless tobacco. She reports that she does not drink alcohol  and does not use drugs.   Physical Exam: BP (!) 145/79   Pulse 84   Ht 5' 2 (1.575 m)   Wt 173 lb (78.5 kg)   LMP  (LMP Unknown)   SpO2 95%   BMI 31.64 kg/m   Constitutional:  Well nourished. Alert and oriented, No acute distress. HEENT: [place] AT, moist mucus membranes.  Trachea midline Cardiovascular: No clubbing, cyanosis, or edema. Respiratory: Normal respiratory effort, no increased work of breathing. Neurologic: Grossly intact, no focal deficits, moving all 4 extremities. Psychiatric: Normal mood and affect.    Laboratory Data: See Epic and HPI   I have reviewed the labs.  See HPI.      Pertinent Imaging   03/24/24 08:36  Scan Result 0mL    Assessment & Plan:    Right renal stones -  asymptomatic - continue good hydration - RTC triggers discussed   2. rUTI's - continue OTC preventative strategies    3. High risk hematuria - work up in May 2021 - bilateral nephrolithiasis - work up in Oct 2024 - tiny right renal stones - no  reports of gross heme - UA w/o micro heme   4. Urge incontinence -Mild and not bothersome  Return in about 1 year (around 03/24/2025) for UA, PVR and OAB questionnaire.  [facility] [address] [place], [place] [address] [phone]

## 2024-03-23 ENCOUNTER — Other Ambulatory Visit: Payer: Self-pay | Admitting: Family Medicine

## 2024-03-23 ENCOUNTER — Ambulatory Visit: Admitting: Urology

## 2024-03-23 DIAGNOSIS — R1310 Dysphagia, unspecified: Secondary | ICD-10-CM

## 2024-03-24 ENCOUNTER — Encounter: Payer: Self-pay | Admitting: Urology

## 2024-03-24 ENCOUNTER — Ambulatory Visit: Admitting: Urology

## 2024-03-24 VITALS — BP 145/79 | HR 84 | Ht 62.0 in | Wt 173.0 lb

## 2024-03-24 DIAGNOSIS — N3941 Urge incontinence: Secondary | ICD-10-CM | POA: Diagnosis not present

## 2024-03-24 DIAGNOSIS — R319 Hematuria, unspecified: Secondary | ICD-10-CM | POA: Diagnosis not present

## 2024-03-24 DIAGNOSIS — N390 Urinary tract infection, site not specified: Secondary | ICD-10-CM | POA: Diagnosis not present

## 2024-03-24 DIAGNOSIS — N200 Calculus of kidney: Secondary | ICD-10-CM

## 2024-03-24 LAB — URINALYSIS, COMPLETE
Bilirubin, UA: NEGATIVE
Glucose, UA: NEGATIVE
Ketones, UA: NEGATIVE
Nitrite, UA: NEGATIVE
Protein,UA: NEGATIVE
RBC, UA: NEGATIVE
Specific Gravity, UA: 1.01 (ref 1.005–1.030)
Urobilinogen, Ur: 0.2 mg/dL (ref 0.2–1.0)
pH, UA: 6 (ref 5.0–7.5)

## 2024-03-24 LAB — MICROSCOPIC EXAMINATION: Epithelial Cells (non renal): >10 /HPF — AB (ref 0–10)

## 2024-03-24 LAB — BLADDER SCAN AMB NON-IMAGING

## 2024-03-28 ENCOUNTER — Ambulatory Visit
Admission: RE | Admit: 2024-03-28 | Discharge: 2024-03-28 | Disposition: A | Discharge status: Home / Self Care | Source: Ambulatory Visit | Attending: Family Medicine | Admitting: Family Medicine

## 2024-03-28 DIAGNOSIS — R1310 Dysphagia, unspecified: Secondary | ICD-10-CM | POA: Insufficient documentation

## 2024-04-04 ENCOUNTER — Emergency Department

## 2024-04-04 ENCOUNTER — Other Ambulatory Visit: Payer: Self-pay

## 2024-04-04 ENCOUNTER — Observation Stay: Admission: EM | Admit: 2024-04-04 | Discharge: 2024-04-06 | Disposition: A | Discharge status: Home / Self Care

## 2024-04-04 DIAGNOSIS — E119 Type 2 diabetes mellitus without complications: Secondary | ICD-10-CM | POA: Insufficient documentation

## 2024-04-04 DIAGNOSIS — M069 Rheumatoid arthritis, unspecified: Secondary | ICD-10-CM | POA: Insufficient documentation

## 2024-04-04 DIAGNOSIS — K222 Esophageal obstruction: Secondary | ICD-10-CM | POA: Diagnosis not present

## 2024-04-04 DIAGNOSIS — I10 Essential (primary) hypertension: Secondary | ICD-10-CM | POA: Diagnosis not present

## 2024-04-04 DIAGNOSIS — E876 Hypokalemia: Secondary | ICD-10-CM | POA: Insufficient documentation

## 2024-04-04 DIAGNOSIS — I2510 Atherosclerotic heart disease of native coronary artery without angina pectoris: Secondary | ICD-10-CM | POA: Insufficient documentation

## 2024-04-04 DIAGNOSIS — F419 Anxiety disorder, unspecified: Secondary | ICD-10-CM | POA: Diagnosis not present

## 2024-04-04 DIAGNOSIS — Z79899 Other long term (current) drug therapy: Secondary | ICD-10-CM | POA: Insufficient documentation

## 2024-04-04 DIAGNOSIS — Z853 Personal history of malignant neoplasm of breast: Secondary | ICD-10-CM | POA: Diagnosis not present

## 2024-04-04 DIAGNOSIS — R112 Nausea with vomiting, unspecified: Secondary | ICD-10-CM | POA: Diagnosis not present

## 2024-04-04 DIAGNOSIS — Z955 Presence of coronary angioplasty implant and graft: Secondary | ICD-10-CM | POA: Diagnosis not present

## 2024-04-04 DIAGNOSIS — Z794 Long term (current) use of insulin: Secondary | ICD-10-CM | POA: Insufficient documentation

## 2024-04-04 DIAGNOSIS — K219 Gastro-esophageal reflux disease without esophagitis: Secondary | ICD-10-CM | POA: Diagnosis not present

## 2024-04-04 DIAGNOSIS — Z8673 Personal history of transient ischemic attack (TIA), and cerebral infarction without residual deficits: Secondary | ICD-10-CM | POA: Diagnosis not present

## 2024-04-04 DIAGNOSIS — R1310 Dysphagia, unspecified: Secondary | ICD-10-CM | POA: Diagnosis present

## 2024-04-04 DIAGNOSIS — E785 Hyperlipidemia, unspecified: Secondary | ICD-10-CM | POA: Insufficient documentation

## 2024-04-04 LAB — COMPREHENSIVE METABOLIC PANEL WITH GFR
ALT: 9 U/L (ref 0–44)
AST: 22 U/L (ref 15–41)
Albumin: 3.6 g/dL (ref 3.5–5.0)
Alkaline Phosphatase: 51 U/L (ref 38–126)
Anion gap: 12 (ref 5–15)
BUN: 5 mg/dL — ABNORMAL LOW (ref 8–23)
CO2: 27 mmol/L (ref 22–32)
Calcium: 9.1 mg/dL (ref 8.9–10.3)
Chloride: 101 mmol/L (ref 98–111)
Creatinine, Ser: 0.74 mg/dL (ref 0.44–1.00)
GFR, Estimated: >60 mL/min
Glucose, Bld: 101 mg/dL — ABNORMAL HIGH (ref 70–99)
Potassium: 3.2 mmol/L — ABNORMAL LOW (ref 3.5–5.1)
Sodium: 139 mmol/L (ref 135–145)
Total Bilirubin: 0.5 mg/dL (ref 0.0–1.2)
Total Protein: 6.9 g/dL (ref 6.5–8.1)

## 2024-04-04 LAB — CBC
HCT: 41.1 % (ref 36.0–46.0)
Hemoglobin: 14.8 g/dL (ref 12.0–15.0)
MCH: 30.1 pg (ref 26.0–34.0)
MCHC: 36 g/dL (ref 30.0–36.0)
MCV: 83.7 fL (ref 80.0–100.0)
Platelets: 258 K/uL (ref 150–400)
RBC: 4.91 MIL/uL (ref 3.87–5.11)
RDW: 14 % (ref 11.5–15.5)
WBC: 7.1 K/uL (ref 4.0–10.5)
nRBC: 0 % (ref 0.0–0.2)

## 2024-04-04 LAB — LIPASE, BLOOD: Lipase: 20 U/L (ref 11–51)

## 2024-04-04 LAB — GLUCOSE, CAPILLARY: Glucose-Capillary: 102 mg/dL — ABNORMAL HIGH (ref 70–99)

## 2024-04-04 LAB — TROPONIN T, HIGH SENSITIVITY
Troponin T High Sensitivity: <15 ng/L (ref 0–19)
Troponin T High Sensitivity: <15 ng/L (ref 0–19)

## 2024-04-04 MED ORDER — ACETAMINOPHEN 325 MG PO TABS
650.0000 mg | ORAL_TABLET | Freq: Four times a day (QID) | ORAL | Status: DC | PRN
  Administered 2024-04-05 – 2024-04-06 (×2): 650 mg via ORAL
  Filled 2024-04-04 (×3): qty 2

## 2024-04-04 MED ORDER — INSULIN ASPART 100 UNIT/ML IJ SOLN
0.0000 [IU] | Freq: Every day | INTRAMUSCULAR | Status: DC
  Administered 2024-04-05: 2 [IU] via SUBCUTANEOUS
  Filled 2024-04-04: qty 2

## 2024-04-04 MED ORDER — SODIUM CHLORIDE 0.9% FLUSH
3.0000 mL | Freq: Two times a day (BID) | INTRAVENOUS | Status: DC
  Administered 2024-04-04 – 2024-04-06 (×3): 3 mL via INTRAVENOUS

## 2024-04-04 MED ORDER — HYDRALAZINE HCL 20 MG/ML IJ SOLN: 5.0000 mg | Freq: Four times a day (QID) | INTRAMUSCULAR | Status: DC | PRN

## 2024-04-04 MED ORDER — ALPRAZOLAM 0.25 MG PO TABS
0.2500 mg | ORAL_TABLET | Freq: Every evening | ORAL | Status: DC
  Administered 2024-04-04 – 2024-04-05 (×2): 0.25 mg via ORAL
  Filled 2024-04-04 (×2): qty 1

## 2024-04-04 MED ORDER — POTASSIUM CHLORIDE 10 MEQ/100ML IV SOLN
10.0000 meq | INTRAVENOUS | Status: AC
  Administered 2024-04-04 (×3): 10 meq via INTRAVENOUS
  Filled 2024-04-04: qty 100

## 2024-04-04 MED ORDER — SODIUM CHLORIDE 0.9 % IV BOLUS
500.0000 mL | Freq: Once | INTRAVENOUS | Status: AC
  Administered 2024-04-04: 500 mL via INTRAVENOUS

## 2024-04-04 MED ORDER — INSULIN ASPART 100 UNIT/ML IJ SOLN
0.0000 [IU] | Freq: Three times a day (TID) | INTRAMUSCULAR | Status: DC
  Administered 2024-04-06: 2 [IU] via SUBCUTANEOUS
  Filled 2024-04-04: qty 2

## 2024-04-04 MED ORDER — POTASSIUM CHLORIDE IN NACL 20-0.9 MEQ/L-% IV SOLN
INTRAVENOUS | Status: DC
  Filled 2024-04-04: qty 1000

## 2024-04-04 MED ORDER — FAMOTIDINE IN NACL 20-0.9 MG/50ML-% IV SOLN
20.0000 mg | Freq: Once | INTRAVENOUS | Status: AC
  Administered 2024-04-04: 20 mg via INTRAVENOUS
  Filled 2024-04-04: qty 50

## 2024-04-04 MED ORDER — PANTOPRAZOLE SODIUM 40 MG IV SOLR
40.0000 mg | Freq: Two times a day (BID) | INTRAVENOUS | Status: DC
  Administered 2024-04-04 – 2024-04-05 (×2): 40 mg via INTRAVENOUS
  Filled 2024-04-04 (×3): qty 10

## 2024-04-04 MED ORDER — ALPRAZOLAM 0.25 MG PO TABS: 0.2500 mg | ORAL_TABLET | Freq: Every day | ORAL | Status: DC

## 2024-04-04 MED ORDER — METOPROLOL TARTRATE 5 MG/5ML IV SOLN
2.5000 mg | Freq: Three times a day (TID) | INTRAVENOUS | Status: DC
  Administered 2024-04-04 – 2024-04-05 (×3): 2.5 mg via INTRAVENOUS
  Filled 2024-04-04 (×4): qty 5

## 2024-04-04 MED ORDER — SODIUM CHLORIDE 0.9 % IV SOLN: INTRAVENOUS | Status: DC

## 2024-04-04 MED ORDER — ACETAMINOPHEN 650 MG RE SUPP: 650.0000 mg | Freq: Four times a day (QID) | RECTAL | Status: DC | PRN

## 2024-04-04 NOTE — Plan of Care (Signed)

## 2024-04-04 NOTE — ED Triage Notes (Signed)
 Pt presents to the ED via POV from home with acid reflux x6 weeks. Pt states that she recently had a barium swallow study. Pt states that she keeps feeling like she is choking on the acid. Pt reports sore throat from vomiting. Pt also reports cough and generalized chest soreness. Pt reports waking up at 4am with a burning in her chest. A&Ox4. Reports daughter dropping her off to be seen.

## 2024-04-04 NOTE — H&P (Signed)
 " History and Physical    Patient: Elaine Cox FMW:[id number] DOB: 07-05-1945 DOA: 04/04/2024 DOS: the patient was seen and examined on 04/04/2024 PCP: Sadie Manna, MD  Patient coming from: Home-lives with family  Chief Complaint:  Chief Complaint  Patient presents with   Gastroesophageal Reflux   HPI: Elaine Cox is a 78 y.o. female with medical history significant of history of CAD and prior non-STEMI, diabetes mellitus on insulin , hypertension, severe GERD, dyslipidemia, rheumatoid arthritis.  Patient reports years of GERD but over the past 2 months symptoms have worsened.  About 1 month ago she had her most severe episode.  She underwent an outpatient barium study that did reveal a distal esophageal stricture.  She has been unable to eat or drink for at least 1 week and at times eating has precipitated emesis type situation.  She states it feels like anything she drinks is bubbling in her throat and comes right back up.  She has only been able to eat a few tablespoons of applesauce per attempt.  Over the past several days she has been coughing.  It is unclear if this is related to reflux related irritation or an infectious process.  She has been in contact with sick individuals in her family are experiencing URI symptoms.  Chest x-ray unremarkable.  GI/Dr. Onita has been consulted by the EDP and plans are to proceed with EGD tomorrow.  Hospitalist service has been asked to evaluate the patient for admission   Review of Systems: As mentioned in the history of present illness. All other systems reviewed and are negative.  Patient reports hives secondary to prolonged usage of prednisone  and states she is currently on a prednisone  taper to off.  She is on chronic prednisone  secondary to her rheumatoid arthritis.  She has had unsuccessful attempts at prior utilization of DMARDs.  She also reports her CBG was low for her at 76 this morning.   Past Medical History:  Diagnosis Date    Anginal pain 06/2016   being followed by dr. bosie   Anxiety    Arthritis    Osteoarthritis   Arthritis    Rheumatoid   Breast cancer (HCC) 2000   Right Breast - Chemotherapy   Collagen vascular disease    Rheumatoid Arthritis.   Coronary artery disease    Diabetes mellitus without complication [facility])    Patient takes Metformin    Dyspnea 03/2017   GERD (gastroesophageal reflux disease)    History of abdominal hysterectomy    History of eyelid surgery    History of kidney stones    Hyperlipidemia    Hypertension    Lumbago    Murmur    Myocardial infarction (HCC) 07/2010   Orthopnea 03/2017   Personal history of chemotherapy 2000   right breast ca   Sinus disorder    Stroke [facility]) 2006, 2012   Wears dentures    full upper and lower   Past Surgical History:  Procedure Laterality Date   ABDOMINAL HYSTERECTOMY     BREAST SURGERY Right    mastectomy    CARDIAC CATHETERIZATION  2012   stent placed   CATARACT EXTRACTION W/PHACO Right 06/24/2017   Procedure: CATARACT EXTRACTION PHACO AND INTRAOCULAR LENS PLACEMENT (IOC) COMPLICATED RIGHT DIABETIC;  Surgeon: Mittie Gaskin, MD;  Location: [facility];  Service: Ophthalmology;  Laterality: Right;  Diabetes - oral med   COLONOSCOPY     COLONOSCOPY WITH PROPOFOL  N/A 12/08/2017   Procedure: COLONOSCOPY WITH PROPOFOL ;  Surgeon:  Toledo, Ladell POUR, MD;  Location: [facility];  Service: Gastroenterology;  Laterality: N/A;   CORONARY ANGIOPLASTY     CORONARY STENT INTERVENTION N/A 02/24/2018   Procedure: CORONARY STENT INTERVENTION;  Surgeon: Ammon Blunt, MD;  Location: [facility];  Service: Cardiovascular;  Laterality: N/A;   CORONARY STENT INTERVENTION N/A 03/25/2023   Procedure: CORONARY STENT INTERVENTION;  Surgeon: Ammon Blunt, MD;  Location: [facility];  Service: Cardiovascular;  Laterality: N/A;   CYSTOSCOPY N/A 09/08/2016   Procedure: CYSTOSCOPY;  Surgeon: Ward, Mitzie BROCKS, MD;   Location: [facility];  Service: Gynecology;  Laterality: N/A;   EYE SURGERY Left    Cataract Extraction with IOL   KNEE ARTHROPLASTY Right 03/14/2020   Procedure: COMPUTER ASSISTED TOTAL KNEE ARTHROPLASTY;  Surgeon: Mardee Lynwood SQUIBB, MD;  Location: [facility];  Service: Orthopedics;  Laterality: Right;  FOLLOWING 1ST CASE   KNEE ARTHROSCOPY W/ MENISCAL REPAIR Right    LAPAROSCOPIC BILATERAL SALPINGO OOPHERECTOMY Bilateral 09/08/2016   Procedure: LAPAROSCOPIC BILATERAL SALPINGO OOPHORECTOMY;  Surgeon: Ward, Mitzie BROCKS, MD;  Location: [facility];  Service: Gynecology;  Laterality: Bilateral;   LEFT HEART CATH AND CORONARY ANGIOGRAPHY Left 02/24/2018   Procedure: LEFT HEART CATH AND CORONARY ANGIOGRAPHY;  Surgeon: Ammon Blunt, MD;  Location: [facility];  Service: Cardiovascular;  Laterality: Left;   LEFT HEART CATH AND CORONARY ANGIOGRAPHY Left 03/25/2023   Procedure: LEFT HEART CATH AND CORONARY ANGIOGRAPHY;  Surgeon: Ammon Blunt, MD;  Location: [facility];  Service: Cardiovascular;  Laterality: Left;   MASTECTOMY Right 2000   BREAST CA   Social History:  reports that she has never smoked. She has never used smokeless tobacco. She reports that she does not drink alcohol  and does not use drugs.  Allergies[1]  Family History  Problem Relation Age of Onset   Diabetes type II Mother    Hypertension Mother    Colon polyps Mother    Breast cancer Maternal Aunt     Prior to Admission medications  Medication Sig Start Date End Date Taking? Authorizing Provider  acetaminophen  (TYLENOL ) 325 MG tablet Take 2 tablets (650 mg total) by mouth every 6 (six) hours as needed for mild pain (or Fever >/= 101). Patient taking differently: Take 1,000 mg by mouth every 6 (six) hours as needed for mild pain (pain score 1-3) (or Fever >/= 101). 03/09/18  Yes Gouru, Aruna, MD  ALPRAZolam  (XANAX ) 0.25 MG tablet Take 0.25 mg by mouth daily. 04/04/24  Yes [provider]   amLODipine  (NORVASC ) 10 MG tablet Take 10 mg by mouth daily.   Yes [provider]  aspirin  EC 81 MG EC tablet Take 1 tablet (81 mg total) by mouth daily. Swallow whole. 08/17/20  Yes Jens Durand, MD  Biotin 5000 MCG TABS Take 10,000 mcg by mouth daily.   Yes [provider]  Cholecalciferol (D 1000) 25 MCG (1000 UT) capsule Take 1,000 Units by mouth daily.   Yes [provider]  ferrous sulfate  325 (65 FE) MG tablet Take 1 tablet (325 mg total) by mouth daily with breakfast. 11/27/23  Yes Dezii, Alexandra, DO  glimepiride  (AMARYL ) 4 MG tablet Take 4 mg by mouth daily with breakfast. 10/14/22  Yes [provider]  LANTUS  SOLOSTAR 100 UNIT/ML Solostar Pen 15 Units daily. 11/05/23  Yes [provider]  metoprolol  succinate (TOPROL -XL) 50 MG 24 hr tablet Take 50 mg by mouth daily. 06/15/19  Yes [provider]  ondansetron  (ZOFRAN ) 4 MG tablet  Take 4 mg by mouth every 8 (eight) hours as needed. 03/29/24 04/05/24 Yes [provider]  pantoprazole  (PROTONIX ) 20 MG tablet Take 1 tablet (20 mg total) by mouth 2 (two) times daily before meals Take 30 minutes before meals and other medications. 05/14/23 05/13/24 Yes [provider]  predniSONE  (DELTASONE ) 5 MG tablet Take 7.25 mg by mouth daily with breakfast.   Yes [provider]  Probiotic Product (ALIGN) 4 MG CAPS Take 8 mg by mouth daily.   Yes [provider]  rosuvastatin  (CRESTOR ) 5 MG tablet Take 5 mg by mouth daily. 04/02/24  Yes [provider]  ACCU-CHEK GUIDE test strip  12/08/19   [provider]  Alcohol  Swabs (B-D SINGLE USE SWABS REGULAR) PADS  12/20/19   [provider]  Blood Glucose Calibration (TRUE METRIX LEVEL 1) Low SOLN  10/24/19   [provider]  clopidogrel  (PLAVIX ) 75 MG tablet Take 1 tablet (75 mg total) by mouth daily with breakfast. Patient not taking: Reported on 04/04/2024 03/26/23   Hudson, Caralyn, PA-C   clotrimazole (MYCELEX) 10 MG troche Take 10 mg by mouth 3 (three) times daily. Patient not taking: Reported on 04/04/2024 11/04/23   [provider]  guaiFENesin  (MUCINEX ) 600 MG 12 hr tablet Take 1 tablet (600 mg total) by mouth 2 (two) times daily. Patient not taking: Reported on 03/24/2024 11/26/23   Dezii, Alexandra, DO  RESTASIS  0.05 % ophthalmic emulsion Place 1 drop into both eyes daily as needed (Dry eyes). Patient not taking: Reported on 04/04/2024    [provider]  rosuvastatin  (CRESTOR ) 10 MG tablet Take 10 mg by mouth daily. Patient not taking: Reported on 11/23/2023    [provider]    Physical Exam: Vitals:   04/04/24 1139 04/04/24 1141  BP: 115/87   Pulse: 90   Resp: 18   Temp: 98.4 F (36.9 C)   TempSrc: Oral   SpO2: 97%   Weight:  78 kg  Height:  5' 2 (1.575 m)   Constitutional: NAD, calm, comfortable at rest. Respiratory: clear to auscultation bilaterally, no wheezing, no crackles. Normal respiratory effort. No accessory muscle use.  Room air.  Does have upper airway congested sounds and wet sounding cough. Cardiovascular: Regular rate and rhythm, no murmurs / rubs / gallops. No extremity edema. 2+ pedal pulses.  Abdomen: no tenderness, no masses palpated. No hepatosplenomegaly. Bowel sounds positive.  Musculoskeletal: no clubbing / cyanosis. No joint deformity upper and lower extremities. Good ROM, no contractures. Normal muscle tone.  Skin: no rashes, lesions, ulcers. No induration Neurologic: CN 2-12 grossly intact. Sensation intact, Strength 5/5 x all 4 extremities.  Psychiatric: Normal judgment and insight. Alert and oriented x 3. Normal mood.     Data Reviewed:  Sodium 139, potassium 3.2, glucose 101, BUN 5, creatinine 0.74, LFTs are normal  Troponin T less than 15  WBC 7100 differential not obtained, hemoglobin 14.8, platelets 258,000  Assessment and Plan: Intractable nausea and vomiting Known distal esophageal  stricture Longstanding GERD Presented with inability to tolerate liquids or applesauce Evaluated by GI and plans are to proceed with EGD tomorrow on 12/30.  Anticipate will need esophageal dilatation Patient has been on Protonix  at home.  Will add IV dose Allow sips of clears.  Patient has been instructed to sip very small amounts and she must remain upright to allow for gravity to assist with esophageal motility Suspect associated mild dehydration therefore will initiate maintenance IV fluids In June patient's A1c was  quite elevated at 9.0-unclear if diabetic gastroparesis is influencing GERD  Acute hypokalemia Potassium 3.2 Unable to tolerate oral replacement Normal saline with 20meq of KCL at 75 cc/h 10 mEq IV KCl boluses x 3 Repeat lab in a.m.  Wet sounding cough Chest x-ray unremarkable There are sick contacts in the family so unclear if coughing secondary to aspiration in context of severe reflux/esophageal stricture or if viral illness Dietary precautions as above Check a 20 pathogen viral respiratory panel  Diabetes mellitus Poor oral intake and had symptomatic relative hypoglycemia with a CBG of 75 at home Hold insulin  glargine and Amaryl  Follow CBGs and provide SSI if indicated Hemoglobin A1c was 9.0 in June Repeat hemoglobin A1c here  Rheumatoid arthritis Currently on prednisone  taper in an effort to completely discontinue prednisone -will hold prednisone  for now given inability to tolerate orals and upcoming GI procedure Has failed other DMARD therapies.  Please refer to outpatient rheumatology note  Hypertension CAD Dyslipidemia Reports difficulty swallowing oral medications at home For now we will provide as needed IV Apresoline  to manage any episodes of hypertension Hold statin and baby aspirin  Can convert beta-blocker to IV  Anxiety disorder Allow daily Xanax -can give sublingual if necessary    Advance Care Planning:   Code Status: Full Code   VTE  prophylaxis: SCDs noting upcoming EGD on 12/30 that may require esophageal dilatation  Consults: Gastroenterology  Family Communication: Family at bedside  Severity of Illness: The appropriate patient status for this patient is OBSERVATION. Observation status is judged to be reasonable and necessary in order to provide the required intensity of service to ensure the patient's safety. The patient's presenting symptoms, physical exam findings, and initial radiographic and laboratory data in the context of their medical condition is felt to place them at decreased risk for further clinical deterioration. Furthermore, it is anticipated that the patient will be medically stable for discharge from the hospital within 2 midnights of admission.   Author: Isaiah Lever, NP 04/04/2024 3:02 PM  For on call review [email or web address].     [1]  Allergies Allergen Reactions   Doxazosin Other (See Comments)    headache   Fish Allergy Anaphylaxis, Itching, Nausea Only and Swelling    She states her tongue turns black.    Dicloxacillin Nausea And Vomiting    Has patient had a PCN reaction causing immediate rash, facial/tongue/throat swelling, SOB or lightheadedness with hypotension: No Has patient had a PCN reaction causing severe rash involving mucus membranes or skin necrosis: No Has patient had a PCN reaction that required hospitalization: Unknown Has patient had a PCN reaction occurring within the last 10 years: Unknown If all of the above answers are NO, then may proceed with Cephalosporin use.   Iodine Hives    Per patient, she had a previous reaction/hives from an iodine injection   Sulfa Antibiotics Swelling    Loss of appetite   Constipation bloating Other reaction(s): Headache    Cardura [Doxazosin Mesylate]     Headache    Etodolac Itching and Swelling   Sulfamethoxazole-Trimethoprim Nausea Only   Zofran  [Ondansetron ]    Azithromycin Itching   Ciprofloxacin  Nausea Only    Pt states  she has never had a rash    Clopidogrel  Nausea And Vomiting   Colchicine Nausea Only and Other (See Comments)    Dizzness   Glucosamine Nausea And Vomiting and Other (See Comments)   Leflunomide Diarrhea and Itching   Meloxicam Itching   Methotrexate Nausea Only   Zoloft [Sertraline]  Nausea And Vomiting   "

## 2024-04-04 NOTE — H&P (View-Only) (Signed)
 "   Inpatient Consultation   Patient ID: Elaine Cox is a 78 y.o. female.  Requesting Provider: Rolland Moats, MD  Date of Admission: 04/04/2024  Date of Consult: 04/04/2024   Reason for Consultation: dysphagia   Patient's Chief Complaint:   Chief Complaint  Patient presents with   Gastroesophageal Reflux    78 year old AAF with RA, history of breast cancer status postchemotherapy, CAD, GERD, DM2-ID, anxiety who presents to the hospital with worsening dysphagia.  GI is consulted for this.  Patient has had longstanding GERD for multiple years and has been on multiple agents for this.  Over the last 6 to 8 weeks she has noted progressive dysphagia going from solids to that include liquids and pills.  No outright odynophagia.  She underwent an outpatient esophagram revealing esophageal narrowing.  She is try to consume things like applesauce, but these were regurgitated back up.  No odynophagia.  Currently on Protonix  20 mg twice a day at home  Was recently hospitalized after starting Enbrel and being intolerant of this.  At that point in time she had lost approximately 10 pounds.  Has been on longstanding prednisone  as well as alendronate.  Denies any doxycycline or NSAID use.  Bowels otherwise regular without melena or hematochezia.  She does note that she has been coughing since this weekend. Some family members at home with URI sx. CXR nml here  Denies NSAIDs, Anti-plt agents, and anticoagulants Denies family history of gastrointestinal disease and malignancy Previous Endoscopies: 2019 [facility]     Past Medical History:  Diagnosis Date   Anginal pain 06/2016   being followed by dr. bosie   Anxiety    Arthritis    Osteoarthritis   Arthritis    Rheumatoid   Breast cancer (HCC) 2000   Right Breast - Chemotherapy   Collagen vascular disease    Rheumatoid Arthritis.   Coronary artery disease    Diabetes mellitus without complication [facility])    Patient takes Metformin     Dyspnea 03/2017   GERD (gastroesophageal reflux disease)    History of abdominal hysterectomy    History of eyelid surgery    History of kidney stones    Hyperlipidemia    Hypertension    Lumbago    Murmur    Myocardial infarction (HCC) 07/2010   Orthopnea 03/2017   Personal history of chemotherapy 2000   right breast ca   Sinus disorder    Stroke [facility]) 2006, 2012   Wears dentures    full upper and lower    Past Surgical History:  Procedure Laterality Date   ABDOMINAL HYSTERECTOMY     BREAST SURGERY Right    mastectomy    CARDIAC CATHETERIZATION  2012   stent placed   CATARACT EXTRACTION W/PHACO Right 06/24/2017   Procedure: CATARACT EXTRACTION PHACO AND INTRAOCULAR LENS PLACEMENT (IOC) COMPLICATED RIGHT DIABETIC;  Surgeon: Mittie Gaskin, MD;  Location: [facility];  Service: Ophthalmology;  Laterality: Right;  Diabetes - oral med   COLONOSCOPY     COLONOSCOPY WITH PROPOFOL  N/A 12/08/2017   Procedure: COLONOSCOPY WITH PROPOFOL ;  Surgeon: Toledo, Ladell POUR, MD;  Location: [facility] ENDOSCOPY;  Service: Gastroenterology;  Laterality: N/A;   CORONARY ANGIOPLASTY     CORONARY STENT INTERVENTION N/A 02/24/2018   Procedure: CORONARY STENT INTERVENTION;  Surgeon: Ammon Blunt, MD;  Location: [facility] INVASIVE CV LAB;  Service: Cardiovascular;  Laterality: N/A;   CORONARY STENT INTERVENTION N/A 03/25/2023   Procedure: CORONARY STENT INTERVENTION;  Surgeon: Ammon Blunt, MD;  Location: [facility] INVASIVE CV LAB;  Service: Cardiovascular;  Laterality: N/A;   CYSTOSCOPY N/A 09/08/2016   Procedure: CYSTOSCOPY;  Surgeon: Ward, Mitzie BROCKS, MD;  Location: [facility] ORS;  Service: Gynecology;  Laterality: N/A;   EYE SURGERY Left    Cataract Extraction with IOL   KNEE ARTHROPLASTY Right 03/14/2020   Procedure: COMPUTER ASSISTED TOTAL KNEE ARTHROPLASTY;  Surgeon: Mardee Lynwood SQUIBB, MD;  Location: [facility] ORS;  Service: Orthopedics;  Laterality: Right;  FOLLOWING 1ST CASE   KNEE ARTHROSCOPY W/  MENISCAL REPAIR Right    LAPAROSCOPIC BILATERAL SALPINGO OOPHERECTOMY Bilateral 09/08/2016   Procedure: LAPAROSCOPIC BILATERAL SALPINGO OOPHORECTOMY;  Surgeon: Ward, Mitzie BROCKS, MD;  Location: [facility] ORS;  Service: Gynecology;  Laterality: Bilateral;   LEFT HEART CATH AND CORONARY ANGIOGRAPHY Left 02/24/2018   Procedure: LEFT HEART CATH AND CORONARY ANGIOGRAPHY;  Surgeon: Ammon Blunt, MD;  Location: [facility] INVASIVE CV LAB;  Service: Cardiovascular;  Laterality: Left;   LEFT HEART CATH AND CORONARY ANGIOGRAPHY Left 03/25/2023   Procedure: LEFT HEART CATH AND CORONARY ANGIOGRAPHY;  Surgeon: Ammon Blunt, MD;  Location: [facility] INVASIVE CV LAB;  Service: Cardiovascular;  Laterality: Left;   MASTECTOMY Right 2000   BREAST CA    Allergies[1]  Family History  Problem Relation Age of Onset   Diabetes type II Mother    Hypertension Mother    Colon polyps Mother    Breast cancer Maternal Aunt     Social History[2]   Pertinent GI related history and allergies were reviewed with the patient  Review of Systems  Constitutional:  Positive for appetite change. Negative for activity change, chills, diaphoresis, fatigue, fever and unexpected weight change.  HENT:  Positive for trouble swallowing. Negative for voice change.   Respiratory:  Negative for shortness of breath and wheezing.   Cardiovascular:  Negative for chest pain, palpitations and leg swelling.  Gastrointestinal:  Positive for nausea and vomiting. Negative for abdominal distention, abdominal pain, anal bleeding, blood in stool, constipation, diarrhea and rectal pain.  Skin:  Negative for color change and pallor.  Neurological:  Negative for dizziness, syncope and weakness.  Psychiatric/Behavioral:  Negative for confusion.   All other systems reviewed and are negative.    Medications Home Medications Medications Ordered Prior to Encounter[3] Pertinent GI related medications were reviewed with the patient  Inpatient  Medications Current Medications[4]  0.9 % NaCl with KCl 20 mEq / L     potassium chloride       acetaminophen  **OR** acetaminophen , hydrALAZINE    Objective   Vitals:   04/04/24 1139 04/04/24 1141  BP: 115/87   Pulse: 90   Resp: 18   Temp: 98.4 F (36.9 C)   TempSrc: Oral   SpO2: 97%   Weight:  78 kg  Height:  5' 2 (1.575 m)     Physical Exam Vitals and nursing note reviewed.  Constitutional:      General: She is not in acute distress.    Appearance: Normal appearance. She is not ill-appearing, toxic-appearing or diaphoretic.  HENT:     Head: Normocephalic and atraumatic.     Nose: Nose normal.     Mouth/Throat:     Mouth: Mucous membranes are moist.     Pharynx: Oropharynx is clear.  Eyes:     General: No scleral icterus.    Extraocular Movements: Extraocular movements intact.  Cardiovascular:     Rate and Rhythm: Normal rate and regular rhythm.     Heart sounds: Normal heart sounds. No murmur heard.  No friction rub. No gallop.  Pulmonary:     Effort: Pulmonary effort is normal. No respiratory distress.     Breath sounds: Normal breath sounds. No wheezing, rhonchi or rales.  Abdominal:     General: Bowel sounds are normal. There is no distension.     Palpations: Abdomen is soft.     Tenderness: There is no abdominal tenderness. There is no guarding or rebound.  Musculoskeletal:     Cervical back: Neck supple.     Right lower leg: No edema.     Left lower leg: No edema.  Skin:    General: Skin is warm and dry.     Coloration: Skin is not jaundiced or pale.  Neurological:     General: No focal deficit present.     Mental Status: She is alert and oriented to person, place, and time. Mental status is at baseline.  Psychiatric:        Mood and Affect: Mood normal.        Behavior: Behavior normal.        Thought Content: Thought content normal.        Judgment: Judgment normal.     Laboratory Data Recent Labs  Lab 04/04/24 1153  WBC 7.1  HGB 14.8   HCT 41.1  PLT 258   Recent Labs  Lab 04/04/24 1153  NA 139  K 3.2*  CL 101  CO2 27  BUN 5*  CALCIUM  9.1  PROT 6.9  BILITOT 0.5  ALKPHOS 51  ALT 9  AST 22  GLUCOSE 101*   No results for input(s): INR in the last 168 hours.  Recent Labs    04/04/24 1153  LIPASE 20        Imaging Studies: DG Chest 2 View Result Date: 04/04/2024 EXAM: 2 VIEW(S) XRAY OF THE CHEST 04/04/2024 12:05:00 PM COMPARISON: 11/23/2023 CLINICAL HISTORY: cough FINDINGS: LUNGS AND PLEURA: No focal pulmonary opacity. No pleural effusion. No pneumothorax. HEART AND MEDIASTINUM: Atherosclerotic plaque. BONES AND SOFT TISSUES: No acute osseous abnormality. IMPRESSION: 1. No acute cardiopulmonary process. Electronically signed by: Selinda Blue MD 04/04/2024 01:43 PM EST RP Workstation: [id number]   03/28/2024 Esophagram FINDINGS: Swallowing: Weekend swallow reflex, requiring multiple swallows to clear oral bolus. No vestibular penetration or aspiration seen.   Pharynx: Unremarkable.   Esophagus: Suspect mild to moderate distal esophageal stricture.   Esophageal motility: Moderate esophageal dysmotility, with poor primary peristalsis, notable proximal escape, and tertiary contractions.   Hiatal Hernia: None.   Gastroesophageal reflux: None visualized initially, despite provocative maneuvers.   Ingested 13mm barium tablet: Passed normally   Other: Patient aspirated on water, while attempting to clear bolus of barium. Image 9 demonstrates suspected reflux secondary to patient coughing from suspected aspiration.   IMPRESSION: 1. Suspect mild to moderate distal esophageal stricture. 2. Esophageal dysmotility. 3. Possible reflux and aspiration episode while swallowing water during the exam.   Assessment:   # Dysphagia - progressively worsening from solids to include liquids and pills - pt on prednisone  and alendronate - no nsaids - esophageal dysmotility and tertiary contractions on  imaging - tablet passed normally - ? Additional Oropharyngeal component with water aspiration  # Suspected esophageal stricture - noted on barium swallow  # persistent nausea vomiting 2/2 above  # severe gerd  # RA   # CAD #HTN  Plan:   Esophagogastroduodenoscopy planned for tomorrow pending patient stability and endoscopy suite availability NPO at midnight Clear liquids now Labs in am- bmp, cbc Electrolyte management  per primary team. Potassium should be >3.0 for sedation Protonix  40 mg iv q12 h Hold dvt ppx Supportive care and antiemetics as per primary team Maintain IV access Avoid nsaids Consider speech pathology evaluation pending egd results  Esophagogastroduodenoscopy with possible biopsy, control of bleeding, polypectomy, and interventions as necessary has been discussed with the patient/patient representative. Informed consent was obtained from the patient/patient representative after explaining the indication, nature, and risks of the procedure including but not limited to death, bleeding, perforation, missed neoplasm/lesions, cardiorespiratory compromise, and reaction to medications. Opportunity for questions was given and appropriate answers were provided. Patient/patient representative has verbalized understanding is amenable to undergoing the procedure.   Management of other medical comorbidities as per primary team  I personally performed the service.  Thank you for allowing us  to participate in this patient's care. Please don't hesitate to call if any questions or concerns arise.   Elspeth Ozell Jungling, DO [facility]  Portions of the record may have been created with voice recognition software. Occasional wrong-word or 'sound-a-like' substitutions may have occurred due to the inherent limitations of voice recognition software.  Read the chart carefully and recognize, using context, where substitutions may have occurred.     [1]   Allergies Allergen Reactions   Doxazosin Other (See Comments)    headache   Fish Allergy Anaphylaxis, Itching, Nausea Only and Swelling    She states her tongue turns black.    Dicloxacillin Nausea And Vomiting    Has patient had a PCN reaction causing immediate rash, facial/tongue/throat swelling, SOB or lightheadedness with hypotension: No Has patient had a PCN reaction causing severe rash involving mucus membranes or skin necrosis: No Has patient had a PCN reaction that required hospitalization: Unknown Has patient had a PCN reaction occurring within the last 10 years: Unknown If all of the above answers are NO, then may proceed with Cephalosporin use.   Iodine Hives    Per patient, she had a previous reaction/hives from an iodine injection   Sulfa Antibiotics Swelling    Loss of appetite   Constipation bloating Other reaction(s): Headache    Cardura [Doxazosin Mesylate]     Headache    Etodolac Itching and Swelling   Sulfamethoxazole-Trimethoprim Nausea Only   Zofran  [Ondansetron ]    Azithromycin Itching   Ciprofloxacin  Nausea Only    Pt states she has never had a rash    Clopidogrel  Nausea And Vomiting   Colchicine Nausea Only and Other (See Comments)    Dizzness   Glucosamine Nausea And Vomiting and Other (See Comments)   Leflunomide Diarrhea and Itching   Meloxicam Itching   Methotrexate Nausea Only   Zoloft [Sertraline] Nausea And Vomiting  [2]  Social History Tobacco Use   Smoking status: Never   Smokeless tobacco: Never  Vaping Use   Vaping status: Never Used  Substance Use Topics   Alcohol  use: No   Drug use: No  [3]  No current facility-administered medications on file prior to encounter.   Current Outpatient Medications on File Prior to Encounter  Medication Sig Dispense Refill   acetaminophen  (TYLENOL ) 325 MG tablet Take 2 tablets (650 mg total) by mouth every 6 (six) hours as needed for mild pain (or Fever >/= 101). (Patient taking differently:  Take 1,000 mg by mouth every 6 (six) hours as needed for mild pain (pain score 1-3) (or Fever >/= 101).)     ALPRAZolam  (XANAX ) 0.25 MG tablet Take 0.25 mg by mouth daily.  amLODipine  (NORVASC ) 10 MG tablet Take 10 mg by mouth daily.     aspirin  EC 81 MG EC tablet Take 1 tablet (81 mg total) by mouth daily. Swallow whole.     Biotin 5000 MCG TABS Take 10,000 mcg by mouth daily.     Cholecalciferol (D 1000) 25 MCG (1000 UT) capsule Take 1,000 Units by mouth daily.     ferrous sulfate  325 (65 FE) MG tablet Take 1 tablet (325 mg total) by mouth daily with breakfast.     glimepiride  (AMARYL ) 4 MG tablet Take 4 mg by mouth daily with breakfast.     LANTUS  SOLOSTAR 100 UNIT/ML Solostar Pen 15 Units daily.     metoprolol  succinate (TOPROL -XL) 50 MG 24 hr tablet Take 50 mg by mouth daily.     ondansetron  (ZOFRAN ) 4 MG tablet Take 4 mg by mouth every 8 (eight) hours as needed.     pantoprazole  (PROTONIX ) 20 MG tablet Take 1 tablet (20 mg total) by mouth 2 (two) times daily before meals Take 30 minutes before meals and other medications.     predniSONE  (DELTASONE ) 5 MG tablet Take 7.25 mg by mouth daily with breakfast.     Probiotic Product (ALIGN) 4 MG CAPS Take 8 mg by mouth daily.     rosuvastatin  (CRESTOR ) 5 MG tablet Take 5 mg by mouth daily.     ACCU-CHEK GUIDE test strip      Alcohol  Swabs (B-D SINGLE USE SWABS REGULAR) PADS      Blood Glucose Calibration (TRUE METRIX LEVEL 1) Low SOLN      clopidogrel  (PLAVIX ) 75 MG tablet Take 1 tablet (75 mg total) by mouth daily with breakfast. (Patient not taking: Reported on 04/04/2024) 30 tablet 0   clotrimazole (MYCELEX) 10 MG troche Take 10 mg by mouth 3 (three) times daily. (Patient not taking: Reported on 04/04/2024)     guaiFENesin  (MUCINEX ) 600 MG 12 hr tablet Take 1 tablet (600 mg total) by mouth 2 (two) times daily. (Patient not taking: Reported on 03/24/2024)     RESTASIS  0.05 % ophthalmic emulsion Place 1 drop into both eyes daily as needed (Dry  eyes). (Patient not taking: Reported on 04/04/2024)     rosuvastatin  (CRESTOR ) 10 MG tablet Take 10 mg by mouth daily. (Patient not taking: Reported on 11/23/2023)    [4]  Current Facility-Administered Medications:    0.9 % NaCl with KCl 20 mEq/ L  infusion, , Intravenous, Continuous, [place] Isaiah CROME, NP   acetaminophen  (TYLENOL ) tablet 650 mg, 650 mg, Oral, Q6H PRN **OR** acetaminophen  (TYLENOL ) suppository 650 mg, 650 mg, Rectal, Q6H PRN, Alto Isaiah CROME, NP   ALPRAZolam  (XANAX ) tablet 0.25 mg, 0.25 mg, Oral, Daily, Alto Isaiah CROME, NP   hydrALAZINE  (APRESOLINE ) injection 5 mg, 5 mg, Intravenous, Q6H PRN, Alto Isaiah CROME, NP   insulin  aspart (novoLOG ) injection 0-5 Units, 0-5 Units, Subcutaneous, QHS, Alto Isaiah CROME, NP   insulin  aspart (novoLOG ) injection 0-9 Units, 0-9 Units, Subcutaneous, TID WC, Alto Isaiah CROME, NP   metoprolol  tartrate (LOPRESSOR ) injection 2.5 mg, 2.5 mg, Intravenous, Q8H, Alto Isaiah CROME, NP   pantoprazole  (PROTONIX ) injection 40 mg, 40 mg, Intravenous, Q12H, Alto Isaiah CROME, NP   potassium chloride  10 mEq in 100 mL IVPB, 10 mEq, Intravenous, Q1 Hr x 3, Alto Isaiah CROME, NP   sodium chloride  flush (NS) 0.9 % injection 3 mL, 3 mL, Intravenous, Q12H, Alto Isaiah CROME, NP  Current Outpatient Medications:    acetaminophen  (TYLENOL ) 325 MG tablet, Take  2 tablets (650 mg total) by mouth every 6 (six) hours as needed for mild pain (or Fever >/= 101). (Patient taking differently: Take 1,000 mg by mouth every 6 (six) hours as needed for mild pain (pain score 1-3) (or Fever >/= 101).), Disp: , Rfl:    ALPRAZolam  (XANAX ) 0.25 MG tablet, Take 0.25 mg by mouth daily., Disp: , Rfl:    amLODipine  (NORVASC ) 10 MG tablet, Take 10 mg by mouth daily., Disp: , Rfl:    aspirin  EC 81 MG EC tablet, Take 1 tablet (81 mg total) by mouth daily. Swallow whole., Disp: , Rfl:    Biotin 5000 MCG TABS, Take 10,000 mcg by mouth daily., Disp: , Rfl:    Cholecalciferol (D 1000) 25 MCG (1000 UT)  capsule, Take 1,000 Units by mouth daily., Disp: , Rfl:    ferrous sulfate  325 (65 FE) MG tablet, Take 1 tablet (325 mg total) by mouth daily with breakfast., Disp: , Rfl:    glimepiride  (AMARYL ) 4 MG tablet, Take 4 mg by mouth daily with breakfast., Disp: , Rfl:    LANTUS  SOLOSTAR 100 UNIT/ML Solostar Pen, 15 Units daily., Disp: , Rfl:    metoprolol  succinate (TOPROL -XL) 50 MG 24 hr tablet, Take 50 mg by mouth daily., Disp: , Rfl:    ondansetron  (ZOFRAN ) 4 MG tablet, Take 4 mg by mouth every 8 (eight) hours as needed., Disp: , Rfl:    pantoprazole  (PROTONIX ) 20 MG tablet, Take 1 tablet (20 mg total) by mouth 2 (two) times daily before meals Take 30 minutes before meals and other medications., Disp: , Rfl:    predniSONE  (DELTASONE ) 5 MG tablet, Take 7.25 mg by mouth daily with breakfast., Disp: , Rfl:    Probiotic Product (ALIGN) 4 MG CAPS, Take 8 mg by mouth daily., Disp: , Rfl:    rosuvastatin  (CRESTOR ) 5 MG tablet, Take 5 mg by mouth daily., Disp: , Rfl:    ACCU-CHEK GUIDE test strip, , Disp: , Rfl:    Alcohol  Swabs (B-D SINGLE USE SWABS REGULAR) PADS, , Disp: , Rfl:    Blood Glucose Calibration (TRUE METRIX LEVEL 1) Low SOLN, , Disp: , Rfl:    clopidogrel  (PLAVIX ) 75 MG tablet, Take 1 tablet (75 mg total) by mouth daily with breakfast. (Patient not taking: Reported on 04/04/2024), Disp: 30 tablet, Rfl: 0   clotrimazole (MYCELEX) 10 MG troche, Take 10 mg by mouth 3 (three) times daily. (Patient not taking: Reported on 04/04/2024), Disp: , Rfl:    guaiFENesin  (MUCINEX ) 600 MG 12 hr tablet, Take 1 tablet (600 mg total) by mouth 2 (two) times daily. (Patient not taking: Reported on 03/24/2024), Disp: , Rfl:    RESTASIS  0.05 % ophthalmic emulsion, Place 1 drop into both eyes daily as needed (Dry eyes). (Patient not taking: Reported on 04/04/2024), Disp: , Rfl:    rosuvastatin  (CRESTOR ) 10 MG tablet, Take 10 mg by mouth daily. (Patient not taking: Reported on 11/23/2023), Disp: , Rfl:   "

## 2024-04-04 NOTE — Consult Note (Signed)
 "   Inpatient Consultation   Patient ID: Elaine Cox is a 78 y.o. female.  Requesting Provider: Rolland Moats, MD  Date of Admission: 04/04/2024  Date of Consult: 04/04/2024   Reason for Consultation: dysphagia   Patient's Chief Complaint:   Chief Complaint  Patient presents with   Gastroesophageal Reflux    78 year old AAF with RA, history of breast cancer status postchemotherapy, CAD, GERD, DM2-ID, anxiety who presents to the hospital with worsening dysphagia.  GI is consulted for this.  Patient has had longstanding GERD for multiple years and has been on multiple agents for this.  Over the last 6 to 8 weeks she has noted progressive dysphagia going from solids to that include liquids and pills.  No outright odynophagia.  She underwent an outpatient esophagram revealing esophageal narrowing.  She is try to consume things like applesauce, but these were regurgitated back up.  No odynophagia.  Currently on Protonix  20 mg twice a day at home  Was recently hospitalized after starting Enbrel and being intolerant of this.  At that point in time she had lost approximately 10 pounds.  Has been on longstanding prednisone  as well as alendronate.  Denies any doxycycline or NSAID use.  Bowels otherwise regular without melena or hematochezia.  She does note that she has been coughing since this weekend. Some family members at home with URI sx. CXR nml here  Denies NSAIDs, Anti-plt agents, and anticoagulants Denies family history of gastrointestinal disease and malignancy Previous Endoscopies: 2019 [facility]     Past Medical History:  Diagnosis Date   Anginal pain 06/2016   being followed by dr. bosie   Anxiety    Arthritis    Osteoarthritis   Arthritis    Rheumatoid   Breast cancer (HCC) 2000   Right Breast - Chemotherapy   Collagen vascular disease    Rheumatoid Arthritis.   Coronary artery disease    Diabetes mellitus without complication [facility])    Patient takes Metformin     Dyspnea 03/2017   GERD (gastroesophageal reflux disease)    History of abdominal hysterectomy    History of eyelid surgery    History of kidney stones    Hyperlipidemia    Hypertension    Lumbago    Murmur    Myocardial infarction (HCC) 07/2010   Orthopnea 03/2017   Personal history of chemotherapy 2000   right breast ca   Sinus disorder    Stroke [facility]) 2006, 2012   Wears dentures    full upper and lower    Past Surgical History:  Procedure Laterality Date   ABDOMINAL HYSTERECTOMY     BREAST SURGERY Right    mastectomy    CARDIAC CATHETERIZATION  2012   stent placed   CATARACT EXTRACTION W/PHACO Right 06/24/2017   Procedure: CATARACT EXTRACTION PHACO AND INTRAOCULAR LENS PLACEMENT (IOC) COMPLICATED RIGHT DIABETIC;  Surgeon: Mittie Gaskin, MD;  Location: [facility];  Service: Ophthalmology;  Laterality: Right;  Diabetes - oral med   COLONOSCOPY     COLONOSCOPY WITH PROPOFOL  N/A 12/08/2017   Procedure: COLONOSCOPY WITH PROPOFOL ;  Surgeon: Toledo, Ladell POUR, MD;  Location: [facility] ENDOSCOPY;  Service: Gastroenterology;  Laterality: N/A;   CORONARY ANGIOPLASTY     CORONARY STENT INTERVENTION N/A 02/24/2018   Procedure: CORONARY STENT INTERVENTION;  Surgeon: Ammon Blunt, MD;  Location: [facility] INVASIVE CV LAB;  Service: Cardiovascular;  Laterality: N/A;   CORONARY STENT INTERVENTION N/A 03/25/2023   Procedure: CORONARY STENT INTERVENTION;  Surgeon: Ammon Blunt, MD;  Location: [facility] INVASIVE CV LAB;  Service: Cardiovascular;  Laterality: N/A;   CYSTOSCOPY N/A 09/08/2016   Procedure: CYSTOSCOPY;  Surgeon: Ward, Mitzie BROCKS, MD;  Location: [facility] ORS;  Service: Gynecology;  Laterality: N/A;   EYE SURGERY Left    Cataract Extraction with IOL   KNEE ARTHROPLASTY Right 03/14/2020   Procedure: COMPUTER ASSISTED TOTAL KNEE ARTHROPLASTY;  Surgeon: Mardee Lynwood SQUIBB, MD;  Location: [facility] ORS;  Service: Orthopedics;  Laterality: Right;  FOLLOWING 1ST CASE   KNEE ARTHROSCOPY W/  MENISCAL REPAIR Right    LAPAROSCOPIC BILATERAL SALPINGO OOPHERECTOMY Bilateral 09/08/2016   Procedure: LAPAROSCOPIC BILATERAL SALPINGO OOPHORECTOMY;  Surgeon: Ward, Mitzie BROCKS, MD;  Location: [facility] ORS;  Service: Gynecology;  Laterality: Bilateral;   LEFT HEART CATH AND CORONARY ANGIOGRAPHY Left 02/24/2018   Procedure: LEFT HEART CATH AND CORONARY ANGIOGRAPHY;  Surgeon: Ammon Blunt, MD;  Location: [facility] INVASIVE CV LAB;  Service: Cardiovascular;  Laterality: Left;   LEFT HEART CATH AND CORONARY ANGIOGRAPHY Left 03/25/2023   Procedure: LEFT HEART CATH AND CORONARY ANGIOGRAPHY;  Surgeon: Ammon Blunt, MD;  Location: [facility] INVASIVE CV LAB;  Service: Cardiovascular;  Laterality: Left;   MASTECTOMY Right 2000   BREAST CA    Allergies[1]  Family History  Problem Relation Age of Onset   Diabetes type II Mother    Hypertension Mother    Colon polyps Mother    Breast cancer Maternal Aunt     Social History[2]   Pertinent GI related history and allergies were reviewed with the patient  Review of Systems  Constitutional:  Positive for appetite change. Negative for activity change, chills, diaphoresis, fatigue, fever and unexpected weight change.  HENT:  Positive for trouble swallowing. Negative for voice change.   Respiratory:  Negative for shortness of breath and wheezing.   Cardiovascular:  Negative for chest pain, palpitations and leg swelling.  Gastrointestinal:  Positive for nausea and vomiting. Negative for abdominal distention, abdominal pain, anal bleeding, blood in stool, constipation, diarrhea and rectal pain.  Skin:  Negative for color change and pallor.  Neurological:  Negative for dizziness, syncope and weakness.  Psychiatric/Behavioral:  Negative for confusion.   All other systems reviewed and are negative.    Medications Home Medications Medications Ordered Prior to Encounter[3] Pertinent GI related medications were reviewed with the patient  Inpatient  Medications Current Medications[4]  0.9 % NaCl with KCl 20 mEq / L     potassium chloride       acetaminophen  **OR** acetaminophen , hydrALAZINE    Objective   Vitals:   04/04/24 1139 04/04/24 1141  BP: 115/87   Pulse: 90   Resp: 18   Temp: 98.4 F (36.9 C)   TempSrc: Oral   SpO2: 97%   Weight:  78 kg  Height:  5' 2 (1.575 m)     Physical Exam Vitals and nursing note reviewed.  Constitutional:      General: She is not in acute distress.    Appearance: Normal appearance. She is not ill-appearing, toxic-appearing or diaphoretic.  HENT:     Head: Normocephalic and atraumatic.     Nose: Nose normal.     Mouth/Throat:     Mouth: Mucous membranes are moist.     Pharynx: Oropharynx is clear.  Eyes:     General: No scleral icterus.    Extraocular Movements: Extraocular movements intact.  Cardiovascular:     Rate and Rhythm: Normal rate and regular rhythm.     Heart sounds: Normal heart sounds. No murmur heard.  No friction rub. No gallop.  Pulmonary:     Effort: Pulmonary effort is normal. No respiratory distress.     Breath sounds: Normal breath sounds. No wheezing, rhonchi or rales.  Abdominal:     General: Bowel sounds are normal. There is no distension.     Palpations: Abdomen is soft.     Tenderness: There is no abdominal tenderness. There is no guarding or rebound.  Musculoskeletal:     Cervical back: Neck supple.     Right lower leg: No edema.     Left lower leg: No edema.  Skin:    General: Skin is warm and dry.     Coloration: Skin is not jaundiced or pale.  Neurological:     General: No focal deficit present.     Mental Status: She is alert and oriented to person, place, and time. Mental status is at baseline.  Psychiatric:        Mood and Affect: Mood normal.        Behavior: Behavior normal.        Thought Content: Thought content normal.        Judgment: Judgment normal.     Laboratory Data Recent Labs  Lab 04/04/24 1153  WBC 7.1  HGB 14.8   HCT 41.1  PLT 258   Recent Labs  Lab 04/04/24 1153  NA 139  K 3.2*  CL 101  CO2 27  BUN 5*  CALCIUM  9.1  PROT 6.9  BILITOT 0.5  ALKPHOS 51  ALT 9  AST 22  GLUCOSE 101*   No results for input(s): INR in the last 168 hours.  Recent Labs    04/04/24 1153  LIPASE 20        Imaging Studies: DG Chest 2 View Result Date: 04/04/2024 EXAM: 2 VIEW(S) XRAY OF THE CHEST 04/04/2024 12:05:00 PM COMPARISON: 11/23/2023 CLINICAL HISTORY: cough FINDINGS: LUNGS AND PLEURA: No focal pulmonary opacity. No pleural effusion. No pneumothorax. HEART AND MEDIASTINUM: Atherosclerotic plaque. BONES AND SOFT TISSUES: No acute osseous abnormality. IMPRESSION: 1. No acute cardiopulmonary process. Electronically signed by: Selinda Blue MD 04/04/2024 01:43 PM EST RP Workstation: [id number]   03/28/2024 Esophagram FINDINGS: Swallowing: Weekend swallow reflex, requiring multiple swallows to clear oral bolus. No vestibular penetration or aspiration seen.   Pharynx: Unremarkable.   Esophagus: Suspect mild to moderate distal esophageal stricture.   Esophageal motility: Moderate esophageal dysmotility, with poor primary peristalsis, notable proximal escape, and tertiary contractions.   Hiatal Hernia: None.   Gastroesophageal reflux: None visualized initially, despite provocative maneuvers.   Ingested 13mm barium tablet: Passed normally   Other: Patient aspirated on water, while attempting to clear bolus of barium. Image 9 demonstrates suspected reflux secondary to patient coughing from suspected aspiration.   IMPRESSION: 1. Suspect mild to moderate distal esophageal stricture. 2. Esophageal dysmotility. 3. Possible reflux and aspiration episode while swallowing water during the exam.   Assessment:   # Dysphagia - progressively worsening from solids to include liquids and pills - pt on prednisone  and alendronate - no nsaids - esophageal dysmotility and tertiary contractions on  imaging - tablet passed normally - ? Additional Oropharyngeal component with water aspiration  # Suspected esophageal stricture - noted on barium swallow  # persistent nausea vomiting 2/2 above  # severe gerd  # RA   # CAD #HTN  Plan:   Esophagogastroduodenoscopy planned for tomorrow pending patient stability and endoscopy suite availability NPO at midnight Clear liquids now Labs in am- bmp, cbc Electrolyte management  per primary team. Potassium should be >3.0 for sedation Protonix  40 mg iv q12 h Hold dvt ppx Supportive care and antiemetics as per primary team Maintain IV access Avoid nsaids Consider speech pathology evaluation pending egd results  Esophagogastroduodenoscopy with possible biopsy, control of bleeding, polypectomy, and interventions as necessary has been discussed with the patient/patient representative. Informed consent was obtained from the patient/patient representative after explaining the indication, nature, and risks of the procedure including but not limited to death, bleeding, perforation, missed neoplasm/lesions, cardiorespiratory compromise, and reaction to medications. Opportunity for questions was given and appropriate answers were provided. Patient/patient representative has verbalized understanding is amenable to undergoing the procedure.   Management of other medical comorbidities as per primary team  I personally performed the service.  Thank you for allowing us  to participate in this patient's care. Please don't hesitate to call if any questions or concerns arise.   Elspeth Ozell Jungling, DO [facility]  Portions of the record may have been created with voice recognition software. Occasional wrong-word or 'sound-a-like' substitutions may have occurred due to the inherent limitations of voice recognition software.  Read the chart carefully and recognize, using context, where substitutions may have occurred.     [1]   Allergies Allergen Reactions   Doxazosin Other (See Comments)    headache   Fish Allergy Anaphylaxis, Itching, Nausea Only and Swelling    She states her tongue turns black.    Dicloxacillin Nausea And Vomiting    Has patient had a PCN reaction causing immediate rash, facial/tongue/throat swelling, SOB or lightheadedness with hypotension: No Has patient had a PCN reaction causing severe rash involving mucus membranes or skin necrosis: No Has patient had a PCN reaction that required hospitalization: Unknown Has patient had a PCN reaction occurring within the last 10 years: Unknown If all of the above answers are NO, then may proceed with Cephalosporin use.   Iodine Hives    Per patient, she had a previous reaction/hives from an iodine injection   Sulfa Antibiotics Swelling    Loss of appetite   Constipation bloating Other reaction(s): Headache    Cardura [Doxazosin Mesylate]     Headache    Etodolac Itching and Swelling   Sulfamethoxazole-Trimethoprim Nausea Only   Zofran  [Ondansetron ]    Azithromycin Itching   Ciprofloxacin  Nausea Only    Pt states she has never had a rash    Clopidogrel  Nausea And Vomiting   Colchicine Nausea Only and Other (See Comments)    Dizzness   Glucosamine Nausea And Vomiting and Other (See Comments)   Leflunomide Diarrhea and Itching   Meloxicam Itching   Methotrexate Nausea Only   Zoloft [Sertraline] Nausea And Vomiting  [2]  Social History Tobacco Use   Smoking status: Never   Smokeless tobacco: Never  Vaping Use   Vaping status: Never Used  Substance Use Topics   Alcohol  use: No   Drug use: No  [3]  No current facility-administered medications on file prior to encounter.   Current Outpatient Medications on File Prior to Encounter  Medication Sig Dispense Refill   acetaminophen  (TYLENOL ) 325 MG tablet Take 2 tablets (650 mg total) by mouth every 6 (six) hours as needed for mild pain (or Fever >/= 101). (Patient taking differently:  Take 1,000 mg by mouth every 6 (six) hours as needed for mild pain (pain score 1-3) (or Fever >/= 101).)     ALPRAZolam  (XANAX ) 0.25 MG tablet Take 0.25 mg by mouth daily.  amLODipine  (NORVASC ) 10 MG tablet Take 10 mg by mouth daily.     aspirin  EC 81 MG EC tablet Take 1 tablet (81 mg total) by mouth daily. Swallow whole.     Biotin 5000 MCG TABS Take 10,000 mcg by mouth daily.     Cholecalciferol (D 1000) 25 MCG (1000 UT) capsule Take 1,000 Units by mouth daily.     ferrous sulfate  325 (65 FE) MG tablet Take 1 tablet (325 mg total) by mouth daily with breakfast.     glimepiride  (AMARYL ) 4 MG tablet Take 4 mg by mouth daily with breakfast.     LANTUS  SOLOSTAR 100 UNIT/ML Solostar Pen 15 Units daily.     metoprolol  succinate (TOPROL -XL) 50 MG 24 hr tablet Take 50 mg by mouth daily.     ondansetron  (ZOFRAN ) 4 MG tablet Take 4 mg by mouth every 8 (eight) hours as needed.     pantoprazole  (PROTONIX ) 20 MG tablet Take 1 tablet (20 mg total) by mouth 2 (two) times daily before meals Take 30 minutes before meals and other medications.     predniSONE  (DELTASONE ) 5 MG tablet Take 7.25 mg by mouth daily with breakfast.     Probiotic Product (ALIGN) 4 MG CAPS Take 8 mg by mouth daily.     rosuvastatin  (CRESTOR ) 5 MG tablet Take 5 mg by mouth daily.     ACCU-CHEK GUIDE test strip      Alcohol  Swabs (B-D SINGLE USE SWABS REGULAR) PADS      Blood Glucose Calibration (TRUE METRIX LEVEL 1) Low SOLN      clopidogrel  (PLAVIX ) 75 MG tablet Take 1 tablet (75 mg total) by mouth daily with breakfast. (Patient not taking: Reported on 04/04/2024) 30 tablet 0   clotrimazole (MYCELEX) 10 MG troche Take 10 mg by mouth 3 (three) times daily. (Patient not taking: Reported on 04/04/2024)     guaiFENesin  (MUCINEX ) 600 MG 12 hr tablet Take 1 tablet (600 mg total) by mouth 2 (two) times daily. (Patient not taking: Reported on 03/24/2024)     RESTASIS  0.05 % ophthalmic emulsion Place 1 drop into both eyes daily as needed (Dry  eyes). (Patient not taking: Reported on 04/04/2024)     rosuvastatin  (CRESTOR ) 10 MG tablet Take 10 mg by mouth daily. (Patient not taking: Reported on 11/23/2023)    [4]  Current Facility-Administered Medications:    0.9 % NaCl with KCl 20 mEq/ L  infusion, , Intravenous, Continuous, [place] Isaiah CROME, NP   acetaminophen  (TYLENOL ) tablet 650 mg, 650 mg, Oral, Q6H PRN **OR** acetaminophen  (TYLENOL ) suppository 650 mg, 650 mg, Rectal, Q6H PRN, Alto Isaiah CROME, NP   ALPRAZolam  (XANAX ) tablet 0.25 mg, 0.25 mg, Oral, Daily, Alto Isaiah CROME, NP   hydrALAZINE  (APRESOLINE ) injection 5 mg, 5 mg, Intravenous, Q6H PRN, Alto Isaiah CROME, NP   insulin  aspart (novoLOG ) injection 0-5 Units, 0-5 Units, Subcutaneous, QHS, Alto Isaiah CROME, NP   insulin  aspart (novoLOG ) injection 0-9 Units, 0-9 Units, Subcutaneous, TID WC, Alto Isaiah CROME, NP   metoprolol  tartrate (LOPRESSOR ) injection 2.5 mg, 2.5 mg, Intravenous, Q8H, Alto Isaiah CROME, NP   pantoprazole  (PROTONIX ) injection 40 mg, 40 mg, Intravenous, Q12H, Alto Isaiah CROME, NP   potassium chloride  10 mEq in 100 mL IVPB, 10 mEq, Intravenous, Q1 Hr x 3, Alto Isaiah CROME, NP   sodium chloride  flush (NS) 0.9 % injection 3 mL, 3 mL, Intravenous, Q12H, Alto Isaiah CROME, NP  Current Outpatient Medications:    acetaminophen  (TYLENOL ) 325 MG tablet, Take  2 tablets (650 mg total) by mouth every 6 (six) hours as needed for mild pain (or Fever >/= 101). (Patient taking differently: Take 1,000 mg by mouth every 6 (six) hours as needed for mild pain (pain score 1-3) (or Fever >/= 101).), Disp: , Rfl:    ALPRAZolam  (XANAX ) 0.25 MG tablet, Take 0.25 mg by mouth daily., Disp: , Rfl:    amLODipine  (NORVASC ) 10 MG tablet, Take 10 mg by mouth daily., Disp: , Rfl:    aspirin  EC 81 MG EC tablet, Take 1 tablet (81 mg total) by mouth daily. Swallow whole., Disp: , Rfl:    Biotin 5000 MCG TABS, Take 10,000 mcg by mouth daily., Disp: , Rfl:    Cholecalciferol (D 1000) 25 MCG (1000 UT)  capsule, Take 1,000 Units by mouth daily., Disp: , Rfl:    ferrous sulfate  325 (65 FE) MG tablet, Take 1 tablet (325 mg total) by mouth daily with breakfast., Disp: , Rfl:    glimepiride  (AMARYL ) 4 MG tablet, Take 4 mg by mouth daily with breakfast., Disp: , Rfl:    LANTUS  SOLOSTAR 100 UNIT/ML Solostar Pen, 15 Units daily., Disp: , Rfl:    metoprolol  succinate (TOPROL -XL) 50 MG 24 hr tablet, Take 50 mg by mouth daily., Disp: , Rfl:    ondansetron  (ZOFRAN ) 4 MG tablet, Take 4 mg by mouth every 8 (eight) hours as needed., Disp: , Rfl:    pantoprazole  (PROTONIX ) 20 MG tablet, Take 1 tablet (20 mg total) by mouth 2 (two) times daily before meals Take 30 minutes before meals and other medications., Disp: , Rfl:    predniSONE  (DELTASONE ) 5 MG tablet, Take 7.25 mg by mouth daily with breakfast., Disp: , Rfl:    Probiotic Product (ALIGN) 4 MG CAPS, Take 8 mg by mouth daily., Disp: , Rfl:    rosuvastatin  (CRESTOR ) 5 MG tablet, Take 5 mg by mouth daily., Disp: , Rfl:    ACCU-CHEK GUIDE test strip, , Disp: , Rfl:    Alcohol  Swabs (B-D SINGLE USE SWABS REGULAR) PADS, , Disp: , Rfl:    Blood Glucose Calibration (TRUE METRIX LEVEL 1) Low SOLN, , Disp: , Rfl:    clopidogrel  (PLAVIX ) 75 MG tablet, Take 1 tablet (75 mg total) by mouth daily with breakfast. (Patient not taking: Reported on 04/04/2024), Disp: 30 tablet, Rfl: 0   clotrimazole (MYCELEX) 10 MG troche, Take 10 mg by mouth 3 (three) times daily. (Patient not taking: Reported on 04/04/2024), Disp: , Rfl:    guaiFENesin  (MUCINEX ) 600 MG 12 hr tablet, Take 1 tablet (600 mg total) by mouth 2 (two) times daily. (Patient not taking: Reported on 03/24/2024), Disp: , Rfl:    RESTASIS  0.05 % ophthalmic emulsion, Place 1 drop into both eyes daily as needed (Dry eyes). (Patient not taking: Reported on 04/04/2024), Disp: , Rfl:    rosuvastatin  (CRESTOR ) 10 MG tablet, Take 10 mg by mouth daily. (Patient not taking: Reported on 11/23/2023), Disp: , Rfl:   "

## 2024-04-04 NOTE — ED Provider Notes (Signed)
 "  [facility] Provider Note    None    (approximate)   History   Gastroesophageal Reflux   HPI  Elaine Cox is a 78 y.o. female  hypertension, diabetes, CAD, rheumatoid arthritis, breast cancer status postchemotherapy, prior CVA, anxiety, obesity, who presents to the emergency department with 3 weeks of progressively worsening dysphagia, cough.  Patient tells me that she initially had difficulties tolerating solids but now has progressed to difficulty swallowing liquids as well.  The only thing she ate this morning was a little bit of applesauce and she was not able to get that down without significant coughing.  She did have an outpatient barium swallow which demonstrated a moderate esophageal stricture but has not yet establish care with gastroenterology.  Denies any fevers, changes in urinary or bowel habits.      Physical Exam   Triage Vital Signs: ED Triage Vitals  Encounter Vitals Group     BP 04/04/24 1139 115/87     Girls Systolic BP Percentile --      Girls Diastolic BP Percentile --      Boys Systolic BP Percentile --      Boys Diastolic BP Percentile --      Pulse Rate 04/04/24 1139 90     Resp 04/04/24 1139 18     Temp 04/04/24 1139 98.4 F (36.9 C)     Temp Source 04/04/24 1139 Oral     SpO2 04/04/24 1139 97 %     Weight 04/04/24 1141 172 lb (78 kg)     Height 04/04/24 1141 5' 2 (1.575 m)     Head Circumference --      Peak Flow --      Pain Score 04/04/24 1140 8     Pain Loc --      Pain Education --      Exclude from Growth Chart --     Most recent vital signs: Vitals:   04/04/24 1139  BP: 115/87  Pulse: 90  Resp: 18  Temp: 98.4 F (36.9 C)  SpO2: 97%    Nursing Triage Note reviewed. Vital signs reviewed and patients oxygen saturation is normoxic  General: Patient is well nourished, well developed, awake and alert, appears unwell Head: Normocephalic and atraumatic Eyes: Normal inspection, extraocular muscles  intact, no conjunctival pallor Ear, nose, throat: Normal external exam No intraoral findings Neck: Normal range of motion Respiratory: Patient is in no respiratory distress, lungs CTAB Cardiovascular: Patient is not tachycardic, RRR without murmur appreciated GI: Abd SNT with no guarding or rebound  Back: Normal inspection of the back with good strength and range of motion throughout all ext Extremities: pulses intact with good cap refills, no LE pitting edema or calf tenderness Neuro: The patient is alert and oriented to person, place, and time, appropriately conversive, with 5/5 bilat UE/LE strength, no gross motor or sensory defects noted. Coordination appears to be adequate. Skin: Warm, dry, and intact Psych: normal mood and affect, no SI or HI  ED Results / Procedures / Treatments   Labs (all labs ordered are listed, but only abnormal results are displayed) Labs Reviewed  COMPREHENSIVE METABOLIC PANEL WITH GFR - Abnormal; Notable for the following components:      Result Value   Potassium 3.2 (*)    Glucose, Bld 101 (*)    BUN 5 (*)    All other components within normal limits  LIPASE, BLOOD  CBC  URINALYSIS, ROUTINE W REFLEX MICROSCOPIC  TROPONIN  T, HIGH SENSITIVITY  TROPONIN T, HIGH SENSITIVITY     EKG EKG and rhythm strip are interpreted by myself:   EKG: [Normal sinus rhythm] at heart rate of 86, normal QRS duration, QTc 452, nonspecific ST segments and T waves no ectopy EKG not consistent with Acute STEMI Rhythm strip: NSR in lead II   RADIOLOGY CXR: No acute abnormality on my independent review interpretation radiologist agrees    PROCEDURES:  Critical Care performed: No  Procedures   MEDICATIONS ORDERED IN ED: Medications  famotidine  (PEPCID ) IVPB 20 mg premix (has no administration in time range)  sodium chloride  0.9 % bolus 500 mL (has no administration in time range)     IMPRESSION / MDM / ASSESSMENT AND PLAN / ED COURSE                                 Differential diagnosis includes, but is not limited to: Esophageal stricture, pneumonia, electrolyte derangement anemia   ED course: Patient appears profoundly symptomatic and is choking on secretions.  Chest x-ray was completely unremarkable and EKG demonstrated no evidence of acute ischemia.  Troponin was not elevated.  She had no leukocytosis and no profound electrolyte derangements.  I did review the barium swallow study from 03/2021 and this did demonstrate a moderate stricture.  Case discussed with on-call gastroenterologist Dr. Onita and plan for EGD tomorrow.  He recommends hospitalist admission regimen agreeable with at this time.  Patient and agreeable with plan and case discussed with hospitalist for admission   Clinical Course as of 04/04/24 1440  Mon Apr 04, 2024  1350 Paging gastroenterology [HD]  1359 Case discussed with Dr. Onita of gastroenterology.  He recommends n.p.o. at midnight okay from clears from his standpoint and he will plan to scope in the morning.  Recommends hospitalist admission [HD]    Clinical Course User Index [HD] Nicholaus Rolland BRAVO, MD   -- Risk: 5 This patient has a high risk of morbidity due to further diagnostic testing or treatment. Rationale: This patients evaluation and management involve a high risk of morbidity due to the potential severity of presenting symptoms, need for diagnostic testing, and/or initiation of treatment that may require close monitoring. The differential includes conditions with potential for significant deterioration or requiring escalation of care. Treatment decisions in the ED, including medication administration, procedural interventions, or disposition planning, reflect this level of risk. COPA: 5 The patient has the following acute or chronic illness/injury that poses a possible threat to life or bodily function: [X] : The patient has a potentially serious acute condition or an acute exacerbation of a chronic illness  requiring urgent evaluation and management in the Emergency Department. The clinical presentation necessitates immediate consideration of life-threatening or function-threatening diagnoses, even if they are ultimately ruled out.   FINAL CLINICAL IMPRESSION(S) / ED DIAGNOSES   Final diagnoses:  Esophageal stricture  Dysphagia, unspecified type     Rx / DC Orders   ED Discharge Orders     None        Note:  This document was prepared using Dragon voice recognition software and may include unintentional dictation errors.   Nicholaus Rolland BRAVO, MD 04/04/24 1440  "

## 2024-04-05 ENCOUNTER — Observation Stay: Admitting: Anesthesiology

## 2024-04-05 ENCOUNTER — Encounter: Payer: Self-pay | Admitting: Internal Medicine

## 2024-04-05 ENCOUNTER — Encounter: Admission: EM | Disposition: A | Payer: Self-pay | Source: Home / Self Care

## 2024-04-05 ENCOUNTER — Ambulatory Visit: Admitting: Anesthesiology

## 2024-04-05 DIAGNOSIS — R112 Nausea with vomiting, unspecified: Secondary | ICD-10-CM | POA: Diagnosis not present

## 2024-04-05 HISTORY — PX: ESOPHAGEAL DILATION: SHX303

## 2024-04-05 LAB — BASIC METABOLIC PANEL WITH GFR
Anion gap: 9 (ref 5–15)
BUN: 6 mg/dL — ABNORMAL LOW (ref 8–23)
CO2: 25 mmol/L (ref 22–32)
Calcium: 8.1 mg/dL — ABNORMAL LOW (ref 8.9–10.3)
Chloride: 106 mmol/L (ref 98–111)
Creatinine, Ser: 0.53 mg/dL (ref 0.44–1.00)
GFR, Estimated: >60 mL/min
Glucose, Bld: 56 mg/dL — ABNORMAL LOW (ref 70–99)
Potassium: 3.4 mmol/L — ABNORMAL LOW (ref 3.5–5.1)
Sodium: 140 mmol/L (ref 135–145)

## 2024-04-05 LAB — HEMOGLOBIN A1C
Hgb A1c MFr Bld: 8.3 % — ABNORMAL HIGH (ref 4.8–5.6)
Mean Plasma Glucose: 191.51 mg/dL

## 2024-04-05 LAB — URINALYSIS, ROUTINE W REFLEX MICROSCOPIC
Bilirubin Urine: NEGATIVE
Glucose, UA: NEGATIVE mg/dL
Hgb urine dipstick: NEGATIVE
Ketones, ur: NEGATIVE mg/dL
Leukocytes,Ua: NEGATIVE
Nitrite: NEGATIVE
Protein, ur: NEGATIVE mg/dL
Specific Gravity, Urine: 1.008 (ref 1.005–1.030)
pH: 7 (ref 5.0–8.0)

## 2024-04-05 LAB — GLUCOSE, CAPILLARY
Glucose-Capillary: 149 mg/dL — ABNORMAL HIGH (ref 70–99)
Glucose-Capillary: 53 mg/dL — ABNORMAL LOW (ref 70–99)
Glucose-Capillary: 111 mg/dL — ABNORMAL HIGH (ref 70–99)
Glucose-Capillary: 135 mg/dL — ABNORMAL HIGH (ref 70–99)
Glucose-Capillary: 59 mg/dL — ABNORMAL LOW (ref 70–99)
Glucose-Capillary: 70 mg/dL (ref 70–99)
Glucose-Capillary: 128 mg/dL — ABNORMAL HIGH (ref 70–99)
Glucose-Capillary: 204 mg/dL — ABNORMAL HIGH (ref 70–99)

## 2024-04-05 MED ORDER — ENSURE PLUS HIGH PROTEIN PO LIQD: 237.0000 mL | Freq: Two times a day (BID) | ORAL | Status: DC

## 2024-04-05 MED ORDER — DEXTROSE-SODIUM CHLORIDE 5-0.45 % IV SOLN: INTRAVENOUS | Status: AC

## 2024-04-05 MED ORDER — THIAMINE HCL 100 MG PO TABS
100.0000 mg | ORAL_TABLET | Freq: Every day | ORAL | Status: DC
  Administered 2024-04-06: 100 mg via ORAL
  Filled 2024-04-05 (×2): qty 1

## 2024-04-05 MED ORDER — PROPOFOL 500 MG/50ML IV EMUL
INTRAVENOUS | Status: DC | PRN
  Administered 2024-04-05 (×2): 50 mg via INTRAVENOUS
  Administered 2024-04-05: 125 ug/kg/min via INTRAVENOUS

## 2024-04-05 MED ORDER — DEXTROSE 50 % IV SOLN
25.0000 g | INTRAVENOUS | Status: AC
  Administered 2024-04-05: 25 g via INTRAVENOUS
  Filled 2024-04-05: qty 50

## 2024-04-05 MED ORDER — PREDNISONE 5 MG PO TABS
7.2500 mg | ORAL_TABLET | Freq: Every day | ORAL | Status: DC
  Administered 2024-04-05 – 2024-04-06 (×2): 7.25 mg via ORAL
  Filled 2024-04-05 (×2): qty 1

## 2024-04-05 MED ORDER — PREDNISONE 5 MG PO TABS: 7.2500 mg | ORAL_TABLET | Freq: Every day | ORAL | Status: DC

## 2024-04-05 MED ORDER — LIDOCAINE VISCOUS HCL 2 % MT SOLN
15.0000 mL | OROMUCOSAL | Status: DC | PRN
  Filled 2024-04-05: qty 15

## 2024-04-05 MED ORDER — DEXTROSE 50 % IV SOLN
12.5000 g | Freq: Once | INTRAVENOUS | Status: AC
  Administered 2024-04-05: 12.5 g via INTRAVENOUS

## 2024-04-05 MED ORDER — ENSURE PLUS HIGH PROTEIN PO LIQD
237.0000 mL | Freq: Three times a day (TID) | ORAL | Status: DC
  Administered 2024-04-05: 237 mL via ORAL

## 2024-04-05 MED ORDER — SODIUM CHLORIDE 0.9 % IV SOLN: INTRAVENOUS | Status: DC | PRN

## 2024-04-05 MED ORDER — METOPROLOL SUCCINATE ER 50 MG PO TB24: 50.0000 mg | ORAL_TABLET | Freq: Every day | ORAL | Status: DC

## 2024-04-05 MED ORDER — BENZONATATE 100 MG PO CAPS
100.0000 mg | ORAL_CAPSULE | Freq: Three times a day (TID) | ORAL | Status: DC
  Administered 2024-04-05 – 2024-04-06 (×3): 100 mg via ORAL
  Filled 2024-04-05 (×4): qty 1

## 2024-04-05 MED ORDER — DEXTROSE 50 % IV SOLN
25.0000 g | INTRAVENOUS | Status: AC
  Filled 2024-04-05: qty 50

## 2024-04-05 MED ORDER — ADULT MULTIVITAMIN W/MINERALS CH
1.0000 | ORAL_TABLET | Freq: Every day | ORAL | Status: DC
  Administered 2024-04-06: 1 via ORAL
  Filled 2024-04-05: qty 1

## 2024-04-05 MED ORDER — HYDROCOD POLI-CHLORPHE POLI ER 10-8 MG/5ML PO SUER
5.0000 mL | Freq: Two times a day (BID) | ORAL | Status: DC | PRN
  Administered 2024-04-05 – 2024-04-06 (×2): 5 mL via ORAL
  Filled 2024-04-05 (×2): qty 5

## 2024-04-05 MED ORDER — PANTOPRAZOLE SODIUM 20 MG PO TBEC
20.0000 mg | DELAYED_RELEASE_TABLET | Freq: Two times a day (BID) | ORAL | Status: DC
  Administered 2024-04-05 – 2024-04-06 (×2): 20 mg via ORAL
  Filled 2024-04-05 (×2): qty 1

## 2024-04-05 MED ORDER — LIDOCAINE HCL (PF) 2 % IJ SOLN
INTRAMUSCULAR | Status: DC | PRN
  Administered 2024-04-05: 100 mg via INTRADERMAL

## 2024-04-05 NOTE — Progress Notes (Signed)
 Hypoglycemic Event  CBG: 59  Treatment: D50 25 mL (12.5 gm)  Symptoms: None  Follow-up CBG: Time:1127 CBG Result:135  Possible Reasons for Event: Inadequate meal intake  Comments/MD notified: Notified Dr Jhonny of multiple hypoglycemic events occurring for this patient to include the present, new orders for d5.45NS @ 90ml/hr for patient. New fluids initiated.     Suni Jarnagin D Joaovictor Krone

## 2024-04-05 NOTE — Anesthesia Postprocedure Evaluation (Signed)
"   Anesthesia Post Note  Patient: Elaine Cox  Procedure(s) Performed: ESOPHAGOSCOPY, WITH ESOPHAGEAL DILATION  Patient location during evaluation: PACU Anesthesia Type: General Level of consciousness: awake and alert Pain management: pain level controlled Vital Signs Assessment: post-procedure vital signs reviewed and stable Respiratory status: spontaneous breathing, nonlabored ventilation and respiratory function stable Cardiovascular status: blood pressure returned to baseline and stable Postop Assessment: no apparent nausea or vomiting Anesthetic complications: yes   Encounter Notable Events  Notable Event Outcome Phase Comment  Desaturation < 90% for over 3 min or < 80% for over 1 min  Intraprocedure OAW placed and BVM uitlized     Last Vitals:  Vitals:   04/05/24 1316 04/05/24 1350  BP: (!) 146/68 134/62  Pulse: 72 69  Resp: 16 18  Temp:  36.8 C  SpO2: 100% 99%    Last Pain:  Vitals:   04/05/24 1411  TempSrc:   PainSc: 3                  [name]      "

## 2024-04-05 NOTE — Plan of Care (Signed)
  Problem: Education: Goal: Ability to describe self-care measures that may prevent or decrease complications (Diabetes Survival Skills Education) will improve Outcome: Progressing Goal: Individualized Educational Video(s) Outcome: Progressing   Problem: Coping: Goal: Ability to adjust to condition or change in health will improve Outcome: Progressing   Problem: Fluid Volume: Goal: Ability to maintain a balanced intake and output will improve Outcome: Progressing   Problem: Health Behavior/Discharge Planning: Goal: Ability to identify and utilize available resources and services will improve Outcome: Progressing Goal: Ability to manage health-related needs will improve Outcome: Progressing   Problem: Metabolic: Goal: Ability to maintain appropriate glucose levels will improve Outcome: Progressing   Problem: Nutritional: Goal: Maintenance of adequate nutrition will improve Outcome: Progressing Goal: Progress toward achieving an optimal weight will improve Outcome: Progressing   Problem: Clinical Measurements: Goal: Ability to maintain clinical measurements within normal limits will improve Outcome: Progressing Goal: Will remain free from infection Outcome: Progressing Goal: Diagnostic test results will improve Outcome: Progressing Goal: Respiratory complications will improve Outcome: Progressing Goal: Cardiovascular complication will be avoided Outcome: Progressing

## 2024-04-05 NOTE — Interval H&P Note (Signed)
 History and Physical Interval Note: Consult note from 04/04/24  was reviewed and there was no interval change after seeing and examining the patient.  Written consent was obtained from the patient after discussion of risks, benefits, and alternatives. Patient has consented to proceed with Esophagogastroduodenoscopy with possible intervention. Daughter present at bedside during evaluation today.  04/05/2024 12:13 PM  Elaine Cox  has presented today for surgery, with the diagnosis of dysphagia, esophageal stricture.  The various methods of treatment have been discussed with the patient and family. After consideration of risks, benefits and other options for treatment, the patient has consented to  Procedures: ESOPHAGOSCOPY, WITH ESOPHAGEAL DILATION (N/A) as a surgical intervention.  The patient's history has been reviewed, patient examined, no change in status, stable for surgery.  I have reviewed the patient's chart and labs.  Questions were answered to the patient's satisfaction.     Elspeth Ozell Jungling

## 2024-04-05 NOTE — Care Plan (Signed)
 Brief GI postop note  See procedure report for further details EGD performed today Patient with oxygen desaturation secondary to frequent coughing after sedation prior to starting endoscopic exam Patient was able to recover from this and proceeded with procedure  EGD demonstrated 2 findings in the esophagus.  1 was a widely patent Schatzki's ring.  The other was a narrowing at approximately 15 cm from the incisors.  Due to the patient's respiratory instability as well as frequent coughing and proximal location of the stenosis, it was felt safer to allow the patient to recover from her respiratory illness over the next couple weeks with plan to follow-up with her primary GI for repeat EGD and dilation with additional possible intubation for airway protection. Additionally, scope therapeutically dilated the upper esophageal stenosis so no further dilation was performed for this reason as well.  Continue twice a day PPI Provided Tessalon  Perles and viscous lidocaine  for cough and throat pain  Would stick to clear or full liquid diet until further dilation  GI to sign off. Available as needed. Please do not hesitate to call regarding questions or concerns.  Our office will reach out to the patient after discharge to help get her scheduled  This was communicated with the patient and her daughter.  Elspeth EMERSON Jungling, DO [facility]

## 2024-04-05 NOTE — Progress Notes (Signed)
 Hypoglycemic Event  CBG: 53   Treatment: D50 50 mL (25 gm) 5 oz oj Symptoms: Shaky  Follow-up CBG: Time:0729   CBG Result:149  Possible Reasons for Event: Other: Patient states she has hypoglycemic episodes nightly and gets up to drink juice and crackers  Comments/MD notified:Donati-Garmon NP    Elaine Cox

## 2024-04-05 NOTE — Progress Notes (Addendum)
 Patients glucose from lab draw was 56. Writer rechecked patients BS. Donati-Garman NP paged. See new orders at this time. Will enforce to AM Nurse Rosina

## 2024-04-05 NOTE — Anesthesia Preprocedure Evaluation (Addendum)
 "                                  Anesthesia Evaluation  Patient identified by MRN, date of birth, ID band Patient awake    Reviewed: Allergy & Precautions, H&P , NPO status , Patient's Chart, lab work & pertinent test results  Airway Mallampati: I  TM Distance: >3 FB Neck ROM: full    Dental  (+) Edentulous Lower, Edentulous Upper   Pulmonary neg pulmonary ROS   Pulmonary exam normal        Cardiovascular hypertension, + CAD, + Past MI (2012) and + Cardiac Stents (2019)  Normal cardiovascular exam  LHC 2024: 1.  Two-vessel coronary artery disease with 80% stenosis proximal left circumflex, 75% stenosis distal RCA 2.  Normal left ventricular function 3.  Successful PCI with DES small left circumflex with balloon angioplasty ostium OM1    Neuro/Psych CVA (2006, 2012)  negative psych ROS   GI/Hepatic Neg liver ROS,GERD  ,,Intractable nausea and vomiting Known distal esophageal stricture Longstanding GERD    Endo/Other  diabetes    Renal/GU negative Renal ROS  negative genitourinary   Musculoskeletal  (+) Arthritis , Rheumatoid disorders,    Abdominal  (+) + obese  Peds  Hematology negative hematology ROS (+)   Anesthesia Other Findings Dysphagia  Per Cardiology 12/25: Chest pain presents as atypical, with epigastric tenderness on palpation and s/s c/w GERD. EKG nonischemic. On chronic steroids and DAPT. S/P DES to LCX 03/25/23. 2D ECHO 04/2023 stable when compared to 2022 ECHO. Normal systolic function. Mild valvular insufficiency without valvular stenosis. Pt denies dyspnea. Euvolemic on exam. She had a 72-hour Holter monitor 04/02/2023 - 04/05/2023 revealed predominant sinus rhythm with mean heart rate 68 bpm, sinus heart rate range 50 to 113 bpm. 27% sinus bradycardia. Frequent premature atrial contractions (2%). Rare premature ventricular contraction. Occasional brief runs of SVT longest lasting 12 seconds. One 7 beat run of wide-complex  tachycardia, likely SVT with aberrancy. One episode of A-V dissociation at 62 bpm of uncertain clinical significance. Dizziness and dyspnea previously improved with stopping Imdur . EKG today reveals sinus rhythm, HR 85 bpm. Pt is without palpitations.    - Continue metoprolol  XL 50 mg qd. Consider dose increase at follow up visit. HR 80-90s at most recent visits. Holter with average HR 68 and 27% bradycardia.    - Continue CAD medical management with aspirin , metoprolol  XL, rosuvastatin . Stop clopidogrel  after completing almost 12 months of DAPT s/p coronary stent, with current reflux s/s and chronic steroid use. Discussed continuing clopidogrel  and stopping aspirin  with patient h/o coronary stents and CVA. Pt prefers to continue aspirin  only. Advised f/u for any exertional chest pain or dyspnea.    - Continue current antihypertensive regimen. BP at goal. No changes today.    - Continue following with PCP for T2DM management.   - H/O CVA - on antiplatelet and statin therapy.    - Follow up with PCP this week for GERD management.    Return in about 4 months (around 07/14/2024) for Dr. Ammon   Past Medical History: 06/2016: Anginal pain     Comment:  being followed by dr. bosie No date: Anxiety No date: Arthritis     Comment:  Osteoarthritis No date: Arthritis     Comment:  Rheumatoid 2000: Breast cancer (HCC)     Comment:  Right Breast - Chemotherapy No date: Collagen vascular disease  Comment:  Rheumatoid Arthritis. No date: Coronary artery disease No date: Diabetes mellitus without complication [facility])     Comment:  Patient takes Metformin  03/2017: Dyspnea No date: GERD (gastroesophageal reflux disease) No date: History of abdominal hysterectomy No date: History of eyelid surgery No date: History of kidney stones No date: Hyperlipidemia No date: Hypertension No date: Lumbago No date: Murmur 07/2010: Myocardial infarction (HCC) 03/2017: Orthopnea 2000: Personal history of  chemotherapy     Comment:  right breast ca No date: Sinus disorder 2006, 2012: Stroke (HCC) No date: Wears dentures     Comment:  full upper and lower  Past Surgical History: No date: ABDOMINAL HYSTERECTOMY No date: BREAST SURGERY; Right     Comment:  mastectomy  2012: CARDIAC CATHETERIZATION     Comment:  stent placed 06/24/2017: CATARACT EXTRACTION W/PHACO; Right     Comment:  Procedure: CATARACT EXTRACTION PHACO AND INTRAOCULAR               LENS PLACEMENT (IOC) COMPLICATED RIGHT DIABETIC;                Surgeon: Mittie Gaskin, MD;  Location: [facility]               SURGERY [facility];  Service: Ophthalmology;  Laterality:               Right;  Diabetes - oral med No date: COLONOSCOPY 12/08/2017: COLONOSCOPY WITH PROPOFOL ; N/A     Comment:  Procedure: COLONOSCOPY WITH PROPOFOL ;  Surgeon: [place],               Ladell POUR, MD;  Location: [facility] ENDOSCOPY;  Service:               Gastroenterology;  Laterality: N/A; No date: CORONARY ANGIOPLASTY 02/24/2018: CORONARY STENT INTERVENTION; N/A     Comment:  Procedure: CORONARY STENT INTERVENTION;  Surgeon:               Ammon Blunt, MD;  Location: [facility] INVASIVE CV               LAB;  Service: Cardiovascular;  Laterality: N/A; 03/25/2023: CORONARY STENT INTERVENTION; N/A     Comment:  Procedure: CORONARY STENT INTERVENTION;  Surgeon:               Ammon Blunt, MD;  Location: [facility] INVASIVE CV               LAB;  Service: Cardiovascular;  Laterality: N/A; 09/08/2016: CYSTOSCOPY; N/A     Comment:  Procedure: CYSTOSCOPY;  Surgeon: Ward, Mitzie BROCKS, MD;                Location: [facility] ORS;  Service: Gynecology;  Laterality:               N/A; No date: EYE SURGERY; Left     Comment:  Cataract Extraction with IOL 03/14/2020: KNEE ARTHROPLASTY; Right     Comment:  Procedure: COMPUTER ASSISTED TOTAL KNEE ARTHROPLASTY;                Surgeon: Mardee Lynwood SQUIBB, MD;  Location: [facility] ORS;                Service: Orthopedics;  Laterality:  Right;  FOLLOWING 1ST               CASE No date: KNEE ARTHROSCOPY W/ MENISCAL REPAIR; Right 09/08/2016: LAPAROSCOPIC BILATERAL SALPINGO OOPHERECTOMY; Bilateral     Comment:  Procedure: LAPAROSCOPIC BILATERAL SALPINGO OOPHORECTOMY;  Surgeon: Ward, Mitzie BROCKS, MD;  Location: [facility] ORS;                Service: Gynecology;  Laterality: Bilateral; 02/24/2018: LEFT HEART CATH AND CORONARY ANGIOGRAPHY; Left     Comment:  Procedure: LEFT HEART CATH AND CORONARY ANGIOGRAPHY;                Surgeon: Ammon Blunt, MD;  Location: [facility]               INVASIVE CV LAB;  Service: Cardiovascular;  Laterality:               Left; 03/25/2023: LEFT HEART CATH AND CORONARY ANGIOGRAPHY; Left     Comment:  Procedure: LEFT HEART CATH AND CORONARY ANGIOGRAPHY;                Surgeon: Ammon Blunt, MD;  Location: [facility]               INVASIVE CV LAB;  Service: Cardiovascular;  Laterality:               Left; 2000: MASTECTOMY; Right     Comment:  BREAST CA  BMI    Body Mass Index: 31.46 kg/m      Reproductive/Obstetrics negative OB ROS                              Anesthesia Physical Anesthesia Plan  ASA: 3  Anesthesia Plan: General   Post-op Pain Management: Minimal or no pain anticipated   Induction: Intravenous  PONV Risk Score and Plan: Propofol  infusion and TIVA  Airway Management Planned: Natural Airway  Additional Equipment:   Intra-op Plan:   Post-operative Plan:   Informed Consent: I have reviewed the patients History and Physical, chart, labs and discussed the procedure including the risks, benefits and alternatives for the proposed anesthesia with the patient or authorized representative who has indicated his/her understanding and acceptance.     Dental Advisory Given  Plan Discussed with: CRNA and Surgeon  Anesthesia Plan Comments:          Anesthesia Quick Evaluation  "

## 2024-04-05 NOTE — Transfer of Care (Signed)
 Immediate Anesthesia Transfer of Care Note  Patient: Elaine Cox  Procedure(s) Performed: ESOPHAGOSCOPY, WITH ESOPHAGEAL DILATION  Patient Location: PACU  Anesthesia Type:General  Level of Consciousness: drowsy  Airway & Oxygen Therapy: Patient Spontanous Breathing and Patient connected to face mask oxygen  Post-op Assessment: Report given to RN and Post -op Vital signs reviewed and stable  Post vital signs: Reviewed and stable  Last Vitals:  Vitals Value Taken Time  BP    Temp    Pulse    Resp    SpO2      Last Pain:  Vitals:   04/05/24 1156  TempSrc: Tympanic  PainSc: 6          Complications: There were no known notable events for this encounter.

## 2024-04-05 NOTE — Care Management Obs Status (Signed)
 MEDICARE OBSERVATION STATUS NOTIFICATION   Patient Details  Name: Elaine Cox MRN: [id number] Date of Birth: 1945-05-12   Medicare Observation Status Notification Given:  Yes    Rojelio [name] 04/05/2024, 3:53 PM

## 2024-04-05 NOTE — Progress Notes (Signed)
 Initial Nutrition Assessment  DOCUMENTATION CODES:   Obesity unspecified  INTERVENTION:   Ensure Plus High Protein po TID, each supplement provides 350 kcal and 20 grams of protein  Magic cup TID with meals, each supplement provides 290 kcal and 9 grams of protein  MVI po daily   Thiamine 100mg  po daily x 7 days   Pt at high refeed risk; recommend monitor potassium, magnesium  and phosphorus labs daily until stable  Daily weights   NUTRITION DIAGNOSIS:   Inadequate oral intake related to dysphagia as evidenced by per patient/family report.  GOAL:   Patient will meet greater than or equal to 90% of their needs  MONITOR:   PO intake, Supplement acceptance, Diet advancement, Labs, Weight trends, I & O's, Skin  REASON FOR ASSESSMENT:   Malnutrition Screening Tool    ASSESSMENT:   78 y/o female with h/o anxiety, GERD, DM, HTN, CAD, HLD, RA, stroke, MI, kidney stones and esophageal stricture who is admitted with intractable nausea/vomiting and the inability to keep food down.  RD working remotely.  Per chart review, pt reports poor oral intake for 1 week pta r/t esophageal stricture with regurgitation and the inability to get down any food. Pt s/p EGD today and was found to have Schatzki's ring and esophageal stenosis which was dilated. Recommend continue Ensure supplements until pt is able to tolerate a regular diet. Pt is at high refeed risk. Per chart, pt appears weight stable at baseline. RD will obtain exam at follow up.   Medications reviewed and include: insulin , protonix , prednisone , NaCl w/ 5% dextrose  @40ml /hr  Labs reviewed: K 3.4(L), BUN 6(L) Cbgs- 70, 135, 59 x 24 hrs  AIC 8.3(H)- 12/29  NUTRITION - FOCUSED PHYSICAL EXAM: Unable to perform at this time   Diet Order:   Diet Order             Diet full liquid Room service appropriate? Yes; Fluid consistency: Thin  Diet effective now                  EDUCATION NEEDS:   No education needs have  been identified at this time  Skin:  Skin Assessment: Reviewed RN Assessment  Last BM:  12/30  Height:   Ht Readings from Last 1 Encounters:  04/04/24 5' 2 (1.575 m)    Weight:   Wt Readings from Last 1 Encounters:  04/04/24 78 kg    Ideal Body Weight:  50 kg  BMI:  Body mass index is 31.46 kg/m.  Estimated Nutritional Needs:   Kcal:  1600-1800kcal/day  Protein:  80-90g/day  Fluid:  1.6-1.8L/day  Augustin Shams MS, RD, LDN If unable to be reached, please send secure chat to RD inpatient available from 8:00a-4:00p daily

## 2024-04-05 NOTE — Progress Notes (Signed)
 " PROGRESS NOTE    Elaine Cox  FMW:[id number] DOB: 27-Oct-1945 DOA: 04/04/2024 PCP: Sadie Manna, MD    Brief Narrative:  78 y.o. female with medical history significant of history of CAD and prior non-STEMI, diabetes mellitus on insulin , hypertension, severe GERD, dyslipidemia, rheumatoid arthritis.  Patient reports years of GERD but over the past 2 months symptoms have worsened.  About 1 month ago she had her most severe episode.  She underwent an outpatient barium study that did reveal a distal esophageal stricture.  She has been unable to eat or drink for at least 1 week and at times eating has precipitated emesis type situation.  She states it feels like anything she drinks is bubbling in her throat and comes right back up.  She has only been able to eat a few tablespoons of applesauce per attempt.  Over the past several days she has been coughing.  It is unclear if this is related to reflux related irritation or an infectious process.  She has been in contact with sick individuals in her family are experiencing URI symptoms.  Chest x-ray unremarkable.  GI/Dr. Onita has been consulted by the EDP and plans are to proceed with EGD tomorrow.  Hospitalist service has been asked to evaluate the patient for admission   Patient underwent EGD with GI on 12/30.  Case discussed with GI.  Schatzki's ring noted that was widely patent unlikely the source.  Patient had a very proximal esophageal stenosis that was dilated with scope alone.  GI recommends no further intervention at this time and follow-up with outpatient gastroenterology in 3 to 4 weeks which they will arrange.  Patient still having some significant cough, occasional oxygen desaturation and difficulty tolerating p.o.  Assessment & Plan:   Principal Problem:   Intractable nausea and vomiting  Intractable nausea and vomiting Known distal esophageal stricture Longstanding GERD Presented with inability to tolerate liquids or  applesauce Underwent EGD with scope dilatation on 12/30 Fairly narrow stricture noted in proximal esophagus Plan: Advance to full liquid diet as tolerated Continue PPI Monitor overnight If stable and tolerating p.o. medications consider discharge 12/31 Patient follow-up with GI Dr. Aundria  Acute hypokalemia Improved Recheck potassium in a.m. after supplementation   Cough Chest x-ray unremarkable There are sick contacts in the family so unclear if coughing secondary to aspiration in context of severe reflux/esophageal stricture or if viral illness Respiratory viral panel negative Plan: Diet as tolerated Tussidex for cough suppression Antireflux therapy   Diabetes mellitus Poor oral intake and had symptomatic relative hypoglycemia with a CBG of 75 at home Hemoglobin A1c was 9.0 in June Repeat hemoglobin A1c 8.3 Plan: Follow CBGs   Rheumatoid arthritis Cautiously restart home prednisone    Hypertension CAD Dyslipidemia Reports difficulty swallowing oral medications at home Post EGD will attempt oral medications Can hold statin and aspirin  for now Start beta-blocker from tomorrow morning 12/31   Anxiety disorder Continue daily Xanax     DVT prophylaxis: SCD Code Status: [place] Family Communication:None today.  GI spoke with patients daughter Disposition Plan: Status is: Observation The patient will require care spanning > 2 midnights and should be moved to inpatient because: EGD status post dilation.  Needs continued hospitalization for close monitoring and insurance of tolerating some p.o. intake as well as p.o. medications prior to discharge.  Anticipate discharge 12/31.   Level of care: Med-Surg  Consultants:  GI  Procedures:  EGD  Antimicrobials: None   Subjective: Seen and examined.  Continues to cough.  No pain complaints.  Objective: Vitals:   04/05/24 1256 04/05/24 1305 04/05/24 1316 04/05/24 1350  BP: (!) 140/75 (!) 151/68 (!) 146/68 134/62   Pulse: 73 76 72 69  Resp: 13 (!) 21 16 18   Temp: 97.8 F (36.6 C)   98.3 F (36.8 C)  TempSrc: Tympanic   Oral  SpO2: 100% 100% 100% 99%  Weight:      Height:        Intake/Output Summary (Last 24 hours) at 04/05/2024 1425 Last data filed at 04/05/2024 1253 Gross per 24 hour  Intake 1385.48 ml  Output --  Net 1385.48 ml   Filed Weights   04/04/24 1141  Weight: 78 kg    Examination:  General exam: NAD.  Appears fatigued Respiratory system: Clear.  Normal breathing.  Room air Cardiovascular system: S1-S2, RRR, no murmurs, no pedal edema Gastrointestinal system: Soft NT/ND, normal bowel sounds Central nervous system: Alert and oriented. No focal neurological deficits. Extremities: Symmetric 5 x 5 power. Skin: No rashes, lesions or ulcers Psychiatry: Judgement and insight appear normal. Mood & affect appropriate.     Data Reviewed: I have personally reviewed following labs and imaging studies  CBC: Recent Labs  Lab 04/04/24 1153  WBC 7.1  HGB 14.8  HCT 41.1  MCV 83.7  PLT 258   Basic Metabolic Panel: Recent Labs  Lab 04/04/24 1153 04/05/24 0459  NA 139 140  K 3.2* 3.4*  CL 101 106  CO2 27 25  GLUCOSE 101* 56*  BUN 5* 6*  CREATININE 0.74 0.53  CALCIUM  9.1 8.1*   GFR: Estimated Creatinine Clearance: 57 mL/min (by C-G formula based on SCr of 0.53 mg/dL). Liver Function Tests: Recent Labs  Lab 04/04/24 1153  AST 22  ALT 9  ALKPHOS 51  BILITOT 0.5  PROT 6.9  ALBUMIN 3.6   Recent Labs  Lab 04/04/24 1153  LIPASE 20   No results for input(s): AMMONIA in the last 168 hours. Coagulation Profile: No results for input(s): INR, PROTIME in the last 168 hours. Cardiac Enzymes: No results for input(s): CKTOTAL, CKMB, CKMBINDEX, TROPONINI in the last 168 hours. BNP (last 3 results) No results for input(s): PROBNP in the last 8760 hours. HbA1C: Recent Labs    04/04/24 1153  HGBA1C 8.3*   CBG: Recent Labs  Lab 04/05/24 0729  04/05/24 0832 04/05/24 1100 04/05/24 1127 04/05/24 1352  GLUCAP 149* 111* 59* 135* 70   Lipid Profile: No results for input(s): CHOL, HDL, LDLCALC, TRIG, CHOLHDL, LDLDIRECT in the last 72 hours. Thyroid  Function Tests: No results for input(s): TSH, T4TOTAL, FREET4, T3FREE, THYROIDAB in the last 72 hours. Anemia Panel: No results for input(s): VITAMINB12, FOLATE, FERRITIN, TIBC, IRON, RETICCTPCT in the last 72 hours. Sepsis Labs: No results for input(s): PROCALCITON, LATICACIDVEN in the last 168 hours.  No results found for this or any previous visit (from the past 240 hours).       Radiology Studies: DG Chest 2 View Result Date: 04/04/2024 EXAM: 2 VIEW(S) XRAY OF THE CHEST 04/04/2024 12:05:00 PM COMPARISON: 11/23/2023 CLINICAL HISTORY: cough FINDINGS: LUNGS AND PLEURA: No focal pulmonary opacity. No pleural effusion. No pneumothorax. HEART AND MEDIASTINUM: Atherosclerotic plaque. BONES AND SOFT TISSUES: No acute osseous abnormality. IMPRESSION: 1. No acute cardiopulmonary process. Electronically signed by: Selinda Blue MD 04/04/2024 01:43 PM EST RP Workstation: [id number]        Scheduled Meds:  ALPRAZolam   0.25 mg Oral QPM   benzonatate   100 mg Oral TID   dextrose   25  g Intravenous STAT   insulin  aspart  0-5 Units Subcutaneous QHS   insulin  aspart  0-9 Units Subcutaneous TID WC   metoprolol  tartrate  2.5 mg Intravenous Q8H   pantoprazole  (PROTONIX ) IV  40 mg Intravenous Q12H   sodium chloride  flush  3 mL Intravenous Q12H   Continuous Infusions:  dextrose  5 % and 0.45 % NaCl 40 mL/hr at 04/05/24 1123     LOS: 0 days    Calvin KATHEE Robson, MD [facility]   If 7PM-7AM, please contact night-coverage  04/05/2024, 2:25 PM   "

## 2024-04-05 NOTE — Op Note (Signed)
 [facility] Patient Name: Elaine Cox Procedure Date: 04/05/2024 11:05 AM MRN: [id number] Account #: [id number] Date of Birth: 1945-07-25 Admit Type: Emergency Department Age: 78 Room: endo 3 Gender: Female Note Status: Finalized Instrument Name: Endoscope [id number] Procedure:             Upper GI endoscopy Indications:           Dysphagia Providers:             Elspeth Ozell Jungling DO, DO Referring MD:          No Local Md, MD (Referring MD) Medicines:             Monitored Anesthesia Care Complications:         No immediate complications. Estimated blood loss:                         Minimal. Procedure:             Pre-Anesthesia Assessment:                        - Prior to the procedure, a History and Physical was                         performed, and patient medications and allergies were                         reviewed. The patient is competent. The risks and                         benefits of the procedure and the sedation options and                         risks were discussed with the patient. All questions                         were answered and informed consent was obtained.                         Patient identification and proposed procedure were                         verified by the physician, the nurse, the anesthetist                         and the technician in the endoscopy suite. Mental                         Status Examination: alert and oriented. Airway                         Examination: normal oropharyngeal airway and neck                         mobility. Respiratory Examination: diminished                         bilaterally. CV Examination: regular rate and rhythm.  Prophylactic Antibiotics: The patient does not require                         prophylactic antibiotics. Prior Anticoagulants: The                         patient has taken no anticoagulant or antiplatelet                          agents. ASA Grade Assessment: III - A patient with                         severe systemic disease. After reviewing the risks and                         benefits, the patient was deemed in satisfactory                         condition to undergo the procedure. The anesthesia                         plan was to use monitored anesthesia care (MAC).                         Immediately prior to administration of medications,                         the patient was re-assessed for adequacy to receive                         sedatives. The heart rate, respiratory rate, oxygen                         saturations, blood pressure, adequacy of pulmonary                         ventilation, and response to care were monitored                         throughout the procedure. The physical status of the                         patient was re-assessed after the procedure.                        After obtaining informed consent, the endoscope was                         passed under direct vision. Throughout the procedure,                         the patient's blood pressure, pulse, and oxygen                         saturations were monitored continuously. The Endoscope                         was introduced through the mouth, and advanced to the  second part of duodenum. The upper GI endoscopy was                         somewhat difficult due to the patient's oxygen                         desaturation. Successful completion of the procedure                         was aided by performing chin lift and administering                         oxygen. The patient tolerated the procedure well. Findings:      The duodenal bulb, first portion of the duodenum and second portion of       the duodenum were normal. Estimated blood loss: none.      The entire examined stomach was normal. Estimated blood loss: none.      One benign-appearing, intrinsic moderate (circumferential  scarring or       stenosis; an endoscope may pass) stenosis was found 15 cm from the       incisors. This stenosis measured 1 cm (inner diameter) x 2 cm (in       length). The stenosis was traversed. Endoscope dilated the area with       superficial disruption. No further dilation was performed. Estimated       blood loss was minimal.      A widely patent Schatzki ring was found at the gastroesophageal       junction. Estimated blood loss: none.      The exam of the esophagus was otherwise normal. Impression:            - Normal duodenal bulb, first portion of the duodenum                         and second portion of the duodenum.                        - Normal stomach.                        - Benign-appearing esophageal stenosis.                        - Widely patent Schatzki ring.                        - No specimens collected. Recommendation:        - Patient has a contact number available for                         emergencies. The signs and symptoms of potential                         delayed complications were discussed with the patient.                         Return to normal activities tomorrow. Written                         discharge  instructions were provided to the patient.                        - Discharge patient to home.                        - Full liquid diet until next dilation.                        - Continue present medications.                        - Continue twice a day ppi                        - Repeat upper endoscopy 3-4 weeks for retreatment.                        - Given how proximal the stenosis is, consider                         intubation                        Frequent coughing and oxygen desaturation also                         prohibitive of further dilation during ths procedure,                         although superficial disruption occurred with                         endoscope alone                        Would allow for  respiratory illness to resolve prior                         to next EGD                        Will connect with her primary GI at [facility]                        - The findings and recommendations were discussed with                         the patient's family.                        - The findings and recommendations were discussed with                         the patient. Procedure Code(s):     --- Professional ---                        [id number], Esophagogastroduodenoscopy, flexible,                         transoral; diagnostic, including collection of  specimen(s) by brushing or washing, when performed                         (separate procedure) Diagnosis Code(s):     --- Professional ---                        K22.2, Esophageal obstruction                        R13.10, Dysphagia, unspecified CPT copyright 2022 [employer]. All rights reserved. The codes documented in this report are preliminary and upon coder review may  be revised to meet current compliance requirements. Attending Participation:      I personally performed the entire procedure. Elspeth Jungling, DO Elspeth Ozell Jungling DO, DO 04/05/2024 1:01:48 PM This report has been signed electronically. Number of Addenda: 0 Note Initiated On: 04/05/2024 11:05 AM Estimated Blood Loss:  Estimated blood loss was minimal.      [facility]

## 2024-04-06 ENCOUNTER — Other Ambulatory Visit: Payer: Self-pay

## 2024-04-06 DIAGNOSIS — R112 Nausea with vomiting, unspecified: Secondary | ICD-10-CM | POA: Diagnosis not present

## 2024-04-06 LAB — PHOSPHORUS: Phosphorus: 2.3 mg/dL — ABNORMAL LOW (ref 2.5–4.6)

## 2024-04-06 LAB — GLUCOSE, CAPILLARY
Glucose-Capillary: 75 mg/dL (ref 70–99)
Glucose-Capillary: 194 mg/dL — ABNORMAL HIGH (ref 70–99)

## 2024-04-06 LAB — MAGNESIUM: Magnesium: 1.7 mg/dL (ref 1.7–2.4)

## 2024-04-06 LAB — BASIC METABOLIC PANEL WITH GFR
Anion gap: 8 (ref 5–15)
BUN: 8 mg/dL (ref 8–23)
CO2: 25 mmol/L (ref 22–32)
Calcium: 7.5 mg/dL — ABNORMAL LOW (ref 8.9–10.3)
Chloride: 102 mmol/L (ref 98–111)
Creatinine, Ser: 0.53 mg/dL (ref 0.44–1.00)
GFR, Estimated: >60 mL/min
Glucose, Bld: 298 mg/dL — ABNORMAL HIGH (ref 70–99)
Potassium: 3.8 mmol/L (ref 3.5–5.1)
Sodium: 135 mmol/L (ref 135–145)

## 2024-04-06 MED ORDER — HYDROCOD POLI-CHLORPHE POLI ER 10-8 MG/5ML PO SUER
5.0000 mL | Freq: Two times a day (BID) | ORAL | 0 refills | Status: AC | PRN
  Filled 2024-04-06: qty 20, 2d supply, fill #0

## 2024-04-06 MED ORDER — ONDANSETRON HCL 4 MG PO TABS: 4.0000 mg | ORAL_TABLET | Freq: Three times a day (TID) | ORAL | 0 refills | Status: AC | PRN

## 2024-04-06 NOTE — Discharge Summary (Signed)
 " Physician Discharge Summary   Patient: Elaine Cox MRN: [id number] DOB: 07/06/1945  Admit date:     04/04/2024  Discharge date: 04/06/2024  Discharge Physician: Drue ONEIDA Potter   PCP: Sadie Manna, MD   Recommendations at discharge:  Follow-up with GI  Discharge Diagnoses:  Intractable nausea and vomiting-resolved Known distal esophageal stricture Longstanding GERD Acute hypokalemia-improved Cough Diabetes mellitus Rheumatoid arthritis Hypertension CAD Dyslipidemia Anxiety disorder  Hospital Course:  From HPI 78 y.o. female with medical history significant of history of CAD and prior non-STEMI, diabetes mellitus on insulin , hypertension, severe GERD, dyslipidemia, rheumatoid arthritis.  Patient reports years of GERD but over the past 2 months symptoms have worsened.  About 1 month ago she had her most severe episode.  She underwent an outpatient barium study that did reveal a distal esophageal stricture.  She has been unable to eat or drink for at least 1 week and at times eating has precipitated emesis type situation.  She states it feels like anything she drinks is bubbling in her throat and comes right back up.  She has only been able to eat a few tablespoons of applesauce per attempt.  Over the past several days she has been coughing.  It is unclear if this is related to reflux related irritation or an infectious process.  She has been in contact with sick individuals in her family are experiencing URI symptoms.  Chest x-ray unremarkable.  GI/Dr. Onita has been consulted by the EDP and plans are to proceed with EGD tomorrow.  Hospitalist service has been asked to evaluate the patient for admission    Patient underwent EGD with GI on 12/30.  Case discussed with GI.  Schatzki's ring noted that was widely patent unlikely the source.  Patient had a very proximal esophageal stenosis that was dilated with scope alone.  GI recommends no further intervention at this time and  follow-up with outpatient gastroenterology in 3 to 4 weeks which they will arrange.   Patient still having some significant cough, occasional oxygen desaturation and difficulty tolerating p.o.    Other hospital course as noted below   Assessment & Plan:   Intractable nausea and vomiting Known distal esophageal stricture Longstanding GERD Presented with inability to tolerate liquids or applesauce Underwent EGD with scope dilatation on 12/30 Fairly narrow stricture noted in proximal esophagus Plan: Advance to full liquid diet as tolerated Continue PPI Monitor overnight Patient currently tolerating feed and therefore cleared for discharge today to follow-up as an outpatient Patient follow-up with GI Dr. Aundria   Acute hypokalemia Improved   Cough Chest x-ray unremarkable There are sick contacts in the family so unclear if coughing secondary to aspiration in context of severe reflux/esophageal stricture or if viral illness Respiratory viral panel negative Plan: Diet as tolerated Tussidex for cough suppression Antireflux therapy   Diabetes mellitus Poor oral intake and had symptomatic relative hypoglycemia with a CBG of 75 at home Hemoglobin A1c was 9.0 in June Repeat hemoglobin A1c 8.3 Plan: Follow CBGs   Rheumatoid arthritis Cautiously restart home prednisone    Hypertension CAD Dyslipidemia Reports difficulty swallowing oral medications at home Post EGD will attempt oral medications Can hold statin and aspirin  for now   Anxiety disorder Continue daily Xanax    Consultants: GI Procedures performed: As mentioned above Disposition: Home Diet recommendation:  Cardiac diet DISCHARGE MEDICATION: Allergies as of 04/06/2024       Reactions   Doxazosin Other (See Comments)   headache   Fish Allergy Anaphylaxis, Itching,  Nausea Only, Swelling   She states her tongue turns black.   Dicloxacillin Nausea And Vomiting   Has patient had a PCN reaction causing  immediate rash, facial/tongue/throat swelling, SOB or lightheadedness with hypotension: No Has patient had a PCN reaction causing severe rash involving mucus membranes or skin necrosis: No Has patient had a PCN reaction that required hospitalization: Unknown Has patient had a PCN reaction occurring within the last 10 years: Unknown If all of the above answers are NO, then may proceed with Cephalosporin use.   Iodine Hives   Per patient, she had a previous reaction/hives from an iodine injection   Sulfa Antibiotics Swelling   Loss of appetite   Constipation bloating Other reaction(s): Headache   Cardura [doxazosin Mesylate]    Headache    Etodolac Itching, Swelling   Sulfamethoxazole-trimethoprim Nausea Only   Zofran  [ondansetron ]    Azithromycin Itching   Ciprofloxacin  Nausea Only   Pt states she has never had a rash   Clopidogrel  Nausea And Vomiting   Colchicine Nausea Only, Other (See Comments)   Dizzness   Glucosamine Nausea And Vomiting, Other (See Comments)   Leflunomide Diarrhea, Itching   Meloxicam Itching   Methotrexate Nausea Only   Zoloft [sertraline] Nausea And Vomiting        Medication List     STOP taking these medications    glimepiride  4 MG tablet Commonly known as: AMARYL        TAKE these medications    Accu-Chek Guide test strip Generic drug: glucose blood   acetaminophen  325 MG tablet Commonly known as: TYLENOL  Take 2 tablets (650 mg total) by mouth every 6 (six) hours as needed for mild pain (or Fever >/= 101). What changed: how much to take   Align 4 MG Caps Take 8 mg by mouth daily.   ALPRAZolam  0.25 MG tablet Commonly known as: XANAX  Take 0.25 mg by mouth daily.   amLODipine  10 MG tablet Commonly known as: NORVASC  Take 10 mg by mouth daily.   aspirin  EC 81 MG tablet Take 1 tablet (81 mg total) by mouth daily. Swallow whole.   B-D SINGLE USE SWABS REGULAR Pads   Biotin 5000 MCG Tabs Take 10,000 mcg by mouth daily.    chlorpheniramine-HYDROcodone  10-8 MG/5ML Commonly known as: TUSSIONEX Take 5 mLs by mouth every 12 (twelve) hours as needed for cough.   D 1000 25 MCG (1000 UT) capsule Generic drug: Cholecalciferol Take 1,000 Units by mouth daily.   ferrous sulfate  325 (65 FE) MG tablet Take 1 tablet (325 mg total) by mouth daily with breakfast.   Lantus  SoloStar 100 UNIT/ML Solostar Pen Generic drug: insulin  glargine 15 Units daily.   metoprolol  succinate 50 MG 24 hr tablet Commonly known as: TOPROL -XL Take 50 mg by mouth daily.   ondansetron  4 MG tablet Commonly known as: ZOFRAN  Take 1 tablet (4 mg total) by mouth every 8 (eight) hours as needed for up to 7 days.   pantoprazole  20 MG tablet Commonly known as: PROTONIX  Take 1 tablet (20 mg total) by mouth 2 (two) times daily before meals Take 30 minutes before meals and other medications.   predniSONE  5 MG tablet Commonly known as: DELTASONE  Take 7.25 mg by mouth daily with breakfast.   rosuvastatin  5 MG tablet Commonly known as: CRESTOR  Take 5 mg by mouth daily.   True Metrix Level 1 Low Soln        Discharge Exam: Filed Weights   04/04/24 1141 04/06/24 0353  Weight: 78  kg 81.5 kg   General exam: NAD.   Respiratory system: Clear.  Normal breathing.  Room air Cardiovascular system: S1-S2, RRR, no murmurs, no pedal edema Gastrointestinal system: Soft NT/ND, normal bowel sounds Central nervous system: Alert and oriented. No focal neurological deficits. Extremities: Symmetric 5 x 5 power. Skin: No rashes, lesions or ulcers Psychiatry: Judgement and insight appear normal. Mood & affect appropriate.   Condition at discharge: good  The results of significant diagnostics from this hospitalization (including imaging, microbiology, ancillary and laboratory) are listed below for reference.   Imaging Studies: DG Chest 2 View Result Date: 04/04/2024 EXAM: 2 VIEW(S) XRAY OF THE CHEST 04/04/2024 12:05:00 PM COMPARISON: 11/23/2023  CLINICAL HISTORY: cough FINDINGS: LUNGS AND PLEURA: No focal pulmonary opacity. No pleural effusion. No pneumothorax. HEART AND MEDIASTINUM: Atherosclerotic plaque. BONES AND SOFT TISSUES: No acute osseous abnormality. IMPRESSION: 1. No acute cardiopulmonary process. Electronically signed by: Selinda Blue MD 04/04/2024 01:43 PM EST RP Workstation: [id number]   DG ESOPHAGUS W DOUBLE CM (HD) Result Date: 03/28/2024 CLINICAL DATA:  78 year old female with longstanding history of GERD, with nausea and vomiting. Patient complains of heartburn along entire esophageal column, and into mid abdomen. Recently placed on PPI plus H2 blocker. EXAM: ESOPHAGUS/BARIUM SWALLOW/TABLET STUDY TECHNIQUE: Combined double and single contrast examination was performed using effervescent crystals, high-density barium, and thin liquid barium. This exam was performed by Carlin Griffon, PA-C, and was supervised and interpreted by Dr. Marcey Moan, MD. FLUOROSCOPY: Radiation Exposure Index (as provided by the fluoroscopic device): 64.3 mGy Kerma COMPARISON:  None Available. FINDINGS: Swallowing: Weekend swallow reflex, requiring multiple swallows to clear oral bolus. No vestibular penetration or aspiration seen. Pharynx: Unremarkable. Esophagus: Suspect mild to moderate distal esophageal stricture. Esophageal motility: Moderate esophageal dysmotility, with poor primary peristalsis, notable proximal escape, and tertiary contractions. Hiatal Hernia: None. Gastroesophageal reflux: None visualized initially, despite provocative maneuvers. Ingested 13mm barium tablet: Passed normally Other: Patient aspirated on water, while attempting to clear bolus of barium. Image 9 demonstrates suspected reflux secondary to patient coughing from suspected aspiration. IMPRESSION: 1. Suspect mild to moderate distal esophageal stricture. 2. Esophageal dysmotility. 3. Possible reflux and aspiration episode while swallowing water during the exam. 4. Electronically  Signed   By: Marcey Moan M.D.   On: 03/28/2024 15:43    Microbiology: Results for orders placed or performed in visit on 03/24/24  Microscopic Examination     Status: Abnormal   Collection Time: 03/24/24  7:49 AM   Urine  Result Value Ref Range Status   WBC, UA 0-5 0 - 5 /hpf Final   RBC, Urine 0-2 0 - 2 /hpf Final   Epithelial Cells (non renal) >10 (A) 0 - 10 /hpf Final   Crystals Present (A) N/A Final   Crystal Type Amorphous Sediment N/A Final   Mucus, UA Present (A) Not Estab. Final   Bacteria, UA Moderate (A) None seen/Few Final    Labs: CBC: Recent Labs  Lab 04/04/24 1153  WBC 7.1  HGB 14.8  HCT 41.1  MCV 83.7  PLT 258   Basic Metabolic Panel: Recent Labs  Lab 04/04/24 1153 04/05/24 0459 04/06/24 0450  NA 139 140 135  K 3.2* 3.4* 3.8  CL 101 106 102  CO2 27 25 25   GLUCOSE 101* 56* 298*  BUN 5* 6* 8  CREATININE 0.74 0.53 0.53  CALCIUM  9.1 8.1* 7.5*  MG  --   --  1.7  PHOS  --   --  2.3*   Liver Function Tests:  Recent Labs  Lab 04/04/24 1153  AST 22  ALT 9  ALKPHOS 51  BILITOT 0.5  PROT 6.9  ALBUMIN 3.6   CBG: Recent Labs  Lab 04/05/24 1352 04/05/24 1724 04/05/24 2147 04/06/24 0815 04/06/24 1122  GLUCAP 70 128* 204* 75 194*    Discharge time spent: 36 minutes.  Signed: Drue ONEIDA Potter, MD [facility] 04/06/2024 "

## 2024-04-06 NOTE — TOC CM/SW Note (Signed)
 Transition of Care [facility]) - Inpatient Brief Assessment   Patient Details  Name: Elaine Cox MRN: [id number] Date of Birth: Jul 17, 1945  Transition of [facility]) CM/SW Contact:    Corean ONEIDA Haddock, RN Phone Number: 04/06/2024, 11:44 AM   Clinical Narrative:  Transition of Care Department [facility]) has reviewed patient and no TOC needs have been identified at this time.  If new patient transition needs arise, please place a TOC consult.   Transition of Care Asessment: Insurance and Status: Insurance coverage has been reviewed Patient has primary care physician: Yes     Prior/Current Home Services: No current home services Social Drivers of Health Review: SDOH reviewed interventions complete Readmission risk has been reviewed: Yes Transition of care needs: no transition of care needs at this time

## 2024-04-06 NOTE — Discharge Instructions (Signed)

## 2024-04-06 NOTE — Inpatient Diabetes Management (Signed)
 Inpatient Diabetes Program Recommendations  AACE/ADA: New Consensus Statement on Inpatient Glycemic Control   Target Ranges:  Prepandial:   less than 140 mg/dL      Peak postprandial:   less than 180 mg/dL (1-2 hours)      Critically ill patients:  140 - 180 mg/dL    Latest Reference Range & Units 04/06/24 04:50  Glucose 70 - 99 mg/dL 701 (H)    Latest Reference Range & Units 04/05/24 06:56 04/05/24 07:29 04/05/24 08:32 04/05/24 11:00 04/05/24 11:27 04/05/24 13:52 04/05/24 17:24 04/05/24 21:47  Glucose-Capillary 70 - 99 mg/dL 53 (L) 850 (H) 888 (H) 59 (L) 135 (H) 70 128 (H) 204 (H)   Review of Glycemic Control  Diabetes history: DM2 Outpatient Diabetes medications: Amaryl  4 mg QAM, Lantus  15 units every day; Prednisone  7.25 mg daily  Current orders for Inpatient glycemic control: Novolog  0-9 units TID with meals, Novolog  0-5 units at bedtime; Prednisone  7.25 mg daily  Inpatient Diabetes Program Recommendations:    Insulin : Patient experienced hypoglycemia several times on 04/05/24 from outpatient DM medications. Lab glucose 298 mg/dl this morning. Please consider ordering Lantus  8 units Q24H (based on 81.5 kg x 0.1 units).  IV Fluids: May want to consider removing dextrose  from IV fluids if appropriate.  Thanks, Earnie Gainer, RN, MSN, CDCES Diabetes Coordinator Inpatient Diabetes Program [phone] (Team Pager from 8am to 5pm)

## 2024-04-06 NOTE — Plan of Care (Signed)
 IV removed, discharge instructions will be reviewed with patient, discharge to home daughter has been called

## 2024-04-25 ENCOUNTER — Encounter: Payer: Self-pay | Admitting: Internal Medicine

## 2024-05-04 ENCOUNTER — Ambulatory Visit

## 2024-05-04 ENCOUNTER — Encounter: Payer: Self-pay | Admitting: Internal Medicine

## 2024-05-04 ENCOUNTER — Encounter
Admission: RE | Disposition: A | Discharge status: Home / Self Care | Payer: Self-pay | Source: Home / Self Care | Attending: Internal Medicine

## 2024-05-04 ENCOUNTER — Ambulatory Visit
Admission: RE | Admit: 2024-05-04 | Discharge: 2024-05-04 | Disposition: A | Discharge status: Home / Self Care | Attending: Internal Medicine | Admitting: Internal Medicine

## 2024-05-04 DIAGNOSIS — Z1211 Encounter for screening for malignant neoplasm of colon: Secondary | ICD-10-CM | POA: Insufficient documentation

## 2024-05-04 DIAGNOSIS — E119 Type 2 diabetes mellitus without complications: Secondary | ICD-10-CM | POA: Diagnosis not present

## 2024-05-04 DIAGNOSIS — K222 Esophageal obstruction: Secondary | ICD-10-CM | POA: Insufficient documentation

## 2024-05-04 DIAGNOSIS — I252 Old myocardial infarction: Secondary | ICD-10-CM | POA: Diagnosis not present

## 2024-05-04 DIAGNOSIS — K5730 Diverticulosis of large intestine without perforation or abscess without bleeding: Secondary | ICD-10-CM | POA: Insufficient documentation

## 2024-05-04 DIAGNOSIS — D123 Benign neoplasm of transverse colon: Secondary | ICD-10-CM | POA: Diagnosis not present

## 2024-05-04 DIAGNOSIS — D120 Benign neoplasm of cecum: Secondary | ICD-10-CM | POA: Insufficient documentation

## 2024-05-04 DIAGNOSIS — K449 Diaphragmatic hernia without obstruction or gangrene: Secondary | ICD-10-CM | POA: Diagnosis not present

## 2024-05-04 DIAGNOSIS — K641 Second degree hemorrhoids: Secondary | ICD-10-CM | POA: Insufficient documentation

## 2024-05-04 DIAGNOSIS — Z955 Presence of coronary angioplasty implant and graft: Secondary | ICD-10-CM | POA: Diagnosis not present

## 2024-05-04 DIAGNOSIS — K317 Polyp of stomach and duodenum: Secondary | ICD-10-CM | POA: Insufficient documentation

## 2024-05-04 DIAGNOSIS — I2510 Atherosclerotic heart disease of native coronary artery without angina pectoris: Secondary | ICD-10-CM | POA: Diagnosis not present

## 2024-05-04 DIAGNOSIS — D124 Benign neoplasm of descending colon: Secondary | ICD-10-CM | POA: Insufficient documentation

## 2024-05-04 DIAGNOSIS — D122 Benign neoplasm of ascending colon: Secondary | ICD-10-CM | POA: Diagnosis not present

## 2024-05-04 DIAGNOSIS — Z794 Long term (current) use of insulin: Secondary | ICD-10-CM | POA: Diagnosis not present

## 2024-05-04 DIAGNOSIS — I10 Essential (primary) hypertension: Secondary | ICD-10-CM | POA: Insufficient documentation

## 2024-05-04 DIAGNOSIS — K219 Gastro-esophageal reflux disease without esophagitis: Secondary | ICD-10-CM | POA: Diagnosis not present

## 2024-05-04 HISTORY — DX: Unspecified osteoarthritis, unspecified site: M19.90

## 2024-05-04 HISTORY — DX: Rheumatoid arthritis with rheumatoid factor, unspecified: M05.9

## 2024-05-04 LAB — GLUCOSE, CAPILLARY: Glucose-Capillary: 144 mg/dL — ABNORMAL HIGH (ref 70–99)

## 2024-05-04 MED ORDER — LIDOCAINE HCL (PF) 2 % IJ SOLN
INTRAMUSCULAR | Status: DC | PRN
  Administered 2024-05-04: 100 mg via INTRADERMAL

## 2024-05-04 MED ORDER — DEXMEDETOMIDINE HCL IN NACL 80 MCG/20ML IV SOLN
INTRAVENOUS | Status: DC | PRN
  Administered 2024-05-04: 16 ug via INTRAVENOUS

## 2024-05-04 MED ORDER — PROPOFOL 500 MG/50ML IV EMUL
INTRAVENOUS | Status: DC | PRN
  Administered 2024-05-04: 150 ug/kg/min via INTRAVENOUS
  Administered 2024-05-04: 70 mg via INTRAVENOUS

## 2024-05-04 MED ORDER — SODIUM CHLORIDE 0.9 % IV SOLN
INTRAVENOUS | Status: DC
  Administered 2024-05-04: 20 mL/h via INTRAVENOUS

## 2024-05-04 NOTE — H&P (Signed)
 Outpatient short stay form Pre-procedure 05/04/2024 9:18 AM Elaine Cox, M.D.  Primary Physician: Elaine Cox, M.D.  Reason for visit:  Esophageal dysmotility, Esophageal dysphagia, Schatzki Ring, Esophageal stricture, Hx adenomatous colon polyps (2019).  History of present illness:    Elaine Cox presents to the [facility] GI clinic for follow-up appointment to discuss scheduling endoscopy and colonoscopy. She was recently hospitalized at [facility] 12/29 - 04/06/2024 for progressive dysphagia and refractory GERD. Her primary care physician ordered a barium swallow which commented on distal esophageal stricture. Dr. Onita saw patient as consult and recommended. EGD performed 12/30 showed a widely patent Schatzki's ring and stenosis at appx 15 cm from incisors. She did have oxygen desaturation due to frequent coughing after sedation prior to placement of endoscope. Due to the patient's respiratory instability as well as frequent coughing and proximal location of the stricture, it was felt safer to allow the patient to recover from her respiratory illness and plan to follow-up as outpatient to discuss repeat EGD with dilatation with possible intubation for airway protection. Dr. Onita mentioned the endoscope therapeutically dilated the upper esophageal stenosis. She was told to take PPI BID and prescribed Tessalon  Perles and viscous lidocaine  for cough and throat pain. She was advised to stick to liquid diet until further dilation.   She presents to the clinic this morning by herself. She endorses continued dysphagia symptoms. She has continued to follow a liquid diet. She doesn't feel like she could eat anything solid due to fear of it getting stuck in her chest. She localizes source of dysphagia to suprasternal notch. Liquids seem to be going down her esophagus slowly. GERD symptoms are not well-controlled on regimen of Protonix  20 mg twice daily. She has been taking evening dose 2 hours after dinner.  Morning dose has been taken before breakfast. She has also been using Pepcid  OTC for nighttime symptoms. She has been on multiple different PPIs over the years. She denies any regurgitation of food boluses. She denies any complaints of odynophagia, epigastric abdominal pain, or hoarseness. She denies any loss of appetite or unintentional weight loss. Bowels are moving well without any bloody or tarry stools. She takes daily stool softener. She was taken off Clopidogrel  and only takes baby aspirin  for chronic antiplatelet therapy.    Current Medications[1]  Medications Prior to Admission  Medication Sig Dispense Refill Last Dose/Taking   ACCU-CHEK GUIDE test strip    05/03/2024   acetaminophen  (TYLENOL ) 325 MG tablet Take 2 tablets (650 mg total) by mouth every 6 (six) hours as needed for mild pain (or Fever >/= 101). (Patient taking differently: Take 1,000 mg by mouth every 6 (six) hours as needed for mild pain (pain score 1-3) (or Fever >/= 101).)   05/03/2024   Alcohol  Swabs (B-D SINGLE USE SWABS REGULAR) PADS    05/03/2024   ALPRAZolam  (XANAX ) 0.25 MG tablet Take 0.25 mg by mouth daily.   05/03/2024   amLODipine  (NORVASC ) 10 MG tablet Take 10 mg by mouth daily.   05/03/2024   aspirin  EC 81 MG EC tablet Take 1 tablet (81 mg total) by mouth daily. Swallow whole.   05/03/2024   Biotin 5000 MCG TABS Take 10,000 mcg by mouth daily.   05/03/2024   Blood Glucose Calibration (TRUE METRIX LEVEL 1) Low SOLN    05/03/2024   chlorpheniramine-HYDROcodone  (TUSSIONEX) 10-8 MG/5ML Take 5 mLs by mouth every 12 (twelve) hours as needed for cough. 20 mL 0 05/03/2024   Cholecalciferol (D 1000) 25 MCG (1000  UT) capsule Take 1,000 Units by mouth daily.   05/03/2024   LANTUS  SOLOSTAR 100 UNIT/ML Solostar Pen 15 Units daily.   05/04/2024 Morning   metoprolol  succinate (TOPROL -XL) 50 MG 24 hr tablet Take 50 mg by mouth daily.   05/03/2024   pantoprazole  (PROTONIX ) 20 MG tablet Take 1 tablet (20 mg total) by mouth 2 (two) times  daily before meals Take 30 minutes before meals and other medications.   05/03/2024   predniSONE  (DELTASONE ) 5 MG tablet Take 7.25 mg by mouth daily with breakfast.   05/03/2024   Probiotic Product (ALIGN) 4 MG CAPS Take 8 mg by mouth daily.   05/03/2024   rosuvastatin  (CRESTOR ) 5 MG tablet Take 5 mg by mouth daily.   05/03/2024   ferrous sulfate  325 (65 FE) MG tablet Take 1 tablet (325 mg total) by mouth daily with breakfast. (Patient not taking: Reported on 04/27/2024)   Not Taking     Allergies[2]   Past Medical History:  Diagnosis Date   Anginal pain 06/2016   being followed by dr. bosie   Anxiety    Arthritis    Osteoarthritis   Arthritis    Rheumatoid   Breast cancer (HCC) 2000   Right Breast - Chemotherapy   Collagen vascular disease    Rheumatoid Arthritis.   Coronary artery disease    Diabetes mellitus without complication [facility])    Patient takes Metformin    Dyspnea 03/2017   GERD (gastroesophageal reflux disease)    History of abdominal hysterectomy    History of eyelid surgery    History of kidney stones    Hyperlipidemia    Hypertension    Lumbago    Murmur    Myocardial infarction (HCC) 07/2010   Orthopnea 03/2017   Osteoarthritis    Personal history of chemotherapy 2000   right breast ca   Seropositive rheumatoid arthritis (HCC)    Sinus disorder    Stroke (HCC) 2006, 2012   Wears dentures    full upper and lower    Review of systems:  Otherwise negative.    Physical Exam  Gen: Alert, oriented. Appears stated age.  HEENT: [place]/AT. PERRLA. Lungs: CTA, no wheezes. CV: RR nl S1, S2. Abd: soft, benign, no masses. BS+ Ext: No edema. Pulses 2+    Planned procedures: Proceed with EGD and colonoscopy. The patient understands the nature of the planned procedure, indications, risks, alternatives and potential complications including but not limited to bleeding, infection, perforation, damage to internal organs and possible oversedation/side effects from  anesthesia. The patient agrees and gives consent to proceed.  Please refer to procedure notes for findings, recommendations and patient disposition/instructions.     Breena Bevacqua K. Cox, M.D. Gastroenterology 05/04/2024  9:18 AM          [1]  Current Facility-Administered Medications:    0.9 %  sodium chloride  infusion, , Intravenous, Continuous, Patty Lopezgarcia K, MD [2]  Allergies Allergen Reactions   Doxazosin Other (See Comments)    headache   Fish Allergy Anaphylaxis, Itching, Nausea Only and Swelling    She states her tongue turns black.    Dicloxacillin Nausea And Vomiting    Has patient had a PCN reaction causing immediate rash, facial/tongue/throat swelling, SOB or lightheadedness with hypotension: No Has patient had a PCN reaction causing severe rash involving mucus membranes or skin necrosis: No Has patient had a PCN reaction that required hospitalization: Unknown Has patient had a PCN reaction occurring within the last 10 years: Unknown If all  of the above answers are NO, then may proceed with Cephalosporin use.   Iodine Hives    Per patient, she had a previous reaction/hives from an iodine injection   Sulfa Antibiotics Swelling    Loss of appetite   Constipation bloating Other reaction(s): Headache    Cardura [Doxazosin Mesylate]     Headache    Ceftin  [Cefuroxime ] Hives   Etodolac Itching and Swelling   Sulfamethoxazole-Trimethoprim Nausea Only   Zofran  [Ondansetron ]    Azithromycin Itching   Ciprofloxacin  Nausea Only    Pt states she has never had a rash    Clopidogrel  Nausea And Vomiting   Colchicine Nausea Only and Other (See Comments)    Dizzness   Glucosamine Nausea And Vomiting and Other (See Comments)   Leflunomide Diarrhea and Itching   Meloxicam Itching   Methotrexate Nausea Only   Zoloft [Sertraline] Nausea And Vomiting

## 2024-05-04 NOTE — Anesthesia Postprocedure Evaluation (Signed)
"   Anesthesia Post Note  Patient: Elaine Cox  Procedure(s) Performed: COLONOSCOPY EGD (ESOPHAGOGASTRODUODENOSCOPY) BIOPSY, GI POLYPECTOMY, INTESTINE CONTROL OF HEMORRHAGE, GI TRACT, ENDOSCOPIC, BY CLIPPING OR OVERSEWING  Patient location during evaluation: Endoscopy Anesthesia Type: General Level of consciousness: awake and alert Pain management: pain level controlled Vital Signs Assessment: post-procedure vital signs reviewed and stable Respiratory status: spontaneous breathing, nonlabored ventilation, respiratory function stable and patient connected to nasal cannula oxygen Cardiovascular status: blood pressure returned to baseline and stable Postop Assessment: no apparent nausea or vomiting Anesthetic complications: no   No notable events documented.   Last Vitals:  Vitals:   05/04/24 1119 05/04/24 1129  BP: (!) 169/75 131/72  Pulse: 66 69  Resp: 16 15  Temp:    SpO2: 97% 100%    Last Pain:  Vitals:   05/04/24 1119  TempSrc:   PainSc: 0-No pain                 Prentice Murphy      "

## 2024-05-04 NOTE — Interval H&P Note (Signed)
 History and Physical Interval Note:  05/04/2024 9:20 AM  Elaine Cox  has presented today for surgery, with the diagnosis of Esophageal dysphagia (R13.19) Esophageal stricture (K22.2) Schatzki's ring (K22.2) Esophageal dysmotility (K22.4) Hx of adenomatous colonic polyps (Z86.0101).  The various methods of treatment have been discussed with the patient and family. After consideration of risks, benefits and other options for treatment, the patient has consented to  Procedures: COLONOSCOPY (N/A) EGD (ESOPHAGOGASTRODUODENOSCOPY) (N/A) as a surgical intervention.  The patient's history has been reviewed, patient examined, no change in status, stable for surgery.  I have reviewed the patient's chart and labs.  Questions were answered to the patient's satisfaction.     [place], Leonard Feigel

## 2024-05-04 NOTE — Transfer of Care (Signed)
 Immediate Anesthesia Transfer of Care Note  Patient: Elaine Cox  Procedure(s) Performed: COLONOSCOPY EGD (ESOPHAGOGASTRODUODENOSCOPY) BIOPSY, GI POLYPECTOMY, INTESTINE CONTROL OF HEMORRHAGE, GI TRACT, ENDOSCOPIC, BY CLIPPING OR OVERSEWING  Patient Location: Endoscopy Unit  Anesthesia Type:General  Level of Consciousness: drowsy  Airway & Oxygen Therapy: Patient Spontanous Breathing  Post-op Assessment: Report given to RN and Post -op Vital signs reviewed and stable  Post vital signs: Reviewed and stable  Last Vitals:  Vitals Value Taken Time  BP 144/60 05/04/24 11:01  Temp    Pulse 62 05/04/24 11:01  Resp 17 05/04/24 11:01  SpO2 97 % 05/04/24 11:01  Vitals shown include unfiled device data.  Last Pain:  Vitals:   05/04/24 0908  TempSrc: Temporal  PainSc: 0-No pain         Complications: No notable events documented.

## 2024-05-04 NOTE — Anesthesia Preprocedure Evaluation (Signed)
 "                                  Anesthesia Evaluation  Patient identified by MRN, date of birth, ID band Patient awake    Reviewed: Allergy & Precautions, H&P , NPO status , Patient's Chart, lab work & pertinent test results  History of Anesthesia Complications Negative for: history of anesthetic complications  Airway Mallampati: I  TM Distance: >3 FB Neck ROM: full    Dental  (+) Edentulous Lower, Edentulous Upper, Dental Advidsory Given   Pulmonary neg shortness of breath, neg COPD, Recent URI , Resolved   Pulmonary exam normal        Cardiovascular hypertension, (-) angina + CAD, + Past MI (2012) and + Cardiac Stents (2019)  Normal cardiovascular exam(-) dysrhythmias + Valvular Problems/Murmurs   LHC 2024: 1.  Two-vessel coronary artery disease with 80% stenosis proximal left circumflex, 75% stenosis distal RCA 2.  Normal left ventricular function 3.  Successful PCI with DES small left circumflex with balloon angioplasty ostium OM1    Neuro/Psych neg Seizures PSYCHIATRIC DISORDERS Anxiety     CVA (2006, 2012), Residual Symptoms    GI/Hepatic Neg liver ROS,GERD  ,,Intractable nausea and vomiting Known distal esophageal stricture Longstanding GERD    Endo/Other  diabetes    Renal/GU negative Renal ROS  negative genitourinary   Musculoskeletal  (+) Arthritis , Rheumatoid disorders,    Abdominal  (+) + obese  Peds  Hematology negative hematology ROS (+)   Anesthesia Other Findings Dysphagia  Per Cardiology 12/25: Chest pain presents as atypical, with epigastric tenderness on palpation and s/s c/w GERD. EKG nonischemic. On chronic steroids and DAPT. S/P DES to LCX 03/25/23. 2D ECHO 04/2023 stable when compared to 2022 ECHO. Normal systolic function. Mild valvular insufficiency without valvular stenosis. Pt denies dyspnea. Euvolemic on exam. She had a 72-hour Holter monitor 04/02/2023 - 04/05/2023 revealed predominant sinus rhythm with mean heart  rate 68 bpm, sinus heart rate range 50 to 113 bpm. 27% sinus bradycardia. Frequent premature atrial contractions (2%). Rare premature ventricular contraction. Occasional brief runs of SVT longest lasting 12 seconds. One 7 beat run of wide-complex tachycardia, likely SVT with aberrancy. One episode of A-V dissociation at 62 bpm of uncertain clinical significance. Dizziness and dyspnea previously improved with stopping Imdur . EKG today reveals sinus rhythm, HR 85 bpm. Pt is without palpitations.    - Continue metoprolol  XL 50 mg qd. Consider dose increase at follow up visit. HR 80-90s at most recent visits. Holter with average HR 68 and 27% bradycardia.    - Continue CAD medical management with aspirin , metoprolol  XL, rosuvastatin . Stop clopidogrel  after completing almost 12 months of DAPT s/p coronary stent, with current reflux s/s and chronic steroid use. Discussed continuing clopidogrel  and stopping aspirin  with patient h/o coronary stents and CVA. Pt prefers to continue aspirin  only. Advised f/u for any exertional chest pain or dyspnea.    - Continue current antihypertensive regimen. BP at goal. No changes today.    - Continue following with PCP for T2DM management.   - H/O CVA - on antiplatelet and statin therapy.    - Follow up with PCP this week for GERD management.    Return in about 4 months (around 07/14/2024) for Dr. Ammon   Past Medical History: 06/2016: Anginal pain     Comment:  being followed by dr. bosie No date: Anxiety No date: Arthritis  Comment:  Osteoarthritis No date: Arthritis     Comment:  Rheumatoid 2000: Breast cancer (HCC)     Comment:  Right Breast - Chemotherapy No date: Collagen vascular disease     Comment:  Rheumatoid Arthritis. No date: Coronary artery disease No date: Diabetes mellitus without complication [facility])     Comment:  Patient takes Metformin  03/2017: Dyspnea No date: GERD (gastroesophageal reflux disease) No date: History of abdominal  hysterectomy No date: History of eyelid surgery No date: History of kidney stones No date: Hyperlipidemia No date: Hypertension No date: Lumbago No date: Murmur 07/2010: Myocardial infarction (HCC) 03/2017: Orthopnea 2000: Personal history of chemotherapy     Comment:  right breast ca No date: Sinus disorder 2006, 2012: Stroke (HCC) No date: Wears dentures     Comment:  full upper and lower  Past Surgical History: No date: ABDOMINAL HYSTERECTOMY No date: BREAST SURGERY; Right     Comment:  mastectomy  2012: CARDIAC CATHETERIZATION     Comment:  stent placed 06/24/2017: CATARACT EXTRACTION W/PHACO; Right     Comment:  Procedure: CATARACT EXTRACTION PHACO AND INTRAOCULAR               LENS PLACEMENT (IOC) COMPLICATED RIGHT DIABETIC;                Surgeon: Mittie Gaskin, MD;  Location: [facility]               SURGERY CN[facility];  Service: Ophthalmology;  Laterality:               Right;  Diabetes - oral med No date: COLONOSCOPY 12/08/2017: COLONOSCOPY WITH PROPOFOL ; N/A     Comment:  Procedure: COLONOSCOPY WITH PROPOFOL ;  Surgeon: [place],               Ladell POUR, MD;  Location: [facility] ENDOSCOPY;  Service:               Gastroenterology;  Laterality: N/A; No date: CORONARY ANGIOPLASTY 02/24/2018: CORONARY STENT INTERVENTION; N/A     Comment:  Procedure: CORONARY STENT INTERVENTION;  Surgeon:               Ammon Blunt, MD;  Location: [facility] INVASIVE CV               LAB;  Service: Cardiovascular;  Laterality: N/A; 03/25/2023: CORONARY STENT INTERVENTION; N/A     Comment:  Procedure: CORONARY STENT INTERVENTION;  Surgeon:               Ammon Blunt, MD;  Location: [facility] INVASIVE CV               LAB;  Service: Cardiovascular;  Laterality: N/A; 09/08/2016: CYSTOSCOPY; N/A     Comment:  Procedure: CYSTOSCOPY;  Surgeon: Ward, Mitzie BROCKS, MD;                Location: [facility] ORS;  Service: Gynecology;  Laterality:               N/A; No date: EYE SURGERY; Left     Comment:   Cataract Extraction with IOL 03/14/2020: KNEE ARTHROPLASTY; Right     Comment:  Procedure: COMPUTER ASSISTED TOTAL KNEE ARTHROPLASTY;                Surgeon: Mardee Lynwood SQUIBB, MD;  Location: [facility] ORS;                Service: Orthopedics;  Laterality: Right;  FOLLOWING 1ST  CASE No date: KNEE ARTHROSCOPY W/ MENISCAL REPAIR; Right 09/08/2016: LAPAROSCOPIC BILATERAL SALPINGO OOPHERECTOMY; Bilateral     Comment:  Procedure: LAPAROSCOPIC BILATERAL SALPINGO OOPHORECTOMY;              Surgeon: Ward, Mitzie BROCKS, MD;  Location: [facility] ORS;                Service: Gynecology;  Laterality: Bilateral; 02/24/2018: LEFT HEART CATH AND CORONARY ANGIOGRAPHY; Left     Comment:  Procedure: LEFT HEART CATH AND CORONARY ANGIOGRAPHY;                Surgeon: Ammon Blunt, MD;  Location: [facility]               INVASIVE CV LAB;  Service: Cardiovascular;  Laterality:               Left; 03/25/2023: LEFT HEART CATH AND CORONARY ANGIOGRAPHY; Left     Comment:  Procedure: LEFT HEART CATH AND CORONARY ANGIOGRAPHY;                Surgeon: Ammon Blunt, MD;  Location: [facility]               INVASIVE CV LAB;  Service: Cardiovascular;  Laterality:               Left; 2000: MASTECTOMY; Right     Comment:  BREAST CA  BMI    Body Mass Index: 31.46 kg/m      Reproductive/Obstetrics negative OB ROS                              Anesthesia Physical Anesthesia Plan  ASA: 3  Anesthesia Plan: General   Post-op Pain Management: Minimal or no pain anticipated   Induction: Intravenous  PONV Risk Score and Plan: Propofol  infusion and TIVA  Airway Management Planned: Natural Airway and Nasal Cannula  Additional Equipment:   Intra-op Plan:   Post-operative Plan:   Informed Consent: I have reviewed the patients History and Physical, chart, labs and discussed the procedure including the risks, benefits and alternatives for the proposed anesthesia with the patient or  authorized representative who has indicated his/her understanding and acceptance.     Dental Advisory Given  Plan Discussed with: CRNA and Surgeon  Anesthesia Plan Comments:          Anesthesia Quick Evaluation  "

## 2024-05-04 NOTE — Op Note (Signed)
 [facility] Patient Name: Elaine Cox Procedure Date: 05/04/2024 9:55 AM MRN: [id number] Account #: [id number] Date of Birth: 09/03/1945 Admit Type: Outpatient Age: 79 Room: [facility] ENDO ROOM 2 Gender: Female Note Status: Finalized Instrument Name: Colon Scope [phone] Procedure:             Colonoscopy Indications:           High risk colon cancer surveillance: Personal history                         of multiple (3 or more) adenomas Providers:             Pia Jedlicka K. Aundria MD, MD Referring MD:          Tamra Leventhal, MD (Referring MD) Medicines:             Propofol  per Anesthesia Complications:         No immediate complications. Estimated blood loss:                         Minimal. Procedure:             Pre-Anesthesia Assessment:                        - The risks and benefits of the procedure and the                         sedation options and risks were discussed with the                         patient. All questions were answered and informed                         consent was obtained.                        - Patient identification and proposed procedure were                         verified prior to the procedure by the nurse. The                         procedure was verified in the procedure room.                        - ASA Grade Assessment: III - A patient with severe                         systemic disease.                        - After reviewing the risks and benefits, the patient                         was deemed in satisfactory condition to undergo the                         procedure.                        After obtaining  informed consent, the colonoscope was                         passed under direct vision. Throughout the procedure,                         the patient's blood pressure, pulse, and oxygen                         saturations were monitored continuously. The                         Colonoscope was  introduced through the anus and                         advanced to the the cecum, identified by appendiceal                         orifice and ileocecal valve. The colonoscopy was                         performed without difficulty. The patient tolerated                         the procedure well. The quality of the bowel                         preparation was good. The ileocecal valve, appendiceal                         orifice, and rectum were photographed. Findings:      The perianal and digital rectal examinations were normal. Pertinent       negatives include normal sphincter tone and no palpable rectal lesions.      Non-bleeding internal hemorrhoids were found during retroflexion. The       hemorrhoids were Grade II (internal hemorrhoids that prolapse but reduce       spontaneously).      A few medium-mouthed diverticula were found in the sigmoid colon.      Four sessile polyps were found in the cecum. The polyps were 3 to 7 mm       in size. These polyps were removed with a cold snare. Resection and       retrieval were complete. Estimated blood loss was minimal.      An 11 mm polyp was found in the cecum. The polyp was semi-pedunculated.       The polyp was removed with a hot snare. Resection and retrieval were       complete. Estimated blood loss: none.      Two semi-pedunculated polyps were found in the ascending colon. The       polyps were 10 to 12 mm in size. These polyps were removed with a hot       snare. Resection and retrieval were complete. Estimated blood loss:       none. To prevent bleeding after the polypectomy, two hemostatic clips       were successfully placed (MR conditional). Clip manufacturer: [employer]. There was no bleeding during, or at the end, of the       procedure.  Though clips were adequately placed, deployment was difficult       due to equipment malfunction. Detachment of the clip from the catheter       was accomplished without  complication with repeated ' to and fro 'motion       of the deployment mechanism. Estimated blood loss: none.      A 5 mm polyp was found in the ascending colon. The polyp was sessile.       The polyp was removed with a hot snare. Resection and retrieval were       complete. Estimated blood loss: none.      A 6 mm polyp was found in the transverse colon. The polyp was sessile.       The polyp was removed with a cold snare. Resection and retrieval were       complete. Estimated blood loss was minimal.      An 8 mm polyp was found in the descending colon. The polyp was sessile.       The polyp was removed with a cold snare. Resection and retrieval were       complete. Estimated blood loss was minimal.      The exam was otherwise without abnormality. Impression:            - Non-bleeding internal hemorrhoids.                        - Diverticulosis in the sigmoid colon.                        - Four 3 to 7 mm polyps in the cecum, removed with a                         cold snare. Resected and retrieved.                        - One 11 mm polyp in the cecum, removed with a hot                         snare. Resected and retrieved.                        - Two 10 to 12 mm polyps in the ascending colon,                         removed with a hot snare. Resected and retrieved.                         Clips (MR conditional) were placed. Clip manufacturer:                         [employer].                        - One 5 mm polyp in the ascending colon, removed with                         a hot snare. Resected and retrieved.                        - One 6  mm polyp in the transverse colon, removed with                         a cold snare. Resected and retrieved.                        - One 8 mm polyp in the descending colon, removed with                         a cold snare. Resected and retrieved.                        - The examination was otherwise normal. Recommendation:        -  Patient has a contact number available for                         emergencies. The signs and symptoms of potential                         delayed complications were discussed with the patient.                         Return to normal activities tomorrow. Written                         discharge instructions were provided to the patient.                        - Resume previous diet.                        - Continue present medications.                        - Repeat colonoscopy is recommended for surveillance.                         The colonoscopy date will be determined after                         pathology results from today's exam become available                         for review.                        - Return to GI office in 3 months.                        - Telephone GI office to schedule appointment. Procedure Code(s):     --- Professional ---                        [phone], Colonoscopy, flexible; with removal of                         tumor(s), polyp(s), or other lesion(s) by snare                         technique Diagnosis Code(s):     ---  Professional ---                        K57.30, Diverticulosis of large intestine without                         perforation or abscess without bleeding                        D12.4, Benign neoplasm of descending colon                        D12.2, Benign neoplasm of ascending colon                        D12.3, Benign neoplasm of transverse colon (hepatic                         flexure or splenic flexure)                        D12.0, Benign neoplasm of cecum                        K64.1, Second degree hemorrhoids                        Z86.010, Personal history of colonic polyps CPT copyright 2022 [employer]. All rights reserved. The codes documented in this report are preliminary and upon coder review may  be revised to meet current compliance requirements. Ladell MARLA Boss MD, MD 05/04/2024 11:05:00  AM This report has been signed electronically. Number of Addenda: 0 Note Initiated On: 05/04/2024 9:55 AM Scope Withdrawal Time: 0 hours 24 minutes 9 seconds  Total Procedure Duration: 0 hours 27 minutes 33 seconds  Estimated Blood Loss:  Estimated blood loss was minimal.      [facility]

## 2024-05-04 NOTE — Op Note (Signed)
 [facility] Patient Name: Elaine Cox Procedure Date: 05/04/2024 9:56 AM MRN: [id number] Account #: [id number] Date of Birth: 10-25-1945 Admit Type: Outpatient Age: 79 Room: [facility] ENDO ROOM 2 Gender: Female Note Status: Finalized Instrument Name: Endoscope [id number] Procedure:             Upper GI endoscopy Indications:           Esophageal dysphagia, Esophageal dysmotility, Schatzki                         Ring, GERD Providers:             Tametha Banning K. Aundria MD, MD Referring MD:          Tamra Leventhal, MD (Referring MD) Medicines:             Propofol  per Anesthesia Complications:         No immediate complications. Estimated blood loss:                         Minimal. Procedure:             Pre-Anesthesia Assessment:                        - The risks and benefits of the procedure and the                         sedation options and risks were discussed with the                         patient. All questions were answered and informed                         consent was obtained.                        - Patient identification and proposed procedure were                         verified prior to the procedure by the nurse. The                         procedure was verified in the procedure room.                        - ASA Grade Assessment: III - A patient with severe                         systemic disease.                        - After reviewing the risks and benefits, the patient                         was deemed in satisfactory condition to undergo the                         procedure.                        After obtaining informed consent, the endoscope was  passed under direct vision. Throughout the procedure,                         the patient's blood pressure, pulse, and oxygen                         saturations were monitored continuously. The Endoscope                         was introduced through  the mouth, and advanced to the                         third part of duodenum. The upper GI endoscopy was                         accomplished without difficulty. The patient tolerated                         the procedure well. Findings:      Moderate tortuosity of the mid to distal esophagus was noted compatible       with a diagnosis of Presbyesophagus.      There is no endoscopic evidence of stenosis or stricture in the entire       esophagus.      The exam of the esophagus was otherwise normal.      A 1 cm hiatal hernia was present.      Multiple medium pedunculated and sessile polyps with no bleeding and no       stigmata of recent bleeding were found in the gastric body. Biopsies       were taken with a cold forceps for histology. Estimated blood loss was       minimal.      The exam of the stomach was otherwise normal.      The examined duodenum was normal. Impression:            - 1 cm hiatal hernia.                        - Multiple gastric polyps. Biopsied.                        - Normal examined duodenum. Recommendation:        - Await pathology results.                        - Proceed with colonoscopy Procedure Code(s):     --- Professional ---                        [phone], Esophagogastroduodenoscopy, flexible,                         transoral; with biopsy, single or multiple Diagnosis Code(s):     --- Professional ---                        R13.14, Dysphagia, pharyngoesophageal phase                        K31.7, Polyp of stomach and duodenum  K44.9, Diaphragmatic hernia without obstruction or                         gangrene CPT copyright 2022 American [employer] Association. All rights reserved. The codes documented in this report are preliminary and upon coder review may  be revised to meet current compliance requirements. Ladell MARLA Boss MD, MD 05/04/2024 10:25:24 AM This report has been signed electronically. Number of Addenda: 0 Note  Initiated On: 05/04/2024 9:56 AM Estimated Blood Loss:  Estimated blood loss was minimal.      [facility]

## 2024-05-05 LAB — SURGICAL PATHOLOGY

## 2025-03-23 ENCOUNTER — Ambulatory Visit: Admitting: Urology
